# Patient Record
Sex: Female | Born: 1985 | Race: White | Hispanic: No | Marital: Single | Smoking: Current every day smoker
Health system: Southern US, Community
[De-identification: ages and names within clinical notes are randomized; demographics above are authoritative.]

## PROBLEM LIST (undated history)

## (undated) DIAGNOSIS — F1990 Other psychoactive substance use, unspecified, uncomplicated: Secondary | ICD-10-CM

---

## 2018-06-10 NOTE — Congregational Nurse Program (Signed)
Voices no health concerns this visit 

## 2020-11-15 ENCOUNTER — Other Ambulatory Visit: Payer: Self-pay

## 2020-11-15 ENCOUNTER — Inpatient Hospital Stay (HOSPITAL_BASED_OUTPATIENT_CLINIC_OR_DEPARTMENT_OTHER)
Admission: EM | Admit: 2020-11-15 | Discharge: 2020-12-29 | DRG: 853 | Discharge status: Against Medical Advice | Payer: Self-pay | Attending: Internal Medicine | Admitting: Internal Medicine

## 2020-11-15 ENCOUNTER — Emergency Department (HOSPITAL_BASED_OUTPATIENT_CLINIC_OR_DEPARTMENT_OTHER): Payer: Self-pay

## 2020-11-15 ENCOUNTER — Encounter (HOSPITAL_BASED_OUTPATIENT_CLINIC_OR_DEPARTMENT_OTHER): Payer: Self-pay | Admitting: Emergency Medicine

## 2020-11-15 DIAGNOSIS — E875 Hyperkalemia: Secondary | ICD-10-CM | POA: Diagnosis not present

## 2020-11-15 DIAGNOSIS — D696 Thrombocytopenia, unspecified: Secondary | ICD-10-CM | POA: Diagnosis present

## 2020-11-15 DIAGNOSIS — M79672 Pain in left foot: Secondary | ICD-10-CM

## 2020-11-15 DIAGNOSIS — B49 Unspecified mycosis: Secondary | ICD-10-CM

## 2020-11-15 DIAGNOSIS — Z6824 Body mass index (BMI) 24.0-24.9, adult: Secondary | ICD-10-CM

## 2020-11-15 DIAGNOSIS — R0682 Tachypnea, not elsewhere classified: Secondary | ICD-10-CM

## 2020-11-15 DIAGNOSIS — E860 Dehydration: Secondary | ICD-10-CM | POA: Diagnosis present

## 2020-11-15 DIAGNOSIS — Z638 Other specified problems related to primary support group: Secondary | ICD-10-CM

## 2020-11-15 DIAGNOSIS — I129 Hypertensive chronic kidney disease with stage 1 through stage 4 chronic kidney disease, or unspecified chronic kidney disease: Secondary | ICD-10-CM | POA: Diagnosis present

## 2020-11-15 DIAGNOSIS — N170 Acute kidney failure with tubular necrosis: Secondary | ICD-10-CM | POA: Diagnosis present

## 2020-11-15 DIAGNOSIS — L0390 Cellulitis, unspecified: Secondary | ICD-10-CM

## 2020-11-15 DIAGNOSIS — M549 Dorsalgia, unspecified: Secondary | ICD-10-CM | POA: Diagnosis not present

## 2020-11-15 DIAGNOSIS — M00011 Staphylococcal arthritis, right shoulder: Secondary | ICD-10-CM

## 2020-11-15 DIAGNOSIS — N3000 Acute cystitis without hematuria: Secondary | ICD-10-CM

## 2020-11-15 DIAGNOSIS — K5903 Drug induced constipation: Secondary | ICD-10-CM | POA: Diagnosis present

## 2020-11-15 DIAGNOSIS — G43909 Migraine, unspecified, not intractable, without status migrainosus: Secondary | ICD-10-CM | POA: Diagnosis not present

## 2020-11-15 DIAGNOSIS — R7881 Bacteremia: Secondary | ICD-10-CM

## 2020-11-15 DIAGNOSIS — F919 Conduct disorder, unspecified: Secondary | ICD-10-CM | POA: Diagnosis not present

## 2020-11-15 DIAGNOSIS — E876 Hypokalemia: Secondary | ICD-10-CM | POA: Diagnosis present

## 2020-11-15 DIAGNOSIS — D62 Acute posthemorrhagic anemia: Secondary | ICD-10-CM | POA: Diagnosis not present

## 2020-11-15 DIAGNOSIS — B1920 Unspecified viral hepatitis C without hepatic coma: Secondary | ICD-10-CM | POA: Diagnosis present

## 2020-11-15 DIAGNOSIS — F32A Depression, unspecified: Secondary | ICD-10-CM | POA: Diagnosis present

## 2020-11-15 DIAGNOSIS — F17200 Nicotine dependence, unspecified, uncomplicated: Secondary | ICD-10-CM | POA: Diagnosis present

## 2020-11-15 DIAGNOSIS — Z20822 Contact with and (suspected) exposure to covid-19: Secondary | ICD-10-CM | POA: Diagnosis present

## 2020-11-15 DIAGNOSIS — I2690 Septic pulmonary embolism without acute cor pulmonale: Secondary | ICD-10-CM | POA: Diagnosis present

## 2020-11-15 DIAGNOSIS — D509 Iron deficiency anemia, unspecified: Secondary | ICD-10-CM

## 2020-11-15 DIAGNOSIS — M7989 Other specified soft tissue disorders: Secondary | ICD-10-CM | POA: Diagnosis not present

## 2020-11-15 DIAGNOSIS — Z5329 Procedure and treatment not carried out because of patient's decision for other reasons: Secondary | ICD-10-CM | POA: Diagnosis not present

## 2020-11-15 DIAGNOSIS — D6959 Other secondary thrombocytopenia: Secondary | ICD-10-CM | POA: Diagnosis present

## 2020-11-15 DIAGNOSIS — A4102 Sepsis due to Methicillin resistant Staphylococcus aureus: Principal | ICD-10-CM | POA: Diagnosis present

## 2020-11-15 DIAGNOSIS — F1490 Cocaine use, unspecified, uncomplicated: Secondary | ICD-10-CM | POA: Diagnosis present

## 2020-11-15 DIAGNOSIS — R6520 Severe sepsis without septic shock: Secondary | ICD-10-CM | POA: Diagnosis present

## 2020-11-15 DIAGNOSIS — R0600 Dyspnea, unspecified: Secondary | ICD-10-CM

## 2020-11-15 DIAGNOSIS — T40605A Adverse effect of unspecified narcotics, initial encounter: Secondary | ICD-10-CM | POA: Diagnosis present

## 2020-11-15 DIAGNOSIS — E871 Hypo-osmolality and hyponatremia: Secondary | ICD-10-CM | POA: Diagnosis present

## 2020-11-15 DIAGNOSIS — E8770 Fluid overload, unspecified: Secondary | ICD-10-CM | POA: Diagnosis not present

## 2020-11-15 DIAGNOSIS — I76 Septic arterial embolism: Secondary | ICD-10-CM

## 2020-11-15 DIAGNOSIS — N280 Ischemia and infarction of kidney: Secondary | ICD-10-CM | POA: Diagnosis present

## 2020-11-15 DIAGNOSIS — I079 Rheumatic tricuspid valve disease, unspecified: Secondary | ICD-10-CM

## 2020-11-15 DIAGNOSIS — N189 Chronic kidney disease, unspecified: Secondary | ICD-10-CM | POA: Diagnosis present

## 2020-11-15 DIAGNOSIS — B9562 Methicillin resistant Staphylococcus aureus infection as the cause of diseases classified elsewhere: Secondary | ICD-10-CM

## 2020-11-15 DIAGNOSIS — K219 Gastro-esophageal reflux disease without esophagitis: Secondary | ICD-10-CM | POA: Diagnosis present

## 2020-11-15 DIAGNOSIS — J189 Pneumonia, unspecified organism: Secondary | ICD-10-CM

## 2020-11-15 DIAGNOSIS — F1910 Other psychoactive substance abuse, uncomplicated: Secondary | ICD-10-CM | POA: Diagnosis present

## 2020-11-15 DIAGNOSIS — F1290 Cannabis use, unspecified, uncomplicated: Secondary | ICD-10-CM | POA: Diagnosis present

## 2020-11-15 DIAGNOSIS — E46 Unspecified protein-calorie malnutrition: Secondary | ICD-10-CM | POA: Diagnosis present

## 2020-11-15 DIAGNOSIS — A419 Sepsis, unspecified organism: Secondary | ICD-10-CM

## 2020-11-15 DIAGNOSIS — F1123 Opioid dependence with withdrawal: Secondary | ICD-10-CM | POA: Diagnosis present

## 2020-11-15 DIAGNOSIS — R5381 Other malaise: Secondary | ICD-10-CM | POA: Diagnosis present

## 2020-11-15 DIAGNOSIS — R109 Unspecified abdominal pain: Secondary | ICD-10-CM

## 2020-11-15 DIAGNOSIS — J9621 Acute and chronic respiratory failure with hypoxia: Secondary | ICD-10-CM

## 2020-11-15 DIAGNOSIS — E8809 Other disorders of plasma-protein metabolism, not elsewhere classified: Secondary | ICD-10-CM | POA: Diagnosis present

## 2020-11-15 DIAGNOSIS — Z8679 Personal history of other diseases of the circulatory system: Secondary | ICD-10-CM

## 2020-11-15 DIAGNOSIS — F17210 Nicotine dependence, cigarettes, uncomplicated: Secondary | ICD-10-CM | POA: Diagnosis present

## 2020-11-15 DIAGNOSIS — R161 Splenomegaly, not elsewhere classified: Secondary | ICD-10-CM | POA: Diagnosis present

## 2020-11-15 DIAGNOSIS — E43 Unspecified severe protein-calorie malnutrition: Secondary | ICD-10-CM | POA: Diagnosis present

## 2020-11-15 DIAGNOSIS — E440 Moderate protein-calorie malnutrition: Secondary | ICD-10-CM | POA: Insufficient documentation

## 2020-11-15 DIAGNOSIS — F419 Anxiety disorder, unspecified: Secondary | ICD-10-CM | POA: Diagnosis present

## 2020-11-15 DIAGNOSIS — L03116 Cellulitis of left lower limb: Secondary | ICD-10-CM | POA: Diagnosis present

## 2020-11-15 DIAGNOSIS — J188 Other pneumonia, unspecified organism: Secondary | ICD-10-CM | POA: Diagnosis present

## 2020-11-15 DIAGNOSIS — L02413 Cutaneous abscess of right upper limb: Secondary | ICD-10-CM | POA: Diagnosis present

## 2020-11-15 DIAGNOSIS — I330 Acute and subacute infective endocarditis: Secondary | ICD-10-CM | POA: Diagnosis present

## 2020-11-15 DIAGNOSIS — K089 Disorder of teeth and supporting structures, unspecified: Secondary | ICD-10-CM

## 2020-11-15 HISTORY — DX: Other psychoactive substance use, unspecified, uncomplicated: F19.90

## 2020-11-15 LAB — HEPATIC FUNCTION PANEL
ALT: 12 U/L (ref 0–44)
AST: 23 U/L (ref 15–41)
Albumin: 2.1 g/dL — ABNORMAL LOW (ref 3.5–5.0)
Alkaline Phosphatase: 74 U/L (ref 38–126)
Bilirubin, Direct: 0.6 mg/dL — ABNORMAL HIGH (ref 0.0–0.2)
Indirect Bilirubin: 0.3 mg/dL (ref 0.3–0.9)
Total Bilirubin: 0.9 mg/dL (ref 0.3–1.2)
Total Protein: 6.5 g/dL (ref 6.5–8.1)

## 2020-11-15 LAB — URINALYSIS, ROUTINE W REFLEX MICROSCOPIC
Bilirubin Urine: NEGATIVE
Glucose, UA: NEGATIVE mg/dL
Ketones, ur: NEGATIVE mg/dL
Nitrite: POSITIVE — AB
Protein, ur: 30 mg/dL — AB
Specific Gravity, Urine: 1.02 (ref 1.005–1.030)
pH: 6 (ref 5.0–8.0)

## 2020-11-15 LAB — BASIC METABOLIC PANEL
Anion gap: 15 (ref 5–15)
BUN: 40 mg/dL — ABNORMAL HIGH (ref 6–20)
CO2: 19 mmol/L — ABNORMAL LOW (ref 22–32)
Calcium: 7.7 mg/dL — ABNORMAL LOW (ref 8.9–10.3)
Chloride: 90 mmol/L — ABNORMAL LOW (ref 98–111)
Creatinine, Ser: 1.33 mg/dL — ABNORMAL HIGH (ref 0.44–1.00)
GFR, Estimated: 54 mL/min — ABNORMAL LOW (ref >60)
Glucose, Bld: 128 mg/dL — ABNORMAL HIGH (ref 70–99)
Potassium: 2.6 mmol/L — CL (ref 3.5–5.1)
Sodium: 124 mmol/L — ABNORMAL LOW (ref 135–145)

## 2020-11-15 LAB — URINALYSIS, MICROSCOPIC (REFLEX)

## 2020-11-15 LAB — MAGNESIUM: Magnesium: 1.8 mg/dL (ref 1.7–2.4)

## 2020-11-15 LAB — RAPID URINE DRUG SCREEN, HOSP PERFORMED
Amphetamines: NOT DETECTED
Barbiturates: NOT DETECTED
Benzodiazepines: NOT DETECTED
Cocaine: POSITIVE — AB
Opiates: NOT DETECTED
Tetrahydrocannabinol: NOT DETECTED

## 2020-11-15 LAB — CBC
HCT: 36.4 % (ref 36.0–46.0)
Hemoglobin: 13 g/dL (ref 12.0–15.0)
MCH: 27.5 pg (ref 26.0–34.0)
MCHC: 35.7 g/dL (ref 30.0–36.0)
MCV: 77.1 fL — ABNORMAL LOW (ref 80.0–100.0)
Platelets: 51 10*3/uL — ABNORMAL LOW (ref 150–400)
RBC: 4.72 MIL/uL (ref 3.87–5.11)
RDW: 15.3 % (ref 11.5–15.5)
WBC: 15.3 10*3/uL — ABNORMAL HIGH (ref 4.0–10.5)
nRBC: 0 % (ref 0.0–0.2)

## 2020-11-15 LAB — TROPONIN I (HIGH SENSITIVITY)
Troponin I (High Sensitivity): 13 ng/L (ref <18)
Troponin I (High Sensitivity): 12 ng/L (ref <18)

## 2020-11-15 LAB — RESP PANEL BY RT-PCR (FLU A&B, COVID) ARPGX2
Influenza A by PCR: NEGATIVE
Influenza B by PCR: NEGATIVE
SARS Coronavirus 2 by RT PCR: NEGATIVE

## 2020-11-15 LAB — CK: Total CK: 43 U/L (ref 38–234)

## 2020-11-15 LAB — LACTIC ACID, PLASMA
Lactic Acid, Venous: 3.6 mmol/L (ref 0.5–1.9)
Lactic Acid, Venous: 2.7 mmol/L (ref 0.5–1.9)

## 2020-11-15 LAB — LIPASE, BLOOD: Lipase: 20 U/L (ref 11–51)

## 2020-11-15 LAB — CBG MONITORING, ED: Glucose-Capillary: 145 mg/dL — ABNORMAL HIGH (ref 70–99)

## 2020-11-15 LAB — PREGNANCY, URINE: Preg Test, Ur: NEGATIVE

## 2020-11-15 MED ORDER — IOHEXOL 350 MG/ML SOLN
100.0000 mL | Freq: Once | INTRAVENOUS | Status: AC | PRN
  Administered 2020-11-15: 100 mL via INTRAVENOUS

## 2020-11-15 MED ORDER — POTASSIUM CHLORIDE 10 MEQ/100ML IV SOLN
10.0000 meq | INTRAVENOUS | Status: AC
  Administered 2020-11-15 – 2020-11-16 (×4): 10 meq via INTRAVENOUS
  Filled 2020-11-15 (×4): qty 100

## 2020-11-15 MED ORDER — SODIUM CHLORIDE 0.9 % IV SOLN
2.0000 g | Freq: Two times a day (BID) | INTRAVENOUS | Status: DC
  Administered 2020-11-15 – 2020-11-16 (×2): 2 g via INTRAVENOUS
  Filled 2020-11-15 (×2): qty 2

## 2020-11-15 MED ORDER — SODIUM CHLORIDE 0.9 % IV SOLN: Freq: Once | INTRAVENOUS | Status: AC

## 2020-11-15 MED ORDER — SODIUM CHLORIDE 0.9 % IV SOLN
INTRAVENOUS | Status: DC | PRN
  Administered 2020-11-15: 250 mL via INTRAVENOUS

## 2020-11-15 MED ORDER — VANCOMYCIN HCL 1250 MG/250ML IV SOLN
1250.0000 mg | INTRAVENOUS | Status: DC
  Filled 2020-11-15: qty 250

## 2020-11-15 MED ORDER — VANCOMYCIN HCL IN DEXTROSE 1-5 GM/200ML-% IV SOLN
1000.0000 mg | Freq: Once | INTRAVENOUS | Status: AC
  Administered 2020-11-15: 1000 mg via INTRAVENOUS
  Filled 2020-11-15: qty 200

## 2020-11-15 MED ORDER — POTASSIUM CHLORIDE CRYS ER 20 MEQ PO TBCR: 40.0000 meq | EXTENDED_RELEASE_TABLET | Freq: Once | ORAL | Status: DC

## 2020-11-15 MED ORDER — ONDANSETRON HCL 4 MG/2ML IJ SOLN
4.0000 mg | Freq: Once | INTRAMUSCULAR | Status: AC
  Administered 2020-11-15: 4 mg via INTRAVENOUS
  Filled 2020-11-15: qty 2

## 2020-11-15 MED ORDER — SODIUM CHLORIDE 0.9 % IV BOLUS
1000.0000 mL | Freq: Once | INTRAVENOUS | Status: AC
  Administered 2020-11-15: 1000 mL via INTRAVENOUS

## 2020-11-15 MED ORDER — VANCOMYCIN HCL 500 MG IV SOLR
INTRAVENOUS | Status: AC
  Filled 2020-11-15: qty 500

## 2020-11-15 MED ORDER — FENTANYL CITRATE (PF) 100 MCG/2ML IJ SOLN
50.0000 ug | Freq: Once | INTRAMUSCULAR | Status: AC
Start: 2020-11-15 — End: 2020-11-15
  Administered 2020-11-15: 50 ug via INTRAVENOUS
  Filled 2020-11-15: qty 2

## 2020-11-15 MED ORDER — VANCOMYCIN HCL 1500 MG/300ML IV SOLN: 1500.0000 mg | Freq: Once | INTRAVENOUS | Status: DC

## 2020-11-15 MED ORDER — VANCOMYCIN HCL 500 MG/100ML IV SOLN
500.0000 mg | Freq: Once | INTRAVENOUS | Status: AC
  Administered 2020-11-15: 500 mg via INTRAVENOUS
  Filled 2020-11-15: qty 100

## 2020-11-15 NOTE — Progress Notes (Signed)
Pharmacy Antibiotic Note  Rola Lennon is a 35 y.o. female admitted on 11/15/2020 with sepsis.  Pharmacy has been consulted for vancomycin/cefepime dosing.  Presenting with generalized body aches, cough, and nausea. WBC 15.2, LA 3.6, CK total 43, afebrile.   Plan: Vancomycin 1500 mg IV once then 1250 mg IV every 24 hours (est AUC 490, using Scr 1.33) Cefepime 2g IV every 12 hours Monitor renal fx, cx results, clinical pic, vanc levels as indicated   Height: 5\' 7"  (170.2 cm) Weight: 68 kg (150 lb) IBW/kg (Calculated) : 61.6  Temp (24hrs), Avg:98.9 F (37.2 C), Min:98.5 F (36.9 C), Max:99.2 F (37.3 C)  Recent Labs  Lab 11/15/20 1337 11/15/20 1735 11/15/20 1845  WBC 15.3*  --   --   CREATININE  --  1.33*  --   LATICACIDVEN  --   --  3.6*    Estimated Creatinine Clearance: 57.4 mL/min (A) (by C-G formula based on SCr of 1.33 mg/dL (H)).    No Known Allergies   Antimicrobials this admission: Cefepime 2/12 >>  Vancomycin 2/12 >>   Dose adjustments this admission: N/A  Microbiology results: 2/12 BCx: sent 2/12 COVID PCR: neg  Thank you for allowing pharmacy to be a part of this patient's care.  4/12, PharmD, BCCCP Clinical Pharmacist  Phone: [phone] 11/15/2020 7:38 PM  Please check AMION for all [facility] Pharmacy phone numbers After 10:00 PM, call Main Pharmacy [phone]

## 2020-11-15 NOTE — ED Notes (Signed)
Arterial stick done for labs per MD 

## 2020-11-15 NOTE — ED Notes (Signed)
Patient transported to CT 

## 2020-11-15 NOTE — ED Notes (Signed)
Attempted x 2 to left hand for labs, unsuccessful. Per ED MD ok'ed art stick for labs by RT

## 2020-11-15 NOTE — ED Provider Notes (Signed)
[facility] EMERGENCY DEPARTMENT Provider Note   CSN: [id number] Arrival date & time: 11/15/20  1208     History Chief Complaint  Patient presents with  . Generalized Body Aches    Anne Bautista is a 35 y.o. female.  The history is provided by the patient.  Weakness Severity:  Moderate Onset quality:  Gradual Timing:  Constant Progression:  Worsening Chronicity:  New Context: drug use (Hx of heroin use denies any recent use but states cough, left flank pain, SOB and total body weakness and dehydration for the past week.)   Relieved by:  Nothing Worsened by:  Nothing Associated symptoms: anorexia, cough, fever (said she had a 105 fever), lethargy, myalgias and shortness of breath   Associated symptoms: no abdominal pain, no arthralgias, no chest pain, no diarrhea, no dizziness, no dysuria, no seizures and no vomiting        History reviewed. No pertinent past medical history.  There are no problems to display for this patient.   History reviewed. No pertinent surgical history.   OB History   No obstetric history on file.     History reviewed. No pertinent family history.  Social History   Tobacco Use  . Smoking status: Never Smoker  Substance Use Topics  . Alcohol use: Never  . Drug use: Never    Home Medications Prior to Admission medications   Not on File    Allergies    Patient has no known allergies.  Review of Systems   Review of Systems  Constitutional: Positive for chills and fever (said she had a 105 fever).  HENT: Negative for ear pain and sore throat.   Eyes: Negative for pain and visual disturbance.  Respiratory: Positive for cough and shortness of breath.   Cardiovascular: Negative for chest pain and palpitations.  Gastrointestinal: Positive for anorexia. Negative for abdominal pain, diarrhea and vomiting.  Genitourinary: Negative for dysuria and hematuria.  Musculoskeletal: Positive for myalgias. Negative for arthralgias and  back pain.  Skin: Negative for color change and rash.  Neurological: Positive for weakness. Negative for dizziness, seizures and syncope.  All other systems reviewed and are negative.   Physical Exam Updated Vital Signs  ED Triage Vitals  Enc Vitals Group     BP 11/15/20 1227 130/88     Pulse Rate 11/15/20 1227 (!) 138     Resp 11/15/20 1227 (!) 30     Temp 11/15/20 1227 98.5 F (36.9 C)     Temp Source 11/15/20 1227 Oral     SpO2 11/15/20 1217 98 %     Weight 11/15/20 1227 150 lb (68 kg)     Height 11/15/20 1227 5\' 7"  (1.702 m)     Head Circumference --      Peak Flow --      Pain Score 11/15/20 1226 10     Pain Loc --      Pain Edu? --      Excl. in GC? --     Physical Exam Vitals and nursing note reviewed.  Constitutional:      General: She is in acute distress.     Appearance: She is well-developed and well-nourished. She is ill-appearing.  HENT:     Head: Normocephalic and atraumatic.     Mouth/Throat:     Mouth: Mucous membranes are dry.  Eyes:     Extraocular Movements: Extraocular movements intact.     Conjunctiva/sclera: Conjunctivae normal.     Pupils: Pupils are equal, round,  and reactive to light.  Cardiovascular:     Rate and Rhythm: Regular rhythm. Tachycardia present.     Heart sounds: No murmur heard.     Comments: No obvious murmur Pulmonary:     Effort: Pulmonary effort is normal. No respiratory distress.     Breath sounds: Normal breath sounds.  Abdominal:     General: There is no distension.     Palpations: Abdomen is soft.     Tenderness: There is no abdominal tenderness. There is no guarding.  Musculoskeletal:        General: Tenderness present. No edema.     Cervical back: Neck supple.     Comments: Tenderness to the left flank  Skin:    General: Skin is warm and dry.     Capillary Refill: Capillary refill takes less than 2 seconds.     Findings: No rash.  Neurological:     Mental Status: She is alert.     Comments: She appears to  have more generalized weakness and any focal weakness on exam able to move all of her extremities with very poor effort, normal sensation, cranial nerves appear intact, normal ocular ocular movements  Psychiatric:        Mood and Affect: Mood and affect normal.     ED Results / Procedures / Treatments   Labs (all labs ordered are listed, but only abnormal results are displayed) Labs Reviewed  CBC - Abnormal; Notable for the following components:      Result Value   WBC 15.3 (*)    MCV 77.1 (*)    Platelets 51 (*)    All other components within normal limits  URINALYSIS, ROUTINE W REFLEX MICROSCOPIC - Abnormal; Notable for the following components:   APPearance HAZY (*)    Hgb urine dipstick MODERATE (*)    Protein, ur 30 (*)    Nitrite POSITIVE (*)    Leukocytes,Ua TRACE (*)    All other components within normal limits  RAPID URINE DRUG SCREEN, HOSP PERFORMED - Abnormal; Notable for the following components:   Cocaine POSITIVE (*)    All other components within normal limits  BASIC METABOLIC PANEL - Abnormal; Notable for the following components:   Sodium 124 (*)    Potassium 2.6 (*)    Chloride 90 (*)    CO2 19 (*)    Glucose, Bld 128 (*)    BUN 40 (*)    Creatinine, Ser 1.33 (*)    Calcium 7.7 (*)    GFR, Estimated 54 (*)    All other components within normal limits  HEPATIC FUNCTION PANEL - Abnormal; Notable for the following components:   Albumin 2.1 (*)    Bilirubin, Direct 0.6 (*)    All other components within normal limits  LACTIC ACID, PLASMA - Abnormal; Notable for the following components:   Lactic Acid, Venous 3.6 (*)    All other components within normal limits  URINALYSIS, MICROSCOPIC (REFLEX) - Abnormal; Notable for the following components:   Bacteria, UA MANY (*)    All other components within normal limits  CBG MONITORING, ED - Abnormal; Notable for the following components:   Glucose-Capillary 145 (*)    All other components within normal limits   RESP PANEL BY RT-PCR (FLU A&B, COVID) ARPGX2  CULTURE, BLOOD (ROUTINE X 2)  CULTURE, BLOOD (ROUTINE X 2)  PREGNANCY, URINE  CK  LIPASE, BLOOD  MAGNESIUM  LACTIC ACID, PLASMA  TROPONIN I (HIGH SENSITIVITY)  TROPONIN I (HIGH  SENSITIVITY)    EKG EKG Interpretation  Date/Time:  Saturday November 15 2020 12:42:05 EST Ventricular Rate:  137 PR Interval:  118 QRS Duration: 86 QT Interval:  248 QTC Calculation: 374 R Axis:   63 Text Interpretation: Sinus tachycardia Nonspecific ST and T wave abnormality Abnormal ECG Confirmed by Virgina Norfolk [phone]) on 11/15/2020 3:25:24 PM   Radiology CT Head Wo Contrast  Result Date: 11/15/2020 CLINICAL DATA:  Classic migraine headache. EXAM: CT HEAD WITHOUT CONTRAST TECHNIQUE: Contiguous axial images were obtained from the base of the skull through the vertex without intravenous contrast. COMPARISON:  07/08/2015 FINDINGS: Brain: The brain shows a normal appearance without evidence of malformation, atrophy, old or acute small or large vessel infarction, mass lesion, hemorrhage, hydrocephalus or extra-axial collection. Vascular: No hyperdense vessel. No evidence of atherosclerotic calcification. Skull: Normal.  No traumatic finding.  No focal bone lesion. Sinuses/Orbits: Air-fluid level in the right maxillary sinus suggesting rhinosinusitis. Other visualized sinuses are clear. Orbits negative. Other: None significant IMPRESSION: 1. Normal appearance of the brain itself. 2. Air-fluid level in the right maxillary sinus suggesting rhinosinusitis. Electronically Signed   By: Paulina Fusi M.D.   On: 11/15/2020 20:45   CT Angio Chest PE W and/or Wo Contrast  Result Date: 11/15/2020 CLINICAL DATA:  Body aches, weakness, cough for 1 week EXAM: CT ANGIOGRAPHY CHEST WITH CONTRAST TECHNIQUE: Multidetector CT imaging of the chest was performed using the standard protocol during bolus administration of intravenous contrast. Multiplanar CT image reconstructions and  MIPs were obtained to evaluate the vascular anatomy. CONTRAST:  OMNIPAQUE IOHEXOL 350 MG/ML SOLN COMPARISON:  04/16/2016 FINDINGS: Cardiovascular: Satisfactory opacification of the pulmonary arteries to the segmental level. No evidence of pulmonary embolism. Normal heart size. No pericardial effusion. Mediastinum/Nodes: No enlarged mediastinal, hilar, or axillary lymph nodes. Thyroid gland, trachea, and esophagus demonstrate no significant findings. Lungs/Pleura: There are numerous bilateral cavitary nodules throughout the lungs. Interlobular septal thickening. Small left, trace right pleural effusions and associated atelectasis or consolidation. Upper Abdomen: Please see separately reported examination of the abdomen. Musculoskeletal: No chest wall abnormality. No acute or significant osseous findings. Review of the MIP images confirms the above findings. IMPRESSION: 1. Negative examination for pulmonary embolism. 2. There are numerous bilateral cavitary nodules throughout the lungs. These are most consistent with septic emboli; vasculitis and metastatic disease general differential considerations. 3. Small left, trace right pleural effusions and associated atelectasis or consolidation. Electronically Signed   By: Lauralyn Primes M.D.   On: 11/15/2020 20:42   CT ABDOMEN PELVIS W CONTRAST  Result Date: 11/15/2020 CLINICAL DATA:  Abdominal distension, left flank pain EXAM: CT ABDOMEN AND PELVIS WITH CONTRAST TECHNIQUE: Multidetector CT imaging of the abdomen and pelvis was performed using the standard protocol following bolus administration of intravenous contrast. CONTRAST:  OMNIPAQUE IOHEXOL 350 MG/ML SOLN COMPARISON:  03/31/2016 FINDINGS: Lower chest: Please see separately reported examination of the chest. Hepatobiliary: No solid liver abnormality is seen. No gallstones, gallbladder wall thickening, or biliary dilatation. Pancreas: Unremarkable. No pancreatic ductal dilatation or surrounding  inflammatory changes. Spleen: Splenomegaly, maximum coronal span 17.4 cm. Adrenals/Urinary Tract: Adrenal glands are unremarkable. There are multiple subtly hypoenhancing lesions of the renal cortices. No hydronephrosis. Bladder is unremarkable. Stomach/Bowel: Stomach is within normal limits. Appendix appears normal. No evidence of bowel wall thickening, distention, or inflammatory changes. Vascular/Lymphatic: No significant vascular findings are present. No enlarged abdominal or pelvic lymph nodes. Reproductive: No mass or other significant abnormality. Other: No abdominal wall hernia or abnormality. No  abdominopelvic ascites. Musculoskeletal: No acute or significant osseous findings. IMPRESSION: 1. There are multiple subtly hypoenhancing lesions of the renal cortices. Findings are most in keeping with septic emboli. 2. Splenomegaly, maximum coronal span 17.4 cm. Electronically Signed   By: Lauralyn Primes M.D.   On: 11/15/2020 20:45   DG Chest Portable 1 View  Result Date: 11/15/2020 CLINICAL DATA:  Weakness. Generalized body aches and cough for 1 week. Nausea. EXAM: PORTABLE CHEST 1 VIEW COMPARISON:  04/09/2016 FINDINGS: Cardiac silhouette is normal in size. Normal mediastinal and hilar contours. There are focal opacities in both lungs that are similar to the prior chest radiograph. Current opacities are likely due to residual scarring from the previous septic emboli. Areas of acute infection, however, are not excluded. Mild reticular opacity at the left lung base is noted consist subsegmental atelectasis or scarring. There are prominent bronchovascular markings, stable. No evidence of pulmonary edema. No convincing pleural effusion or pneumothorax. Skeletal structures are grossly intact. IMPRESSION: 1. Bilateral lung opacities that likely residual scarring from previous septic emboli as noted on the exams from 04/03/2016. Acute areas infection/inflammation excluded, but felt less likely. No evidence of  pulmonary edema. Electronically Signed   By: Amie Portland M.D.   On: 11/15/2020 15:53    Procedures .Critical Care Performed by: Virgina Norfolk, DO Authorized by: Virgina Norfolk, DO   Critical care provider statement:    Critical care time (minutes):  45   Critical care was necessary to treat or prevent imminent or life-threatening deterioration of the following conditions:  Sepsis   Critical care was time spent personally by me on the following activities:  Blood draw for specimens, development of treatment plan with patient or surrogate, discussions with primary provider, evaluation of patient's response to treatment, examination of patient, obtaining history from patient or surrogate, ordering and performing treatments and interventions, ordering and review of radiographic studies, ordering and review of laboratory studies, pulse oximetry, re-evaluation of patient's condition and review of old charts   I assumed direction of critical care for this patient from another provider in my specialty: no       Medications Ordered in ED Medications  potassium chloride 10 mEq in 100 mL IVPB (10 mEq Intravenous New Bag/Given 11/15/20 2055)  potassium chloride SA (KLOR-CON) CR tablet 40 mEq (0 mEq Oral Hold 11/15/20 2057)  vancomycin (VANCOREADY) IVPB 1250 mg/250 mL (has no administration in time range)  ceFEPIme (MAXIPIME) 2 g in sodium chloride 0.9 % 100 mL IVPB (2 g Intravenous New Bag/Given 11/15/20 2057)  vancomycin (VANCOCIN) IVPB 1000 mg/200 mL premix (has no administration in time range)    And  vancomycin (VANCOREADY) IVPB 500 mg/100 mL (has no administration in time range)  0.9 %  sodium chloride infusion (250 mLs Intravenous New Bag/Given 11/15/20 2051)  sodium chloride 0.9 % bolus 1,000 mL ( Intravenous Stopped 11/15/20 1759)  ondansetron (ZOFRAN) injection 4 mg (4 mg Intravenous Given 11/15/20 1638)  sodium chloride 0.9 % bolus 1,000 mL ( Intravenous Stopped 11/15/20 1948)  fentaNYL  (SUBLIMAZE) injection 50 mcg (50 mcg Intravenous Given 11/15/20 1845)  iohexol (OMNIPAQUE) 350 MG/ML injection 100 mL (100 mLs Intravenous Contrast Given 11/15/20 2023)  0.9 %  sodium chloride infusion ( Intravenous New Bag/Given 11/15/20 2049)    ED Course  I have reviewed the triage vital signs and the nursing notes.  Pertinent labs & imaging results that were available during my care of the patient were reviewed by me and considered in my medical decision  making (see chart for details).    MDM Rules/Calculators/A&P                          Milbert CoulterLaura Villavicencio is a 35 year old female who presents to the ED with body aches.  Patient with low-grade temperature, tachycardia, tachypnea.  History of IV drug use.  Denies any recent use but has told different people different things about IV drug use recently.  Chest x-ray shows old evidence of septic emboli.  Sepsis work-up initiated given generalized body aches, low-grade temperature and tachycardia.  She appears clinically dehydrated on exam.  Not sure if she is having any withdrawal symptoms as well.  Having diffuse pain throughout but having significant left flank pain.  Will pursue septic work-up.  Will start broad-spectrum IV antibiotics.  Will get CT scan of head, chest, abdomen, pelvis to further evaluate for septic emboli.  Patient given IV fluid bolus x2.  Given IV Zofran.  Given IV fentanyl for possible withdrawals.  Patient has white count of 15.  Lactic acid of 3.6.  Urinalysis consistent with infection as well.  Also has hyponatremia at 123.  Mild AKI.  CT scan of the head unremarkable.  CT scan shows changes consistent with possible septic emboli versus vasculitis versus metastatic disease.  Similar findings in the renal cortices findings most keeping with septic emboli as well.  Overall suspect patient at high risk for blood infection.  Broad-spectrum IV antibiotics have been started.  Will admit to medicine for further care.  This chart was  dictated using voice recognition software.  Despite best efforts to proofread,  errors can occur which can change the documentation meaning.    Final Clinical Impression(s) / ED Diagnoses Final diagnoses:  Septic embolism (HCC)  Acute cystitis without hematuria  Sepsis, due to unspecified organism, unspecified whether acute organ dysfunction present [facility](HCC)    Rx / DC Orders ED Discharge Orders    None       Virgina NorfolkCuratolo, Jamilynn Whitacre, DO 11/15/20 2120

## 2020-11-15 NOTE — ED Notes (Signed)
Date and time results received: 11/15/20 1838  Test: Potassium Critical Value: 2.8  Name of Provider Notified: Curatolo Orders Received? Or Actions Taken?: no orders given

## 2020-11-15 NOTE — ED Notes (Signed)
Attempted iv /labs x 3; able to get some blood, but no IV

## 2020-11-15 NOTE — ED Triage Notes (Signed)
Pt arrives via EMS with c/o generalized body aches, cough x 1 week. Pt endorses nausea. Pt endorses HA. HR 138 in triage. Pt reports inability to walk, moved arms.

## 2020-11-16 ENCOUNTER — Inpatient Hospital Stay (HOSPITAL_COMMUNITY): Payer: Self-pay

## 2020-11-16 ENCOUNTER — Encounter (HOSPITAL_COMMUNITY): Payer: Self-pay | Admitting: Internal Medicine

## 2020-11-16 DIAGNOSIS — R6520 Severe sepsis without septic shock: Secondary | ICD-10-CM

## 2020-11-16 DIAGNOSIS — F17200 Nicotine dependence, unspecified, uncomplicated: Secondary | ICD-10-CM

## 2020-11-16 DIAGNOSIS — E46 Unspecified protein-calorie malnutrition: Secondary | ICD-10-CM

## 2020-11-16 DIAGNOSIS — A4102 Sepsis due to Methicillin resistant Staphylococcus aureus: Principal | ICD-10-CM

## 2020-11-16 DIAGNOSIS — K089 Disorder of teeth and supporting structures, unspecified: Secondary | ICD-10-CM

## 2020-11-16 DIAGNOSIS — F1910 Other psychoactive substance abuse, uncomplicated: Secondary | ICD-10-CM | POA: Diagnosis present

## 2020-11-16 DIAGNOSIS — D696 Thrombocytopenia, unspecified: Secondary | ICD-10-CM | POA: Diagnosis present

## 2020-11-16 HISTORY — DX: Disorder of teeth and supporting structures, unspecified: K08.9

## 2020-11-16 HISTORY — DX: Nicotine dependence, unspecified, uncomplicated: F17.200

## 2020-11-16 HISTORY — DX: Unspecified protein-calorie malnutrition: E46

## 2020-11-16 LAB — BLOOD CULTURE ID PANEL (REFLEXED) - BCID2

## 2020-11-16 LAB — BASIC METABOLIC PANEL
Anion gap: 9 (ref 5–15)
BUN: 22 mg/dL — ABNORMAL HIGH (ref 6–20)
CO2: 18 mmol/L — ABNORMAL LOW (ref 22–32)
Calcium: 7.4 mg/dL — ABNORMAL LOW (ref 8.9–10.3)
Chloride: 101 mmol/L (ref 98–111)
Creatinine, Ser: 0.92 mg/dL (ref 0.44–1.00)
GFR, Estimated: >60 mL/min (ref >60)
Glucose, Bld: 131 mg/dL — ABNORMAL HIGH (ref 70–99)
Potassium: 4.2 mmol/L (ref 3.5–5.1)
Sodium: 128 mmol/L — ABNORMAL LOW (ref 135–145)

## 2020-11-16 LAB — COMPREHENSIVE METABOLIC PANEL
ALT: 11 U/L (ref 0–44)
AST: 27 U/L (ref 15–41)
Albumin: 1.7 g/dL — ABNORMAL LOW (ref 3.5–5.0)
Alkaline Phosphatase: 71 U/L (ref 38–126)
Anion gap: 11 (ref 5–15)
BUN: 25 mg/dL — ABNORMAL HIGH (ref 6–20)
CO2: 16 mmol/L — ABNORMAL LOW (ref 22–32)
Calcium: 7 mg/dL — ABNORMAL LOW (ref 8.9–10.3)
Chloride: 103 mmol/L (ref 98–111)
Creatinine, Ser: 0.84 mg/dL (ref 0.44–1.00)
GFR, Estimated: >60 mL/min (ref >60)
Glucose, Bld: 119 mg/dL — ABNORMAL HIGH (ref 70–99)
Potassium: 2.5 mmol/L — CL (ref 3.5–5.1)
Sodium: 130 mmol/L — ABNORMAL LOW (ref 135–145)
Total Bilirubin: 1.7 mg/dL — ABNORMAL HIGH (ref 0.3–1.2)
Total Protein: 5.7 g/dL — ABNORMAL LOW (ref 6.5–8.1)

## 2020-11-16 LAB — LACTIC ACID, PLASMA
Lactic Acid, Venous: 3.2 mmol/L (ref 0.5–1.9)
Lactic Acid, Venous: 3.6 mmol/L (ref 0.5–1.9)
Lactic Acid, Venous: 3.1 mmol/L (ref 0.5–1.9)

## 2020-11-16 LAB — HIV ANTIBODY (ROUTINE TESTING W REFLEX): HIV Screen 4th Generation wRfx: NONREACTIVE

## 2020-11-16 LAB — PROCALCITONIN: Procalcitonin: 1.97 ng/mL

## 2020-11-16 LAB — MAGNESIUM: Magnesium: 1.9 mg/dL (ref 1.7–2.4)

## 2020-11-16 MED ORDER — HYDROXYZINE HCL 25 MG PO TABS
25.0000 mg | ORAL_TABLET | Freq: Four times a day (QID) | ORAL | Status: DC | PRN
  Administered 2020-11-16: 25 mg via ORAL
  Filled 2020-11-16: qty 1

## 2020-11-16 MED ORDER — ADULT MULTIVITAMIN W/MINERALS CH
1.0000 | ORAL_TABLET | Freq: Every day | ORAL | Status: DC
  Administered 2020-11-16 – 2020-11-17 (×2): 1 via ORAL
  Filled 2020-11-16 (×2): qty 1

## 2020-11-16 MED ORDER — LACTATED RINGERS IV BOLUS
1000.0000 mL | Freq: Once | INTRAVENOUS | Status: AC
  Administered 2020-11-16: 1000 mL via INTRAVENOUS

## 2020-11-16 MED ORDER — MORPHINE SULFATE (PF) 4 MG/ML IV SOLN
4.0000 mg | INTRAVENOUS | Status: DC | PRN
  Administered 2020-11-16 (×4): 4 mg via INTRAVENOUS
  Filled 2020-11-16 (×4): qty 1

## 2020-11-16 MED ORDER — NAPROXEN 250 MG PO TABS
500.0000 mg | ORAL_TABLET | Freq: Two times a day (BID) | ORAL | Status: DC | PRN
  Administered 2020-11-17 – 2020-11-18 (×2): 500 mg via ORAL
  Filled 2020-11-16 (×4): qty 2

## 2020-11-16 MED ORDER — ACETAMINOPHEN 325 MG PO TABS
650.0000 mg | ORAL_TABLET | Freq: Four times a day (QID) | ORAL | Status: DC | PRN
  Administered 2020-11-16 – 2020-12-10 (×11): 650 mg via ORAL
  Filled 2020-11-16 (×11): qty 2

## 2020-11-16 MED ORDER — BUPRENORPHINE HCL-NALOXONE HCL 2-0.5 MG SL SUBL
1.0000 | SUBLINGUAL_TABLET | SUBLINGUAL | Status: DC | PRN
  Administered 2020-11-16 – 2020-11-17 (×4): 1 via SUBLINGUAL
  Filled 2020-11-16 (×4): qty 1

## 2020-11-16 MED ORDER — ACETAMINOPHEN 650 MG RE SUPP: 650.0000 mg | Freq: Four times a day (QID) | RECTAL | Status: DC | PRN

## 2020-11-16 MED ORDER — POTASSIUM CHLORIDE CRYS ER 20 MEQ PO TBCR
40.0000 meq | EXTENDED_RELEASE_TABLET | Freq: Once | ORAL | Status: AC
  Administered 2020-11-16: 40 meq via ORAL
  Filled 2020-11-16: qty 2

## 2020-11-16 MED ORDER — CLONIDINE HCL 0.2 MG PO TABS: 0.1000 mg | ORAL_TABLET | Freq: Every day | ORAL | Status: DC

## 2020-11-16 MED ORDER — DICYCLOMINE HCL 20 MG PO TABS
20.0000 mg | ORAL_TABLET | Freq: Four times a day (QID) | ORAL | Status: DC | PRN
  Filled 2020-11-16: qty 1

## 2020-11-16 MED ORDER — FOLIC ACID 1 MG PO TABS
1.0000 mg | ORAL_TABLET | Freq: Every day | ORAL | Status: DC
  Administered 2020-11-16 – 2020-12-29 (×41): 1 mg via ORAL
  Filled 2020-11-16 (×41): qty 1

## 2020-11-16 MED ORDER — SODIUM CHLORIDE 0.9 % IV BOLUS
1000.0000 mL | Freq: Once | INTRAVENOUS | Status: AC
  Administered 2020-11-16: 1000 mL via INTRAVENOUS

## 2020-11-16 MED ORDER — LACTATED RINGERS IV SOLN: INTRAVENOUS | Status: DC

## 2020-11-16 MED ORDER — SODIUM CHLORIDE 0.9% FLUSH
3.0000 mL | Freq: Two times a day (BID) | INTRAVENOUS | Status: DC
  Administered 2020-11-16 – 2020-12-05 (×27): 3 mL via INTRAVENOUS

## 2020-11-16 MED ORDER — LACTATED RINGERS IV BOLUS (SEPSIS)
1000.0000 mL | Freq: Once | INTRAVENOUS | Status: AC
  Administered 2020-11-16: 1000 mL via INTRAVENOUS

## 2020-11-16 MED ORDER — VANCOMYCIN HCL IN DEXTROSE 1-5 GM/200ML-% IV SOLN
1000.0000 mg | Freq: Two times a day (BID) | INTRAVENOUS | Status: DC
  Administered 2020-11-16 – 2020-11-18 (×6): 1000 mg via INTRAVENOUS
  Filled 2020-11-16 (×8): qty 200

## 2020-11-16 MED ORDER — CLONIDINE HCL 0.2 MG PO TABS
0.1000 mg | ORAL_TABLET | ORAL | Status: DC
  Filled 2020-11-16: qty 1

## 2020-11-16 MED ORDER — METRONIDAZOLE IN NACL 5-0.79 MG/ML-% IV SOLN
500.0000 mg | Freq: Once | INTRAVENOUS | Status: AC
  Administered 2020-11-16: 500 mg via INTRAVENOUS
  Filled 2020-11-16: qty 100

## 2020-11-16 MED ORDER — NICOTINE 14 MG/24HR TD PT24: 14.0000 mg | MEDICATED_PATCH | Freq: Every day | TRANSDERMAL | Status: DC | PRN

## 2020-11-16 MED ORDER — ONDANSETRON 4 MG PO TBDP
4.0000 mg | ORAL_TABLET | Freq: Four times a day (QID) | ORAL | Status: DC | PRN
  Filled 2020-11-16: qty 1

## 2020-11-16 MED ORDER — LORAZEPAM 2 MG/ML IJ SOLN
1.0000 mg | Freq: Once | INTRAMUSCULAR | Status: AC
  Administered 2020-11-16: 1 mg via INTRAVENOUS
  Filled 2020-11-16: qty 1

## 2020-11-16 MED ORDER — BUPRENORPHINE HCL-NALOXONE HCL 8-2 MG SL SUBL: 1.0000 | SUBLINGUAL_TABLET | Freq: Two times a day (BID) | SUBLINGUAL | Status: DC

## 2020-11-16 MED ORDER — THIAMINE HCL 100 MG PO TABS
100.0000 mg | ORAL_TABLET | Freq: Every day | ORAL | Status: DC
  Administered 2020-11-16 – 2020-12-29 (×41): 100 mg via ORAL
  Filled 2020-11-16 (×42): qty 1

## 2020-11-16 MED ORDER — LOPERAMIDE HCL 2 MG PO CAPS
2.0000 mg | ORAL_CAPSULE | ORAL | Status: DC | PRN
  Filled 2020-11-16: qty 2

## 2020-11-16 MED ORDER — POTASSIUM CHLORIDE 10 MEQ/100ML IV SOLN
10.0000 meq | INTRAVENOUS | Status: AC
  Administered 2020-11-16 (×6): 10 meq via INTRAVENOUS
  Filled 2020-11-16 (×6): qty 100

## 2020-11-16 MED ORDER — CLONIDINE HCL 0.1 MG PO TABS
0.1000 mg | ORAL_TABLET | Freq: Four times a day (QID) | ORAL | Status: AC
  Administered 2020-11-16 – 2020-11-18 (×8): 0.1 mg via ORAL
  Filled 2020-11-16: qty 1
  Filled 2020-11-16: qty 0.5
  Filled 2020-11-16 (×7): qty 1

## 2020-11-16 MED ORDER — METHOCARBAMOL 500 MG PO TABS
500.0000 mg | ORAL_TABLET | Freq: Three times a day (TID) | ORAL | Status: DC | PRN
  Administered 2020-11-16: 500 mg via ORAL
  Filled 2020-11-16: qty 1

## 2020-11-16 NOTE — ED Notes (Signed)
She leaves our facility via [facility] at this time. I medicate her for pain prior to transfer.

## 2020-11-16 NOTE — ED Notes (Signed)
Blood culture results given to EDP

## 2020-11-16 NOTE — Progress Notes (Signed)
   A/P: 35yo F with fever, cough, malaise, myalgia x 1 week found to be tachycardic, tachypneic, with leukocytosis, elevated LA. Imaging of lungs showing numerous cavitary lesions concerning for septic emboli. Infectious work up showing blood cx + MRSA.  - please admit for management of MRSA bacteremia, rule out endocarditis -recommend to narrow abtx to vancomycin - see recs below - we will see when she gets admitted to [facility]/or [facility]      [place] Antimicrobial Management Team Staphylococcus aureus bacteremia   Staphylococcus aureus bacteremia (SAB) is associated with a high rate of complications and mortality.  Specific aspects of clinical management are critical to optimizing the outcome of patients with SAB.  Therefore, the [facility] Antimicrobial Management Team [facility]) has initiated an intervention aimed at improving the management of SAB at [facility].  To do so, Infectious Diseases physicians are providing an evidence-based consult for the management of all patients with SAB.     Yes No Comments  Perform follow-up blood cultures (even if the patient is afebrile) to ensure clearance of bacteremia []  []  Please repeat blood cx on 2/14  Remove vascular catheter and obtain follow-up blood cultures after the removal of the catheter []  []    Perform echocardiography to evaluate for endocarditis (transthoracic ECHO is 40-50% sensitive, TEE is > 90% sensitive) []  []  Please keep in mind, that neither test can definitively EXCLUDE endocarditis, and that should clinical suspicion remain high for endocarditis the patient should then still be treated with an "endocarditis" duration of therapy = 6 weeks       Ensure source control []  []  Have all abscesses been drained effectively? Have deep seeded infections (septic joints or osteomyelitis) had appropriate surgical debridement?  Investigate for "metastatic" sites of infection [x]  []  Does the patient have ANY symptom or physical exam finding that would  suggest a deeper infection (back or neck pain that may be suggestive of vertebral osteomyelitis or epidural abscess, muscle pain that could be a symptom of pyomyositis)?  Keep in mind that for deep seeded infections MRI imaging with contrast is preferred rather than other often insensitive tests such as plain x-rays, especially early in a patient's presentation.  Change antibiotic therapy to ___vancomycin__ []  []  Beta-lactam antibiotics are preferred for MSSA due to higher cure rates.   If on Vancomycin, goal trough should be 15 - 20 mcg/mL  Estimated duration of IV antibiotic therapy:  @least  4 wk []  []  Consult case management for probably prolonged outpatient IV antibiotic therapy

## 2020-11-16 NOTE — Progress Notes (Signed)
   11/16/20 1754  Assess: MEWS Score  Temp (!) 100.5 F (38.1 C)  BP 133/82  Pulse Rate (!) 133  SpO2 97 %  O2 Device Room Air  Assess: MEWS Score  MEWS Temp 1  MEWS Systolic 0  MEWS Pulse 3  MEWS RR 0  MEWS LOC 0  MEWS Score 4  MEWS Score Color Red  Assess: if the MEWS score is Yellow or Red  Were vital signs taken at a resting state? Yes  Focused Assessment No change from prior assessment  Early Detection of Sepsis Score *See Row Information* High  MEWS guidelines implemented *See Row Information* Yes  Treat  MEWS Interventions Administered scheduled meds/treatments  Pain Scale 0-10  Pain Score 10  Multiple Pain Sites Yes  Take Vital Signs  Increase Vital Sign Frequency  Red: Q 1hr X 4 then Q 4hr X 4, if remains red, continue Q 4hrs  Escalate  MEWS: Escalate Red: discuss with charge nurse/RN and provider, consider discussing with RRT  Notify: Charge Nurse/RN  Name of Charge Nurse/RN Notified Dianna  Date Charge Nurse/RN Notified 11/16/20  Time Charge Nurse/RN Notified 1800  Notify: Provider  Provider Name/Title Jonah Blue  Date Provider Notified 11/16/20  Time Provider Notified 1630  Notification Type Page  Notification Reason Change in status  Response See new orders  Date of Provider Response 11/16/20  Time of Provider Response 1635  Document  Patient Outcome Not stable and remains on department  Progress note created (see row info) Yes   MD aware, continue current treatment and Q1hour vitals

## 2020-11-16 NOTE — Consult Note (Signed)
[facility]  Total days of antibiotics 2         Reason for Consult: MRSA bacteremia   Referring Physician: yates  Active Problems:   Severe sepsis [facility])    HPI: Anne Bautista is a 35 y.o. female an IV drug consumer who was admitted for malaise, fever. Arthralgias and shortness of breath for a week. On admit, she was found to have tachypneic, with leukocytosis, elevated LA. Imaging of lungs showing numerous cavitary lesions concerning for septic emboli. Infectious work up showing blood cx + MRSA. She was initially started on vancomycin and cefepime, now narrowed to vancomycin. Patient feeling poorly. Has hx of endocarditis roughly 4 years ago but only completed 3-4 wks. She denies active injection use but has recent scarring to hands and AC of left arm  Past Medical History:  Diagnosis Date  . IV drug user    Allergies: No Known Allergies  MEDICATIONS: . [START ON 11/17/2020] buprenorphine-naloxone  1 tablet Sublingual BID  . cloNIDine  0.1 mg Oral QID   Followed by  . [START ON 11/18/2020] cloNIDine  0.1 mg Oral BH-qamhs   Followed by  . [START ON 11/21/2020] cloNIDine  0.1 mg Oral QAC breakfast  . folic acid  1 mg Oral Daily  . multivitamin with minerals  1 tablet Oral Daily  . sodium chloride flush  3 mL Intravenous Q12H  . thiamine  100 mg Oral Daily    Social History   Tobacco Use  . Smoking status: Current Every Day Smoker    Packs/day: 1.00    Years: 18.00    Pack years: 18.00    Types: Cigarettes  . Smokeless tobacco: Never Used  . Tobacco comment: last used 2 weeks ago  Substance Use Topics  . Alcohol use: Never  . Drug use: Yes    Types: Cocaine, Marijuana    Comment: daily use; heroin previously and 3 days ago; occasional marijuana    History reviewed. No pertinent family history.  Review of Systems -   Constitutional: positive for fever, chills, diaphoresis, activity change, appetite change, fatigue and unexpected weight  change.  HENT: Negative for congestion, sore throat, rhinorrhea, sneezing, trouble swallowing and sinus pressure.  Eyes: Negative for photophobia and visual disturbance.  Respiratory: Negative for cough, chest tightness, shortness of breath, wheezing and stridor.  Cardiovascular: Negative for chest pain, palpitations and leg swelling.  Gastrointestinal: Negative for nausea, vomiting, abdominal pain, diarrhea, constipation, blood in stool, abdominal distention and anal bleeding.  Genitourinary: Negative for dysuria, hematuria, flank pain and difficulty urinating.  Musculoskeletal: Negative for myalgias, back pain, joint swelling, arthralgias and gait problem.  Skin: Negative for color change, pallor, rash and wound.  Neurological: Negative for dizziness, tremors, weakness and light-headedness.  Hematological: Negative for adenopathy. Does not bruise/bleed easily.  Psychiatric/Behavioral: Negative for behavioral problems, confusion, sleep disturbance, dysphoric mood, decreased concentration and agitation.      OBJECTIVE: Temp:  [98 F (36.7 C)-99.2 F (37.3 C)] 98.8 F (37.1 C) (02/13 1150) Pulse Rate:  [107-140] 132 (02/13 1150) Resp:  [18-49] 18 (02/13 1150) BP: (101-145)/(68-108) 124/69 (02/13 1150) SpO2:  [94 %-100 %] 98 % (02/13 1150) Weight:  [71.5 kg] 71.5 kg (02/13 1147) Physical Exam  Constitutional:  oriented to person, place, and time. appears well-developed and well-nourished. No distress.  HENT: [place]/AT, PERRLA, no scleral icterus Mouth/Throat: Oropharynx is clear and moist. No oropharyngeal exudate.  Cardiovascular: Normal rate, regular rhythm and normal heart sounds. Exam reveals  no gallop and no friction rub.  No murmur heard.  Pulmonary/Chest: Effort normal and breath sounds normal. No respiratory distress.  has no wheezes.  Neck = supple, no nuchal rigidity Abdominal: Soft. Bowel sounds are normal.  exhibits no distension. There is no tenderness.  Lymphadenopathy: no  cervical adenopathy. No axillary adenopathy Neurological: alert and oriented to person, place, and time. Limited by pain to lift arms above head Skin: Scarring to hands,wrist, and AC of left arm Psychiatric: a normal mood and affect.  behavior is normal.    LABS: Results for orders placed or performed during the hospital encounter of 11/15/20 (from the past 48 hour(s))  CBC     Status: Abnormal   Collection Time: 11/15/20  1:37 PM  Result Value Ref Range   WBC 15.3 (H) 4.0 - 10.5 K/uL   RBC 4.72 3.87 - 5.11 MIL/uL   Hemoglobin 13.0 12.0 - 15.0 g/dL   HCT 81.1 91.4 - 78.2 %   MCV 77.1 (L) 80.0 - 100.0 fL   MCH 27.5 26.0 - 34.0 pg   MCHC 35.7 30.0 - 36.0 g/dL   RDW 95.6 21.3 - 08.6 %   Platelets 51 (L) 150 - 400 K/uL    Comment: PLATELET COUNT CONFIRMED BY SMEAR SPECIMEN CHECKED FOR CLOTS Immature Platelet Fraction may be clinically indicated, consider ordering this additional test [id number]    nRBC 0.0 0.0 - 0.2 %    Comment: Performed at [facility], [address] [place], [place] [address]  Troponin I (High Sensitivity)     Status: None   Collection Time: 11/15/20  1:37 PM  Result Value Ref Range   Troponin I (High Sensitivity) 13 <18 ng/L    Comment: (NOTE) Elevated high sensitivity troponin I (hsTnI) values and significant  changes across serial measurements may suggest ACS but many other  chronic and acute conditions are known to elevate hsTnI results.  Refer to the "Links" section for chest pain algorithms and additional  guidance. Performed at [facility], [address] [place], [place] [address]   CBG monitoring, ED     Status: Abnormal   Collection Time: 11/15/20  3:35 PM  Result Value Ref Range   Glucose-Capillary 145 (H) 70 - 99 mg/dL    Comment: Glucose reference range applies only to samples taken after fasting for at least 8 hours.  Troponin I (High Sensitivity)     Status: None   Collection Time: 11/15/20  4:10 PM  Result Value  Ref Range   Troponin I (High Sensitivity) 12 <18 ng/L    Comment: (NOTE) Elevated high sensitivity troponin I (hsTnI) values and significant  changes across serial measurements may suggest ACS but many other  chronic and acute conditions are known to elevate hsTnI results.  Refer to the "Links" section for chest pain algorithms and additional  guidance. Performed at [facility], [address] [place], [place] [address]   Resp Panel by RT-PCR (Flu A&B, Covid) Peripheral     Status: None   Collection Time: 11/15/20  4:10 PM   Specimen: Peripheral; Nasopharyngeal(NP) swabs in vial transport medium  Result Value Ref Range   SARS Coronavirus 2 by RT PCR NEGATIVE NEGATIVE    Comment: (NOTE) SARS-CoV-2 target nucleic acids are NOT DETECTED.  The SARS-CoV-2 RNA is generally detectable in upper respiratory specimens during the acute phase of infection. The lowest concentration of SARS-CoV-2 viral copies this assay can detect is 138 copies/mL.  A negative result does not preclude SARS-Cov-2 infection and should not be used as the sole basis for treatment or other patient management decisions. A negative result may occur with  improper specimen collection/handling, submission of specimen other than nasopharyngeal swab, presence of viral mutation(s) within the areas targeted by this assay, and inadequate number of viral copies(<138 copies/mL). A negative result must be combined with clinical observations, patient history, and epidemiological information. The expected result is Negative.  Fact Sheet for Patients:  BloggerCourse.com  Fact Sheet for Healthcare Providers:  SeriousBroker.it  This test is no t yet approved or cleared by the [place] FDA and  has been authorized for detection and/or diagnosis of SARS-CoV-2 by FDA under an Emergency Use Authorization (EUA). This EUA will remain  in effect (meaning this test can  be used) for the duration of the COVID-19 declaration under Section 564(b)(1) of the Act, 21 U.S.C.section 360bbb-3(b)(1), unless the authorization is terminated  or revoked sooner.       Influenza A by PCR NEGATIVE NEGATIVE   Influenza B by PCR NEGATIVE NEGATIVE    Comment: (NOTE) The Xpert Xpress SARS-CoV-2/FLU/RSV plus assay is intended as an aid in the diagnosis of influenza from Nasopharyngeal swab specimens and should not be used as a sole basis for treatment. Nasal washings and aspirates are unacceptable for Xpert Xpress SARS-CoV-2/FLU/RSV testing.  Fact Sheet for Patients: BloggerCourse.com  Fact Sheet for Healthcare Providers: SeriousBroker.it  This test is not yet approved or cleared by the [place] FDA and has been authorized for detection and/or diagnosis of SARS-CoV-2 by FDA under an Emergency Use Authorization (EUA). This EUA will remain in effect (meaning this test can be used) for the duration of the COVID-19 declaration under Section 564(b)(1) of the Act, 21 U.S.C. section 360bbb-3(b)(1), unless the authorization is terminated or revoked.  Performed at [facility], [address] [place], [place] [address]   Blood culture (routine x 2)     Status: None (Preliminary result)   Collection Time: 11/15/20  4:10 PM   Specimen: BLOOD  Result Value Ref Range   Specimen Description      BLOOD LEFT ANTECUBITAL Performed at [facility], [address] [place], [place] [address]    Special Requests      BOTTLES DRAWN AEROBIC AND ANAEROBIC Blood Culture adequate volume Performed at [facility], [address] [place], [place] [address]    Culture  Setup Time      GRAM POSITIVE COCCI IN BOTH AEROBIC AND ANAEROBIC BOTTLES CRITICAL RESULT CALLED TO, READ BACK BY AND VERIFIED WITH: CINDY REED,RN @0956  11/16/20 EB Performed at [facility], [address],  [place], [place]    Culture GRAM POSITIVE COCCI    Report Status PENDING   Blood Culture ID Panel (Reflexed)     Status: Abnormal   Collection Time: 11/15/20  4:10 PM  Result Value Ref Range   Enterococcus faecalis NOT DETECTED NOT DETECTED   Enterococcus Faecium NOT DETECTED NOT DETECTED   Listeria monocytogenes NOT DETECTED NOT DETECTED   Staphylococcus species DETECTED (A) NOT DETECTED    Comment: CRITICAL RESULT CALLED TO, READ BACK BY AND VERIFIED WITH: CINDY REED,RN @0956  11/16/20 EB    Staphylococcus aureus (BCID) DETECTED (A) NOT DETECTED    Comment: Methicillin (oxacillin)-resistant Staphylococcus aureus (MRSA). MRSA is predictably resistant to beta-lactam antibiotics (except ceftaroline). Preferred therapy is vancomycin unless clinically contraindicated. Patient requires contact precautions if  hospitalized. CRITICAL RESULT CALLED TO, READ BACK BY AND VERIFIED WITH: CINDY REED,RN @0956  11/16/20 EB    Staphylococcus epidermidis NOT DETECTED NOT DETECTED   Staphylococcus lugdunensis NOT DETECTED NOT DETECTED   Streptococcus species NOT DETECTED NOT DETECTED   Streptococcus agalactiae NOT DETECTED NOT DETECTED   Streptococcus pneumoniae NOT DETECTED NOT DETECTED   Streptococcus pyogenes NOT DETECTED NOT DETECTED   A.calcoaceticus-baumannii NOT DETECTED NOT DETECTED   Bacteroides fragilis NOT DETECTED NOT DETECTED   Enterobacterales NOT DETECTED NOT DETECTED   Enterobacter cloacae complex NOT DETECTED NOT DETECTED   Escherichia coli NOT DETECTED NOT DETECTED   Klebsiella aerogenes NOT DETECTED NOT DETECTED   Klebsiella oxytoca NOT DETECTED NOT DETECTED   Klebsiella pneumoniae NOT DETECTED NOT DETECTED   Proteus species NOT DETECTED NOT DETECTED   Salmonella species NOT DETECTED NOT DETECTED   Serratia marcescens NOT DETECTED NOT DETECTED   Haemophilus influenzae NOT DETECTED NOT DETECTED   Neisseria meningitidis NOT DETECTED NOT DETECTED   Pseudomonas aeruginosa NOT  DETECTED NOT DETECTED   Stenotrophomonas maltophilia NOT DETECTED NOT DETECTED   Candida albicans NOT DETECTED NOT DETECTED   Candida auris NOT DETECTED NOT DETECTED   Candida glabrata NOT DETECTED NOT DETECTED   Candida krusei NOT DETECTED NOT DETECTED   Candida parapsilosis NOT DETECTED NOT DETECTED   Candida tropicalis NOT DETECTED NOT DETECTED   Cryptococcus neoformans/gattii NOT DETECTED NOT DETECTED   Meth resistant mecA/C and MREJ DETECTED (A) NOT DETECTED    Comment: CRITICAL RESULT CALLED TO, READ BACK BY AND VERIFIED WITH: CINDY REED,RN @0956  11/16/20 EB Performed at [facility], [address] [place], [address] [place]   Blood culture (routine x 2)     Status: None (Preliminary result)   Collection Time: 11/15/20  4:25 PM   Specimen: BLOOD  Result Value Ref Range   Specimen Description      BLOOD LEFT ANTECUBITAL Performed at [facility], [address] [place], [address] [place]    Special Requests      BOTTLES DRAWN AEROBIC ONLY Blood Culture results may not be optimal due to an inadequate volume of blood received in culture bottles Performed at [facility], [address] [place], [address] [place]    Culture  Setup Time      GRAM POSITIVE COCCI IN CLUSTERS AEROBIC BOTTLE ONLY Performed at [facility], [address], [place], [address] [place]    Culture GRAM POSITIVE COCCI    Report Status PENDING   Basic metabolic panel     Status: Abnormal   Collection Time: 11/15/20  5:35 PM  Result Value Ref Range   Sodium 124 (L) 135 - 145 mmol/L   Potassium 2.6 (LL) 3.5 - 5.1 mmol/L    Comment: CRITICAL RESULT CALLED TO, READ BACK BY AND VERIFIED WITH: REED.C,RN @ 1836 ON 11/15/2020, CABELLERO.P    Chloride 90 (L) 98 - 111 mmol/L   CO2 19 (L) 22 - 32 mmol/L   Glucose, Bld 128 (H) 70 - 99 mg/dL    Comment: Glucose reference range applies only to samples taken after fasting for at least 8 hours.   BUN 40 (H) 6 - 20 mg/dL    Creatinine, Ser 01/13/21 (H) 0.44 - 1.00 mg/dL   Calcium 7.7 (L) 8.9 - 10.3 mg/dL   GFR, Estimated 54 (L) >60 mL/min    Comment: (NOTE) Calculated using the CKD-EPI Creatinine Equation (2021)    Anion gap 15 5 -  15    Comment: Performed at [facility], [address] [place], [place] [address]  CK     Status: None   Collection Time: 11/15/20  5:35 PM  Result Value Ref Range   Total CK 43 38 - 234 U/L    Comment: Performed at [facility], [address] [place], [place] [address]  Hepatic function panel     Status: Abnormal   Collection Time: 11/15/20  5:35 PM  Result Value Ref Range   Total Protein 6.5 6.5 - 8.1 g/dL   Albumin 2.1 (L) 3.5 - 5.0 g/dL   AST 23 15 - 41 U/L   ALT 12 0 - 44 U/L   Alkaline Phosphatase 74 38 - 126 U/L   Total Bilirubin 0.9 0.3 - 1.2 mg/dL   Bilirubin, Direct 0.6 (H) 0.0 - 0.2 mg/dL   Indirect Bilirubin 0.3 0.3 - 0.9 mg/dL    Comment: Performed at [facility], [address] [place], [place] [address]  Lipase, blood     Status: None   Collection Time: 11/15/20  5:35 PM  Result Value Ref Range   Lipase 20 11 - 51 U/L    Comment: Performed at [facility], [address] [place], [place] [address]  Magnesium     Status: None   Collection Time: 11/15/20  5:35 PM  Result Value Ref Range   Magnesium 1.8 1.7 - 2.4 mg/dL    Comment: Performed at [facility], [address] [place], [place] [address]  Lactic acid, plasma     Status: Abnormal   Collection Time: 11/15/20  6:45 PM  Result Value Ref Range   Lactic Acid, Venous 3.6 (HH) 0.5 - 1.9 mmol/L    Comment: CRITICAL RESULT CALLED TO, READ BACK BY AND VERIFIED WITH: MAYNARD.S,RN @ 1922 ON 11/15/2020, CABELLERO.P Performed at [facility], [address] [place], [place] [address]   Pregnancy, urine     Status: None   Collection Time: 11/15/20  7:47 PM  Result Value Ref Range   Preg Test, Ur NEGATIVE NEGATIVE    Comment:         THE SENSITIVITY OF THIS METHODOLOGY IS >20 mIU/mL. Performed at [facility], [address] [place], [place] [address]   Urinalysis, Routine w reflex microscopic Urine, Clean Catch     Status: Abnormal   Collection Time: 11/15/20  7:47 PM  Result Value Ref Range   Color, Urine YELLOW YELLOW   APPearance HAZY (A) CLEAR   Specific Gravity, Urine 1.020 1.005 - 1.030   pH 6.0 5.0 - 8.0   Glucose, UA NEGATIVE NEGATIVE mg/dL   Hgb urine dipstick MODERATE (A) NEGATIVE   Bilirubin Urine NEGATIVE NEGATIVE   Ketones, ur NEGATIVE NEGATIVE mg/dL   Protein, ur 30 (A) NEGATIVE mg/dL   Nitrite POSITIVE (A) NEGATIVE   Leukocytes,Ua TRACE (A) NEGATIVE    Comment: Performed at [facility], [address] [place], [place] [address]  Rapid urine drug screen (hospital performed)     Status: Abnormal   Collection Time: 11/15/20  7:47 PM  Result Value Ref Range   Opiates NONE DETECTED NONE DETECTED   Cocaine POSITIVE (A) NONE DETECTED   Benzodiazepines NONE DETECTED NONE DETECTED   Amphetamines NONE DETECTED NONE DETECTED   Tetrahydrocannabinol NONE DETECTED NONE DETECTED   Barbiturates NONE DETECTED NONE DETECTED  Comment: (NOTE) DRUG SCREEN FOR MEDICAL PURPOSES ONLY.  IF CONFIRMATION IS NEEDED FOR ANY PURPOSE, NOTIFY LAB WITHIN 5 DAYS.  LOWEST DETECTABLE LIMITS FOR URINE DRUG SCREEN Drug Class                     Cutoff (ng/mL) Amphetamine and metabolites    1000 Barbiturate and metabolites    200 Benzodiazepine                 200 Tricyclics and metabolites     300 Opiates and metabolites        300 Cocaine and metabolites        300 THC                            50 Performed at [facility] [place], [address] [place], [place] [address]   Urinalysis, Microscopic (reflex)     Status: Abnormal   Collection Time: 11/15/20  7:47 PM  Result Value Ref Range   RBC / HPF 0-5 0 - 5 RBC/hpf   WBC, UA 6-10 0 - 5 WBC/hpf   Bacteria, UA MANY (A) NONE SEEN    Squamous Epithelial / LPF 0-5 0 - 5   Hyaline Casts, UA PRESENT    Amorphous Crystal PRESENT     Comment: Performed at [facility] [place], [address] [place], [place] [address]  Lactic acid, plasma     Status: Abnormal   Collection Time: 11/15/20  9:35 PM  Result Value Ref Range   Lactic Acid, Venous 2.7 (HH) 0.5 - 1.9 mmol/L    Comment: CRITICAL RESULT CALLED TO, READ BACK BY AND VERIFIED WITH: MAYNARD,C AT 2215 ON [id number] BY CHERESNOWSKY,T Performed at [facility] [place], [address] [place], [place] [address]   Comprehensive metabolic panel     Status: Abnormal   Collection Time: 11/16/20 10:37 AM  Result Value Ref Range   Sodium 130 (L) 135 - 145 mmol/L   Potassium 2.5 (LL) 3.5 - 5.1 mmol/L    Comment: CRITICAL RESULT CALLED TO, READ BACK BY AND VERIFIED WITH: ROUGHGARDEN, C, RN AT 1103 ON [id number] BY BOWLBY, J    Chloride 103 98 - 111 mmol/L   CO2 16 (L) 22 - 32 mmol/L   Glucose, Bld 119 (H) 70 - 99 mg/dL    Comment: Glucose reference range applies only to samples taken after fasting for at least 8 hours.   BUN 25 (H) 6 - 20 mg/dL   Creatinine, Ser 6.570.84 0.44 - 1.00 mg/dL   Calcium 7.0 (L) 8.9 - 10.3 mg/dL   Total Protein 5.7 (L) 6.5 - 8.1 g/dL   Albumin 1.7 (L) 3.5 - 5.0 g/dL   AST 27 15 - 41 U/L   ALT 11 0 - 44 U/L   Alkaline Phosphatase 71 38 - 126 U/L   Total Bilirubin 1.7 (H) 0.3 - 1.2 mg/dL   GFR, Estimated >84>60 >69>60 mL/min    Comment: (NOTE) Calculated using the CKD-EPI Creatinine Equation (2021)    Anion gap 11 5 - 15    Comment: Performed at [facility] [place], [address] [place], [place] [address]  Magnesium     Status: None   Collection Time: 11/16/20 10:37 AM  Result Value Ref Range   Magnesium 1.9 1.7 - 2.4 mg/dL    Comment: Performed at [facility] [place], [address] [place], [place] [address]    MICRO:  IMAGING: CT Head Wo Contrast  Result Date: 11/15/2020 CLINICAL DATA:  Classic migraine headache. EXAM: CT HEAD  WITHOUT CONTRAST TECHNIQUE: Contiguous axial images were obtained from the base of the skull through the vertex without intravenous contrast. COMPARISON:  07/08/2015 FINDINGS: Brain: The brain shows a normal appearance without evidence of malformation, atrophy, old or acute small or large vessel infarction, mass lesion, hemorrhage, hydrocephalus or extra-axial collection. Vascular: No hyperdense vessel. No evidence of atherosclerotic calcification. Skull: Normal.  No traumatic finding.  No focal bone lesion. Sinuses/Orbits: Air-fluid level in the right maxillary sinus suggesting rhinosinusitis. Other visualized sinuses are clear. Orbits negative. Other: None significant IMPRESSION: 1. Normal appearance of the brain itself. 2. Air-fluid level in the right maxillary sinus suggesting rhinosinusitis. Electronically Signed   By: Paulina Fusi M.D.   On: 11/15/2020 20:45   CT Angio Chest PE W and/or Wo Contrast  Result Date: 11/15/2020 CLINICAL DATA:  Body aches, weakness, cough for 1 week EXAM: CT ANGIOGRAPHY CHEST WITH CONTRAST TECHNIQUE: Multidetector CT imaging of the chest was performed using the standard protocol during bolus administration of intravenous contrast. Multiplanar CT image reconstructions and MIPs were obtained to evaluate the vascular anatomy. CONTRAST:  OMNIPAQUE IOHEXOL 350 MG/ML SOLN COMPARISON:  04/16/2016 FINDINGS: Cardiovascular: Satisfactory opacification of the pulmonary arteries to the segmental level. No evidence of pulmonary embolism. Normal heart size. No pericardial effusion. Mediastinum/Nodes: No enlarged mediastinal, hilar, or axillary lymph nodes. Thyroid gland, trachea, and esophagus demonstrate no significant findings. Lungs/Pleura: There are numerous bilateral cavitary nodules throughout the lungs. Interlobular septal thickening. Small left, trace right pleural effusions and associated atelectasis or consolidation. Upper Abdomen: Please see separately reported examination of  the abdomen. Musculoskeletal: No chest wall abnormality. No acute or significant osseous findings. Review of the MIP images confirms the above findings. IMPRESSION: 1. Negative examination for pulmonary embolism. 2. There are numerous bilateral cavitary nodules throughout the lungs. These are most consistent with septic emboli; vasculitis and metastatic disease general differential considerations. 3. Small left, trace right pleural effusions and associated atelectasis or consolidation. Electronically Signed   By: Lauralyn Primes M.D.   On: 11/15/2020 20:42   CT ABDOMEN PELVIS W CONTRAST  Result Date: 11/15/2020 CLINICAL DATA:  Abdominal distension, left flank pain EXAM: CT ABDOMEN AND PELVIS WITH CONTRAST TECHNIQUE: Multidetector CT imaging of the abdomen and pelvis was performed using the standard protocol following bolus administration of intravenous contrast. CONTRAST:  OMNIPAQUE IOHEXOL 350 MG/ML SOLN COMPARISON:  03/31/2016 FINDINGS: Lower chest: Please see separately reported examination of the chest. Hepatobiliary: No solid liver abnormality is seen. No gallstones, gallbladder wall thickening, or biliary dilatation. Pancreas: Unremarkable. No pancreatic ductal dilatation or surrounding inflammatory changes. Spleen: Splenomegaly, maximum coronal span 17.4 cm. Adrenals/Urinary Tract: Adrenal glands are unremarkable. There are multiple subtly hypoenhancing lesions of the renal cortices. No hydronephrosis. Bladder is unremarkable. Stomach/Bowel: Stomach is within normal limits. Appendix appears normal. No evidence of bowel wall thickening, distention, or inflammatory changes. Vascular/Lymphatic: No significant vascular findings are present. No enlarged abdominal or pelvic lymph nodes. Reproductive: No mass or other significant abnormality. Other: No abdominal wall hernia or abnormality. No abdominopelvic ascites. Musculoskeletal: No acute or significant osseous findings. IMPRESSION: 1. There are multiple  subtly hypoenhancing lesions of the renal cortices. Findings are most in keeping with septic emboli. 2. Splenomegaly, maximum coronal span 17.4 cm. Electronically Signed   By: Lauralyn Primes M.D.   On: 11/15/2020 20:45  DG Chest Portable 1 View  Result Date: 11/15/2020 CLINICAL DATA:  Weakness. Generalized body aches and cough for 1 week. Nausea. EXAM: PORTABLE CHEST 1 VIEW COMPARISON:  04/09/2016 FINDINGS: Cardiac silhouette is normal in size. Normal mediastinal and hilar contours. There are focal opacities in both lungs that are similar to the prior chest radiograph. Current opacities are likely due to residual scarring from the previous septic emboli. Areas of acute infection, however, are not excluded. Mild reticular opacity at the left lung base is noted consist subsegmental atelectasis or scarring. There are prominent bronchovascular markings, stable. No evidence of pulmonary edema. No convincing pleural effusion or pneumothorax. Skeletal structures are grossly intact. IMPRESSION: 1. Bilateral lung opacities that likely residual scarring from previous septic emboli as noted on the exams from 04/03/2016. Acute areas infection/inflammation excluded, but felt less likely. No evidence of pulmonary edema. Electronically Signed   By: Amie Portland M.D.   On: 11/15/2020 15:53    Assessment/Plan: 35yo F with MRSA bacteremia, presumed septic pulmonary emboli concerning for right sided endocarditis  Recommend TTE, then TEE Continue on vancomycin  repeat blood cx tomorrow May need imaging of right shoulder once better pain control to see whether any signs of septic arthritis  Iv use = dr Ophelia Charter doing a trial of suboxone  Health maintenance = recommend HIV and HCV ab testing

## 2020-11-16 NOTE — ED Notes (Signed)
I have just given report to [place], RN @ [facility]; and I gave report to Revonda Standard, Charity fundraiser at [facility]. Pt. Is unabl to sign transfer form, however, she does give verbal consent for transfer.

## 2020-11-16 NOTE — H&P (Addendum)
History and Physical    Anne Bautista OHY:[id number] DOB: 1986-07-18 DOA: 11/15/2020  PCP: Patient, No Pcp Per Consultants:  None Patient coming from:  Home - lives with boyfriend, [name]; [place]: Joanna Hews, [phone]   Chief Complaint:  Cough, myalgias  HPI: Anne Bautista is a 35 y.o. female with medical history significant of IVDA presenting with cough and myalgias.  Everything hurts so bad.  She started with a cyst in her armpit that went away and then she had pain in her shoulder and it went all over.  Her R leg and L foot, ribs, stomach all hurting.  +SOB.  This has been going on for about a week and a half.  +fever up to 105.5.  +cough, nonproductive. She is unable to stand or walk currently.  She reports that she was in recovery from IVDA and her last IV drug use had been 2 years until she used about 3 days ago.  She denies use from 2ya until 3 days ago - but then is vague in her answers to further questions and aknowledges a multitude of track marks.    She also says she did not previously use MAT.  However, upon further discussion she asked if those were "those little strips under the tongue" and reports some prior inconsistent use.  She is willing to use suboxone and recognizes the need to quit because she doesn't "want to die and I know I will."    She also acknowledges prior hospitalization - possibly at [facility] - about 4 years ago with ?endocarditis.  She was supposed to be on antibiotics for 6 weeks but she lasted 21-28 days before she left AMA.   ED Course:  [facility] to [facility] transfer, per Dr. Antionette Char:  Accepted from [facility] for sepsis, likely endocarditis. 34 yof with hx of IVDU presenting with 1 wk of aches, malaise, and cough. Afeb with HR 110-140, RR 30s, sat mid-upper 90s on rm air, SBP 100-130. Labs with WBC 15.3, platelets 51, lactate 3.6, Na 124, K 2.6, and SCr 1.33. CTs neg for PE but notable for likely septic emboli involving lungs and kidneys. She is being cultured, fluid  resuscitated, and started on vancomycin and cefepime. COVID negative.    Review of Systems: As per HPI; otherwise review of systems reviewed and negative.   Ambulatory Status:  Ambulates without assistance  COVID Vaccine Status:  None  Past Medical History:  Diagnosis Date  . IV drug user   . Malnutrition (HCC) 11/16/2020  . Poor dentition 11/16/2020  . Tobacco dependence 11/16/2020    History reviewed. No pertinent surgical history.  Social History   Socioeconomic History  . Marital status: Single    Spouse name: Not on file  . Number of children: Not on file  . Years of education: Not on file  . Highest education level: Not on file  Occupational History  . Occupation: unemployed  Tobacco Use  . Smoking status: Current Every Day Smoker    Packs/day: 1.00    Years: 18.00    Pack years: 18.00    Types: Cigarettes  . Smokeless tobacco: Never Used  . Tobacco comment: last used 2 weeks ago  Substance and Sexual Activity  . Alcohol use: Never  . Drug use: Yes    Types: Cocaine, Marijuana    Comment: daily use; heroin previously and 3 days ago; occasional marijuana  . Sexual activity: Not on file  Other Topics Concern  . Not on file  Social History  Narrative  . Not on file   Social Determinants of Health   Financial Resource Strain: Not on file  Food Insecurity: Not on file  Transportation Needs: Not on file  Physical Activity: Not on file  Stress: Not on file  Social Connections: Not on file  Intimate Partner Violence: Not on file    No Known Allergies  History reviewed. No pertinent family history.  Prior to Admission medications   Not on File    Physical Exam: Vitals:   11/16/20 1030 11/16/20 1100 11/16/20 1147 11/16/20 1150  BP: 131/79   124/69  Pulse: (!) 124 (!) 125  (!) 132  Resp: (!) 46 (!) 46  18  Temp:    98.8 F (37.1 C)  TempSrc:    Oral  SpO2: 96% 100%  98%  Weight:   71.5 kg   Height:    (1.702 m)      . General:  Appears  older than stated age, disheveled, anxious . Eyes:   EOMI, normal lids, iris . ENT:  grossly normal hearing, lips & tongue, mildly to moderately dry mm; poor dentition . Neck:  no LAD, masses or thyromegaly . Cardiovascular:  RR with persistent tachycardia to 130s, no m/r/g appreciated but this was hard to discern due to her tachycardia. No LE edema.  Marland Kitchen Respiratory:   Scattered rhonchi.  Increased respiratory effort. . Abdomen:  Soft, mildly diffusely tender, ND . Skin:  no rash or induration seen on limited exam; scattered track marks on B forearms and also L lower leg (bandaid in place there) . Musculoskeletal:  grossly normal tone BUE/BLE, good ROM, no bony abnormality.  RUE with significant pain with movement but axillary region was examined superficially and appears to be normal and without apparent current nidus of infection . Psychiatric:  Anxious/labile mood and affect, speech fluent and appropriate, AOx3, periodically crying out in pain . Neurologic:  CN 2-12 grossly intact, moves all extremities in coordinated fashion    Radiological Exams on Admission: Independently reviewed - see discussion in A/P where applicable  CT Head Wo Contrast  Result Date: 11/15/2020 CLINICAL DATA:  Classic migraine headache. EXAM: CT HEAD WITHOUT CONTRAST TECHNIQUE: Contiguous axial images were obtained from the base of the skull through the vertex without intravenous contrast. COMPARISON:  07/08/2015 FINDINGS: Brain: The brain shows a normal appearance without evidence of malformation, atrophy, old or acute small or large vessel infarction, mass lesion, hemorrhage, hydrocephalus or extra-axial collection. Vascular: No hyperdense vessel. No evidence of atherosclerotic calcification. Skull: Normal.  No traumatic finding.  No focal bone lesion. Sinuses/Orbits: Air-fluid level in the right maxillary sinus suggesting rhinosinusitis. Other visualized sinuses are clear. Orbits negative. Other: None significant  IMPRESSION: 1. Normal appearance of the brain itself. 2. Air-fluid level in the right maxillary sinus suggesting rhinosinusitis. Electronically Signed   By: Paulina Fusi M.D.   On: 11/15/2020 20:45   CT Angio Chest PE W and/or Wo Contrast  Result Date: 11/15/2020 CLINICAL DATA:  Body aches, weakness, cough for 1 week EXAM: CT ANGIOGRAPHY CHEST WITH CONTRAST TECHNIQUE: Multidetector CT imaging of the chest was performed using the standard protocol during bolus administration of intravenous contrast. Multiplanar CT image reconstructions and MIPs were obtained to evaluate the vascular anatomy. CONTRAST:  OMNIPAQUE IOHEXOL 350 MG/ML SOLN COMPARISON:  04/16/2016 FINDINGS: Cardiovascular: Satisfactory opacification of the pulmonary arteries to the segmental level. No evidence of pulmonary embolism. Normal heart size. No pericardial effusion. Mediastinum/Nodes: No enlarged mediastinal, hilar, or axillary  lymph nodes. Thyroid gland, trachea, and esophagus demonstrate no significant findings. Lungs/Pleura: There are numerous bilateral cavitary nodules throughout the lungs. Interlobular septal thickening. Small left, trace right pleural effusions and associated atelectasis or consolidation. Upper Abdomen: Please see separately reported examination of the abdomen. Musculoskeletal: No chest wall abnormality. No acute or significant osseous findings. Review of the MIP images confirms the above findings. IMPRESSION: 1. Negative examination for pulmonary embolism. 2. There are numerous bilateral cavitary nodules throughout the lungs. These are most consistent with septic emboli; vasculitis and metastatic disease general differential considerations. 3. Small left, trace right pleural effusions and associated atelectasis or consolidation. Electronically Signed   By: Lauralyn PrimesAlex  Bibbey M.D.   On: 11/15/2020 20:42   CT ABDOMEN PELVIS W CONTRAST  Result Date: 11/15/2020 CLINICAL DATA:  Abdominal distension, left flank pain  EXAM: CT ABDOMEN AND PELVIS WITH CONTRAST TECHNIQUE: Multidetector CT imaging of the abdomen and pelvis was performed using the standard protocol following bolus administration of intravenous contrast. CONTRAST:  100mL OMNIPAQUE IOHEXOL 350 MG/ML SOLN COMPARISON:  03/31/2016 FINDINGS: Lower chest: Please see separately reported examination of the chest. Hepatobiliary: No solid liver abnormality is seen. No gallstones, gallbladder wall thickening, or biliary dilatation. Pancreas: Unremarkable. No pancreatic ductal dilatation or surrounding inflammatory changes. Spleen: Splenomegaly, maximum coronal span 17.4 cm. Adrenals/Urinary Tract: Adrenal glands are unremarkable. There are multiple subtly hypoenhancing lesions of the renal cortices. No hydronephrosis. Bladder is unremarkable. Stomach/Bowel: Stomach is within normal limits. Appendix appears normal. No evidence of bowel wall thickening, distention, or inflammatory changes. Vascular/Lymphatic: No significant vascular findings are present. No enlarged abdominal or pelvic lymph nodes. Reproductive: No mass or other significant abnormality. Other: No abdominal wall hernia or abnormality. No abdominopelvic ascites. Musculoskeletal: No acute or significant osseous findings. IMPRESSION: 1. There are multiple subtly hypoenhancing lesions of the renal cortices. Findings are most in keeping with septic emboli. 2. Splenomegaly, maximum coronal span 17.4 cm. Electronically Signed   By: Lauralyn PrimesAlex  Bibbey M.D.   On: 11/15/2020 20:45   DG Chest Portable 1 View  Result Date: 11/15/2020 CLINICAL DATA:  Weakness. Generalized body aches and cough for 1 week. Nausea. EXAM: PORTABLE CHEST 1 VIEW COMPARISON:  04/09/2016 FINDINGS: Cardiac silhouette is normal in size. Normal mediastinal and hilar contours. There are focal opacities in both lungs that are similar to the prior chest radiograph. Current opacities are likely due to residual scarring from the previous septic emboli. Areas of  acute infection, however, are not excluded. Mild reticular opacity at the left lung base is noted consist subsegmental atelectasis or scarring. There are prominent bronchovascular markings, stable. No evidence of pulmonary edema. No convincing pleural effusion or pneumothorax. Skeletal structures are grossly intact. IMPRESSION: 1. Bilateral lung opacities that likely residual scarring from previous septic emboli as noted on the exams from 04/03/2016. Acute areas infection/inflammation excluded, but felt less likely. No evidence of pulmonary edema. Electronically Signed   By: Amie Portlandavid  Ormond M.D.   On: 11/15/2020 15:53    EKG: Independently reviewed.  Sinus tachycardia with rate 137; nonspecific ST changes with no evidence of acute ischemia   Labs on Admission: I have personally reviewed the available labs and imaging studies at the time of the admission.  Pertinent labs:   Na++ 130 K+ 2.5 CO2 16 Glucose 119 Calcium 7.0 Albumin 1.7 Bili 1.7 WBC 15.3 Platelets 51 Lactate 3.6, 2.7 HS troponin 13, 12 CK 43 Upreg negative UA: moderate Hgb, trace LE, nitrite +, 30 protein, many bacteria UDS + cocaine Urine culture  pending BCID + for MRSA COVID negaitve   Assessment/Plan Principal Problem:   Severe sepsis due to methicillin resistant Staphylococcus aureus (MRSA) with acute organ dysfunction (HCC) Active Problems:   IV drug abuse (HCC)   Poor dentition   Malnutrition (HCC)   Thrombocytopenia (HCC)   Tobacco dependence   Severe sepsis with MRSA bacteremia, concern for endocarditis -Sepsis indicates life-threatening organ dysfunction with mortality >10%, caused by dysregulation to host response.   -SIRS criteria in this patient includes: Leukocytosis, tachycardia, tachypnea  -Patient has evidence of acute organ failure with elevated lactate >2; platelets <100 that is not easily explained by another condition. -While awaiting blood cultures, this appears to be a preseptic  condition. -Sepsis protocol initiated -Worse outcomes are predicted from sepsis with 2 of the following: RR > 22; AMS , GCS < 13; or SBP <100.  This patient meets 1 of these criteria. -Suspected source is IVDA-associated bacteremia with possible endocarditis. -CT showed probable septic emboli in the lungs and kidneys -Urine culture pending -Blood culture has grown MRSA -Will admit due to: bacteremia; she is likely to have a LLOS (unless she leaves AMA) since she will need prolonged antibiotic therapy -Treat with IV Vanc monotherapy based on BCID -Will add HIV, Hepatitis testing, RPR -Will trend lactate to ensure improvement -Will order procalcitonin level.   -ID consult appreciated. -Echo requested; if negative will need TEE for further evaluation of heart valves. -Prior records from [facility] are not currently available but it may be helpful to obtain those (possibly link chart via CareEverywhere)  IVDA -Patient acknowledges routine cocaine use and intermittent marijuana use but denies chronic use of IV drugs (reported one-time recent use about 3 days ago for pain control) -UDS positive only for cocaine -However, she has visible track marks on at least 3 extremities and this is suspect -Regardless, she reports a desire to quit and recognizes that ongoing use is life-threatening -Will initiate suboxone therapy now; while this may precipitate early withdrawal, hopefully he can reach a level of withdrawal symptom relief that will enable him to remain hospitalized and complete treatment -Will use the Suboxone therapy order set -If patient experiences precipitated withdrawal, will give small, frequent doses of buprenorphine (2 mg q 1-2h with adjunctive Clonidine 0.1 mg q8h, Zofran, and NSAIDs) until the precipitated withdrawal is overcome -If patient requires pain management in addition to home Suboxone therapy, use high-affinity full agonist opioids such as hydromorphone IV - not ordered at this  time -Will monitor on COWS protocol (5-12 mild symptoms; 13-24 moderate symptoms; 25-36 moderately severe symptoms; >36 severe symptoms) -TOC team consult for substance abuse education/support -prn orders from the Clonidine withdrawal order set were also ordered.  Poor dentition -This is also a potential source of infection -She does have multiple cavitary lung lesions which could result from dentition -However, MRSA is a less likely source of dental infections and she has acknowledged IVDA with track marks -Consider outpatient dental referral  Electrolyte abnormalities -K+ is quite low at 2.5; repleting with IV and PO -Calcium also low; will check ionized calcium  Malnutrition -Body mass index is 24.69 kg/m..  -The patient has at least 2 indicators for malnutrition (insufficient energy intake, loss of muscle mass, loss of subcutaneous fat.  -This is likely due to starvation related   -Will obtain a nutrition consult for further recommendations.  Thrombocytopenia -Possibly related to sepsis -Will trend  Tobacco dependence -Encourage cessation.   -This was discussed with the patient and should be reviewed on  an ongoing basis.   -Patch ordered     Note: This patient has been tested and is negative for the novel coronavirus COVID-19. She has NOT been vaccinated against COVID-19.   Level of care: Progressive DVT prophylaxis:  SCDs Code Status:  Full  Family Communication: None present Disposition Plan:  The patient is from: home  Anticipated d/c is to: be determined  Anticipated d/c date will depend on clinical response to treatment, but she is likely to require a prolonged LOS  Patient is currently: acutely ill Consults called: ID; nutrition; TOC team; spiritual care; PT/OT Admission status: Admit - It is my clinical opinion that admission to INPATIENT is reasonable and necessary because of the expectation that this patient will require hospital care that crosses at least 2  midnights to treat this condition based on the medical complexity of the problems presented.  Given the aforementioned information, the predictability of an adverse outcome is felt to be significant.   Jonah Blue MD [facility]   How to contact the [facility] Attending or Consulting provider 7A - 7P or covering provider during after hours 7P -7A, for this patient?  1. Check the care team in [facility] and look for a) attending/consulting TRH provider listed and b) the [facility] team listed 2. Log into [email or web address] and use [place]'s universal password to access. If you do not have the password, please contact the hospital operator. 3. Locate the [facility] provider you are looking for under [facility] and page to a number that you can be directly reached. 4. If you still have difficulty reaching the provider, please page the [facility] (Director on Call) for the Hospitalists listed on amion for assistance.   11/16/2020, 1:31 PM

## 2020-11-16 NOTE — Progress Notes (Signed)
Pharmacy Antibiotic Note  Anne Bautista is a 36 y.o. female admitted on 11/15/2020 with sepsis.  Pharmacy has been consulted for vancomycin/cefepime dosing.  Presenting with generalized body aches, cough, and nausea. WBC 15.2, LA 3.6>2.7, afebrile. BCx growing GPC with BCID showing MRSA - ID following. Scr improved today from 1.33 to 0.84 (CrCl 90 mL/min) - will adjust vancomycin dosing.  Plan: Adjust to vancomycin 1000 mg IV every 12 hours (est AUC 478, using Scr 0.8) Plan to discontinue cefepime given BCx results  Monitor renal fx, cx results, clinical pic, vanc levels as indicated   Height: 5\' 7"  (170.2 cm) Weight: 68 kg (150 lb) IBW/kg (Calculated) : 61.6  Temp (24hrs), Avg:98.5 F (36.9 C), Min:98 F (36.7 C), Max:99.2 F (37.3 C)  Recent Labs  Lab 11/15/20 1337 11/15/20 1735 11/15/20 1845 11/15/20 2135 11/16/20 1037  WBC 15.3*  --   --   --   --   CREATININE  --  1.33*  --   --  0.84  LATICACIDVEN  --   --  3.6* 2.7*  --     Estimated Creatinine Clearance: 90.9 mL/min (by C-G formula based on SCr of 0.84 mg/dL).    No Known Allergies   Antimicrobials this admission: Cefepime 2/12 >>  Vancomycin 2/12 >>   Dose adjustments this admission: 2/13: Scr improved, regimen changed to 1g IV q12 hr (estAUC 478)  Microbiology results: 2/12 BCx (3/3): GPC - BCID MRSA 2/12 COVID PCR: neg  Thank you for allowing pharmacy to be a part of this patient's care.  4/12, PharmD, BCCCP Clinical Pharmacist  Phone: [phone] 11/16/2020 11:09 AM  Please check AMION for all [facility] Pharmacy phone numbers After 10:00 PM, call Main Pharmacy [phone]

## 2020-11-16 NOTE — ED Notes (Signed)
Pt boyfriend called for update. Informed him that pt was still here and being treated.

## 2020-11-16 NOTE — Progress Notes (Signed)
Cross-coverage note:   Patient seen for red MEWS (due to temp 38.8 C, HR 134, and RR 21).   She is admitted with sepsis, suspected endocarditis, found to have MRSA bacteremia.   She is restless, anxious, complaining of pain "everywhere."   Appears restless/anxious, no pallor or diaphoresis. Tachypneic with scattered rhonchi, on rm air. HR ~120 and regular. Abd soft, tender without peritoneal signs.   Plan to continue treating sepsis with IVF and abx while trending lactate. Continue to treat pain and withdrawal.   MEWS now down from 6 to 2 with prn medications.

## 2020-11-16 NOTE — Procedures (Signed)
Attempted echo, but patient is yelling due to pain, heart rate is high, and blood work is being drawn at this time.

## 2020-11-16 NOTE — ED Notes (Signed)
Date and time results received: 11/16/20 1105 (use smartphrase ".now" to insert current time)  Test: potassium Critical Value: 2.5  Name of Provider Notified: Wilson Singer RN Orders Received? Or Actions Taken?: no orders given

## 2020-11-17 ENCOUNTER — Inpatient Hospital Stay (HOSPITAL_COMMUNITY): Payer: Self-pay

## 2020-11-17 DIAGNOSIS — I76 Septic arterial embolism: Secondary | ICD-10-CM

## 2020-11-17 DIAGNOSIS — M7989 Other specified soft tissue disorders: Secondary | ICD-10-CM

## 2020-11-17 DIAGNOSIS — R600 Localized edema: Secondary | ICD-10-CM

## 2020-11-17 DIAGNOSIS — E871 Hypo-osmolality and hyponatremia: Secondary | ICD-10-CM

## 2020-11-17 DIAGNOSIS — R000 Tachycardia, unspecified: Secondary | ICD-10-CM

## 2020-11-17 DIAGNOSIS — B9562 Methicillin resistant Staphylococcus aureus infection as the cause of diseases classified elsewhere: Secondary | ICD-10-CM

## 2020-11-17 DIAGNOSIS — I339 Acute and subacute endocarditis, unspecified: Secondary | ICD-10-CM

## 2020-11-17 DIAGNOSIS — I2690 Septic pulmonary embolism without acute cor pulmonale: Secondary | ICD-10-CM

## 2020-11-17 DIAGNOSIS — M25511 Pain in right shoulder: Secondary | ICD-10-CM

## 2020-11-17 DIAGNOSIS — F1910 Other psychoactive substance abuse, uncomplicated: Secondary | ICD-10-CM

## 2020-11-17 DIAGNOSIS — F1123 Opioid dependence with withdrawal: Secondary | ICD-10-CM

## 2020-11-17 LAB — ECHOCARDIOGRAM COMPLETE
Area-P 1/2: 4.31 cm2
Calc EF: 58.9 %
Height: 67 in
S' Lateral: 3.2 cm
Single Plane A2C EF: 55.2 %
Single Plane A4C EF: 62.8 %
Weight: 2751.34 oz

## 2020-11-17 LAB — MRSA PCR SCREENING: MRSA by PCR: POSITIVE — AB

## 2020-11-17 LAB — CBC
HCT: 27.9 % — ABNORMAL LOW (ref 36.0–46.0)
Hemoglobin: 10.1 g/dL — ABNORMAL LOW (ref 12.0–15.0)
MCH: 28.1 pg (ref 26.0–34.0)
MCHC: 36.2 g/dL — ABNORMAL HIGH (ref 30.0–36.0)
MCV: 77.5 fL — ABNORMAL LOW (ref 80.0–100.0)
Platelets: 40 10*3/uL — ABNORMAL LOW (ref 150–400)
RBC: 3.6 MIL/uL — ABNORMAL LOW (ref 3.87–5.11)
RDW: 15.2 % (ref 11.5–15.5)
WBC: 8.2 10*3/uL (ref 4.0–10.5)
nRBC: 0 % (ref 0.0–0.2)

## 2020-11-17 LAB — BASIC METABOLIC PANEL
Anion gap: 8 (ref 5–15)
BUN: 22 mg/dL — ABNORMAL HIGH (ref 6–20)
CO2: 19 mmol/L — ABNORMAL LOW (ref 22–32)
Calcium: 7.4 mg/dL — ABNORMAL LOW (ref 8.9–10.3)
Chloride: 102 mmol/L (ref 98–111)
Creatinine, Ser: 0.84 mg/dL (ref 0.44–1.00)
GFR, Estimated: >60 mL/min (ref >60)
Glucose, Bld: 104 mg/dL — ABNORMAL HIGH (ref 70–99)
Potassium: 3.9 mmol/L (ref 3.5–5.1)
Sodium: 129 mmol/L — ABNORMAL LOW (ref 135–145)

## 2020-11-17 LAB — HEPATIC FUNCTION PANEL
ALT: 11 U/L (ref 0–44)
AST: 19 U/L (ref 15–41)
Albumin: 1.3 g/dL — ABNORMAL LOW (ref 3.5–5.0)
Alkaline Phosphatase: 73 U/L (ref 38–126)
Bilirubin, Direct: 0.8 mg/dL — ABNORMAL HIGH (ref 0.0–0.2)
Indirect Bilirubin: 0.7 mg/dL (ref 0.3–0.9)
Total Bilirubin: 1.5 mg/dL — ABNORMAL HIGH (ref 0.3–1.2)
Total Protein: 5.5 g/dL — ABNORMAL LOW (ref 6.5–8.1)

## 2020-11-17 LAB — SYNOVIAL CELL COUNT + DIFF, W/ CRYSTALS
Crystals, Fluid: NONE SEEN
Eosinophils-Synovial: 0 % (ref 0–1)
Lymphocytes-Synovial Fld: 1 % (ref 0–20)
Monocyte-Macrophage-Synovial Fluid: 11 % — ABNORMAL LOW (ref 50–90)
Neutrophil, Synovial: 88 % — ABNORMAL HIGH (ref 0–25)
WBC, Synovial: 8250 /mm3 — ABNORMAL HIGH (ref 0–200)

## 2020-11-17 LAB — DIC (DISSEMINATED INTRAVASCULAR COAGULATION)PANEL
D-Dimer, Quant: 4.17 ug/mL-FEU — ABNORMAL HIGH (ref 0.00–0.50)
Fibrinogen: 452 mg/dL (ref 210–475)
INR: 1.4 — ABNORMAL HIGH (ref 0.8–1.2)
Platelets: 53 10*3/uL — ABNORMAL LOW (ref 150–400)
Prothrombin Time: 16.6 seconds — ABNORMAL HIGH (ref 11.4–15.2)
Smear Review: NONE SEEN
aPTT: 31 seconds (ref 24–36)

## 2020-11-17 LAB — PROTIME-INR
INR: 1.4 — ABNORMAL HIGH (ref 0.8–1.2)
Prothrombin Time: 16.7 seconds — ABNORMAL HIGH (ref 11.4–15.2)

## 2020-11-17 LAB — LACTIC ACID, PLASMA
Lactic Acid, Venous: 2.7 mmol/L (ref 0.5–1.9)
Lactic Acid, Venous: 2.1 mmol/L (ref 0.5–1.9)

## 2020-11-17 LAB — RPR: RPR Ser Ql: NONREACTIVE

## 2020-11-17 MED ORDER — LORAZEPAM 2 MG/ML IJ SOLN
0.5000 mg | Freq: Four times a day (QID) | INTRAMUSCULAR | Status: DC | PRN
  Administered 2020-11-17 – 2020-11-18 (×2): 0.5 mg via INTRAVENOUS
  Filled 2020-11-17 (×2): qty 1

## 2020-11-17 MED ORDER — SODIUM CHLORIDE 0.9 % IV SOLN
INTRAVENOUS | Status: DC | PRN
  Administered 2020-11-17: 1000 mL via INTRAVENOUS
  Administered 2020-12-03 – 2020-12-17 (×3): 250 mL via INTRAVENOUS
  Administered 2020-12-20 – 2020-12-21 (×2): 1000 mL via INTRAVENOUS
  Administered 2020-12-23 – 2020-12-26 (×4): 250 mL via INTRAVENOUS

## 2020-11-17 MED ORDER — HYDROMORPHONE HCL 1 MG/ML IJ SOLN
1.0000 mg | Freq: Once | INTRAMUSCULAR | Status: AC
  Administered 2020-11-17: 1 mg via INTRAVENOUS
  Filled 2020-11-17: qty 1

## 2020-11-17 MED ORDER — POLYETHYLENE GLYCOL 3350 17 G PO PACK
17.0000 g | PACK | Freq: Two times a day (BID) | ORAL | Status: DC | PRN
  Filled 2020-11-17: qty 1

## 2020-11-17 MED ORDER — BUPRENORPHINE HCL-NALOXONE HCL 2-0.5 MG SL SUBL: 1.0000 | SUBLINGUAL_TABLET | SUBLINGUAL | Status: DC | PRN

## 2020-11-17 MED ORDER — LACTATED RINGERS IV BOLUS
1000.0000 mL | Freq: Once | INTRAVENOUS | Status: AC
  Administered 2020-11-17: 1000 mL via INTRAVENOUS

## 2020-11-17 MED ORDER — ORAL CARE MOUTH RINSE
15.0000 mL | Freq: Two times a day (BID) | OROMUCOSAL | Status: DC
  Administered 2020-11-17 – 2020-12-26 (×48): 15 mL via OROMUCOSAL

## 2020-11-17 MED ORDER — CHLORHEXIDINE GLUCONATE 0.12 % MT SOLN
15.0000 mL | Freq: Two times a day (BID) | OROMUCOSAL | Status: DC
  Administered 2020-11-17 – 2020-12-27 (×63): 15 mL via OROMUCOSAL
  Filled 2020-11-17 (×74): qty 15

## 2020-11-17 MED ORDER — BUPIVACAINE HCL (PF) 0.5 % IJ SOLN
5.0000 mL | Freq: Once | INTRAMUSCULAR | Status: AC
  Administered 2020-11-17: 5 mL
  Filled 2020-11-17 (×2): qty 10

## 2020-11-17 MED ORDER — METOPROLOL TARTRATE 5 MG/5ML IV SOLN
2.5000 mg | Freq: Four times a day (QID) | INTRAVENOUS | Status: DC | PRN
  Administered 2020-11-20 – 2020-11-26 (×4): 2.5 mg via INTRAVENOUS
  Filled 2020-11-17 (×4): qty 5

## 2020-11-17 MED ORDER — MUPIROCIN 2 % EX OINT
1.0000 "application " | TOPICAL_OINTMENT | Freq: Two times a day (BID) | CUTANEOUS | Status: AC
  Administered 2020-11-17 – 2020-11-22 (×10): 1 via NASAL
  Filled 2020-11-17 (×3): qty 22

## 2020-11-17 MED ORDER — ACETAMINOPHEN 500 MG PO TABS
1000.0000 mg | ORAL_TABLET | Freq: Once | ORAL | Status: AC
  Administered 2020-11-17: 1000 mg via ORAL

## 2020-11-17 MED ORDER — ENSURE ENLIVE PO LIQD
237.0000 mL | Freq: Three times a day (TID) | ORAL | Status: DC
  Administered 2020-11-17 – 2020-12-05 (×30): 237 mL via ORAL

## 2020-11-17 MED ORDER — MAGNESIUM SULFATE 2 GM/50ML IV SOLN
2.0000 g | Freq: Once | INTRAVENOUS | Status: AC
  Administered 2020-11-17: 2 g via INTRAVENOUS
  Filled 2020-11-17: qty 50

## 2020-11-17 MED ORDER — HYDROMORPHONE HCL 1 MG/ML IJ SOLN
1.0000 mg | Freq: Once | INTRAMUSCULAR | Status: AC
  Administered 2020-11-17: 1 mg via INTRAVENOUS

## 2020-11-17 MED ORDER — HYDROMORPHONE HCL 1 MG/ML IJ SOLN
INTRAMUSCULAR | Status: AC
  Filled 2020-11-17: qty 1

## 2020-11-17 MED ORDER — METOPROLOL TARTRATE 5 MG/5ML IV SOLN
INTRAVENOUS | Status: AC
  Administered 2020-11-17: 2.5 mg via INTRAVENOUS
  Filled 2020-11-17: qty 5

## 2020-11-17 MED ORDER — CHLORHEXIDINE GLUCONATE CLOTH 2 % EX PADS
6.0000 | MEDICATED_PAD | Freq: Every day | CUTANEOUS | Status: AC
  Administered 2020-11-19 – 2020-11-22 (×4): 6 via TOPICAL

## 2020-11-17 MED ORDER — METOPROLOL TARTRATE 5 MG/5ML IV SOLN
2.5000 mg | Freq: Once | INTRAVENOUS | Status: AC
  Administered 2020-11-17: 2.5 mg via INTRAVENOUS

## 2020-11-17 MED ORDER — CHLORHEXIDINE GLUCONATE CLOTH 2 % EX PADS
6.0000 | MEDICATED_PAD | Freq: Every day | CUTANEOUS | Status: DC
  Administered 2020-11-17 – 2020-11-18 (×2): 6 via TOPICAL

## 2020-11-17 MED ORDER — HYDROMORPHONE HCL 1 MG/ML IJ SOLN
1.0000 mg | INTRAMUSCULAR | Status: DC | PRN
  Administered 2020-11-18 (×2): 1 mg via INTRAVENOUS
  Filled 2020-11-17 (×2): qty 1

## 2020-11-17 MED ORDER — OXYCODONE HCL 5 MG PO TABS
10.0000 mg | ORAL_TABLET | Freq: Four times a day (QID) | ORAL | Status: DC | PRN
  Administered 2020-11-17 – 2020-11-18 (×3): 10 mg via ORAL
  Filled 2020-11-17 (×3): qty 2

## 2020-11-17 NOTE — Progress Notes (Signed)
Initial Nutrition Assessment  DOCUMENTATION CODES:   Not applicable  INTERVENTION:   -Ensure Enlive po TID, each supplement provides 350 kcal and 20 grams of protein -MVI with minerals daily  NUTRITION DIAGNOSIS:   Increased nutrient needs related to acute illness (MRSA bactermia) as evidenced by estimated needs.  GOAL:   Patient will meet greater than or equal to 90% of their needs  MONITOR:   PO intake,Supplement acceptance,Labs,Weight trends,Skin,I & O's  REASON FOR ASSESSMENT:   Consult Assessment of nutrition requirement/status  ASSESSMENT:   Anne Bautista is a 35 y.o. female with medical history significant of IVDA presenting with cough and myalgias.  Everything hurts so bad.  She started with a cyst in her armpit that went away and then she had pain in her shoulder and it went all over.  Her R leg and L foot, ribs, stomach all hurting.  +SOB.  This has been going on for about a week and a half.  +fever up to 105.5.  +cough, nonproductive. She is unable to stand or walk currently.  Pt admitted with severe sepsis with MRSA bacteremia, concern for endocarditis.   Reviewed I/O's: +834 ml x 24 hours and +3.4 L since admission  UOP: 1.6 L x 24 hours  Pt unavailable at time of visit.   Pt history of IVDU. Per PCCM notes, pt with 2 years sobriety, however, with relapse 3 days PTA. Per ID notes, plan for TEE and MRI of lt ankle for concern of septic arthritis.   No meal completion data available at this time.   No wt hx available to assess acute weight changes at this time.   Ot with increased nutritional needs related to acute illness and would benefit from addition of oral nutrition supplements.   Medications reviewed and include folvite, thiamine, and lactated ringers infusion @ 100 ml/hr.   Labs reviewed.   Diet Order:   Diet Order            Diet regular Room service appropriate? Yes; Fluid consistency: Thin  Diet effective now                 EDUCATION  NEEDS:   No education needs have been identified at this time  Skin:  Skin Assessment: Reviewed RN Assessment  Last BM:  Unknown  Height:   Ht Readings from Last 1 Encounters:  11/16/20 5\' 7"  (1.702 m)    Weight:   Wt Readings from Last 1 Encounters:  11/17/20 77 kg    Ideal Body Weight:  61.4 kg  BMI:  Body mass index is 26.59 kg/m.  Estimated Nutritional Needs:   Kcal:  2100-2300  Protein:  115-130 grams  Fluid:  > 2 L    11/19/20, RD, LDN, CDCES Registered Dietitian II Certified Diabetes Care and Education Specialist Please refer to [facility] for RD and/or RD on-call/weekend/after hours pager

## 2020-11-17 NOTE — Progress Notes (Signed)
PROGRESS NOTE    35  GNF:[id number] DOB: 09/11/1986 DOA: 11/15/2020 PCP: Patient, No Pcp Per   Brief Narrative: 35 year old with past medical history significant for IVDA presents with cough and myalgia, generalized pain.  She started with a cyst in her armpit that went away and then she had pain in her shoulder and it went all over.  She also relates shortness of breath.  She has had symptoms for about a week and a half.  She report fevers at 105, dry cough.  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  She acknowledged a multitude of track marks.  She has a prior history of endocarditis 4 years ago, she was only able to complete 28 days before she left AMA. She presented with sepsis likely from endocarditis, heart rate 110s to 140s, respiration rate 30, systolic blood pressure 100s to 130s, white blood cell 15, sodium 124, potassium 2.6.  CTA negative for PE but notable for likely septic emboli involving lungs and kidneys.   Assessment & Plan:   Principal Problem:   Severe sepsis due to methicillin resistant Staphylococcus aureus (MRSA) with acute organ dysfunction (HCC) Active Problems:   IV drug abuse (HCC)   Poor dentition   Malnutrition (HCC)   Thrombocytopenia (HCC)   Tobacco dependence   1-Severe Sepsis secondary to MRSA bacteremia concern for endocarditis: -Continue with IV vancomycin -Received IV fluids. -Patient developed tachycardia, heart rate more than 140, tachypnea increased work of breathing, confused and agitated at times.  Patient received Tylenol, IV metoprolol, IV bolus.  Stat chest x-ray ordered.  CCM consulted for admission to the ICU, patient is high risk for decompensation. -Blood culture growing 2/12 : Gram positive cocci in clusters.   -Transfer to ICU  2-IVDA: On Sub-boxone and clonidine for withdrawal.   3-Hypokalemia, hypoxia natremia, hypercalcemia -replacing.   -IV fluids.   4-Malnutrition:  -started ensure.   5-Thrombocytopenia: Hold anticoagulation. Likely related to sepsis.   6-Tobacco dependence Nicotine patch.   7-Poor dentition: She  needs outpatient dental referral   Estimated body mass index is 26.93 kg/m as calculated from the following:   Height as of this encounter: 5\' 7"  (1.702 m).   Weight as of this encounter: 78 kg.   DVT prophylaxis: SCD Code Status: Full code Family Communication: no family at bedside.  Disposition Plan:  Status is: Inpatient  Remains inpatient appropriate because:Hemodynamically unstable and Persistent severe electrolyte disturbances   Dispo: The patient is from: Home              Anticipated d/c is to: Home              Anticipated d/c date is: > 3 days              Patient currently is not medically stable to d/c.   Difficult to place patient No        Consultants:   CCM  ID  Procedures:   ECHO  Antimicrobials:    Subjective: Confuse, tachypnea, RR 40. Using accessory muscle to breath.   Objective: Vitals:   11/17/20 0012 11/17/20 0119 11/17/20 0515 11/17/20 0732  BP: (!) 109/54 (!) 107/59  137/72  Pulse: (!) 111 (!) 112  (!) 132  Resp: (!) 23 20  20   Temp: 98.7 F (37.1 C) 98 F (36.7 C)  99.9 F (37.7 C)  TempSrc: Oral Oral  Oral  SpO2: 98% 95%  95%  Weight:   78 kg   Height:        Intake/Output Summary (Last 24 hours) at 11/17/2020 0738 Last data filed at 11/17/2020 0500 Gross per 24 hour  Intake 2433.69 ml  Output 1600 ml  Net 833.69 ml   Filed Weights   11/15/20 1227 11/16/20 1147 11/17/20 0515  Weight: 68 kg 71.5 kg 78 kg    Examination:  General exam: Toxic appearing Respiratory system: BL crackle tachypnea.  Cardiovascular system: S1 & S2 heard, RRR. No JVD, murmurs, rubs, gallops or clicks. No pedal edema. Gastrointestinal system: Abdomen is nondistended, soft and nontender. No organomegaly or masses felt. Normal bowel sounds  heard. Central nervous system: confuse, agitated at times Extremities: Symmetric 5 x 5 power. Skin: No rashes, lesions or ulcers   Data Reviewed: I have personally reviewed following labs and imaging studies  CBC: Recent Labs  Lab 11/15/20 1337 11/17/20 0234  WBC 15.3* 8.2  HGB 13.0 10.1*  HCT 36.4 27.9*  MCV 77.1* 77.5*  PLT 51* 40*   Basic Metabolic Panel: Recent Labs  Lab 11/15/20 1735 11/16/20 1037 11/16/20 1930 11/17/20 0234  NA 124* 130* 128* 129*  K 2.6* 2.5* 4.2 3.9  CL 90* 103 101 102  CO2 19* 16* 18* 19*  GLUCOSE 128* 119* 131* 104*  BUN 40* 25* 22* 22*  CREATININE 1.33* 0.84 0.92 0.84  CALCIUM 7.7* 7.0* 7.4* 7.4*  MG 1.8 1.9  --   --    GFR: Estimated Creatinine Clearance: 100.6 mL/min (by C-G formula based on SCr of 0.84 mg/dL). Liver Function Tests: Recent Labs  Lab 11/15/20 1735 11/16/20 1037  AST 23 27  ALT 12 11  ALKPHOS 74 71  BILITOT 0.9 1.7*  PROT 6.5 5.7*  ALBUMIN 2.1* 1.7*   Recent Labs  Lab 11/15/20 1735  LIPASE 20   No results for input(s): AMMONIA in the last 168 hours. Coagulation Profile: No results for input(s): INR, PROTIME in the last 168 hours. Cardiac Enzymes: Recent Labs  Lab 11/15/20 1735  CKTOTAL 43   BNP (last 3 results) No results for input(s): PROBNP in the last 8760 hours. HbA1C: No results for input(s): HGBA1C in the last 72 hours. CBG: Recent Labs  Lab 11/15/20 1535  GLUCAP 145*   Lipid Profile: No results for input(s): CHOL, HDL, LDLCALC, TRIG, CHOLHDL, LDLDIRECT in the last 72 hours. Thyroid Function Tests: No results for input(s): TSH, T4TOTAL, FREET4, T3FREE, THYROIDAB in the last 72 hours. Anemia Panel: No results for input(s): VITAMINB12, FOLATE, FERRITIN, TIBC, IRON, RETICCTPCT in the last 72 hours. Sepsis Labs: Recent Labs  Lab 11/15/20 2135 11/16/20 1531 11/16/20 1930 11/16/20 2234  PROCALCITON  --   --  1.97  --   LATICACIDVEN 2.7* 3.2* 3.6* 3.1*    Recent Results (from the  past 240 hour(s))  Resp Panel by RT-PCR (Flu A&B, Covid) Peripheral     Status: None   Collection Time: 11/15/20  4:10 PM   Specimen: Peripheral; Nasopharyngeal(NP) swabs in vial transport medium  Result Value Ref Range Status   SARS Coronavirus 2 by RT PCR NEGATIVE NEGATIVE Final    Comment: (NOTE) SARS-CoV-2 target nucleic acids are NOT DETECTED.  The SARS-CoV-2 RNA is generally detectable in upper respiratory specimens during the acute phase of infection. The lowest concentration of SARS-CoV-2 viral copies this assay can detect is 138 copies/mL. A negative result does not preclude SARS-Cov-2 infection and should not be used as the  sole basis for treatment or other patient management decisions. A negative result may occur with  improper specimen collection/handling, submission of specimen other than nasopharyngeal swab, presence of viral mutation(s) within the areas targeted by this assay, and inadequate number of viral copies(<138 copies/mL). A negative result must be combined with clinical observations, patient history, and epidemiological information. The expected result is Negative.  Fact Sheet for Patients:  BloggerCourse.com  Fact Sheet for Healthcare Providers:  SeriousBroker.it  This test is no t yet approved or cleared by the [place] FDA and  has been authorized for detection and/or diagnosis of SARS-CoV-2 by FDA under an Emergency Use Authorization (EUA). This EUA will remain  in effect (meaning this test can be used) for the duration of the COVID-19 declaration under Section 564(b)(1) of the Act, 21 U.S.C.section 360bbb-3(b)(1), unless the authorization is terminated  or revoked sooner.       Influenza A by PCR NEGATIVE NEGATIVE Final   Influenza B by PCR NEGATIVE NEGATIVE Final    Comment: (NOTE) The Xpert Xpress SARS-CoV-2/FLU/RSV plus assay is intended as an aid in the diagnosis of influenza from  Nasopharyngeal swab specimens and should not be used as a sole basis for treatment. Nasal washings and aspirates are unacceptable for Xpert Xpress SARS-CoV-2/FLU/RSV testing.  Fact Sheet for Patients: BloggerCourse.com  Fact Sheet for Healthcare Providers: SeriousBroker.it  This test is not yet approved or cleared by the [place] FDA and has been authorized for detection and/or diagnosis of SARS-CoV-2 by FDA under an Emergency Use Authorization (EUA). This EUA will remain in effect (meaning this test can be used) for the duration of the COVID-19 declaration under Section 564(b)(1) of the Act, 21 U.S.C. section 360bbb-3(b)(1), unless the authorization is terminated or revoked.  Performed at [facility], [address] [place], [place] [address]   Blood culture (routine x 2)     Status: None (Preliminary result)   Collection Time: 11/15/20  4:10 PM   Specimen: BLOOD  Result Value Ref Range Status   Specimen Description   Final    BLOOD LEFT ANTECUBITAL Performed at [facility], [address] [place], [place] [address]    Special Requests   Final    BOTTLES DRAWN AEROBIC AND ANAEROBIC Blood Culture adequate volume Performed at [facility], [address] [place], [place] [address]    Culture  Setup Time   Final    GRAM POSITIVE COCCI IN BOTH AEROBIC AND ANAEROBIC BOTTLES CRITICAL RESULT CALLED TO, READ BACK BY AND VERIFIED WITH: CINDY REED,RN @0956  11/16/20 EB Performed at [facility], [address] [place], [place]    Culture GRAM POSITIVE COCCI  Final   Report Status PENDING  Incomplete  Blood Culture ID Panel (Reflexed)     Status: Abnormal   Collection Time: 11/15/20  4:10 PM  Result Value Ref Range Status   Enterococcus faecalis NOT DETECTED NOT DETECTED Final   Enterococcus Faecium NOT DETECTED NOT DETECTED Final   Listeria monocytogenes NOT DETECTED NOT  DETECTED Final   Staphylococcus species DETECTED (A) NOT DETECTED Final    Comment: CRITICAL RESULT CALLED TO, READ BACK BY AND VERIFIED WITH: CINDY REED,RN @0956  11/16/20 EB    Staphylococcus aureus (BCID) DETECTED (A) NOT DETECTED Final    Comment: Methicillin (oxacillin)-resistant Staphylococcus aureus (MRSA). MRSA is predictably resistant to beta-lactam antibiotics (except ceftaroline). Preferred therapy is vancomycin unless clinically contraindicated. Patient requires contact precautions if  hospitalized. CRITICAL RESULT CALLED TO, READ BACK BY AND VERIFIED WITH: CINDY REED,RN @0956  11/16/20 EB    Staphylococcus epidermidis NOT DETECTED NOT DETECTED Final   Staphylococcus lugdunensis NOT DETECTED NOT DETECTED Final   Streptococcus species NOT DETECTED NOT DETECTED Final   Streptococcus agalactiae NOT DETECTED NOT DETECTED Final   Streptococcus pneumoniae NOT DETECTED NOT DETECTED Final   Streptococcus pyogenes NOT DETECTED NOT DETECTED Final   A.calcoaceticus-baumannii NOT DETECTED NOT DETECTED Final   Bacteroides fragilis NOT DETECTED NOT DETECTED Final   Enterobacterales NOT DETECTED NOT DETECTED Final   Enterobacter cloacae complex NOT DETECTED NOT DETECTED Final   Escherichia coli NOT DETECTED NOT DETECTED Final   Klebsiella aerogenes NOT DETECTED NOT DETECTED Final   Klebsiella oxytoca NOT DETECTED NOT DETECTED Final   Klebsiella pneumoniae NOT DETECTED NOT DETECTED Final   Proteus species NOT DETECTED NOT DETECTED Final   Salmonella species NOT DETECTED NOT DETECTED Final   Serratia marcescens NOT DETECTED NOT DETECTED Final   Haemophilus influenzae NOT DETECTED NOT DETECTED Final   Neisseria meningitidis NOT DETECTED NOT DETECTED Final   Pseudomonas aeruginosa NOT DETECTED NOT DETECTED Final   Stenotrophomonas maltophilia NOT DETECTED NOT DETECTED Final   Candida albicans NOT DETECTED NOT DETECTED Final   Candida auris NOT DETECTED NOT DETECTED Final   Candida glabrata NOT  DETECTED NOT DETECTED Final   Candida krusei NOT DETECTED NOT DETECTED Final   Candida parapsilosis NOT DETECTED NOT DETECTED Final   Candida tropicalis NOT DETECTED NOT DETECTED Final   Cryptococcus neoformans/gattii NOT DETECTED NOT DETECTED Final   Meth resistant mecA/C and MREJ DETECTED (A) NOT DETECTED Final    Comment: CRITICAL RESULT CALLED TO, READ BACK BY AND VERIFIED WITH: CINDY REED,RN @0956  11/16/20 EB Performed at [facility], [address], [place], [address] [place]   Blood culture (routine x 2)     Status: None (Preliminary result)   Collection Time: 11/15/20  4:25 PM   Specimen: BLOOD  Result Value Ref Range Status   Specimen Description   Final    BLOOD LEFT ANTECUBITAL Performed at [facility], [address] [place] [address] [place], [address] [place]    Special Requests   Final    BOTTLES DRAWN AEROBIC ONLY Blood Culture results may not be optimal due to an inadequate volume of blood received in culture bottles Performed at [facility], [address] [place], [address] [place]    Culture  Setup Time   Final    GRAM POSITIVE COCCI IN CLUSTERS AEROBIC BOTTLE ONLY Performed at [facility], [address], [place], [address] [place]    Culture [facility] POSITIVE COCCI  Final   Report Status PENDING  Incomplete         Radiology Studies: CT Head Wo Contrast  Result Date: 11/15/2020 CLINICAL DATA:  Classic migraine headache. EXAM: CT HEAD WITHOUT CONTRAST TECHNIQUE: Contiguous axial images were obtained from the base of the skull through the vertex without intravenous contrast. COMPARISON:  07/08/2015 FINDINGS: Brain: The brain shows a normal appearance without evidence of malformation, atrophy, old or acute small or large vessel infarction, mass lesion, hemorrhage, hydrocephalus or extra-axial collection. Vascular: No hyperdense vessel. No evidence of atherosclerotic calcification. Skull: Normal.  No traumatic finding.  No focal bone lesion.  Sinuses/Orbits: Air-fluid level in the right maxillary sinus suggesting rhinosinusitis. Other visualized sinuses are clear. Orbits negative. Other: None significant IMPRESSION: 1. Normal appearance of the brain itself. 2. Air-fluid level  in the right maxillary sinus suggesting rhinosinusitis. Electronically Signed   By: Paulina Fusi M.D.   On: 11/15/2020 20:45   CT Angio Chest PE W and/or Wo Contrast  Result Date: 11/15/2020 CLINICAL DATA:  Body aches, weakness, cough for 1 week EXAM: CT ANGIOGRAPHY CHEST WITH CONTRAST TECHNIQUE: Multidetector CT imaging of the chest was performed using the standard protocol during bolus administration of intravenous contrast. Multiplanar CT image reconstructions and MIPs were obtained to evaluate the vascular anatomy. CONTRAST:  OMNIPAQUE IOHEXOL 350 MG/ML SOLN COMPARISON:  04/16/2016 FINDINGS: Cardiovascular: Satisfactory opacification of the pulmonary arteries to the segmental level. No evidence of pulmonary embolism. Normal heart size. No pericardial effusion. Mediastinum/Nodes: No enlarged mediastinal, hilar, or axillary lymph nodes. Thyroid gland, trachea, and esophagus demonstrate no significant findings. Lungs/Pleura: There are numerous bilateral cavitary nodules throughout the lungs. Interlobular septal thickening. Small left, trace right pleural effusions and associated atelectasis or consolidation. Upper Abdomen: Please see separately reported examination of the abdomen. Musculoskeletal: No chest wall abnormality. No acute or significant osseous findings. Review of the MIP images confirms the above findings. IMPRESSION: 1. Negative examination for pulmonary embolism. 2. There are numerous bilateral cavitary nodules throughout the lungs. These are most consistent with septic emboli; vasculitis and metastatic disease general differential considerations. 3. Small left, trace right pleural effusions and associated atelectasis or consolidation. Electronically Signed    By: Lauralyn Primes M.D.   On: 11/15/2020 20:42   CT ABDOMEN PELVIS W CONTRAST  Result Date: 11/15/2020 CLINICAL DATA:  Abdominal distension, left flank pain EXAM: CT ABDOMEN AND PELVIS WITH CONTRAST TECHNIQUE: Multidetector CT imaging of the abdomen and pelvis was performed using the standard protocol following bolus administration of intravenous contrast. CONTRAST:  OMNIPAQUE IOHEXOL 350 MG/ML SOLN COMPARISON:  03/31/2016 FINDINGS: Lower chest: Please see separately reported examination of the chest. Hepatobiliary: No solid liver abnormality is seen. No gallstones, gallbladder wall thickening, or biliary dilatation. Pancreas: Unremarkable. No pancreatic ductal dilatation or surrounding inflammatory changes. Spleen: Splenomegaly, maximum coronal span 17.4 cm. Adrenals/Urinary Tract: Adrenal glands are unremarkable. There are multiple subtly hypoenhancing lesions of the renal cortices. No hydronephrosis. Bladder is unremarkable. Stomach/Bowel: Stomach is within normal limits. Appendix appears normal. No evidence of bowel wall thickening, distention, or inflammatory changes. Vascular/Lymphatic: No significant vascular findings are present. No enlarged abdominal or pelvic lymph nodes. Reproductive: No mass or other significant abnormality. Other: No abdominal wall hernia or abnormality. No abdominopelvic ascites. Musculoskeletal: No acute or significant osseous findings. IMPRESSION: 1. There are multiple subtly hypoenhancing lesions of the renal cortices. Findings are most in keeping with septic emboli. 2. Splenomegaly, maximum coronal span 17.4 cm. Electronically Signed   By: Lauralyn Primes M.D.   On: 11/15/2020 20:45   DG Chest Portable 1 View  Result Date: 11/15/2020 CLINICAL DATA:  Weakness. Generalized body aches and cough for 1 week. Nausea. EXAM: PORTABLE CHEST 1 VIEW COMPARISON:  04/09/2016 FINDINGS: Cardiac silhouette is normal in size. Normal mediastinal and hilar contours. There are focal  opacities in both lungs that are similar to the prior chest radiograph. Current opacities are likely due to residual scarring from the previous septic emboli. Areas of acute infection, however, are not excluded. Mild reticular opacity at the left lung base is noted consist subsegmental atelectasis or scarring. There are prominent bronchovascular markings, stable. No evidence of pulmonary edema. No convincing pleural effusion or pneumothorax. Skeletal structures are grossly intact. IMPRESSION: 1. Bilateral lung opacities that likely residual scarring from previous septic emboli as noted  on the exams from 04/03/2016. Acute areas infection/inflammation excluded, but felt less likely. No evidence of pulmonary edema. Electronically Signed   By: Amie Portland M.D.   On: 11/15/2020 15:53        Scheduled Meds: . buprenorphine-naloxone  1 tablet Sublingual BID  . cloNIDine  0.1 mg Oral QID   Followed by  . [START ON 11/18/2020] cloNIDine  0.1 mg Oral BH-qamhs   Followed by  . [START ON 11/21/2020] cloNIDine  0.1 mg Oral QAC breakfast  . folic acid  1 mg Oral Daily  . multivitamin with minerals  1 tablet Oral Daily  . sodium chloride flush  3 mL Intravenous Q12H  . thiamine  100 mg Oral Daily   Continuous Infusions: . lactated ringers 100 mL/hr at 11/17/20 0525  . vancomycin 1,000 mg (11/17/20 0108)     LOS: 1 day    Time spent: 35 minutes.     Alba Cory, MD [facility]   If 7PM-7AM, please contact night-coverage [email or web address]  11/17/2020, 7:38 AM

## 2020-11-17 NOTE — Progress Notes (Signed)
  Echocardiogram 2D Echocardiogram has been performed.  Anne Bautista 11/17/2020, 11:25 AM

## 2020-11-17 NOTE — Progress Notes (Addendum)
I have seen and examined the patient. I have personally reviewed the clinical findings, laboratory findings, microbiological data and imaging studies. The assessment and treatment plan was discussed with the  Advance Practice Provider, Jeanine Luz  I agree with her/his recommendations except following additions/corrections.  MRSA bacteremia in an active IVDU with septic pulmonary emboli/renal emboli and splenomegaly with Rt shoulder pain, back pain and left ankle and foot pain/redness and swelling. On Nasal cannula. ? Concerns for withdrawal. Urine cx growing staph aureus likely hemofiltration from the glomerulus.   Continue vancomycin, pharmacy to dose Repeat 2 sets of blood cultures today Fu TTE results and TEE Will need to get MRI of left ankle or joint aspiration for synovial fluid analysis to r/o septic arthritis. 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 TTE pending and will need TEE. Will require additional imaging of right shoulder, back and left ankle when stable. Blood cultures from 2/12 remain pending. Pain control also remains a challenge at present and likely having withdrawal. Currently having access issues. It would be ideal to avoid any central line placement until cultures are clear unless medically necessary. Continue with vancomycin. Monitor renal function and vancomycin levels per protocol.  Continue pain management and management of withdrawal per primary team.   PLAN:  1. Continue vancomycin. 2. Monitor renal function and vancomycin levels per protocol.  3. Await results of TTE and will likely require TEE.  4. Additional imaging of painful areas when stable.  5. Monitor cultures for clearance of bacteremia.  6. Pain management per primary team.   Principal Problem:   Severe sepsis due to methicillin resistant Staphylococcus aureus (MRSA) with acute organ dysfunction (HCC) Active Problems:   IV drug abuse (HCC)   Poor dentition   Malnutrition (HCC)   Thrombocytopenia (HCC)   Tobacco dependence   . buprenorphine-naloxone  1 tablet Sublingual BID  . cloNIDine  0.1 mg Oral QID   Followed by  . [START ON 11/18/2020] cloNIDine  0.1 mg Oral BH-qamhs   Followed by  . [START ON 11/21/2020] cloNIDine  0.1 mg Oral QAC breakfast  . folic acid  1 mg Oral Daily  . multivitamin with minerals  1 tablet Oral Daily  . sodium chloride flush  3 mL Intravenous Q12H  . thiamine  100 mg Oral Daily    SUBJECTIVE:  Febrile and tachycardic with continued pain in multiple areas including chest, right shoulder, back and left ankle. Wants something to drink.   No Known Allergies   Review of Systems: Review of Systems  Constitutional: Negative for chills, fever and weight loss.  Respiratory: Negative for cough, shortness of breath and wheezing.   Cardiovascular: Positive for chest pain. Negative for leg swelling.  Gastrointestinal: Negative for abdominal pain, constipation, diarrhea, nausea and vomiting.  Musculoskeletal: Positive for back pain and joint pain (Right shoulder and left ankle).  Skin: Negative  for rash.      OBJECTIVE: Vitals:   11/17/20 0946 11/17/20 1000 11/17/20 1001 11/17/20 1016  BP: (!) 111/54 122/67 122/67 120/64  Pulse: (!) 137  (!) 132 (!) 134  Resp:      Temp:      TempSrc:      SpO2: 97%  97% 97%  Weight:      Height:       Body mass index is  26.93 kg/m.  Physical Exam Constitutional:      General: She is not in acute distress.    Appearance: She is well-developed and well-nourished. She is ill-appearing.  Cardiovascular:     Rate and Rhythm: Regular rhythm. Tachycardia present.     Pulses: Intact distal pulses.     Heart sounds: Normal heart sounds.  Pulmonary:     Effort: Pulmonary effort is normal. Tachypnea present.     Breath sounds: Normal breath sounds.  Musculoskeletal:     Comments: Pain in right shoulder, back and left ankle. There is edema, mild redness and tenderness of left ankle. Right shoulder with generalized tenderness and no noted edema or redness. Lower back pain bilateral.   Skin:    General: Skin is warm and dry.  Neurological:     Mental Status: She is alert and oriented to person, place, and time.  Psychiatric:        Mood and Affect: Mood and affect normal.        Behavior: Behavior normal.        Thought Content: Thought content normal.        Judgment: Judgment normal.     Lab Results Lab Results  Component Value Date   WBC 8.2 11/17/2020   HGB 10.1 (L) 11/17/2020   HCT 27.9 (L) 11/17/2020   MCV 77.5 (L) 11/17/2020   PLT 40 (L) 11/17/2020    Lab Results  Component Value Date   CREATININE 0.84 11/17/2020   BUN 22 (H) 11/17/2020   NA 129 (L) 11/17/2020   K 3.9 11/17/2020   CL 102 11/17/2020   CO2 19 (L) 11/17/2020    Lab Results  Component Value Date   ALT 11 11/16/2020   AST 27 11/16/2020   ALKPHOS 71 11/16/2020   BILITOT 1.7 (H) 11/16/2020     Microbiology: Recent Results (from the past 240 hour(s))  Resp Panel by RT-PCR (Flu A&B, Covid) Peripheral     Status: None   Collection Time: 11/15/20  4:10 PM   Specimen: Peripheral; Nasopharyngeal(NP) swabs in vial transport medium  Result Value Ref Range Status   SARS Coronavirus 2 by RT PCR NEGATIVE NEGATIVE Final    Comment: (NOTE) SARS-CoV-2 target nucleic acids are NOT DETECTED.  The SARS-CoV-2 RNA is generally  detectable in upper respiratory specimens during the acute phase of infection. The lowest concentration of SARS-CoV-2 viral copies this assay can detect is 138 copies/mL. A negative result does not preclude SARS-Cov-2 infection and should not be used as the sole basis for treatment or other patient management decisions. A negative result may occur with  improper specimen collection/handling, submission of specimen other than nasopharyngeal swab, presence of viral mutation(s) within the areas targeted by this assay, and inadequate number of viral copies(<138 copies/mL). A negative result must be combined with clinical observations, patient history, and epidemiological information. The expected result is Negative.  Fact Sheet for Patients:  BloggerCourse.com  Fact Sheet for Healthcare Providers:  SeriousBroker.it  This test is no t yet approved or cleared  by the [place] and  has been authorized for detection and/or diagnosis of SARS-CoV-2 by FDA under an Emergency Use Authorization (EUA). This EUA will remain  in effect (meaning this test can be used) for the duration of the COVID-19 declaration under Section 564(b)(1) of the Act, 21 U.S.C.section 360bbb-3(b)(1), unless the authorization is terminated  or revoked sooner.       Influenza A by PCR NEGATIVE NEGATIVE Final   Influenza B by PCR NEGATIVE NEGATIVE Final    Comment: (NOTE) The Xpert Xpress SARS-CoV-2/FLU/RSV plus assay is intended as an aid in the diagnosis of influenza from Nasopharyngeal swab specimens and should not be used as a sole basis for treatment. Nasal washings and aspirates are unacceptable for Xpert Xpress SARS-CoV-2/FLU/RSV testing.  Fact Sheet for Patients: BloggerCourse.com  Fact Sheet for Healthcare Providers: SeriousBroker.it  This test is not yet approved or cleared by the [place] FDA  and has been authorized for detection and/or diagnosis of SARS-CoV-2 by FDA under an Emergency Use Authorization (EUA). This EUA will remain in effect (meaning this test can be used) for the duration of the COVID-19 declaration under Section 564(b)(1) of the Act, 21 U.S.C. section 360bbb-3(b)(1), unless the authorization is terminated or revoked.  Performed at [facility], [address] [place], [place] [address]   Blood culture (routine x 2)     Status: Abnormal (Preliminary result)   Collection Time: 11/15/20  4:10 PM   Specimen: BLOOD  Result Value Ref Range Status   Specimen Description   Final    BLOOD LEFT ANTECUBITAL Performed at [facility], [address] [place], [place] [address]    Special Requests   Final    BOTTLES DRAWN AEROBIC AND ANAEROBIC Blood Culture adequate volume Performed at [facility], [address] [place], [place] [address]    Culture  Setup Time   Final    GRAM POSITIVE COCCI IN BOTH AEROBIC AND ANAEROBIC BOTTLES CRITICAL RESULT CALLED TO, READ BACK BY AND VERIFIED WITH: CINDY REED,RN @0956  11/16/20 EB    Culture (A)  Final    STAPHYLOCOCCUS AUREUS SUSCEPTIBILITIES TO FOLLOW Performed at [facility], [address], [place], [place]    Report Status PENDING  Incomplete  Blood Culture ID Panel (Reflexed)     Status: Abnormal   Collection Time: 11/15/20  4:10 PM  Result Value Ref Range Status   Enterococcus faecalis NOT DETECTED NOT DETECTED Final   Enterococcus Faecium NOT DETECTED NOT DETECTED Final   Listeria monocytogenes NOT DETECTED NOT DETECTED Final   Staphylococcus species DETECTED (A) NOT DETECTED Final    Comment: CRITICAL RESULT CALLED TO, READ BACK BY AND VERIFIED WITH: CINDY REED,RN @0956  11/16/20 EB    Staphylococcus aureus (BCID) DETECTED (A) NOT DETECTED Final    Comment: Methicillin (oxacillin)-resistant Staphylococcus aureus (MRSA). MRSA is predictably resistant to  beta-lactam antibiotics (except ceftaroline). Preferred therapy is vancomycin unless clinically contraindicated. Patient requires contact precautions if  hospitalized. CRITICAL RESULT CALLED TO, READ BACK BY AND VERIFIED WITH: CINDY REED,RN @0956  11/16/20 EB    Staphylococcus epidermidis NOT DETECTED NOT DETECTED Final   Staphylococcus lugdunensis NOT DETECTED NOT DETECTED Final   Streptococcus species NOT DETECTED NOT DETECTED Final   Streptococcus agalactiae NOT DETECTED NOT DETECTED Final   Streptococcus pneumoniae NOT DETECTED NOT DETECTED Final   Streptococcus pyogenes NOT DETECTED NOT DETECTED Final   A.calcoaceticus-baumannii NOT DETECTED NOT DETECTED Final  Bacteroides fragilis NOT DETECTED NOT DETECTED Final   Enterobacterales NOT DETECTED NOT DETECTED Final   Enterobacter cloacae complex NOT DETECTED NOT DETECTED Final   Escherichia coli NOT DETECTED NOT DETECTED Final   Klebsiella aerogenes NOT DETECTED NOT DETECTED Final   Klebsiella oxytoca NOT DETECTED NOT DETECTED Final   Klebsiella pneumoniae NOT DETECTED NOT DETECTED Final   Proteus species NOT DETECTED NOT DETECTED Final   Salmonella species NOT DETECTED NOT DETECTED Final   Serratia marcescens NOT DETECTED NOT DETECTED Final   Haemophilus influenzae NOT DETECTED NOT DETECTED Final   Neisseria meningitidis NOT DETECTED NOT DETECTED Final   Pseudomonas aeruginosa NOT DETECTED NOT DETECTED Final   Stenotrophomonas maltophilia NOT DETECTED NOT DETECTED Final   Candida albicans NOT DETECTED NOT DETECTED Final   Candida auris NOT DETECTED NOT DETECTED Final   Candida glabrata NOT DETECTED NOT DETECTED Final   Candida krusei NOT DETECTED NOT DETECTED Final   Candida parapsilosis NOT DETECTED NOT DETECTED Final   Candida tropicalis NOT DETECTED NOT DETECTED Final   Cryptococcus neoformans/gattii NOT DETECTED NOT DETECTED Final   Meth resistant mecA/C and MREJ DETECTED (A) NOT DETECTED Final    Comment: CRITICAL RESULT  CALLED TO, READ BACK BY AND VERIFIED WITH: CINDY REED,RN @0956  11/16/20 EB Performed at [facility], [address] [place], [place] [address]   Blood culture (routine x 2)     Status: Abnormal (Preliminary result)   Collection Time: 11/15/20  4:25 PM   Specimen: BLOOD  Result Value Ref Range Status   Specimen Description   Final    BLOOD LEFT ANTECUBITAL Performed at [facility], [address] [place], [place] [address]    Special Requests   Final    BOTTLES DRAWN AEROBIC ONLY Blood Culture results may not be optimal due to an inadequate volume of blood received in culture bottles Performed at [facility], [address] [place], [place] [address]    Culture  Setup Time   Final    GRAM POSITIVE COCCI IN CLUSTERS AEROBIC BOTTLE ONLY CRITICAL VALUE NOTED.  VALUE IS CONSISTENT WITH PREVIOUSLY REPORTED AND CALLED VALUE. Performed at [facility], [address] [place], [place] [address]    Culture STAPHYLOCOCCUS AUREUS (A)  Final   Report Status PENDING  Incomplete     Marcos EkeGreg Calone, NP [facility]  11/17/2020  10:18 AM

## 2020-11-17 NOTE — Procedures (Signed)
Procedure: Right shoulder aspiration and injection  Indication: Right shoulder pain  Surgeon: Charma Igo, PA-C  Assist: None  Anesthesia: Topical refrigerant  EBL: None  Complications: None  Findings: After risks/benefits explained patient desires to undergo procedure. Consent obtained. The right shoulder was sterilely prepped and aspirated using anterior approach. 26ml turbid yellow fluid obtained. 79ml 0.5% Marcaine instilled. Pt tolerated the procedure well.    Freeman Caldron, PA-C Orthopedic Surgery [phone]

## 2020-11-17 NOTE — Progress Notes (Signed)
Contacted MD on call, Dr. Antionette Char for MEWS score 6. Temp 38.8 C, HR 134, RR 21. Notified of pain that continues and she rates 10/10 and states that she hurts, "everywhere." She is restless in the bed and moaning out. PRN meds given per order and new orders given. See orders. MD came to bedside to assess.

## 2020-11-17 NOTE — Progress Notes (Signed)
PT Cancellation Note  Patient Details Name: Anne Bautista MRN: [id number] DOB: 24-Jun-1986   Cancelled Treatment:    Reason Eval/Treat Not Completed: Medical issues which prohibited therapy Pt with high RR of 54, uncontrolled pain and SOB with any exertion as well as excrutiating reported pain. Pt is too unstable for PT eval and per RN may be transferred to ICU.  Lillia Pauls, PT, DPT Acute Rehabilitation Services Pager [phone] Office [phone]   Norval Morton 11/17/2020, 10:04 AM

## 2020-11-17 NOTE — Consult Note (Signed)
Reason for Consult:Right shoulder pain Referring Physician: Caprice Red Time called: 1517 Time at bedside: 1534   Anne Bautista is an 35 y.o. female.  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 She c/o right shoulder and left ankle pain and concern was raised for septic joint. Orthopedic surgery was asked for consultation. The patient notes her shoulder has been hurting for 2 weeks, the ankle somewhat less than that. She is alternately moaning/whining and asleep so detailed history is not possible.   Past Medical History:  Diagnosis Date  . IV drug user   . Malnutrition (HCC) 11/16/2020  . Poor dentition 11/16/2020  . Tobacco dependence 11/16/2020    History reviewed. No pertinent surgical history.  History reviewed. No pertinent family history.  Social History:  reports that she has been smoking cigarettes. She has a 18.00 pack-year smoking history. She has never used smokeless tobacco. She reports current drug use. Drugs: Cocaine and Marijuana. She reports that she does not drink alcohol.  Allergies: No Known Allergies  Medications: I have reviewed the patient's current medications.  Results for orders placed or performed during the hospital encounter of 11/15/20 (from the past 48 hour(s))  Troponin I (High Sensitivity)     Status: None   Collection Time: 11/15/20  4:10 PM  Result Value Ref Range   Troponin I (High Sensitivity) 12 <18 ng/L    Comment: (NOTE) Elevated high sensitivity troponin I (hsTnI) values and significant  changes across serial measurements may suggest ACS but many other  chronic and acute conditions are known to elevate hsTnI results.  Refer to the "Links" section for chest pain algorithms and additional  guidance. Performed at [facility], [address] [place], [place] [address]   Resp Panel by RT-PCR (Flu A&B, Covid) Peripheral     Status: None   Collection Time: 11/15/20  4:10 PM   Specimen:  Peripheral; Nasopharyngeal(NP) swabs in vial transport medium  Result Value Ref Range   SARS Coronavirus 2 by RT PCR NEGATIVE NEGATIVE    Comment: (NOTE) SARS-CoV-2 target nucleic acids are NOT DETECTED.  The SARS-CoV-2 RNA is generally detectable in upper respiratory specimens during the acute phase of infection. The lowest concentration of SARS-CoV-2 viral copies this assay can detect is 138 copies/mL. A negative result does not preclude SARS-Cov-2 infection and should not be used as the sole basis for treatment or other patient management decisions. A negative result may occur with  improper specimen collection/handling, submission of specimen other than nasopharyngeal swab, presence of viral mutation(s) within the areas targeted by this assay, and inadequate number of viral copies(<138 copies/mL). A negative result must be combined with clinical observations, patient history, and epidemiological information. The expected result is Negative.  Fact Sheet for Patients:  BloggerCourse.com  Fact Sheet for Healthcare Providers:  SeriousBroker.it  This test is no t yet approved or cleared by the [place] FDA and  has been authorized for detection and/or diagnosis of SARS-CoV-2 by FDA under an Emergency Use Authorization (EUA). This EUA will remain  in effect (meaning this test can be used) for the duration of the COVID-19 declaration under Section 564(b)(1) of the Act, 21 U.S.C.section 360bbb-3(b)(1), unless the authorization is terminated  or revoked sooner.       Influenza A by PCR NEGATIVE NEGATIVE   Influenza B by PCR NEGATIVE NEGATIVE    Comment: (NOTE) The Xpert Xpress SARS-CoV-2/FLU/RSV plus assay is intended as an aid  in the diagnosis of influenza from Nasopharyngeal swab specimens and should not be used as a sole basis for treatment. Nasal washings and aspirates are unacceptable for Xpert Xpress  SARS-CoV-2/FLU/RSV testing.  Fact Sheet for Patients: BloggerCourse.com  Fact Sheet for Healthcare Providers: SeriousBroker.it  This test is not yet approved or cleared by the [place] FDA and has been authorized for detection and/or diagnosis of SARS-CoV-2 by FDA under an Emergency Use Authorization (EUA). This EUA will remain in effect (meaning this test can be used) for the duration of the COVID-19 declaration under Section 564(b)(1) of the Act, 21 U.S.C. section 360bbb-3(b)(1), unless the authorization is terminated or revoked.  Performed at [facility], [address] [place], [place] [address]   Blood culture (routine x 2)     Status: Abnormal (Preliminary result)   Collection Time: 11/15/20  4:10 PM   Specimen: BLOOD  Result Value Ref Range   Specimen Description      BLOOD LEFT ANTECUBITAL Performed at [facility], [address] [place], [place] [address]    Special Requests      BOTTLES DRAWN AEROBIC AND ANAEROBIC Blood Culture adequate volume Performed at [facility], [address] [place], [place] [address]    Culture  Setup Time      GRAM POSITIVE COCCI IN BOTH AEROBIC AND ANAEROBIC BOTTLES CRITICAL RESULT CALLED TO, READ BACK BY AND VERIFIED WITH: CINDY REED,RN  11/16/20 EB    Culture (A)     STAPHYLOCOCCUS AUREUS SUSCEPTIBILITIES TO FOLLOW Performed at [facility], [address] [place], [place] [address]    Report Status PENDING   Blood Culture ID Panel (Reflexed)     Status: Abnormal   Collection Time: 11/15/20  4:10 PM  Result Value Ref Range   Enterococcus faecalis NOT DETECTED NOT DETECTED   Enterococcus Faecium NOT DETECTED NOT DETECTED   Listeria monocytogenes NOT DETECTED NOT DETECTED   Staphylococcus species DETECTED (A) NOT DETECTED    Comment: CRITICAL RESULT CALLED TO, READ BACK BY AND VERIFIED WITH: CINDY REED,RN  11/16/20 EB     Staphylococcus aureus (BCID) DETECTED (A) NOT DETECTED    Comment: Methicillin (oxacillin)-resistant Staphylococcus aureus (MRSA). MRSA is predictably resistant to beta-lactam antibiotics (except ceftaroline). Preferred therapy is vancomycin unless clinically contraindicated. Patient requires contact precautions if  hospitalized. CRITICAL RESULT CALLED TO, READ BACK BY AND VERIFIED WITH: CINDY REED,RN  11/16/20 EB    Staphylococcus epidermidis NOT DETECTED NOT DETECTED   Staphylococcus lugdunensis NOT DETECTED NOT DETECTED   Streptococcus species NOT DETECTED NOT DETECTED   Streptococcus agalactiae NOT DETECTED NOT DETECTED   Streptococcus pneumoniae NOT DETECTED NOT DETECTED   Streptococcus pyogenes NOT DETECTED NOT DETECTED   A.calcoaceticus-baumannii NOT DETECTED NOT DETECTED   Bacteroides fragilis NOT DETECTED NOT DETECTED   Enterobacterales NOT DETECTED NOT DETECTED   Enterobacter cloacae complex NOT DETECTED NOT DETECTED   Escherichia coli NOT DETECTED NOT DETECTED   Klebsiella aerogenes NOT DETECTED NOT DETECTED   Klebsiella oxytoca NOT DETECTED NOT DETECTED   Klebsiella pneumoniae NOT DETECTED NOT DETECTED   Proteus species NOT DETECTED NOT DETECTED   Salmonella species NOT DETECTED NOT DETECTED   Serratia marcescens NOT DETECTED NOT DETECTED   Haemophilus influenzae NOT DETECTED NOT DETECTED   Neisseria meningitidis NOT DETECTED NOT DETECTED   Pseudomonas aeruginosa NOT DETECTED NOT DETECTED   Stenotrophomonas maltophilia NOT DETECTED NOT DETECTED   Candida albicans NOT DETECTED NOT DETECTED  Candida auris NOT DETECTED NOT DETECTED   Candida glabrata NOT DETECTED NOT DETECTED   Candida krusei NOT DETECTED NOT DETECTED   Candida parapsilosis NOT DETECTED NOT DETECTED   Candida tropicalis NOT DETECTED NOT DETECTED   Cryptococcus neoformans/gattii NOT DETECTED NOT DETECTED   Meth resistant mecA/C and MREJ DETECTED (A) NOT DETECTED    Comment: CRITICAL RESULT CALLED  TO, READ BACK BY AND VERIFIED WITH: CINDY REED,RN @0956  11/16/20 EB Performed at [facility], [address], [place], [place]   Blood culture (routine x 2)     Status: Abnormal (Preliminary result)   Collection Time: 11/15/20  4:25 PM   Specimen: BLOOD  Result Value Ref Range   Specimen Description      BLOOD LEFT ANTECUBITAL Performed at [facility], [address] [place], [place]    Special Requests      BOTTLES DRAWN AEROBIC ONLY Blood Culture results may not be optimal due to an inadequate volume of blood received in culture bottles Performed at [facility], [address] [place], [place]    Culture  Setup Time      GRAM POSITIVE COCCI IN CLUSTERS AEROBIC BOTTLE ONLY CRITICAL VALUE NOTED.  VALUE IS CONSISTENT WITH PREVIOUSLY REPORTED AND CALLED VALUE. Performed at [facility], [address], [place], [place]    Culture STAPHYLOCOCCUS AUREUS (A)    Report Status PENDING   Basic metabolic panel     Status: Abnormal   Collection Time: 11/15/20  5:35 PM  Result Value Ref Range   Sodium 124 (L) 135 - 145 mmol/L   Potassium 2.6 (LL) 3.5 - 5.1 mmol/L    Comment: CRITICAL RESULT CALLED TO, READ BACK BY AND VERIFIED WITH: REED.C,RN @ 1836 ON 11/15/2020, CABELLERO.P    Chloride 90 (L) 98 - 111 mmol/L   CO2 19 (L) 22 - 32 mmol/L   Glucose, Bld 128 (H) 70 - 99 mg/dL    Comment: Glucose reference range applies only to samples taken after fasting for at least 8 hours.   BUN 40 (H) 6 - 20 mg/dL   Creatinine, Ser 01/13/2021 (H) 0.44 - 1.00 mg/dL   Calcium 7.7 (L) 8.9 - 10.3 mg/dL   GFR, Estimated 54 (L) >60 mL/min    Comment: (NOTE) Calculated using the CKD-EPI Creatinine Equation (2021)    Anion gap 15 5 - 15    Comment: Performed at [facility], [address] [place], [place]  CK     Status: None   Collection Time: 11/15/20  5:35 PM  Result Value Ref Range   Total CK 43 38 - 234  U/L    Comment: Performed at [facility], [address] [place], [place]  Hepatic function panel     Status: Abnormal   Collection Time: 11/15/20  5:35 PM  Result Value Ref Range   Total Protein 6.5 6.5 - 8.1 g/dL   Albumin 2.1 (L) 3.5 - 5.0 g/dL   AST 23 15 - 41 U/L   ALT 12 0 - 44 U/L   Alkaline Phosphatase 74 38 - 126 U/L   Total Bilirubin 0.9 0.3 - 1.2 mg/dL   Bilirubin, Direct 0.6 (H) 0.0 - 0.2 mg/dL   Indirect Bilirubin 0.3 0.3 - 0.9 mg/dL    Comment: Performed at [facility], [address] [place], [place]  Lipase, blood     Status: None   Collection Time: 11/15/20  5:35 PM  Result Value Ref Range   Lipase 20 11 - 51 U/L    Comment: Performed at [facility], [address] [place], [place] [address]  Magnesium     Status: None   Collection Time: 11/15/20  5:35 PM  Result Value Ref Range   Magnesium 1.8 1.7 - 2.4 mg/dL    Comment: Performed at [facility], [address] [place], [place] [address]  Lactic acid, plasma     Status: Abnormal   Collection Time: 11/15/20  6:45 PM  Result Value Ref Range   Lactic Acid, Venous 3.6 (HH) 0.5 - 1.9 mmol/L    Comment: CRITICAL RESULT CALLED TO, READ BACK BY AND VERIFIED WITH: MAYNARD.S,RN @ 1922 ON 11/15/2020, CABELLERO.P Performed at [facility], [address] [place], [place] [address]   Pregnancy, urine     Status: None   Collection Time: 11/15/20  7:47 PM  Result Value Ref Range   Preg Test, Ur NEGATIVE NEGATIVE    Comment:        THE SENSITIVITY OF THIS METHODOLOGY IS >20 mIU/mL. Performed at [facility], [address] [place], [place] [address]   Urinalysis, Routine w reflex microscopic Urine, Clean Catch     Status: Abnormal   Collection Time: 11/15/20  7:47 PM  Result Value Ref Range   Color, Urine YELLOW YELLOW   APPearance HAZY (A) CLEAR   Specific Gravity, Urine 1.020 1.005 - 1.030   pH 6.0 5.0 - 8.0   Glucose,  UA NEGATIVE NEGATIVE mg/dL   Hgb urine dipstick MODERATE (A) NEGATIVE   Bilirubin Urine NEGATIVE NEGATIVE   Ketones, ur NEGATIVE NEGATIVE mg/dL   Protein, ur 30 (A) NEGATIVE mg/dL   Nitrite POSITIVE (A) NEGATIVE   Leukocytes,Ua TRACE (A) NEGATIVE    Comment: Performed at [facility], [address] [place], [place] [address]  Rapid urine drug screen (hospital performed)     Status: Abnormal   Collection Time: 11/15/20  7:47 PM  Result Value Ref Range   Opiates NONE DETECTED NONE DETECTED   Cocaine POSITIVE (A) NONE DETECTED   Benzodiazepines NONE DETECTED NONE DETECTED   Amphetamines NONE DETECTED NONE DETECTED   Tetrahydrocannabinol NONE DETECTED NONE DETECTED   Barbiturates NONE DETECTED NONE DETECTED    Comment: (NOTE) DRUG SCREEN FOR MEDICAL PURPOSES ONLY.  IF CONFIRMATION IS NEEDED FOR ANY PURPOSE, NOTIFY LAB WITHIN 5 DAYS.  LOWEST DETECTABLE LIMITS FOR URINE DRUG SCREEN Drug Class                     Cutoff (ng/mL) Amphetamine and metabolites    1000 Barbiturate and metabolites    200 Benzodiazepine                 200 Tricyclics and metabolites     300 Opiates and metabolites        300 Cocaine and metabolites        300 THC                            50 Performed at [facility], [address] [place], [place] [address]   Urinalysis, Microscopic (reflex)     Status: Abnormal   Collection Time: 11/15/20  7:47 PM  Result Value  Ref Range   RBC / HPF 0-5 0 - 5 RBC/hpf   WBC, UA 6-10 0 - 5 WBC/hpf   Bacteria, UA MANY (A) NONE SEEN   Squamous Epithelial / LPF 0-5 0 - 5   Hyaline Casts, UA PRESENT    Amorphous Crystal PRESENT     Comment: Performed at [facility], [address] [place], [place] [address]  Urine culture     Status: Abnormal (Preliminary result)   Collection Time: 11/15/20  7:47 PM   Specimen: Urine, Clean Catch  Result Value Ref Range   Specimen Description      URINE, CATHETERIZED Performed at [facility], [address] [place], [place] [address]    Special Requests      Normal Performed at [facility], [address] [place], [place] [address]    Culture (A)     >=100,000 COLONIES/mL STAPHYLOCOCCUS AUREUS SUSCEPTIBILITIES TO FOLLOW Performed at [facility], [address], [place], [place] [address]    Report Status PENDING   Lactic acid, plasma     Status: Abnormal   Collection Time: 11/15/20  9:35 PM  Result Value Ref Range   Lactic Acid, Venous 2.7 (HH) 0.5 - 1.9 mmol/L    Comment: CRITICAL RESULT CALLED TO, READ BACK BY AND VERIFIED WITH: MAYNARD,C AT 2215 ON [id number] BY CHERESNOWSKY,T Performed at [facility], [address] [place], [place] [address]   Culture, blood (single)     Status: None (Preliminary result)   Collection Time: 11/15/20  9:35 PM   Specimen: BLOOD RIGHT HAND  Result Value Ref Range   Specimen Description      BLOOD RIGHT HAND Performed at [facility], [address] [place], [place] [address]    Special Requests      BOTTLES DRAWN AEROBIC AND ANAEROBIC Blood Culture adequate volume Performed at [facility], [address] [place], [place] [address]    Culture      NO GROWTH < 24 HOURS Performed at [facility], [address] [place], [place] [address]    Report Status PENDING   Comprehensive metabolic panel     Status: Abnormal   Collection Time: 11/16/20 10:37 AM  Result Value Ref Range   Sodium 130 (L) 135 - 145 mmol/L   Potassium 2.5 (LL) 3.5 - 5.1 mmol/L    Comment: CRITICAL RESULT CALLED TO, READ BACK BY AND VERIFIED WITH: ROUGHGARDEN, C, RN AT 1103 ON [id number] BY BOWLBY, J    Chloride 103 98 - 111 mmol/L   CO2 16 (L) 22 - 32 mmol/L   Glucose, Bld 119 (H) 70 - 99 mg/dL    Comment: Glucose reference range applies only to samples taken after fasting for at least 8 hours.   BUN 25 (H) 6 - 20 mg/dL   Creatinine, Ser 8.67 0.44 - 1.00 mg/dL   Calcium 7.0 (L) 8.9  - 10.3 mg/dL   Total Protein 5.7 (L) 6.5 - 8.1 g/dL   Albumin 1.7 (L) 3.5 - 5.0 g/dL   AST 27 15 - 41 U/L   ALT 11 0 - 44 U/L   Alkaline Phosphatase 71 38 - 126 U/L   Total Bilirubin 1.7 (H) 0.3 - 1.2 mg/dL   GFR, Estimated >67 >20 mL/min    Comment: (NOTE) Calculated using the CKD-EPI Creatinine Equation (2021)    Anion gap 11 5 -  15    Comment: Performed at [facility], [address] [place], [place] [address]  Magnesium     Status: None   Collection Time: 11/16/20 10:37 AM  Result Value Ref Range   Magnesium 1.9 1.7 - 2.4 mg/dL    Comment: Performed at [facility], [address] [place], [place] [address]  HIV Antibody (routine testing w rflx)     Status: None   Collection Time: 11/16/20  3:31 PM  Result Value Ref Range   HIV Screen 4th Generation wRfx Non Reactive Non Reactive    Comment: Performed at [facility], [address], [place], [place] [address]  Lactic acid, plasma     Status: Abnormal   Collection Time: 11/16/20  3:31 PM  Result Value Ref Range   Lactic Acid, Venous 3.2 (HH) 0.5 - 1.9 mmol/L    Comment: CRITICAL RESULT CALLED TO, READ BACK BY AND VERIFIED WITH: ALLISON VOLA RN.@1620  ON 2.13.22 BY TCALDWELL MT. Performed at [facility], [address] [place], [place] [address]   RPR     Status: None   Collection Time: 11/16/20  3:31 PM  Result Value Ref Range   RPR Ser Ql NON REACTIVE NON REACTIVE    Comment: Performed at [facility], [address] [place], [place] [address]  Basic metabolic panel     Status: Abnormal   Collection Time: 11/16/20  7:30 PM  Result Value Ref Range   Sodium 128 (L) 135 - 145 mmol/L   Potassium 4.2 3.5 - 5.1 mmol/L   Chloride 101 98 - 111 mmol/L   CO2 18 (L) 22 - 32 mmol/L   Glucose, Bld 131 (H) 70 - 99 mg/dL    Comment: Glucose reference range applies only to samples taken after fasting for at least 8 hours.   BUN 22 (H) 6 - 20 mg/dL   Creatinine, Ser 1.30 0.44 - 1.00 mg/dL    Calcium 7.4 (L) 8.9 - 10.3 mg/dL   GFR, Estimated >86 >57 mL/min    Comment: (NOTE) Calculated using the CKD-EPI Creatinine Equation (2021)    Anion gap 9 5 - 15    Comment: Performed at [facility], [address], [place], [place] [address]  Procalcitonin     Status: None   Collection Time: 11/16/20  7:30 PM  Result Value Ref Range   Procalcitonin 1.97 ng/mL    Comment:        Interpretation: PCT > 0.5 ng/mL and <= 2 ng/mL: Systemic infection (sepsis) is possible, but other conditions are known to elevate PCT as well. (NOTE)       Sepsis PCT Algorithm           Lower Respiratory Tract                                      Infection PCT Algorithm    ----------------------------     ----------------------------         PCT < 0.25 ng/mL                PCT < 0.10 ng/mL          Strongly encourage             Strongly discourage   discontinuation of antibiotics    initiation of antibiotics    ----------------------------     -----------------------------  PCT 0.25 - 0.50 ng/mL            PCT 0.10 - 0.25 ng/mL               OR       >80% decrease in PCT            Discourage initiation of                                            antibiotics      Encourage discontinuation           of antibiotics    ----------------------------     -----------------------------         PCT >= 0.50 ng/mL              PCT 0.26 - 0.50 ng/mL                AND       <80% decrease in PCT             Encourage initiation of                                             antibiotics       Encourage continuation           of antibiotics    ----------------------------     -----------------------------        PCT >= 0.50 ng/mL                  PCT > 0.50 ng/mL               AND         increase in PCT                  Strongly encourage                                      initiation of antibiotics    Strongly encourage escalation           of antibiotics                                      -----------------------------                                           PCT <= 0.25 ng/mL                                                 OR                                        > 80% decrease in PCT  Discontinue / Do not initiate                                             antibiotics  Performed at [facility], [address] [place], [place] [address]   Lactic acid, plasma     Status: Abnormal   Collection Time: 11/16/20  7:30 PM  Result Value Ref Range   Lactic Acid, Venous 3.6 (HH) 0.5 - 1.9 mmol/L    Comment: CRITICAL VALUE NOTED.  VALUE IS CONSISTENT WITH PREVIOUSLY REPORTED AND CALLED VALUE. Performed at [facility], [address] [place], [place] [address]   Lactic acid, plasma     Status: Abnormal   Collection Time: 11/16/20 10:34 PM  Result Value Ref Range   Lactic Acid, Venous 3.1 (HH) 0.5 - 1.9 mmol/L    Comment: CRITICAL VALUE NOTED.  VALUE IS CONSISTENT WITH PREVIOUSLY REPORTED AND CALLED VALUE. Performed at [facility], [address], [place], [place] [address]   Basic metabolic panel     Status: Abnormal   Collection Time: 11/17/20  2:34 AM  Result Value Ref Range   Sodium 129 (L) 135 - 145 mmol/L   Potassium 3.9 3.5 - 5.1 mmol/L   Chloride 102 98 - 111 mmol/L   CO2 19 (L) 22 - 32 mmol/L   Glucose, Bld 104 (H) 70 - 99 mg/dL    Comment: Glucose reference range applies only to samples taken after fasting for at least 8 hours.   BUN 22 (H) 6 - 20 mg/dL   Creatinine, Ser 5.78 0.44 - 1.00 mg/dL   Calcium 7.4 (L) 8.9 - 10.3 mg/dL   GFR, Estimated >46 >96 mL/min    Comment: (NOTE) Calculated using the CKD-EPI Creatinine Equation (2021)    Anion gap 8 5 - 15    Comment: Performed at [facility], [address], [place], [place] [address]  CBC     Status: Abnormal   Collection Time: 11/17/20  2:34 AM  Result Value Ref Range   WBC 8.2 4.0 - 10.5 K/uL   RBC 3.60 (L) 3.87 - 5.11 MIL/uL    Hemoglobin 10.1 (L) 12.0 - 15.0 g/dL   HCT 41.3 (L) 24.4 - 01.0 %   MCV 77.5 (L) 80.0 - 100.0 fL   MCH 28.1 26.0 - 34.0 pg   MCHC 36.2 (H) 30.0 - 36.0 g/dL   RDW 27.2 53.6 - 64.4 %   Platelets 40 (L) 150 - 400 K/uL    Comment: REPEATED TO VERIFY PLATELET COUNT CONFIRMED BY SMEAR SPECIMEN CHECKED FOR CLOTS Immature Platelet Fraction may be clinically indicated, consider ordering this additional test [id number]    nRBC 0.0 0.0 - 0.2 %    Comment: Performed at [facility], [address] [place], [place] [address]  Culture, blood (routine x 2)     Status: None (Preliminary result)   Collection Time: 11/17/20  9:28 AM   Specimen: BLOOD RIGHT FOREARM  Result Value Ref Range   Specimen Description BLOOD RIGHT FOREARM    Special Requests      BOTTLES DRAWN AEROBIC AND ANAEROBIC Blood Culture results may not be optimal due to an inadequate volume of blood received in culture bottles   Culture      NO GROWTH < 12 HOURS Performed at [facility], [address] [place], [place] [id number]    Report Status PENDING   Culture, blood (routine x 2)     Status: None (Preliminary result)   Collection Time: 11/17/20  9:39 AM   Specimen: BLOOD RIGHT HAND  Result Value Ref Range   Specimen Description BLOOD RIGHT HAND    Special Requests      BOTTLES DRAWN AEROBIC AND ANAEROBIC Blood Culture results may not be optimal due to an inadequate volume of blood received in culture bottles   Culture      NO GROWTH < 12 HOURS Performed at [facility], [address] [place], [place] [address]    Report Status PENDING   DIC Panel (Not at [facility]) ONCE - STAT     Status: Abnormal   Collection Time: 11/17/20 10:19 AM  Result Value Ref Range   Prothrombin Time 16.6 (H) 11.4 - 15.2 seconds   INR 1.4 (H) 0.8 - 1.2    Comment: (NOTE) INR goal varies based on device and disease states.    aPTT 31 24 - 36 seconds   Fibrinogen 452 210 - 475 mg/dL   D-Dimer, Quant 8.294.17 (H) 0.00 - 0.50  ug/mL-FEU    Comment: (NOTE) At the manufacturer cut-off value of 0.5 g/mL FEU, this assay has a negative predictive value of 95-100%.This assay is intended for use in conjunction with a clinical pretest probability (PTP) assessment model to exclude pulmonary embolism (PE) and deep venous thrombosis (DVT) in outpatients suspected of PE or DVT. Results should be correlated with clinical presentation.    Platelets 53 (L) 150 - 400 K/uL    Comment: REPEATED TO VERIFY PLATELET COUNT CONFIRMED BY SMEAR    Smear Review NO SCHISTOCYTES SEEN     Comment: Performed at [facility], [address] [place], [place] [address]  Lactic acid, plasma     Status: Abnormal   Collection Time: 11/17/20 10:19 AM  Result Value Ref Range   Lactic Acid, Venous 2.7 (HH) 0.5 - 1.9 mmol/L    Comment: CRITICAL VALUE NOTED.  VALUE IS CONSISTENT WITH PREVIOUSLY REPORTED AND CALLED VALUE. Performed at [facility], [address] [place], [place] [address]   Protime-INR     Status: Abnormal   Collection Time: 11/17/20 10:19 AM  Result Value Ref Range   Prothrombin Time 16.7 (H) 11.4 - 15.2 seconds   INR 1.4 (H) 0.8 - 1.2    Comment: (NOTE) INR goal varies based on device and disease states. Performed at [facility], [address] [place], [place] [address]   MRSA PCR Screening     Status: Abnormal   Collection Time: 11/17/20 11:38 AM   Specimen: Nasopharyngeal  Result Value Ref Range   MRSA by PCR POSITIVE (A) NEGATIVE    Comment:        The GeneXpert MRSA Assay (FDA approved for NASAL specimens only), is one component of a comprehensive MRSA colonization surveillance program. It is not intended to diagnose MRSA infection nor to guide or monitor treatment for MRSA infections. RESULT CALLED TO, READ BACK BY AND VERIFIED WITH: R ROBERTS RN 1400 11/17/20 A BROWNING Performed at [facility], [address] [place], [place] [address]   Lactic acid, plasma     Status:  Abnormal   Collection Time: 11/17/20 11:50 AM  Result Value Ref Range   Lactic Acid, Venous 2.1 (HH) 0.5 - 1.9 mmol/L    Comment: CRITICAL VALUE NOTED.  VALUE IS CONSISTENT WITH PREVIOUSLY REPORTED AND CALLED VALUE. Performed at [facility], [address] [place], [place] [address]   Hepatic function panel     Status: Abnormal   Collection Time: 11/17/20 11:50 AM  Result Value Ref Range   Total Protein 5.5 (L) 6.5 - 8.1 g/dL   Albumin 1.3 (L) 3.5 - 5.0 g/dL   AST 19 15 - 41 U/L   ALT 11 0 - 44 U/L   Alkaline Phosphatase 73 38 - 126 U/L   Total Bilirubin 1.5 (H) 0.3 - 1.2 mg/dL   Bilirubin, Direct 0.8 (H) 0.0 - 0.2 mg/dL   Indirect Bilirubin 0.7 0.3 - 0.9 mg/dL    Comment: Performed at [facility], [address] [place], [place] [address]    CT Head Wo Contrast  Result Date: 11/15/2020 CLINICAL DATA:  Classic migraine headache. EXAM: CT HEAD WITHOUT CONTRAST TECHNIQUE: Contiguous axial images were obtained from the base of the skull through the vertex without intravenous contrast. COMPARISON:  07/08/2015 FINDINGS: Brain: The brain shows a normal appearance without evidence of malformation, atrophy, old or acute small or large vessel infarction, mass lesion, hemorrhage, hydrocephalus or extra-axial collection. Vascular: No hyperdense vessel. No evidence of atherosclerotic calcification. Skull: Normal.  No traumatic finding.  No focal bone lesion. Sinuses/Orbits: Air-fluid level in the right maxillary sinus suggesting rhinosinusitis. Other visualized sinuses are clear. Orbits negative. Other: None significant IMPRESSION: 1. Normal appearance of the brain itself. 2. Air-fluid level in the right maxillary sinus suggesting rhinosinusitis. Electronically Signed   By: Paulina Fusi M.D.   On: 11/15/2020 20:45   CT Angio Chest PE W and/or Wo Contrast  Result Date: 11/15/2020 CLINICAL DATA:  Body aches, weakness, cough for 1 week EXAM: CT ANGIOGRAPHY CHEST WITH CONTRAST TECHNIQUE:  Multidetector CT imaging of the chest was performed using the standard protocol during bolus administration of intravenous contrast. Multiplanar CT image reconstructions and MIPs were obtained to evaluate the vascular anatomy. CONTRAST:  OMNIPAQUE IOHEXOL 350 MG/ML SOLN COMPARISON:  04/16/2016 FINDINGS: Cardiovascular: Satisfactory opacification of the pulmonary arteries to the segmental level. No evidence of pulmonary embolism. Normal heart size. No pericardial effusion. Mediastinum/Nodes: No enlarged mediastinal, hilar, or axillary lymph nodes. Thyroid gland, trachea, and esophagus demonstrate no significant findings. Lungs/Pleura: There are numerous bilateral cavitary nodules throughout the lungs. Interlobular septal thickening. Small left, trace right pleural effusions and associated atelectasis or consolidation. Upper Abdomen: Please see separately reported examination of the abdomen. Musculoskeletal: No chest wall abnormality. No acute or significant osseous findings. Review of the MIP images confirms the above findings. IMPRESSION: 1. Negative examination for pulmonary embolism. 2. There are numerous bilateral cavitary nodules throughout the lungs. These are most consistent with septic emboli; vasculitis and metastatic disease general differential considerations. 3. Small left, trace right pleural effusions and associated atelectasis or consolidation. Electronically Signed   By: Lauralyn Primes M.D.   On: 11/15/2020 20:42   CT ABDOMEN PELVIS W CONTRAST  Result Date: 11/15/2020 CLINICAL DATA:  Abdominal distension, left flank pain EXAM: CT ABDOMEN AND PELVIS WITH CONTRAST TECHNIQUE: Multidetector CT imaging of the abdomen and pelvis was performed using the standard protocol following bolus administration of intravenous contrast. CONTRAST:  OMNIPAQUE IOHEXOL 350 MG/ML SOLN COMPARISON:  03/31/2016 FINDINGS: Lower chest: Please see separately reported examination of the chest. Hepatobiliary: No solid  liver abnormality is seen. No gallstones, gallbladder wall thickening, or biliary dilatation. Pancreas: Unremarkable. No pancreatic ductal dilatation or surrounding inflammatory changes. Spleen:  Splenomegaly, maximum coronal span 17.4 cm. Adrenals/Urinary Tract: Adrenal glands are unremarkable. There are multiple subtly hypoenhancing lesions of the renal cortices. No hydronephrosis. Bladder is unremarkable. Stomach/Bowel: Stomach is within normal limits. Appendix appears normal. No evidence of bowel wall thickening, distention, or inflammatory changes. Vascular/Lymphatic: No significant vascular findings are present. No enlarged abdominal or pelvic lymph nodes. Reproductive: No mass or other significant abnormality. Other: No abdominal wall hernia or abnormality. No abdominopelvic ascites. Musculoskeletal: No acute or significant osseous findings. IMPRESSION: 1. There are multiple subtly hypoenhancing lesions of the renal cortices. Findings are most in keeping with septic emboli. 2. Splenomegaly, maximum coronal span 17.4 cm. Electronically Signed   By: Lauralyn Primes M.D.   On: 11/15/2020 20:45   DG CHEST PORT 1 VIEW  Result Date: 11/17/2020 CLINICAL DATA:  Shortness of breath. EXAM: PORTABLE CHEST 1 VIEW COMPARISON:  Chest radiograph and CT chest 11/15/2020. FINDINGS: Trachea is midline. Heart size stable. Nodular peripheral airspace consolidation appears increased in the interval. Probable trace bilateral pleural effusions. IMPRESSION: 1. Worsening peripheral nodular airspace consolidation, suspicious for septic emboli. 2. Trace bilateral pleural effusions. Electronically Signed   By: Leanna Battles M.D.   On: 11/17/2020 09:20   ECHOCARDIOGRAM COMPLETE  Result Date: 11/17/2020    ECHOCARDIOGRAM REPORT   Patient Name:   KAELIE HENIGAN Date of Exam: 11/17/2020 Medical Rec #:  [id number]   Height:       67.0 in Accession #:    [id number]  Weight:       172.0 lb Date of Birth:  01/09/1986    BSA:          1.896 m  Patient Age:    34 years    BP:           120/64 mmHg Patient Gender: F           HR:           132 bpm. Exam Location:  Inpatient Procedure: 2D Echo, Color Doppler and Cardiac Doppler REPORT CONTAINS CRITICAL RESULT Indications:    Endocarditis.; R00.0 Tachycardia  History:        Patient has no prior history of Echocardiogram examinations.                 Signs/Symptoms:Shortness of Breath; Risk Factors:Current Smoker.                 IVDU.  Sonographer:    Sheralyn Boatman RDCS Referring Phys: [id number] JENNIFER YATES  Sonographer Comments: Technically difficult study due to poor echo windows. Patient moaning in pain throughout test IMPRESSIONS  1. Left ventricular ejection fraction, by estimation, is 55 to 60%. The left ventricle has normal function. The left ventricle has no regional wall motion abnormalities. Left ventricular diastolic parameters are consistent with Grade I diastolic dysfunction (impaired relaxation).  2. Right ventricular systolic function is normal. The right ventricular size is normal. There is normal pulmonary artery systolic pressure. The estimated right ventricular systolic pressure is 22.4 mmHg.  3. The mitral valve is normal in structure. No evidence of mitral valve regurgitation. No evidence of mitral stenosis.  4. 2 x 1 cm mobile vegetation attached to the tricuspid valve.  5. The aortic valve is tricuspid. Aortic valve regurgitation is not visualized. No aortic stenosis is present.  6. The inferior vena cava is normal in size with greater than 50% respiratory variability, suggesting right atrial pressure of 3 mmHg. FINDINGS  Left Ventricle: Left ventricular ejection fraction, by estimation, is 55 to  60%. The left ventricle has normal function. The left ventricle has no regional wall motion abnormalities. The left ventricular internal cavity size was normal in size. There is  no left ventricular hypertrophy. Left ventricular diastolic parameters are consistent with Grade I diastolic dysfunction  (impaired relaxation). Right Ventricle: The right ventricular size is normal. No increase in right ventricular wall thickness. Right ventricular systolic function is normal. There is normal pulmonary artery systolic pressure. The tricuspid regurgitant velocity is 2.20 m/s, and  with an assumed right atrial pressure of 3 mmHg, the estimated right ventricular systolic pressure is 22.4 mmHg. Left Atrium: Left atrial size was normal in size. Right Atrium: Right atrial size was normal in size. Pericardium: There is no evidence of pericardial effusion. Mitral Valve: The mitral valve is normal in structure. There is mild calcification of the mitral valve leaflet(s). No evidence of mitral valve regurgitation. No evidence of mitral valve stenosis. Tricuspid Valve: 2 x 1 cm mobile vegetation attached to the tricuspid valve. The tricuspid valve is normal in structure. Tricuspid valve regurgitation is trivial. No evidence of tricuspid stenosis. Aortic Valve: The aortic valve is tricuspid. Aortic valve regurgitation is not visualized. No aortic stenosis is present. Pulmonic Valve: The pulmonic valve was normal in structure. Pulmonic valve regurgitation is not visualized. Aorta: The aortic root is normal in size and structure. Venous: The inferior vena cava is normal in size with greater than 50% respiratory variability, suggesting right atrial pressure of 3 mmHg. IAS/Shunts: No atrial level shunt detected by color flow Doppler.  LEFT VENTRICLE PLAX 2D LVIDd:         4.70 cm     Diastology LVIDs:         3.20 cm     LV e' medial:    10.10 cm/s LV PW:         1.00 cm     LV E/e' medial:  10.6 LV IVS:        1.10 cm     LV e' lateral:   12.70 cm/s LVOT diam:     1.80 cm     LV E/e' lateral: 8.4 LV SV:         60 LV SV Index:   32 LVOT Area:     2.54 cm  LV Volumes (MOD) LV vol d, MOD A2C: 30.6 ml LV vol d, MOD A4C: 63.2 ml LV vol s, MOD A2C: 13.7 ml LV vol s, MOD A4C: 23.5 ml LV SV MOD A2C:     16.9 ml LV SV MOD A4C:     63.2 ml  LV SV MOD BP:      26.3 ml RIGHT VENTRICLE             IVC RV S prime:     15.20 cm/s  IVC diam: 1.70 cm TAPSE (M-mode): 1.8 cm LEFT ATRIUM             Index      RIGHT ATRIUM          Index LA diam:        3.00 cm 1.58 cm/m RA Area:     9.90 cm LA Vol (A2C):   11.5 ml 6.06 ml/m RA Volume:   19.70 ml 10.39 ml/m LA Vol (A4C):   17.1 ml 9.02 ml/m LA Biplane Vol: 15.2 ml 8.01 ml/m  AORTIC VALVE LVOT Vmax:   178.00 cm/s LVOT Vmean:  123.000 cm/s LVOT VTI:    0.236 m  AORTA Ao Root diam:  2.90 cm Ao Asc diam:  2.80 cm MITRAL VALVE                TRICUSPID VALVE MV Area (PHT): 4.31 cm     TR Peak grad:   19.4 mmHg MV Decel Time: 176 msec     TR Vmax:        220.00 cm/s MV E velocity: 107.00 cm/s MV A velocity: 107.00 cm/s  SHUNTS MV E/A ratio:  1.00         Systemic VTI:  0.24 m                             Systemic Diam: 1.80 cm Marca Ancona MD Electronically signed by Marca Ancona MD Signature Date/Time: 11/17/2020/3:36:26 PM    Final    VAS [place] LOWER EXTREMITY VENOUS (DVT)  Result Date: 11/17/2020  Lower Venous DVT Study Indications: Swelling, LT>RT.  Comparison Study: No prior studies. Performing Technologist: Jean Rosenthal RDMS  Examination Guidelines: A complete evaluation includes B-mode imaging, spectral Doppler, color Doppler, and power Doppler as needed of all accessible portions of each vessel. Bilateral testing is considered an integral part of a complete examination. Limited examinations for reoccurring indications may be performed as noted. The reflux portion of the exam is performed with the patient in reverse Trendelenburg.  +---------+---------------+---------+-----------+----------+--------------+ RIGHT    CompressibilityPhasicitySpontaneityPropertiesThrombus Aging +---------+---------------+---------+-----------+----------+--------------+ CFV      Full           Yes      Yes                                 +---------+---------------+---------+-----------+----------+--------------+  SFJ      Full                                                        +---------+---------------+---------+-----------+----------+--------------+ FV Prox  Full                                                        +---------+---------------+---------+-----------+----------+--------------+ FV Mid   Full                                                        +---------+---------------+---------+-----------+----------+--------------+ FV DistalFull                                                        +---------+---------------+---------+-----------+----------+--------------+ PFV      Full                                                        +---------+---------------+---------+-----------+----------+--------------+  POP      Full           Yes      Yes                                 +---------+---------------+---------+-----------+----------+--------------+ PTV      Full                                                        +---------+---------------+---------+-----------+----------+--------------+ PERO     Full                                                        +---------+---------------+---------+-----------+----------+--------------+   +---------+---------------+---------+-----------+----------+--------------+ LEFT     CompressibilityPhasicitySpontaneityPropertiesThrombus Aging +---------+---------------+---------+-----------+----------+--------------+ CFV      Full           Yes      Yes                                 +---------+---------------+---------+-----------+----------+--------------+ SFJ      Full                                                        +---------+---------------+---------+-----------+----------+--------------+ FV Prox  Full                                                        +---------+---------------+---------+-----------+----------+--------------+ FV Mid   Full                                                         +---------+---------------+---------+-----------+----------+--------------+ FV DistalFull                                                        +---------+---------------+---------+-----------+----------+--------------+ PFV      Full                                                        +---------+---------------+---------+-----------+----------+--------------+ POP      Full           Yes      Yes                                 +---------+---------------+---------+-----------+----------+--------------+  PTV      Full                                                        +---------+---------------+---------+-----------+----------+--------------+ PERO     Full                                                        +---------+---------------+---------+-----------+----------+--------------+     Summary: RIGHT: - There is no evidence of deep vein thrombosis in the lower extremity.  - No cystic structure found in the popliteal fossa.  LEFT: - There is no evidence of deep vein thrombosis in the lower extremity.  - No cystic structure found in the popliteal fossa.  *See table(s) above for measurements and observations.    Preliminary     Review of Systems  Constitutional: Positive for fatigue and fever. Negative for chills and diaphoresis.  HENT: Negative for ear discharge, ear pain, hearing loss and tinnitus.   Eyes: Negative for photophobia and pain.  Respiratory: Negative for cough and shortness of breath.   Cardiovascular: Negative for chest pain.  Gastrointestinal: Negative for abdominal pain, nausea and vomiting.  Genitourinary: Negative for dysuria, flank pain, frequency and urgency.  Musculoskeletal: Positive for arthralgias (Right shoulder, left ankle). Negative for back pain, myalgias and neck pain.  Neurological: Negative for dizziness and headaches.  Hematological: Does not bruise/bleed easily.  Psychiatric/Behavioral: The patient is  not nervous/anxious.    Blood pressure (!) 111/57, pulse (!) 132, temperature 99.5 F (37.5 C), temperature source Oral, resp. rate (!) 39, height 5\' 7"  (1.702 m), weight 77 kg, last menstrual period 11/10/2020, SpO2 95 %. Physical Exam Constitutional:      General: She is not in acute distress.    Appearance: She is well-developed and well-nourished. She is not diaphoretic.  HENT:     Head: Normocephalic and atraumatic.  Eyes:     General: No scleral icterus.       Right eye: No discharge.        Left eye: No discharge.     Conjunctiva/sclera: Conjunctivae normal.  Cardiovascular:     Rate and Rhythm: Normal rate and regular rhythm.  Pulmonary:     Effort: Pulmonary effort is normal. No respiratory distress.  Musculoskeletal:     Cervical back: Normal range of motion.     Comments: Right shoulder, elbow, wrist, digits- no skin wounds, severe diffuse TTP, no instability, no blocks to motion except pain, AROM/PROM limited to <5 degrees motion in all planes  Sens  Ax/R/M/U intact  Mot   Ax/ R/ PIN/ M/ AIN/ U grossly intact  Rad 2+  LLE No traumatic wounds or ecchymosis, erythema over dorsal lower leg/ankle  Mild TTP, no significant pain with gentle PROM ankle flex/ext and inv/ev  No knee or obvious ankle effusion  Knee stable to varus/ valgus and anterior/posterior stress  Sens DPN, SPN, TN intact  Motor EHL, ext, flex, evers 5/5  DP 2+, PT 1+, No significant edema  Skin:    General: Skin is warm and dry.  Neurological:     Mental Status: She is alert.  Psychiatric:  Mood and Affect: Mood and affect normal.        Behavior: Behavior normal.     Assessment/Plan: Right shoulder pain -- Will aspirate and send for analysis. Left ankle pain -- Less concerned about septic joint, likely just cellulitis.  Will make NPO while we await GS.    Freeman Caldron, PA-C Orthopedic Surgery [phone] 11/17/2020, 4:00 PM

## 2020-11-17 NOTE — Consult Note (Signed)
NAME:  Amma Crear, MRN:  [id number], DOB:  03/24/1986, LOS: 1 ADMISSION DATE:  11/15/2020, CONSULTATION DATE:  11/17/2020 REFERRING MD:  Dr. Sunnie Nielsen, CHIEF COMPLAINT:  Sepsis    Brief History:  34yo female presented with cough and myalgias, found to be bacteremic with known history of IVDU  History of Present Illness:  David Towson is a 35yo female with PMH significant for IVDU, tobacco abuse, and malnutrition who presented to the ED with complaints of cough and [name].  She reported "everything hurts".  Patient reports the symptoms have been present for approximately 1 week prior to admission.  Also reports fever upwards of 101.5, nonproductive cough and generalized weakness with ambulatory dysfunction.  Patient reports long running history of IV drug abuse with recent relapse 3 days prior to admission.  She does report prior history of endocarditis with hospitalization approximately 4 years ago where she left AGAINST MEDICAL ADVICE.  On arrival patient was seen afebrile, tachycardic, and tachypneic.  Significant lab work included: Sodium 124, potassium 2.6, glucose 128, BUN 40, creatinine 1.33, albumin 2.1 lactic acid 3.6, WBC 15.3, platelets 51.  Toxicology positive for cocaine urinalysis positive for trace leukocytes and nitrates.  Admission chest x-ray significant for bilateral lung opacities likely consistent with residual scarring from pulmonary emboli.  CT abdomen and pelvis consistent with multiple subtle hypoenhancing lesions of the renal cortical consistent with septic emboli and splenomegaly.  CTA chest negative for PE but revealed numerous bilateral cavitary nodules throughout the lungs most consistent with septic emboli.  Patient was initially admitted to hospitalist team for further management and treatment of bacteremia.  However morning of 2/14 patient began developing worsening signs of severe sepsis including fever with T-max of 100.2, tachycardia with a heart rate of 140,  tachypnea with respiratory rates in the upper 40s to 50s.  Blood cultures positive for Staph aureus PCCM was consulted for further management..  Past Medical History:  IV drug abuse Tobacco abuse Malnutrition Endocarditis  Significant Hospital Events:  Admitted 2/13  Consults:  ID  Procedures:  .  Significant Diagnostic Tests:  Chest x-ray 2/12 >s ignificant for bilateral lung opacities likely consistent with residual scarring from pulmonary emboli.    CT abdomen and pelvis 2/12>  consistent with multiple subtle hypoenhancing lesions of the renal cortical consistent with septic emboli and splenomegaly.   CTA chest 2/12 > negative for PE but revealed numerous bilateral cavitary nodules throughout the lungs most consistent with septic emboli  Micro Data:  Covid 2/12 > negative Blood cultures > positive for Staph aureus Urine culture 2/12 >  Antimicrobials:  Cefepime 2/12 > Flagyl 2/13 > Vancomycin 2/12 >  Interim History / Subjective:  Lying in bed with significant complaints of pain all over Requesting something to drink  Objective   Blood pressure 137/67, pulse (!) 141, temperature (!) 101.2 F (38.4 C), temperature source Oral, resp. rate (!) 56, height 5\' 7"  (1.702 m), weight 78 kg, last menstrual period 11/10/2020, SpO2 96 %.        Intake/Output Summary (Last 24 hours) at 11/17/2020 0947 Last data filed at 11/17/2020 0500 Gross per 24 hour  Intake 2433.69 ml  Output 1600 ml  Net 833.69 ml   Filed Weights   11/15/20 1227 11/16/20 1147 11/17/20 0515  Weight: 68 kg 71.5 kg 78 kg    Examination: General: Acute on chronic ill-appearing adult female lying in bed in moderate discomfort HEENT: [place]/AT, MM pink/moist, PERRL,  Neuro: Alert and interactive but unable to answer  several orientation questions, repeat pain and request for something to drink over and over CV: s1s2 regular rate and rhythm, no murmur, rubs, or gallops,  PULM: Bilateral rhonchi, significant  tachypnea, weak ineffective cough, pulmonary splinting GI: soft, bowel sounds active in all 4 quadrants, non-tender, non-distended, Extremities: warm/dry, nonpitting lower extremity edema left greater than right edema  Skin: Multiple areas of redness near the joint areas including right shoulder and left ankle  Resolved Hospital Problem list     Assessment & Plan:  Severe sepsis with MRSA bacteremia in the setting of IV drug abuse -Seen development of fever, persistent tachycardia, tachypnea, leukocytosis, and leukocytosis meeting criteria for severe sepsis -Blood cultures positive for Staph aureus P: Transfer to ICU Supplemental oxygen as needed for sats goal greater than 90 Continue broad-spectrum IV antibiotics ID to follow given MRSA bacteremia IV hydration  MAP goal greater than 65  Trend lactic acid Monitor urine output  IV drug abuse with history of endocarditis -Patient reports 2 years of sobriety but recent relapse 3 days prior to admission -Patient reports history of endocarditis with prolonged hospitalization approximately 4 years ago, she left AGAINST MEDICAL ADVICE Tobacco abuse P Monitor for withdrawal symptoms May be a good candidate for Precedex once transferred to ICU Monitor respiratory drive closely if benzodiazepines utilize, currently significantly tachypneic Will involve transition of care team more stable 2D echocardiogram pending  Concern for underlying septic arthritis -Patient has multiple areas of erythema and warmth near the joints including left ankle and right shoulder P: Ortho eval Treatment for bacteremia as above  Thrombocytopenia -Possibly reactive to bacteremia P: Obtain DIC panel Trend CBC Monitor for signs of bleeding  Hypokalemia P: Trend Bmet Supplement as needed Continuous telemetry  Malnutrition -Body mass index 26.93 P: Consult dietitian May need core track if prolonged oral intake is expected   Best practice  (evaluated daily)  Diet: N.p.o. advance as respiratory status allows Pain/Anxiety/Delirium protocol (if indicated): Consider Precedex drip VAP protocol (if indicated): In place DVT prophylaxis: SCDs pharmacological agents on hold due to thrombocytopenia GI prophylaxis: PPI Glucose control: Monitor Mobility: Bedrest Disposition: ICU  Goals of Care:  Last date of multidisciplinary goals of care discussion: Pending Family and staff present: Pending Summary of discussion: Pending Follow up goals of care discussion due: Pending Code Status: Full  Labs   CBC: Recent Labs  Lab 11/15/20 1337 11/17/20 0234  WBC 15.3* 8.2  HGB 13.0 10.1*  HCT 36.4 27.9*  MCV 77.1* 77.5*  PLT 51* 40*    Basic Metabolic Panel: Recent Labs  Lab 11/15/20 1735 11/16/20 1037 11/16/20 1930 11/17/20 0234  NA 124* 130* 128* 129*  K 2.6* 2.5* 4.2 3.9  CL 90* 103 101 102  CO2 19* 16* 18* 19*  GLUCOSE 128* 119* 131* 104*  BUN 40* 25* 22* 22*  CREATININE 1.33* 0.84 0.92 0.84  CALCIUM 7.7* 7.0* 7.4* 7.4*  MG 1.8 1.9  --   --    GFR: Estimated Creatinine Clearance: 100.6 mL/min (by C-G formula based on SCr of 0.84 mg/dL). Recent Labs  Lab 11/15/20 1337 11/15/20 1845 11/15/20 2135 11/16/20 1531 11/16/20 1930 11/16/20 2234 11/17/20 0234  PROCALCITON  --   --   --   --  1.97  --   --   WBC 15.3*  --   --   --   --   --  8.2  LATICACIDVEN  --    < > 2.7* 3.2* 3.6* 3.1*  --    < > =  values in this interval not displayed.    Liver Function Tests: Recent Labs  Lab 11/15/20 1735 11/16/20 1037  AST 23 27  ALT 12 11  ALKPHOS 74 71  BILITOT 0.9 1.7*  PROT 6.5 5.7*  ALBUMIN 2.1* 1.7*   Recent Labs  Lab 11/15/20 1735  LIPASE 20   No results for input(s): AMMONIA in the last 168 hours.  ABG No results found for: PHART, PCO2ART, PO2ART, HCO3, TCO2, ACIDBASEDEF, O2SAT   Coagulation Profile: No results for input(s): INR, PROTIME in the last 168 hours.  Cardiac Enzymes: Recent Labs   Lab 11/15/20 1735  CKTOTAL 43    HbA1C: No results found for: HGBA1C  CBG: Recent Labs  Lab 11/15/20 1535  GLUCAP 145*    Review of Systems:   Unable to assess given  Past Medical History:  She,  has a past medical history of IV drug user, Malnutrition (HCC) (11/16/2020), Poor dentition (11/16/2020), and Tobacco dependence (11/16/2020).   Surgical History:  History reviewed. No pertinent surgical history.   Social History:   reports that she has been smoking cigarettes. She has a 18.00 pack-year smoking history. She has never used smokeless tobacco. She reports current drug use. Drugs: Cocaine and Marijuana. She reports that she does not drink alcohol.   Family History:  Her family history is not on file.   Allergies No Known Allergies   Home Medications  Prior to Admission medications   Not on File     Critical care time: Acute illness   Performed by: Delfin Gant  Total critical care time: 45 minutes  Critical care time was exclusive of separately billable procedures and treating other patients.  Critical care was necessary to treat or prevent imminent or life-threatening deterioration.  Critical care was time spent personally by me on the following activities: development of treatment plan with patient and/or surrogate as well as nursing, discussions with consultants, evaluation of patient's response to treatment, examination of patient, obtaining history from patient or surrogate, ordering and performing treatments and interventions, ordering and review of laboratory studies, ordering and review of radiographic studies, pulse oximetry and re-evaluation of patient's condition.  Delfin Gant, NP-C [facility] Personal contact information can be found on Amion  If no response please page: Adult pulmonary and critical care medicine pager on Amion unitl 7pm After 7pm please call [phone] 11/17/2020, 10:32 AM

## 2020-11-17 NOTE — Progress Notes (Signed)
OT Cancellation Note  Patient Details Name: Anne Bautista MRN: [id number] DOB: 07/31/1986   Cancelled Treatment:    Reason Eval/Treat Not Completed: Medical issues which prohibited therapy (Pt with high RR of 54, uncontrolled pain and SOB with any exertion as well as excrutiating reported pain. Pt is too unstable for OT eval and per RN may be transferred to ICU. OT to continue to follow as appropriate for OT Eval.)   Flora Lipps, OTR/L Acute Rehabilitation Services Pager: [phone] Office: [phone]   Aarion Metzgar C 11/17/2020, 9:53 AM

## 2020-11-17 NOTE — Progress Notes (Signed)
Rcvd pt from 3E with Anne Coaster RN transporting. Pt's boyfriend, [name], called for update. Pt gave permission to give updates to [name], witnessed by [employer] and Astronomer.

## 2020-11-17 NOTE — Progress Notes (Signed)
Report called to Alycia Rossetti, RN in ICU. Pt having echo completed at this time. Staff to transfer pt to ICU once completed

## 2020-11-17 NOTE — Plan of Care (Addendum)
  Problem: Fluid Volume: Goal: Hemodynamic stability will improve Outcome: Progressing   Problem: Respiratory: Goal: Ability to maintain adequate ventilation will improve Outcome: Progressing   Problem: Health Behavior/Discharge Planning: Goal: Ability to manage health-related needs will improve Outcome: Progressing   Problem: Clinical Measurements: Goal: Diagnostic test results will improve Outcome: Progressing   Problem: Nutrition: Goal: Adequate nutrition will be maintained Outcome: Progressing   Problem: Coping: Goal: Level of anxiety will decrease Outcome: Progressing, pt anxiety tolerable w/ med regimen, but pt presents difficulties in management r/t substance abuse hx and increased tolerance of narcotics. Responds well to therapeutic communication.

## 2020-11-17 NOTE — Progress Notes (Signed)
Contacted MD due to elevated temp, RR 56 times per min. Pt has Red MEWS again. MD in route. Orders to stop IV fluid bolus and maintenace fluids due to overload. Pt very agitated.  COWS score 24. MD will reorder PRN suboxone. Critical care management called to assess pt for transfer. Tylenol given. Metoprolol 2.5mg  x2 given for HR. Mild improvement. Awaiting for CCM assessment for transfer to ICU. Pt stable at this time. Awaiting for pharmacy to verify suboxone in order to give to pt.

## 2020-11-17 NOTE — Progress Notes (Signed)
Lower extremity venous bilateral study completed.  Preliminary results relayed to RN.   See CV Proc for preliminary results report.   Shanekqua Schaper, RDMS  

## 2020-11-17 NOTE — Progress Notes (Signed)
Pt is resting at this time. Suboxone not given at this time.

## 2020-11-18 DIAGNOSIS — N3000 Acute cystitis without hematuria: Secondary | ICD-10-CM

## 2020-11-18 LAB — C-REACTIVE PROTEIN: CRP: 18.6 mg/dL — ABNORMAL HIGH (ref <1.0)

## 2020-11-18 LAB — CULTURE, BLOOD (ROUTINE X 2): Special Requests: ADEQUATE

## 2020-11-18 LAB — BASIC METABOLIC PANEL
Anion gap: 7 (ref 5–15)
BUN: 31 mg/dL — ABNORMAL HIGH (ref 6–20)
CO2: 20 mmol/L — ABNORMAL LOW (ref 22–32)
Calcium: 7.3 mg/dL — ABNORMAL LOW (ref 8.9–10.3)
Chloride: 103 mmol/L (ref 98–111)
Creatinine, Ser: 1.07 mg/dL — ABNORMAL HIGH (ref 0.44–1.00)
GFR, Estimated: >60 mL/min (ref >60)
Glucose, Bld: 99 mg/dL (ref 70–99)
Potassium: 3.6 mmol/L (ref 3.5–5.1)
Sodium: 130 mmol/L — ABNORMAL LOW (ref 135–145)

## 2020-11-18 LAB — URINE CULTURE
Culture: >=100000 — AB
Special Requests: NORMAL

## 2020-11-18 LAB — SEDIMENTATION RATE: Sed Rate: 112 mm/hr — ABNORMAL HIGH (ref 0–22)

## 2020-11-18 MED ORDER — OXYCODONE HCL 5 MG PO TABS
10.0000 mg | ORAL_TABLET | Freq: Four times a day (QID) | ORAL | Status: DC
  Administered 2020-11-18 – 2020-11-21 (×12): 10 mg via ORAL
  Filled 2020-11-18 (×12): qty 2

## 2020-11-18 MED ORDER — POTASSIUM CHLORIDE 10 MEQ/100ML IV SOLN
10.0000 meq | INTRAVENOUS | Status: AC
  Administered 2020-11-18 (×4): 10 meq via INTRAVENOUS
  Filled 2020-11-18 (×4): qty 100

## 2020-11-18 MED ORDER — POTASSIUM CHLORIDE CRYS ER 20 MEQ PO TBCR: 40.0000 meq | EXTENDED_RELEASE_TABLET | Freq: Once | ORAL | Status: DC

## 2020-11-18 MED ORDER — HYDROMORPHONE HCL 1 MG/ML IJ SOLN
1.0000 mg | INTRAMUSCULAR | Status: DC | PRN
  Administered 2020-11-18: 2 mg via INTRAVENOUS
  Administered 2020-11-18: 1 mg via INTRAVENOUS
  Administered 2020-11-19: 2 mg via INTRAVENOUS
  Administered 2020-11-19 (×2): 1 mg via INTRAVENOUS
  Administered 2020-11-19 – 2020-11-20 (×3): 2 mg via INTRAVENOUS
  Filled 2020-11-18: qty 1
  Filled 2020-11-18: qty 2
  Filled 2020-11-18: qty 1
  Filled 2020-11-18 (×2): qty 2
  Filled 2020-11-18: qty 1
  Filled 2020-11-18 (×2): qty 2

## 2020-11-18 MED ORDER — LORAZEPAM 2 MG/ML IJ SOLN
0.5000 mg | Freq: Four times a day (QID) | INTRAMUSCULAR | Status: DC | PRN
  Administered 2020-11-18 – 2020-11-22 (×10): 1 mg via INTRAVENOUS
  Administered 2020-11-23: 0.5 mg via INTRAVENOUS
  Administered 2020-11-25 – 2020-12-07 (×14): 1 mg via INTRAVENOUS
  Filled 2020-11-18 (×27): qty 1

## 2020-11-18 MED ORDER — DOCUSATE SODIUM 100 MG PO CAPS
100.0000 mg | ORAL_CAPSULE | Freq: Two times a day (BID) | ORAL | Status: DC
  Administered 2020-11-18 – 2020-12-28 (×51): 100 mg via ORAL
  Filled 2020-11-18 (×73): qty 1

## 2020-11-18 MED ORDER — POLYETHYLENE GLYCOL 3350 17 G PO PACK
17.0000 g | PACK | Freq: Two times a day (BID) | ORAL | Status: DC
  Administered 2020-11-19 – 2020-12-22 (×31): 17 g via ORAL
  Filled 2020-11-18 (×67): qty 1

## 2020-11-18 NOTE — Evaluation (Signed)
Physical Therapy Evaluation Patient Details Name: Anne Bautista MRN: [id number] DOB: [birth year] Today's Date: 11/18/2020   History of Present Illness  35 yo admitted 2/14 with sepsis due to MRSA bacteremia in setting of IVDU. Rt shoulder pain s/p aspiration 2/14. PMHx: IVDU, malnutrition, poor dentition  Clinical Impression  Pt with flat affect, with fear to mobility due to pain with Dilaudid given prior to session, pt also perseverating on wanting water despite education for current NPO status. Pt with slow processing and initiation but able to transition from bed to chair. Pt with decreased strength, function, transfers and cognition who will benefit from acute therapy to maximize mobility, safety and independence to decrease burden of care. Pt reports she lives with boyfriend but that he works.  93% on RA HR 93-102    Follow Up Recommendations SNF;Supervision for mobility/OOB    Equipment Recommendations  Other (comment) (TBD with progression)    Recommendations for Other Services OT consult     Precautions / Restrictions Precautions Precautions: Fall Precaution Comments: right shoulder, left ankle, back pain      Mobility  Bed Mobility Overal bed mobility: Needs Assistance Bed Mobility: Supine to Sit     Supine to sit: Min assist;HOB elevated     General bed mobility comments: HOB 40 degrees with tactile cues to initiate movement of legs to EOB and increased time with min assist to elevate trunk from surface, min to pivot fully to EOB    Transfers Overall transfer level: Needs assistance   Transfers: Sit to/from Stand;Stand Pivot Transfers Sit to Stand: Min assist Stand pivot transfers: Mod assist       General transfer comment: pt with cues and use of belt and axilla support to pivot toward right with pt only shifting weight on Rt foot in standing due to pain but unable to significantly off weight with UE use. Min to stand from bed and [facility] with pivot  bed>BSC>recliner  Ambulation/Gait             General Gait Details: unable  Stairs            Wheelchair Mobility    Modified Rankin (Stroke Patients Only)       Balance Overall balance assessment: Needs assistance Sitting-balance support: No upper extremity supported;Feet supported Sitting balance-Leahy Scale: Fair Sitting balance - Comments: EOb without UE support with flexed trunk   Standing balance support: Single extremity supported Standing balance-Leahy Scale: Poor Standing balance comment: reliant on trunk and UE support                             Pertinent Vitals/Pain Pain Assessment: 0-10 Pain Score: 7  Pain Location: left ankle and back Pain Descriptors / Indicators: Aching;Guarding;Moaning Pain Intervention(s): Limited activity within patient's tolerance;Monitored during session;Premedicated before session;Repositioned    Home Living Family/patient expects to be discharged to:: Private residence Living Arrangements: Spouse/significant other Available Help at Discharge: Friend(s);Available PRN/intermittently Type of Home: Apartment Home Access: Level entry     Home Layout: One level Home Equipment: None      Prior Function Level of Independence: Independent               Hand Dominance        Extremity/Trunk Assessment   Upper Extremity Assessment Upper Extremity Assessment: RUE deficits/detail;LUE deficits/detail RUE Deficits / Details: pt with very limited movement of bil UE and unable to fully assess due to lack of participation and pain  LUE Deficits / Details: pt with very limited movement of bil UE and unable to fully assess due to lack of participation and pain    Lower Extremity Assessment Lower Extremity Assessment: RLE deficits/detail;LLE deficits/detail RLE Deficits / Details: grossly 2+/5 able to lift leg in SLR and bear weight in standing, ROM appears WFL LLE Deficits / Details: edema and redness of  ankle with no active dorsiflexion due to pain, grossly 2/5 strength    Cervical / Trunk Assessment Cervical / Trunk Assessment: Kyphotic (rounded shoulders, forward head, pain to thoracic palpation)  Communication   Communication: No difficulties  Cognition Arousal/Alertness: Awake/alert Behavior During Therapy: Flat affect Overall Cognitive Status: Impaired/Different from baseline Area of Impairment: Following commands;Safety/judgement                       Following Commands: Follows one step commands inconsistently;Follows one step commands with increased time Safety/Judgement: Decreased awareness of deficits;Decreased awareness of safety     General Comments: pt with slow processing, mumbled speech at times and needs multimodal cues to initiate and perform transfers. Limited by fear of pain and pain      General Comments      Exercises     Assessment/Plan    PT Assessment Patient needs continued PT services  PT Problem List Decreased strength;Decreased mobility;Decreased safety awareness;Decreased range of motion;Decreased coordination;Decreased activity tolerance;Decreased cognition;Decreased balance;Decreased knowledge of use of DME;Pain       PT Treatment Interventions DME instruction;Therapeutic exercise;Gait training;Balance training;Functional mobility training;Cognitive remediation;Therapeutic activities;Patient/family education    PT Goals (Current goals can be found in the Care Plan section)  Acute Rehab PT Goals Patient Stated Goal: return home PT Goal Formulation: With patient Time For Goal Achievement: 12/02/20 Potential to Achieve Goals: Fair    Frequency Min 3X/week   Barriers to discharge Decreased caregiver support      Co-evaluation               AM-PAC PT "6 Clicks" Mobility  Outcome Measure Help needed turning from your back to your side while in a flat bed without using bedrails?: A Little Help needed moving from lying on  your back to sitting on the side of a flat bed without using bedrails?: A Little Help needed moving to and from a bed to a chair (including a wheelchair)?: A Lot Help needed standing up from a chair using your arms (e.g., wheelchair or bedside chair)?: A Little Help needed to walk in hospital room?: Total Help needed climbing 3-5 steps with a railing? : Total 6 Click Score: 13    End of Session Equipment Utilized During Treatment: Gait belt Activity Tolerance: Patient limited by pain Patient left: in chair;with call bell/phone within reach;with chair alarm set Nurse Communication: Mobility status;Precautions PT Visit Diagnosis: Other abnormalities of gait and mobility (R26.89);Difficulty in walking, not elsewhere classified (R26.2);Pain;Muscle weakness (generalized) (M62.81) Pain - Right/Left: Left Pain - part of body: Ankle and joints of foot    Time: 6811-5726 PT Time Calculation (min) (ACUTE ONLY): 35 min   Charges:   PT Evaluation $PT Eval Moderate Complexity: 1 Mod          Wyatt Galvan P, PT Acute Rehabilitation Services Pager: [phone] Office: [phone]   Enedina Finner Chaquita Basques 11/18/2020, 9:24 AM

## 2020-11-18 NOTE — Progress Notes (Addendum)
I have seen and examined the patient. I have personally reviewed the clinical findings, laboratory findings, microbiological data and imaging studies. The assessment and treatment plan was discussed with the  Advance Practice Provider, Jeanine Luz  I agree with her/his recommendations except following additions/corrections.  Noted that patient had h/o endocarditis 4 years ago when she left AMA per charty review.  Fever curve trending down. No leukocytosis. She is off oxygen. She was trying to have a BM this morning.  Exam- RT shoulder is less warm and tender today than yesterday. Still has left ankle pain and back pain. Back pain is more generalized and diffuse rather than specific point  Cr went to 1.07, Platelets down to 53 ( setting of splenomegaly from TV endocarditis and sepsis) 2/14 blood cultures 2/2 sets NG in <12 hrs  Rt shoulder joint aspiration by Ortho which is cloudy with WBC around 8,250 ( N -88). Cultures pending. Gram stain no organisms. No concerns of septic left ankle joint by Ortho and possible left ankle cellulitis TTE with 2 x 1 cm mobile vegetation attached to the tricuspid valve   TEE and CT sx evaluation given the size of TV vegetation for possible need for surgical intervention  Consider CT chest given concerns for septic emboli in chest Xray  Follow up Ortho recs for possible need of wash out of rt shoulder  Follow up repeat blood cultures and synovial fluid cultures  Monitor CBC, BMP ( Cr up to 1.07) and vancomycin trough on IV antibiotics   Odette Fraction, MD [facility]    [facility]  Date of Admission:  11/15/2020     Total days of antibiotics 4         ASSESSMENT:  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 Blood cultures from 2/14 have been without growth in <24  hours. Right shoulder with potential for septic arthritis with aspiration cultures pending although WBC count was only 8K and neutrophils 88%. Will continue current dose of vancomycin indefinitely. Continue to monitor renal function and vancomycin levels. Pain management per primary team.   PLAN:  1. Continue vancomycin.  2. Monitor blood and aspiration cultures.  3. Recommend TEE and CVTS evaluation for possible angiovac.  4. Shoulder interventions per orthopedics as indicated 5. Pain management/substance use per primary team.   Principal Problem:   Severe sepsis due to methicillin resistant Staphylococcus aureus (MRSA) with acute organ dysfunction (HCC) Active Problems:   IV drug abuse (HCC)   Poor dentition   Malnutrition (HCC)   Thrombocytopenia (HCC)   Tobacco dependence   . chlorhexidine  15 mL Mouth Rinse BID  . Chlorhexidine Gluconate Cloth  6 each Topical Daily  . Chlorhexidine Gluconate Cloth  6 each Topical Q0600  . cloNIDine  0.1 mg Oral BH-qamhs   Followed by  . [START ON 11/21/2020] cloNIDine  0.1 mg Oral QAC breakfast  . feeding supplement  237 mL Oral TID BM  . folic acid  1 mg Oral Daily  . mouth rinse  15 mL Mouth Rinse q12n4p  . multivitamin with minerals  1 tablet Oral Daily  . mupirocin ointment  1 application Nasal BID  . sodium chloride flush  3 mL Intravenous Q12H  . thiamine  100 mg Oral Daily    SUBJECTIVE:  Moved to the ICU secondary to tachycardia  and tachypnea. Afebrile for the past 24 hours and downtrending WBC count. Echo with mobile density on tricuspid valve.  No Known Allergies   Review of Systems: Review of Systems  Constitutional: Negative for chills, fever and weight loss.  Respiratory: Negative for cough, shortness of breath and wheezing.   Cardiovascular: Positive for chest pain. Negative for leg swelling.  Gastrointestinal: Negative for abdominal pain, constipation, diarrhea, nausea and vomiting.  Musculoskeletal: Positive for  back pain.       Generalized pain  Skin: Negative for rash.      OBJECTIVE: Vitals:   11/18/20 0731 11/18/20 0800 11/18/20 0914 11/18/20 0919  BP:  (!) 106/55  104/60  Pulse:  96 (!) 102   Resp:  (!) 32    Temp: 98 F (36.7 C)     TempSrc: Axillary     SpO2:  100% 93%   Weight:      Height:       Body mass index is 26.59 kg/m.  Physical Exam Constitutional:      General: She is not in acute distress.    Appearance: She is well-developed and well-nourished.  Cardiovascular:     Rate and Rhythm: Normal rate and regular rhythm.     Pulses: Intact distal pulses.     Heart sounds: Normal heart sounds.  Pulmonary:     Effort: Pulmonary effort is normal.     Breath sounds: Normal breath sounds.  Skin:    General: Skin is warm and dry.  Neurological:     Mental Status: She is alert and oriented to person, place, and time.  Psychiatric:        Mood and Affect: Mood and affect normal.     Lab Results Lab Results  Component Value Date   WBC 8.2 11/17/2020   HGB 10.1 (L) 11/17/2020   HCT 27.9 (L) 11/17/2020   MCV 77.5 (L) 11/17/2020   PLT 53 (L) 11/17/2020    Lab Results  Component Value Date   CREATININE 1.07 (H) 11/18/2020   BUN 31 (H) 11/18/2020   NA 130 (L) 11/18/2020   K 3.6 11/18/2020   CL 103 11/18/2020   CO2 20 (L) 11/18/2020    Lab Results  Component Value Date   ALT 11 11/17/2020   AST 19 11/17/2020   ALKPHOS 73 11/17/2020   BILITOT 1.5 (H) 11/17/2020     Microbiology: Recent Results (from the past 240 hour(s))  Resp Panel by RT-PCR (Flu A&B, Covid) Peripheral     Status: None   Collection Time: 11/15/20  4:10 PM   Specimen: Peripheral; Nasopharyngeal(NP) swabs in vial transport medium  Result Value Ref Range Status   SARS Coronavirus 2 by RT PCR NEGATIVE NEGATIVE Final    Comment: (NOTE) SARS-CoV-2 target nucleic acids are NOT DETECTED.  The SARS-CoV-2 RNA is generally detectable in upper respiratory specimens during the acute phase of  infection. The lowest concentration of SARS-CoV-2 viral copies this assay can detect is 138 copies/mL. A negative result does not preclude SARS-Cov-2 infection and should not be used as the sole basis for treatment or other patient management decisions. A negative result may occur with  improper specimen collection/handling, submission of specimen other than nasopharyngeal swab, presence of viral mutation(s) within the areas targeted by this assay, and inadequate number of viral copies(<138 copies/mL). A negative result must be combined with clinical observations, patient history, and epidemiological information. The expected result is Negative.  Fact Sheet for Patients:  BloggerCourse.com  Fact Sheet  for Healthcare Providers:  SeriousBroker.it  This test is no t yet approved or cleared by the [place] and  has been authorized for detection and/or diagnosis of SARS-CoV-2 by FDA under an Emergency Use Authorization (EUA). This EUA will remain  in effect (meaning this test can be used) for the duration of the COVID-19 declaration under Section 564(b)(1) of the Act, 21 U.S.C.section 360bbb-3(b)(1), unless the authorization is terminated  or revoked sooner.       Influenza A by PCR NEGATIVE NEGATIVE Final   Influenza B by PCR NEGATIVE NEGATIVE Final    Comment: (NOTE) The Xpert Xpress SARS-CoV-2/FLU/RSV plus assay is intended as an aid in the diagnosis of influenza from Nasopharyngeal swab specimens and should not be used as a sole basis for treatment. Nasal washings and aspirates are unacceptable for Xpert Xpress SARS-CoV-2/FLU/RSV testing.  Fact Sheet for Patients: BloggerCourse.com  Fact Sheet for Healthcare Providers: SeriousBroker.it  This test is not yet approved or cleared by the [place] FDA and has been authorized for detection and/or diagnosis of SARS-CoV-2  by FDA under an Emergency Use Authorization (EUA). This EUA will remain in effect (meaning this test can be used) for the duration of the COVID-19 declaration under Section 564(b)(1) of the Act, 21 U.S.C. section 360bbb-3(b)(1), unless the authorization is terminated or revoked.  Performed at [facility], [address] [place], [place] [address]   Blood culture (routine x 2)     Status: Abnormal   Collection Time: 11/15/20  4:10 PM   Specimen: BLOOD  Result Value Ref Range Status   Specimen Description   Final    BLOOD LEFT ANTECUBITAL Performed at [facility], [address] [place], [place] [address]    Special Requests   Final    BOTTLES DRAWN AEROBIC AND ANAEROBIC Blood Culture adequate volume Performed at [facility], [address] [place], [place] [address]    Culture  Setup Time   Final    GRAM POSITIVE COCCI IN BOTH AEROBIC AND ANAEROBIC BOTTLES CRITICAL RESULT CALLED TO, READ BACK BY AND VERIFIED WITH: CINDY REED,RN @0956  11/16/20 EB Performed at [facility], [address], [place], [place]    Culture METHICILLIN RESISTANT STAPHYLOCOCCUS AUREUS (A)  Final   Report Status 11/18/2020 FINAL  Final   Organism ID, Bacteria METHICILLIN RESISTANT STAPHYLOCOCCUS AUREUS  Final      Susceptibility   Methicillin resistant staphylococcus aureus - MIC*    CIPROFLOXACIN <=0.5 SENSITIVE Sensitive     ERYTHROMYCIN >=8 RESISTANT Resistant     GENTAMICIN <=0.5 SENSITIVE Sensitive     OXACILLIN >=4 RESISTANT Resistant     TETRACYCLINE <=1 SENSITIVE Sensitive     VANCOMYCIN 1 SENSITIVE Sensitive     TRIMETH/SULFA <=10 SENSITIVE Sensitive     CLINDAMYCIN <=0.25 SENSITIVE Sensitive     RIFAMPIN <=0.5 SENSITIVE Sensitive     Inducible Clindamycin NEGATIVE Sensitive     * METHICILLIN RESISTANT STAPHYLOCOCCUS AUREUS  Blood Culture ID Panel (Reflexed)     Status: Abnormal   Collection Time: 11/15/20  4:10 PM  Result Value Ref  Range Status   Enterococcus faecalis NOT DETECTED NOT DETECTED Final   Enterococcus Faecium NOT DETECTED NOT DETECTED Final   Listeria monocytogenes NOT DETECTED NOT DETECTED Final   Staphylococcus species DETECTED (A) NOT DETECTED Final    Comment: CRITICAL RESULT CALLED TO, READ BACK BY AND VERIFIED WITH: CINDY REED,RN @0956  11/16/20 EB  Staphylococcus aureus (BCID) DETECTED (A) NOT DETECTED Final    Comment: Methicillin (oxacillin)-resistant Staphylococcus aureus (MRSA). MRSA is predictably resistant to beta-lactam antibiotics (except ceftaroline). Preferred therapy is vancomycin unless clinically contraindicated. Patient requires contact precautions if  hospitalized. CRITICAL RESULT CALLED TO, READ BACK BY AND VERIFIED WITH: CINDY REED,RN  11/16/20 EB    Staphylococcus epidermidis NOT DETECTED NOT DETECTED Final   Staphylococcus lugdunensis NOT DETECTED NOT DETECTED Final   Streptococcus species NOT DETECTED NOT DETECTED Final   Streptococcus agalactiae NOT DETECTED NOT DETECTED Final   Streptococcus pneumoniae NOT DETECTED NOT DETECTED Final   Streptococcus pyogenes NOT DETECTED NOT DETECTED Final   A.calcoaceticus-baumannii NOT DETECTED NOT DETECTED Final   Bacteroides fragilis NOT DETECTED NOT DETECTED Final   Enterobacterales NOT DETECTED NOT DETECTED Final   Enterobacter cloacae complex NOT DETECTED NOT DETECTED Final   Escherichia coli NOT DETECTED NOT DETECTED Final   Klebsiella aerogenes NOT DETECTED NOT DETECTED Final   Klebsiella oxytoca NOT DETECTED NOT DETECTED Final   Klebsiella pneumoniae NOT DETECTED NOT DETECTED Final   Proteus species NOT DETECTED NOT DETECTED Final   Salmonella species NOT DETECTED NOT DETECTED Final   Serratia marcescens NOT DETECTED NOT DETECTED Final   Haemophilus influenzae NOT DETECTED NOT DETECTED Final   Neisseria meningitidis NOT DETECTED NOT DETECTED Final   Pseudomonas aeruginosa NOT DETECTED NOT DETECTED Final   Stenotrophomonas  maltophilia NOT DETECTED NOT DETECTED Final   Candida albicans NOT DETECTED NOT DETECTED Final   Candida auris NOT DETECTED NOT DETECTED Final   Candida glabrata NOT DETECTED NOT DETECTED Final   Candida krusei NOT DETECTED NOT DETECTED Final   Candida parapsilosis NOT DETECTED NOT DETECTED Final   Candida tropicalis NOT DETECTED NOT DETECTED Final   Cryptococcus neoformans/gattii NOT DETECTED NOT DETECTED Final   Meth resistant mecA/C and MREJ DETECTED (A) NOT DETECTED Final    Comment: CRITICAL RESULT CALLED TO, READ BACK BY AND VERIFIED WITH: CINDY REED,RN  11/16/20 EB Performed at [facility], [address], [place], [place] [address]   Blood culture (routine x 2)     Status: Abnormal   Collection Time: 11/15/20  4:25 PM   Specimen: BLOOD  Result Value Ref Range Status   Specimen Description   Final    BLOOD LEFT ANTECUBITAL Performed at [facility], [address] [place], [place] [address]    Special Requests   Final    BOTTLES DRAWN AEROBIC ONLY Blood Culture results may not be optimal due to an inadequate volume of blood received in culture bottles Performed at [facility], [address] [place], [place] [address]    Culture  Setup Time   Final    GRAM POSITIVE COCCI IN CLUSTERS AEROBIC BOTTLE ONLY CRITICAL VALUE NOTED.  VALUE IS CONSISTENT WITH PREVIOUSLY REPORTED AND CALLED VALUE.    Culture (A)  Final    STAPHYLOCOCCUS AUREUS SUSCEPTIBILITIES PERFORMED ON PREVIOUS CULTURE WITHIN THE LAST 5 DAYS. Performed at [facility], [address] [place], [place] [address]    Report Status 11/18/2020 FINAL  Final  Urine culture     Status: Abnormal   Collection Time: 11/15/20  7:47 PM   Specimen: Urine, Catheterized  Result Value Ref Range Status   Specimen Description   Final    URINE, CATHETERIZED Performed at [facility], [address] [place], [place] [address]    Special Requests   Final  Normal Performed at [facility] [place] [facility], [address] [place], [place] [address]    Culture (A)  Final    >=100,000 COLONIES/mL METHICILLIN RESISTANT STAPHYLOCOCCUS AUREUS   Report Status 11/18/2020 FINAL  Final   Organism ID, Bacteria METHICILLIN RESISTANT STAPHYLOCOCCUS AUREUS (A)  Final      Susceptibility   Methicillin resistant staphylococcus aureus - MIC*    CIPROFLOXACIN <=0.5 SENSITIVE Sensitive     GENTAMICIN <=0.5 SENSITIVE Sensitive     NITROFURANTOIN <=16 SENSITIVE Sensitive     OXACILLIN >=4 RESISTANT Resistant     TETRACYCLINE <=1 SENSITIVE Sensitive     VANCOMYCIN <=0.5 SENSITIVE Sensitive     TRIMETH/SULFA <=10 SENSITIVE Sensitive     CLINDAMYCIN <=0.25 SENSITIVE Sensitive     RIFAMPIN <=0.5 SENSITIVE Sensitive     Inducible Clindamycin NEGATIVE Sensitive     * >=100,000 COLONIES/mL METHICILLIN RESISTANT STAPHYLOCOCCUS AUREUS  Culture, blood (single)     Status: None (Preliminary result)   Collection Time: 11/15/20  9:35 PM   Specimen: BLOOD RIGHT HAND  Result Value Ref Range Status   Specimen Description   Final    BLOOD RIGHT HAND Performed at [facility], [address] [place], [place] [address]    Special Requests   Final    BOTTLES DRAWN AEROBIC AND ANAEROBIC Blood Culture adequate volume Performed at [facility], [address] [place], [place] [address]    Culture  Setup Time   Final    GRAM POSITIVE COCCI IN CLUSTERS ANAEROBIC BOTTLE ONLY CRITICAL VALUE NOTED.  VALUE IS CONSISTENT WITH PREVIOUSLY REPORTED AND CALLED VALUE. Performed at [facility], [address] [place], [place] [address]    Culture GRAM POSITIVE COCCI  Final   Report Status PENDING  Incomplete  Culture, blood (routine x 2)     Status: None (Preliminary result)   Collection Time: 11/17/20  9:28 AM   Specimen: BLOOD RIGHT FOREARM  Result Value Ref Range Status   Specimen Description BLOOD RIGHT FOREARM  Final   Special Requests   Final     BOTTLES DRAWN AEROBIC AND ANAEROBIC Blood Culture results may not be optimal due to an inadequate volume of blood received in culture bottles   Culture   Final    NO GROWTH < 12 HOURS Performed at [facility], [address] [place], [place] [address]    Report Status PENDING  Incomplete  Culture, blood (routine x 2)     Status: None (Preliminary result)   Collection Time: 11/17/20  9:39 AM   Specimen: BLOOD RIGHT HAND  Result Value Ref Range Status   Specimen Description BLOOD RIGHT HAND  Final   Special Requests   Final    BOTTLES DRAWN AEROBIC AND ANAEROBIC Blood Culture results may not be optimal due to an inadequate volume of blood received in culture bottles   Culture   Final    NO GROWTH < 12 HOURS Performed at [facility], [address] [place], [place] [address]    Report Status PENDING  Incomplete  MRSA PCR Screening     Status: Abnormal   Collection Time: 11/17/20 11:38 AM   Specimen: Nasopharyngeal  Result Value Ref Range Status   MRSA by PCR POSITIVE (A) NEGATIVE Final    Comment:        The GeneXpert MRSA Assay (FDA approved for NASAL specimens only), is one component of a comprehensive MRSA colonization surveillance  program. It is not intended to diagnose MRSA infection nor to guide or monitor treatment for MRSA infections. RESULT CALLED TO, READ BACK BY AND VERIFIED WITH: R ROBERTS RN 1400 11/17/20 A BROWNING Performed at [facility], [address], [place], [place] [address]   Body fluid culture w Gram Stain     Status: None (Preliminary result)   Collection Time: 11/17/20  4:05 PM   Specimen: Synovium; Body Fluid  Result Value Ref Range Status   Specimen Description SYNOVIAL RIGHT SHOULDER  Final   Special Requests NONE  Final   Gram Stain   Final    ABUNDANT WBC PRESENT, PREDOMINANTLY PMN NO ORGANISMS SEEN Performed at [facility], [address], [place], [place] [address]    Culture PENDING  Incomplete   Report  Status PENDING  Incomplete     Marcos Eke, NP [facility]  11/18/2020  9:55 AM

## 2020-11-18 NOTE — Progress Notes (Addendum)
Pharmacy Electrolyte Replacement  Recent Labs:  Recent Labs    11/16/20 1037 11/16/20 1930 11/18/20 0147  K 2.5*   < > 3.6  MG 1.9  --   --   CREATININE 0.84   < > 1.07*   < > = values in this interval not displayed.    Plan:  -K 3.6, replace with KCl IV  Thank you,  Margarite Gouge, PharmD PGY2 ID Pharmacy Resident Phone between 7 am - 3:30 pm: [phone]  Please check AMION for all [facility] phone numbers After 10:00 PM, call Main Pharmacy [phone]

## 2020-11-18 NOTE — Progress Notes (Signed)
NAME:  Anne Bautista, MRN:  [id number], DOB:  06-11-1986, LOS: 2 ADMISSION DATE:  11/15/2020, CONSULTATION DATE:  02.14.2022 REFERRING MD:  Sunnie Nielsen, CHIEF COMPLAINT:  Sepsis  Brief History:  35yo female presented with cough and myalgias, found to be bacteremic with known history of IVDU  History of Present Illness:  Anne Bautista is a 35yo female with PMH significant for IVDU, tobacco abuse, and malnutrition who presented to the ED with complaints of cough and [name].  She reported "everything hurts".  Patient reports the symptoms have been present for approximately 1 week prior to admission.  Also reports fever upwards of 101.5, nonproductive cough and generalized weakness with ambulatory dysfunction.  Patient reports long running history of IV drug abuse with recent relapse 3 days prior to admission.  She does report prior history of endocarditis with hospitalization approximately 4 years ago where she left AGAINST MEDICAL ADVICE.  On arrival patient was seen afebrile, tachycardic, and tachypneic.  Significant lab work included: Sodium 124, potassium 2.6, glucose 128, BUN 40, creatinine 1.33, albumin 2.1 lactic acid 3.6, WBC 15.3, platelets 51.  Toxicology positive for cocaine urinalysis positive for trace leukocytes and nitrates.  Admission chest x-ray significant for bilateral lung opacities likely consistent with residual scarring from pulmonary emboli.  CT abdomen and pelvis consistent with multiple subtle hypoenhancing lesions of the renal cortical consistent with septic emboli and splenomegaly.  CTA chest negative for PE but revealed numerous bilateral cavitary nodules throughout the lungs most consistent with septic emboli.  Patient was initially admitted to hospitalist team for further management and treatment of bacteremia.  However morning of 2/14 patient began developing worsening signs of severe sepsis including fever with T-max of 100.2, tachycardia with a heart rate of 140, tachypnea  with respiratory rates in the upper 40s to 50s.  Blood cultures positive for Staph aureus. PCCM was consulted for further management and patient transferred to the ICU on 02.14.2022.  Past Medical History:  IV drug abuse Tobacco abuse Malnutrition Endocarditis  Significant Hospital Events:  2/13 Admitted  Consults:  Infectious Disease  Procedures:  2/14 Right shoulder aspiration   Significant Diagnostic Tests:  Covid 2/12 > negative Blood cultures > positive for staph aureus Urine culture 2/12 > positive staph aureus  CT abd/ pelvis 2/12> mult. Hypo-enhancing lesions renal cortices, suspect septic emboli; splenomegaly CT angio chest 2/12> no PE; bilateral, numerous cavitary nodules, likely septic emboli vs vasculitis vs metastatic disease; pleural effusions, trace right, small left CT head w/o contrast 2/12> brain appears normal; right maxillary sinus, possible rhinosinusitis  ECHO 2/14> positive for vegetation on tricuspid valve LE Dopplers 2/14> negative for DVT bilaterally R Shoulder Joint Aspiration>  No organisms seen  Micro Data:  Covid 2/12 > negative Blood cultures > positive for Staph aureus Urine culture 2/12 > positive Staph aureus R Shoulder aspiration> negative   Antimicrobials:  Cefepime 2/12 >2/13 Flagyl 2/13 >2/13 Vancomycin 2/12 >>  Interim History / Subjective:  Patient reports getting some sleep overnight; pain is a little better controlled. Right shoulder, left ankle and back are issues. Receiving opioid pain medication, anxiolytics. No BM recently (this admission) and does not remember last BM.  Objective   Blood pressure (!) 95/54, pulse (!) 132, temperature 98 F (36.7 C), temperature source Axillary, resp. rate (!) 34, height 5\' 7"  (1.702 m), weight 77 kg, last menstrual period 11/10/2020, SpO2 100 %.        Intake/Output Summary (Last 24 hours) at 11/18/2020 0731 Last data filed at 11/18/2020  8546 Gross per 24 hour  Intake 3990.49 ml   Output 1200 ml  Net 2790.49 ml   Filed Weights   11/16/20 1147 11/17/20 0515 11/17/20 1100  Weight: 71.5 kg 78 kg 77 kg    Examination: General: Uncomfortable-appearing young woman in bed in no acute distress HENT: PERRL, poor dentition, Lungs: scattered rhonchi, elevated respiratory rate, cough with secretions Cardiovascular: RRR, no appreciable murmur, periods of tachycardia during exam, otherwise NSR on the monitor; no edema in upper extremities, no edema RLE, but slight edema to LLE (ankle); radial and dorsalis pedal pulses 2+ Abdomen: Soft, non-distended, hypo-active bowel sounds Extremities:adequate ROM, except the left ankle. Warm and dry. Redness and increased warmth to LLE/ ankle when compared to right.  Neuro: Alert and oriented grossly, follows commands, answers appropriately GU: no foley present. Psych: anxiety, pain.  Resolved Hospital Problem list     Assessment & Plan:  Severe epsis with MRSA bacteremia in the setting of IV drug use Endocarditis (hx of endocarditis w/ sub-optimal abx regimen (4 weeks, not 6)) Septic emboli to kidneys and lungs Fevers, intermittent tachycardia and tachypnea, leukocytosis; positive blood cultures for Staph aureus. ECHO positive for vegetation. LE DVT negative bilaterally. -Vancomycin, ID following, narrowed IV abx to culture results -? Fluids given 6.7L up since 2/12 -MAP goal >65, currently at 65 (2/15) -trend lactic acid (2/15 2.1 and decreasing) -monitor I/Os -O2 via [place] to keep sats > 90  IV drug use, tobacco use, cocaine use, marijuana use Currently getting pain medication including opioid analgesics. When we wean, monitor for withdrawal. Patient reports recent two year hx of sobriety, recent relapse. Hospitalized with endocarditis 4 yrs ago, left during treatment AMA. -IV dilaudid, PO Oxy IR, IV ativan PRNs -will need to plan detox with suboxone in the future -opioid detox bowel regimen in place. May need additional  medications to relieve constipation. -nicotine replacement therapy  Acute Thrombocytopenia  Acute Anemia  Possibly due to sepsis. Spleen is enlarged on CT on 2/12. Anemia from poor nutrition vs dilutional. Chest Ct neg for PE. -DIC panel (PT 16.6, INR 1.4, aPTT 31, FIB 452, ADV D-DIMER 4.17, PLT 53, No schistocytosis). -monitor CBC, signs of bleeding  AKI Small bump in BUN/Creat to 31/1.07 on 2/15. BUN trending down, creat rising to admit level (1.33), s/p reduction in boluses of fluid. -trend BUN/ creat -avoid nephrotoxic medications  Hyponatremia/ Hypokalemia Likely secondary to volume changes and poor nutrition -Monitor trends -replete K+ as needed -Telemetry  Concern for septic arthritis Right shoulder and left ankle. R shoulder aspirated, fluid evaluated. Septic joint possible even with WBCs in the 8K range, gram stain abundant WBCs, few neutrophils, no organisms. Possibly inflammatory vs reactive as well. Ortho evaluated, feels LLE is more likely to be cellulitis. -GS to evaluate for washout -IV vancomycin -pain management  Malnutrition Multiple likely etiologies including poor dentition, IVDU Currently NPO for surgical evaluation. -Dietician consult -PO meal supplementation (Ensure)  Best practice (evaluated daily)  Diet: NPO, then supplemented Pain/Anxiety/Delirium protocol (if indicated): Dilaudid, Oxy IR, Ativan VAP protocol (if indicated): NA DVT prophylaxis: Held (low platelets) GI prophylaxis: NA Glucose control: NA Mobility: Bed Disposition:ICU  Note written by [name], MS4 under the guidance of Curley Spice, MD PCCM  Goals of Care:  Last date of multidisciplinary goals of care discussion: Family and staff present:  Summary of discussion:  Follow up goals of care discussion due:  Code Status: FULL  Labs   CBC: Recent Labs  Lab 11/15/20 1337 11/17/20 0234  11/17/20 1019  WBC 15.3* 8.2  --   HGB 13.0 10.1*  --   HCT 36.4 27.9*  --   MCV  77.1* 77.5*  --   PLT 51* 40* 53*    Basic Metabolic Panel: Recent Labs  Lab 11/15/20 1735 11/16/20 1037 11/16/20 1930 11/17/20 0234 11/18/20 0147  NA 124* 130* 128* 129* 130*  K 2.6* 2.5* 4.2 3.9 3.6  CL 90* 103 101 102 103  CO2 19* 16* 18* 19* 20*  GLUCOSE 128* 119* 131* 104* 99  BUN 40* 25* 22* 22* 31*  CREATININE 1.33* 0.84 0.92 0.84 1.07*  CALCIUM 7.7* 7.0* 7.4* 7.4* 7.3*  MG 1.8 1.9  --   --   --    GFR: Estimated Creatinine Clearance: 78.5 mL/min (A) (by C-G formula based on SCr of 1.07 mg/dL (H)). Recent Labs  Lab 11/15/20 1337 11/15/20 1845 11/16/20 1930 11/16/20 2234 11/17/20 0234 11/17/20 1019 11/17/20 1150  PROCALCITON  --   --  1.97  --   --   --   --   WBC 15.3*  --   --   --  8.2  --   --   LATICACIDVEN  --    < > 3.6* 3.1*  --  2.7* 2.1*   < > = values in this interval not displayed.    Liver Function Tests: Recent Labs  Lab 11/15/20 1735 11/16/20 1037 11/17/20 1150  AST 23 27 19   ALT 12 11 11   ALKPHOS 74 71 73  BILITOT 0.9 1.7* 1.5*  PROT 6.5 5.7* 5.5*  ALBUMIN 2.1* 1.7* 1.3*   Recent Labs  Lab 11/15/20 1735  LIPASE 20   No results for input(s): AMMONIA in the last 168 hours.  ABG No results found for: PHART, PCO2ART, PO2ART, HCO3, TCO2, ACIDBASEDEF, O2SAT   Coagulation Profile: Recent Labs  Lab 11/17/20 1019  INR 1.4*  1.4*    Cardiac Enzymes: Recent Labs  Lab 11/15/20 1735  CKTOTAL 43    HbA1C: No results found for: HGBA1C  CBG: Recent Labs  Lab 11/15/20 1535  GLUCAP 145*    Review of Systems:   Negative unless otherwise indicated.  Past Medical History:  She,  has a past medical history of IV drug user, Malnutrition (HCC) (11/16/2020), Poor dentition (11/16/2020), and Tobacco dependence (11/16/2020).   Surgical History:  History reviewed. No pertinent surgical history.   Social History:   reports that she has been smoking cigarettes. She has a 18.00 pack-year smoking history. She has never used smokeless  tobacco. She reports current drug use. Drugs: Cocaine and Marijuana. She reports that she does not drink alcohol.   Family History:  Her family history is not on file.   Allergies No Known Allergies   Home Medications  Prior to Admission medications   Not on File     Critical care time:

## 2020-11-19 DIAGNOSIS — I339 Acute and subacute endocarditis, unspecified: Secondary | ICD-10-CM

## 2020-11-19 DIAGNOSIS — M5489 Other dorsalgia: Secondary | ICD-10-CM

## 2020-11-19 DIAGNOSIS — D696 Thrombocytopenia, unspecified: Secondary | ICD-10-CM

## 2020-11-19 DIAGNOSIS — M25472 Effusion, left ankle: Secondary | ICD-10-CM

## 2020-11-19 DIAGNOSIS — I330 Acute and subacute infective endocarditis: Secondary | ICD-10-CM

## 2020-11-19 DIAGNOSIS — R6520 Severe sepsis without septic shock: Secondary | ICD-10-CM

## 2020-11-19 DIAGNOSIS — A4102 Sepsis due to Methicillin resistant Staphylococcus aureus: Secondary | ICD-10-CM

## 2020-11-19 LAB — GLUCOSE, CAPILLARY
Glucose-Capillary: 66 mg/dL — ABNORMAL LOW (ref 70–99)
Glucose-Capillary: 78 mg/dL (ref 70–99)

## 2020-11-19 LAB — BASIC METABOLIC PANEL
Anion gap: 10 (ref 5–15)
BUN: 60 mg/dL — ABNORMAL HIGH (ref 6–20)
CO2: 17 mmol/L — ABNORMAL LOW (ref 22–32)
Calcium: 7.2 mg/dL — ABNORMAL LOW (ref 8.9–10.3)
Chloride: 103 mmol/L (ref 98–111)
Creatinine, Ser: 1.87 mg/dL — ABNORMAL HIGH (ref 0.44–1.00)
GFR, Estimated: 36 mL/min — ABNORMAL LOW (ref >60)
Glucose, Bld: 73 mg/dL (ref 70–99)
Potassium: 4.1 mmol/L (ref 3.5–5.1)
Sodium: 130 mmol/L — ABNORMAL LOW (ref 135–145)

## 2020-11-19 LAB — VANCOMYCIN, PEAK: Vancomycin Pk: 50 ug/mL — ABNORMAL HIGH (ref 30–40)

## 2020-11-19 MED ORDER — LACTATED RINGERS IV SOLN: INTRAVENOUS | Status: DC

## 2020-11-19 MED ORDER — VANCOMYCIN VARIABLE DOSE PER UNSTABLE RENAL FUNCTION (PHARMACIST DOSING): Status: DC

## 2020-11-19 MED ORDER — QUETIAPINE FUMARATE 25 MG PO TABS
25.0000 mg | ORAL_TABLET | Freq: Two times a day (BID) | ORAL | Status: DC
  Administered 2020-11-19 – 2020-12-29 (×79): 25 mg via ORAL
  Filled 2020-11-19 (×79): qty 1

## 2020-11-19 MED ORDER — DOXYCYCLINE HYCLATE 100 MG PO TABS
100.0000 mg | ORAL_TABLET | Freq: Two times a day (BID) | ORAL | Status: AC
  Administered 2020-11-19 – 2020-11-23 (×10): 100 mg via ORAL
  Filled 2020-11-19 (×11): qty 1

## 2020-11-19 MED ORDER — ADULT MULTIVITAMIN W/MINERALS CH
1.0000 | ORAL_TABLET | Freq: Every day | ORAL | Status: DC
  Administered 2020-11-19 – 2020-12-28 (×36): 1 via ORAL
  Filled 2020-11-19 (×38): qty 1

## 2020-11-19 MED ORDER — BISACODYL 5 MG PO TBEC: 5.0000 mg | DELAYED_RELEASE_TABLET | Freq: Every day | ORAL | Status: DC | PRN

## 2020-11-19 MED ORDER — SODIUM CHLORIDE 0.9 % IV SOLN
10.0000 mg/kg | Freq: Every day | INTRAVENOUS | Status: DC
  Administered 2020-11-19 – 2020-11-23 (×5): 770 mg via INTRAVENOUS
  Filled 2020-11-19 (×6): qty 15.4

## 2020-11-19 MED ORDER — SENNA 8.6 MG PO TABS: 1.0000 | ORAL_TABLET | Freq: Every day | ORAL | Status: DC | PRN

## 2020-11-19 NOTE — Progress Notes (Signed)
[facility] Infectious Diseases Follow Up Note  Patient Identification: Patient Name: Anne Bautista MRN: [id number] Admit Date: 11/15/2020  3:21 PM Age: 35 y.o.Today's Date: 11/19/2020   Reason for Visit: MRSA bacteremia   Principal Problem:   Severe sepsis due to methicillin resistant Staphylococcus aureus (MRSA) with acute organ dysfunction [facility]) Active Problems:   IV drug abuse (HCC)   Poor dentition   Malnutrition (HCC)   Thrombocytopenia (HCC)   Tobacco dependence   Acute cystitis without hematuria  Antibiotics: vancomycin 2/12-                    Cefepime 2/12-2/13  Interval Events: afebrile, Continues to complain of diffuse pain ? 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 Cultures NG  # Back pain - generalized and diffuse, low threshold to image the spine # Left Ankle/Foot swelling  - [place] of Bilateral LE negative for DVT. Low concern for septic joint per Ortho  # Thrombocytopenia - platelet count slowly improving  # Medication monitoring  # Withdrawals   Recommendations  Will switch Vancomycin to Daptomycin given worsening AKI and add doxycyline for 5 days for pulmonary coverage  Fu repeat blood cultures  TEE and CT surgery consult pending Will monitor back pain for now but low threshold to image CPK baseline Monitor CBC, BMP and CPK on IV antibiotics   Rest of the management as per the primary team. Thank you for the consult. Please page with pertinent questions or concerns.  ______________________________________________________________________ Subjective patient seen and examined at the bedside. Sitting up in bed, moaning and complaining of hurting all over the body. Poor historian   Vitals BP 125/63 (BP Location: Left Arm)   Pulse  (!) 108   Temp 98.1 F (36.7 C) (Axillary)   Resp (!) 41   Ht 5\' 7"  (1.702 m)   Wt 77 kg   LMP 11/10/2020   SpO2 97%   BMI 26.59 kg/m     Physical Exam Not in acite distress but moaning  Drowsy Chest - coarse breath sounds CVS- Normal s1s2, systolic murmur Abdomen - soft Extremities - tenderness in the rt shoulder, left foot/ankle and whole back Left foot is swollen  Pertinent Microbiology Results for orders placed or performed during the hospital encounter of 11/15/20  Resp Panel by RT-PCR (Flu A&B, Covid) Peripheral     Status: None   Collection Time: 11/15/20  4:10 PM   Specimen: Peripheral; Nasopharyngeal(NP) swabs in vial transport medium  Result Value Ref Range Status   SARS Coronavirus 2 by RT PCR NEGATIVE NEGATIVE Final    Comment: (NOTE) SARS-CoV-2 target nucleic acids are NOT DETECTED.  The SARS-CoV-2 RNA is generally detectable in upper respiratory specimens during the acute phase of infection. The lowest concentration of SARS-CoV-2 viral copies this assay can detect is 138 copies/mL. A negative result does not preclude SARS-Cov-2 infection and should not be used as the sole basis for treatment or other patient management decisions. A negative result may occur with  improper specimen collection/handling, submission of specimen other than nasopharyngeal swab, presence of viral mutation(s) within the areas targeted by this assay, and inadequate number of viral copies(<138 copies/mL). A negative result must be combined with clinical observations, patient history, and epidemiological information. The expected result is Negative.  Fact Sheet for  Patients:  BloggerCourse.comhttps://www.fda.gov/media/152166/download  Fact Sheet for Healthcare Providers:  SeriousBroker.ithttps://www.fda.gov/media/152162/download  This test is no t yet approved or cleared by the [place] FDA and  has been authorized for detection and/or diagnosis of SARS-CoV-2 by FDA under an Emergency Use Authorization  (EUA). This EUA will remain  in effect (meaning this test can be used) for the duration of the COVID-19 declaration under Section 564(b)(1) of the Act, 21 U.S.C.section 360bbb-3(b)(1), unless the authorization is terminated  or revoked sooner.       Influenza A by PCR NEGATIVE NEGATIVE Final   Influenza B by PCR NEGATIVE NEGATIVE Final    Comment: (NOTE) The Xpert Xpress SARS-CoV-2/FLU/RSV plus assay is intended as an aid in the diagnosis of influenza from Nasopharyngeal swab specimens and should not be used as a sole basis for treatment. Nasal washings and aspirates are unacceptable for Xpert Xpress SARS-CoV-2/FLU/RSV testing.  Fact Sheet for Patients: BloggerCourse.comhttps://www.fda.gov/media/152166/download  Fact Sheet for Healthcare Providers: SeriousBroker.ithttps://www.fda.gov/media/152162/download  This test is not yet approved or cleared by the [place] FDA and has been authorized for detection and/or diagnosis of SARS-CoV-2 by FDA under an Emergency Use Authorization (EUA). This EUA will remain in effect (meaning this test can be used) for the duration of the COVID-19 declaration under Section 564(b)(1) of the Act, 21 U.S.C. section 360bbb-3(b)(1), unless the authorization is terminated or revoked.  Performed at [facility] [place], [address] [place], [place] [address]   Blood culture (routine x 2)     Status: Abnormal   Collection Time: 11/15/20  4:10 PM   Specimen: BLOOD  Result Value Ref Range Status   Specimen Description   Final    BLOOD LEFT ANTECUBITAL Performed at [facility] [place], [address] [place], [place] [address]    Special Requests   Final    BOTTLES DRAWN AEROBIC AND ANAEROBIC Blood Culture adequate volume Performed at [facility] [place], [address] [place], [place] [address]    Culture  Setup Time   Final    GRAM POSITIVE COCCI IN BOTH AEROBIC AND ANAEROBIC BOTTLES CRITICAL RESULT CALLED TO, READ BACK BY AND VERIFIED WITH: CINDY REED,RN  @[phone]  11/16/20 EB Performed at [facility], [address] [place], [place] [address]    Culture METHICILLIN RESISTANT STAPHYLOCOCCUS AUREUS (A)  Final   Report Status 11/18/2020 FINAL  Final   Organism ID, Bacteria METHICILLIN RESISTANT STAPHYLOCOCCUS AUREUS  Final      Susceptibility   Methicillin resistant staphylococcus aureus - MIC*    CIPROFLOXACIN <=0.5 SENSITIVE Sensitive     ERYTHROMYCIN >=8 RESISTANT Resistant     GENTAMICIN <=0.5 SENSITIVE Sensitive     OXACILLIN >=4 RESISTANT Resistant     TETRACYCLINE <=1 SENSITIVE Sensitive     VANCOMYCIN 1 SENSITIVE Sensitive     TRIMETH/SULFA <=10 SENSITIVE Sensitive     CLINDAMYCIN <=0.25 SENSITIVE Sensitive     RIFAMPIN <=0.5 SENSITIVE Sensitive     Inducible Clindamycin NEGATIVE Sensitive     * METHICILLIN RESISTANT STAPHYLOCOCCUS AUREUS  Blood Culture ID Panel (Reflexed)     Status: Abnormal   Collection Time: 11/15/20  4:10 PM  Result Value Ref Range Status   Enterococcus faecalis NOT DETECTED NOT DETECTED Final   Enterococcus Faecium NOT DETECTED NOT DETECTED Final   Listeria monocytogenes NOT DETECTED NOT DETECTED Final   Staphylococcus species DETECTED (A) NOT DETECTED Final    Comment: CRITICAL RESULT CALLED TO, READ BACK BY AND VERIFIED WITH:  CINDY REED,RN  11/16/20 EB    Staphylococcus aureus (BCID) DETECTED (A) NOT DETECTED Final    Comment: Methicillin (oxacillin)-resistant Staphylococcus aureus (MRSA). MRSA is predictably resistant to beta-lactam antibiotics (except ceftaroline). Preferred therapy is vancomycin unless clinically contraindicated. Patient requires contact precautions if  hospitalized. CRITICAL RESULT CALLED TO, READ BACK BY AND VERIFIED WITH: CINDY REED,RN  11/16/20 EB    Staphylococcus epidermidis NOT DETECTED NOT DETECTED Final   Staphylococcus lugdunensis NOT DETECTED NOT DETECTED Final   Streptococcus species NOT DETECTED NOT DETECTED Final   Streptococcus agalactiae NOT DETECTED  NOT DETECTED Final   Streptococcus pneumoniae NOT DETECTED NOT DETECTED Final   Streptococcus pyogenes NOT DETECTED NOT DETECTED Final   A.calcoaceticus-baumannii NOT DETECTED NOT DETECTED Final   Bacteroides fragilis NOT DETECTED NOT DETECTED Final   Enterobacterales NOT DETECTED NOT DETECTED Final   Enterobacter cloacae complex NOT DETECTED NOT DETECTED Final   Escherichia coli NOT DETECTED NOT DETECTED Final   Klebsiella aerogenes NOT DETECTED NOT DETECTED Final   Klebsiella oxytoca NOT DETECTED NOT DETECTED Final   Klebsiella pneumoniae NOT DETECTED NOT DETECTED Final   Proteus species NOT DETECTED NOT DETECTED Final   Salmonella species NOT DETECTED NOT DETECTED Final   Serratia marcescens NOT DETECTED NOT DETECTED Final   Haemophilus influenzae NOT DETECTED NOT DETECTED Final   Neisseria meningitidis NOT DETECTED NOT DETECTED Final   Pseudomonas aeruginosa NOT DETECTED NOT DETECTED Final   Stenotrophomonas maltophilia NOT DETECTED NOT DETECTED Final   Candida albicans NOT DETECTED NOT DETECTED Final   Candida auris NOT DETECTED NOT DETECTED Final   Candida glabrata NOT DETECTED NOT DETECTED Final   Candida krusei NOT DETECTED NOT DETECTED Final   Candida parapsilosis NOT DETECTED NOT DETECTED Final   Candida tropicalis NOT DETECTED NOT DETECTED Final   Cryptococcus neoformans/gattii NOT DETECTED NOT DETECTED Final   Meth resistant mecA/C and MREJ DETECTED (A) NOT DETECTED Final    Comment: CRITICAL RESULT CALLED TO, READ BACK BY AND VERIFIED WITH: CINDY REED,RN  11/16/20 EB Performed at [facility], [address] [place], [place] [address]   Blood culture (routine x 2)     Status: Abnormal   Collection Time: 11/15/20  4:25 PM   Specimen: BLOOD  Result Value Ref Range Status   Specimen Description   Final    BLOOD LEFT ANTECUBITAL Performed at [facility], [address] [place], [place] [address]    Special Requests   Final    BOTTLES DRAWN  AEROBIC ONLY Blood Culture results may not be optimal due to an inadequate volume of blood received in culture bottles Performed at [facility], [address] [place], [place] [address]    Culture  Setup Time   Final    GRAM POSITIVE COCCI IN CLUSTERS AEROBIC BOTTLE ONLY CRITICAL VALUE NOTED.  VALUE IS CONSISTENT WITH PREVIOUSLY REPORTED AND CALLED VALUE.    Culture (A)  Final    STAPHYLOCOCCUS AUREUS SUSCEPTIBILITIES PERFORMED ON PREVIOUS CULTURE WITHIN THE LAST 5 DAYS. Performed at [facility], [address], [place], [place] [address]    Report Status 11/18/2020 FINAL  Final  Urine culture     Status: Abnormal   Collection Time: 11/15/20  7:47 PM   Specimen: Urine, Catheterized  Result Value Ref Range Status   Specimen Description   Final    URINE, CATHETERIZED Performed at [facility], [address] [place], [place] [address]  Special Requests   Final    Normal Performed at [facility], [address] [place], [place] [address]    Culture (A)  Final    >=100,000 COLONIES/mL METHICILLIN RESISTANT STAPHYLOCOCCUS AUREUS   Report Status 11/18/2020 FINAL  Final   Organism ID, Bacteria METHICILLIN RESISTANT STAPHYLOCOCCUS AUREUS (A)  Final      Susceptibility   Methicillin resistant staphylococcus aureus - MIC*    CIPROFLOXACIN <=0.5 SENSITIVE Sensitive     GENTAMICIN <=0.5 SENSITIVE Sensitive     NITROFURANTOIN <=16 SENSITIVE Sensitive     OXACILLIN >=4 RESISTANT Resistant     TETRACYCLINE <=1 SENSITIVE Sensitive     VANCOMYCIN <=0.5 SENSITIVE Sensitive     TRIMETH/SULFA <=10 SENSITIVE Sensitive     CLINDAMYCIN <=0.25 SENSITIVE Sensitive     RIFAMPIN <=0.5 SENSITIVE Sensitive     Inducible Clindamycin NEGATIVE Sensitive     * >=100,000 COLONIES/mL METHICILLIN RESISTANT STAPHYLOCOCCUS AUREUS  Culture, blood (single)     Status: Abnormal (Preliminary result)   Collection Time: 11/15/20  9:35 PM   Specimen: BLOOD RIGHT HAND   Result Value Ref Range Status   Specimen Description   Final    BLOOD RIGHT HAND Performed at [facility], [address] [place], [place] [address]    Special Requests   Final    BOTTLES DRAWN AEROBIC AND ANAEROBIC Blood Culture adequate volume Performed at [facility], [address] [place], [place] [address]    Culture  Setup Time   Final    GRAM POSITIVE COCCI IN CLUSTERS ANAEROBIC BOTTLE ONLY CRITICAL VALUE NOTED.  VALUE IS CONSISTENT WITH PREVIOUSLY REPORTED AND CALLED VALUE.    Culture (A)  Final    STAPHYLOCOCCUS AUREUS SUSCEPTIBILITIES PERFORMED ON PREVIOUS CULTURE WITHIN THE LAST 5 DAYS. Performed at [facility], [address] [place], [place] [address]    Report Status PENDING  Incomplete  Culture, blood (routine x 2)     Status: None (Preliminary result)   Collection Time: 11/17/20  9:28 AM   Specimen: BLOOD RIGHT FOREARM  Result Value Ref Range Status   Specimen Description BLOOD RIGHT FOREARM  Final   Special Requests   Final    BOTTLES DRAWN AEROBIC AND ANAEROBIC Blood Culture results may not be optimal due to an inadequate volume of blood received in culture bottles   Culture   Final    NO GROWTH 2 DAYS Performed at [facility], [address], [place], [place] [address]    Report Status PENDING  Incomplete  Culture, blood (routine x 2)     Status: None (Preliminary result)   Collection Time: 11/17/20  9:39 AM   Specimen: BLOOD RIGHT HAND  Result Value Ref Range Status   Specimen Description BLOOD RIGHT HAND  Final   Special Requests   Final    BOTTLES DRAWN AEROBIC AND ANAEROBIC Blood Culture results may not be optimal due to an inadequate volume of blood received in culture bottles   Culture   Final    NO GROWTH 2 DAYS Performed at [facility], [address], [place], [place] [address]    Report Status PENDING  Incomplete  MRSA PCR Screening     Status: Abnormal   Collection Time: 11/17/20 11:38 AM    Specimen: Nasopharyngeal  Result Value Ref Range Status   MRSA by PCR POSITIVE (A) NEGATIVE Final    Comment:  The GeneXpert MRSA Assay (FDA approved for NASAL specimens only), is one component of a comprehensive MRSA colonization surveillance program. It is not intended to diagnose MRSA infection nor to guide or monitor treatment for MRSA infections. RESULT CALLED TO, READ BACK BY AND VERIFIED WITH: R ROBERTS RN 1400 11/17/20 A BROWNING Performed at [facility], [address] [place], [place] [address]   Body fluid culture w Gram Stain     Status: None (Preliminary result)   Collection Time: 11/17/20  4:05 PM   Specimen: Synovium; Body Fluid  Result Value Ref Range Status   Specimen Description SYNOVIAL RIGHT SHOULDER  Final   Special Requests NONE  Final   Gram Stain   Final    ABUNDANT WBC PRESENT, PREDOMINANTLY PMN NO ORGANISMS SEEN    Culture   Final    CULTURE REINCUBATED FOR BETTER GROWTH Performed at [facility], [address], [place], [place] [address]    Report Status PENDING  Incomplete     Pertinent Lab. CBC Latest Ref Rng & Units 11/17/2020 11/17/2020 11/15/2020  WBC 4.0 - 10.5 K/uL - 8.2 15.3(H)  Hemoglobin 12.0 - 15.0 g/dL - 10.1(L) 13.0  Hematocrit 36.0 - 46.0 % - 27.9(L) 36.4  Platelets 150 - 400 K/uL 53(L) 40(L) 51(L)   CMP Latest Ref Rng & Units 11/19/2020 11/18/2020 11/17/2020  Glucose 70 - 99 mg/dL 73 99 353(I)  BUN 6 - 20 mg/dL 14(E) 31(V) 40(G)  Creatinine 0.44 - 1.00 mg/dL 8.67(Y) 1.95(K) 9.32  Sodium 135 - 145 mmol/L 130(L) 130(L) 129(L)  Potassium 3.5 - 5.1 mmol/L 4.1 3.6 3.9  Chloride 98 - 111 mmol/L 103 103 102  CO2 22 - 32 mmol/L 17(L) 20(L) 19(L)  Calcium 8.9 - 10.3 mg/dL 7.2(L) 7.3(L) 7.4(L)  Total Protein 6.5 - 8.1 g/dL - - 5.5(L)  Total Bilirubin 0.3 - 1.2 mg/dL - - 1.5(H)  Alkaline Phos 38 - 126 U/L - - 73  AST 15 - 41 U/L - - 19  ALT 0 - 44 U/L - - 11     Pertinent Imaging today Plain films and CT images  have been personally visualized and interpreted; radiology reports have been reviewed. Decision making incorporated into the Impression / Recommendations.  I have spent approx 30 minutes for this patient encounter including review of prior medical records with greater than 50% of time being face to face and coordination of their care.  Electronically signed by:   Odette Fraction, MD Infectious Disease Physician [facility] for Infectious Disease Pager: [phone]

## 2020-11-19 NOTE — Progress Notes (Signed)
Received verbal consent from patient to contact friend listed in the chart-- [name]. RN Elita Quick Rivera-Montejano witnessed consent.  Called [name] Dingwall [phone] at [phone]. Left voicemail.  [name], MS4

## 2020-11-19 NOTE — Progress Notes (Signed)
Discussed with CC team with Rt shoulder aspirate on 2/14 growing staph aureus which is concerning for septic arthritis  Would possibly need surgical intervention from source control standpoint and follow up with Ortho.  Following   Odette Fraction, MD Infectious Diseases [facility]

## 2020-11-19 NOTE — Progress Notes (Addendum)
NAME:  Anne Bautista, MRN:  [id number], DOB:  Mar 09, 1986, LOS: 3 ADMISSION DATE:  11/15/2020, CONSULTATION DATE:  02.14.2022 REFERRING MD:  Sunnie Nielsen, CHIEF COMPLAINT:  Sepsis  Brief History:  35yo female presented with cough and myalgias, found to be bacteremic with known history of IVDU  History of Present Illness:  Anne Bautista is a 35yo female with PMH significant for IVDU, tobacco abuse, and malnutrition who presented to the ED with complaints of cough and [name].  She reported "everything hurts".  Patient reports the symptoms have been present for approximately 1 week prior to admission.  Also reports fever upwards of 101.5, nonproductive cough and generalized weakness with ambulatory dysfunction.  Patient reports long running history of IV drug abuse with recent relapse 3 days prior to admission.  She does report prior history of endocarditis with hospitalization approximately 4 years ago where she left AGAINST MEDICAL ADVICE.  On arrival patient was seen afebrile, tachycardic, and tachypneic.  Significant lab work included: Sodium 124, potassium 2.6, glucose 128, BUN 40, creatinine 1.33, albumin 2.1 lactic acid 3.6, WBC 15.3, platelets 51.  Toxicology positive for cocaine urinalysis positive for trace leukocytes and nitrates.  Admission chest x-ray significant for bilateral lung opacities likely consistent with residual scarring from pulmonary emboli.  CT abdomen and pelvis consistent with multiple subtle hypoenhancing lesions of the renal cortical consistent with septic emboli and splenomegaly.  CTA chest negative for PE but revealed numerous bilateral cavitary nodules throughout the lungs most consistent with septic emboli.  Patient was initially admitted to hospitalist team for further management and treatment of bacteremia.  However morning of 2/14 patient began developing worsening signs of severe sepsis including fever with T-max of 100.2, tachycardia with a heart rate of 140, tachypnea  with respiratory rates in the upper 40s to 50s.  Blood cultures positive for Staph aureus. PCCM was consulted for further management and patient transferred to the ICU on 02.14.2022.  Past Medical History:  IV drug abuse Tobacco abuse Malnutrition Endocarditis  Significant Hospital Events:  2/13 Admitted  Consults:  Infectious Disease  Procedures:  2/14 Right shoulder aspiration   Significant Diagnostic Tests:  Covid 2/12 > negative Blood cultures > positive for staph aureus Urine culture 2/12 > positive staph aureus  CT abd/ pelvis 2/12> mult. Hypo-enhancing lesions renal cortices, suspect septic emboli; splenomegaly CT angio chest 2/12> no PE; bilateral, numerous cavitary nodules, likely septic emboli vs vasculitis vs metastatic disease; pleural effusions, trace right, small left CT head w/o contrast 2/12> brain appears normal; right maxillary sinus, possible rhinosinusitis  ECHO 2/14> positive for vegetation on tricuspid valve LE Dopplers 2/14> negative for DVT bilaterally R Shoulder Joint Aspiration>  No organisms seen,> NG <24hrs  Micro Data:  Covid 2/12 > negative Blood cultures > positive for Staph aureus Urine culture 2/12 > positive Staph aureus R Shoulder aspiration> negative   Antimicrobials:  Cefepime 2/12 >2/13 Flagyl 2/13 >2/13 Vancomycin 2/12 >>  Interim History / Subjective:  Patient reports getting some sleep overnight; pain is a little better controlled. Right shoulder, left ankle and back are issues. Receiving opioid pain medication, anxiolytics. No BM recently (this admission) and does not remember last BM.  Objective   Blood pressure 106/62, pulse 100, temperature 98.1 F (36.7 C), temperature source Axillary, resp. rate (!) 38, height 5\' 7"  (1.702 m), weight 77 kg, last menstrual period 11/10/2020, SpO2 97 %.        Intake/Output Summary (Last 24 hours) at 11/19/2020 11/21/2020 Last data filed at 11/19/2020  0700 Gross per 24 hour  Intake 974.37 ml   Output 375 ml  Net 599.37 ml   Filed Weights   11/16/20 1147 11/17/20 0515 11/17/20 1100  Weight: 71.5 kg 78 kg 77 kg    Examination: General: Uncomfortable-appearing young woman in bed with moderate acute anxiety, keening HENT: PERRL, poor dentition, Lungs: scattered rhonchi, elevated respiratory rate, cough with secretions Cardiovascular: RRR, no appreciable murmur, periods of tachycardia during exam, otherwise NSR on the monitor; no edema in upper extremities, no edema RLE, but slight edema to LLE (ankle); radial and dorsalis pedal pulses 2+ Abdomen: Soft, non-distended, hypo-active bowel sounds Extremities:adequate ROM, except the left ankle. Warm and dry. Redness and increased warmth to LLE/ ankle, tracking up the limb, when compared to right.  Neuro: Alert and oriented grossly, follows commands, minimal answers this am GU: purewick in place Psych: anxiety, pain with exam,   Resolved Hospital Problem list     Assessment & Plan:  Severe epsis with MRSA bacteremia in the setting of IV drug use Endocarditis (hx of endocarditis w/ sub-optimal abx regimen (4 weeks, not 6)) Septic emboli to kidneys and lungs Fevers, intermittent tachycardia and tachypnea, leukocytosis; positive blood cultures for Staph aureus. ECHO positive for vegetation. LE DVT negative bilaterally. -Vancomycin, ID following, Vanc trough elevated on 2/16 at 50 -Fluids given; 6.7L up since 2/12, may need more -MAP goal >65, currently at 72 (2/16) -trend lactic acid (2/15 2.1 and decreasing) -monitor I/Os -O2 via [place] to keep sats > 90  IV drug use, tobacco use, cocaine use, marijuana use Currently getting pain medication including opioid analgesics. When we wean, monitor for withdrawal. Patient reports recent two year hx of sobriety, recent relapse. Hospitalized with endocarditis 4 yrs ago, left during treatment AMA. -IV dilaudid, PO Oxy IR, IV ativan PRN -Scheduled PO Oxy IR, ? Scheduled PO ativan -will need to  plan detox with suboxone in the future -Bowel regimen while on opioids (miralax, colace) -nicotine replacement therapy  Acute Thrombocytopenia  Acute Anemia  Possibly due to sepsis. Spleen is enlarged on CT on 2/12. Anemia from poor nutrition vs dilutional. Chest Ct neg for PE. -DIC panel (PT 16.6, INR 1.4, aPTT 31, FIB 452, ADV D-DIMER 4.17, PLT 53, No schistocytosis) unremarkable. -monitor CBC, signs of bleeding  AKI Larger bump in BUN/Creat on 2/16 to 60/1.87 from 31/1.07 on 2/15.Creat rising to admit level (1.33), s/p reduction in boluses of fluid vs vancomycin tx. -renal adjustment to vanc dosing -IV fluids gentle hydration -trend BUN/ creat -avoid nephrotoxic medications  Hyponatremia/ Hypokalemia Likely secondary to volume changes and poor nutrition -Monitor trends -replete K+ as needed -Telemetry  Concern for septic arthritis Right shoulder Concern for cellulitis on Left ankle Right shoulder and left ankle. R shoulder aspirated, fluid evaluated. Septic joint possible even with WBCs in the 8K range, gram stain abundant WBCs, few neutrophils, no organisms. Possibly inflammatory vs reactive as well. Ortho evaluated, feels LLE is more likely to be cellulitis. -GS to evaluate for washout, cultures no growth <24hrs -IV vancomycin -pain management -needs diet while waiting for culture data  Malnutrition Multiple likely etiologies including poor dentition, IVDU Currently NPO w/sips for meds for surgical evaluation. -Dietician consult -PO meal supplementation (Ensure)  Best practice (evaluated daily)  Diet: NPO w/ sips for meds, then supplemented Pain/Anxiety/Delirium protocol (if indicated): Dilaudid, Oxy IR, Ativan VAP protocol (if indicated): NA DVT prophylaxis: Held (low platelets) GI prophylaxis: NA Glucose control: NA Mobility: Bed Disposition:ICU  Note written by [name], MS4  under the guidance of D. McQuaid, MD PCCM  Goals of Care:  Last date of  multidisciplinary goals of care discussion: Family and staff present:  Summary of discussion:  Follow up goals of care discussion due:  Code Status: FULL  Labs   CBC: Recent Labs  Lab 11/15/20 1337 11/17/20 0234 11/17/20 1019  WBC 15.3* 8.2  --   HGB 13.0 10.1*  --   HCT 36.4 27.9*  --   MCV 77.1* 77.5*  --   PLT 51* 40* 53*    Basic Metabolic Panel: Recent Labs  Lab 11/15/20 1735 11/16/20 1037 11/16/20 1930 11/17/20 0234 11/18/20 0147 11/19/20 0241  NA 124* 130* 128* 129* 130* 130*  K 2.6* 2.5* 4.2 3.9 3.6 4.1  CL 90* 103 101 102 103 103  CO2 19* 16* 18* 19* 20* 17*  GLUCOSE 128* 119* 131* 104* 99 73  BUN 40* 25* 22* 22* 31* 60*  CREATININE 1.33* 0.84 0.92 0.84 1.07* 1.87*  CALCIUM 7.7* 7.0* 7.4* 7.4* 7.3* 7.2*  MG 1.8 1.9  --   --   --   --    GFR: Estimated Creatinine Clearance: 44.9 mL/min (A) (by C-G formula based on SCr of 1.87 mg/dL (H)). Recent Labs  Lab 11/15/20 1337 11/15/20 1845 11/16/20 1930 11/16/20 2234 11/17/20 0234 11/17/20 1019 11/17/20 1150  PROCALCITON  --   --  1.97  --   --   --   --   WBC 15.3*  --   --   --  8.2  --   --   LATICACIDVEN  --    < > 3.6* 3.1*  --  2.7* 2.1*   < > = values in this interval not displayed.    Liver Function Tests: Recent Labs  Lab 11/15/20 1735 11/16/20 1037 11/17/20 1150  AST 23 27 19   ALT 12 11 11   ALKPHOS 74 71 73  BILITOT 0.9 1.7* 1.5*  PROT 6.5 5.7* 5.5*  ALBUMIN 2.1* 1.7* 1.3*   Recent Labs  Lab 11/15/20 1735  LIPASE 20   No results for input(s): AMMONIA in the last 168 hours.  ABG No results found for: PHART, PCO2ART, PO2ART, HCO3, TCO2, ACIDBASEDEF, O2SAT   Coagulation Profile: Recent Labs  Lab 11/17/20 1019  INR 1.4*  1.4*    Cardiac Enzymes: Recent Labs  Lab 11/15/20 1735  CKTOTAL 43    HbA1C: No results found for: HGBA1C  CBG: Recent Labs  Lab 11/15/20 1535 11/19/20 0710  GLUCAP 145* 66*    Review of Systems:   Negative unless otherwise  indicated.  Past Medical History:  She,  has a past medical history of IV drug user, Malnutrition (HCC) (11/16/2020), Poor dentition (11/16/2020), and Tobacco dependence (11/16/2020).   Surgical History:  History reviewed. No pertinent surgical history.   Social History:   reports that she has been smoking cigarettes. She has a 18.00 pack-year smoking history. She has never used smokeless tobacco. She reports current drug use. Drugs: Cocaine and Marijuana. She reports that she does not drink alcohol.   Family History:  Her family history is not on file.   Allergies No Known Allergies   Home Medications  Prior to Admission medications   Not on File     Critical care time:

## 2020-11-19 NOTE — Progress Notes (Signed)
Antibiotic Change   Indication: Bacteremia with septic emboli   Antibiotic Change: STOP vancomycin; START daptomycin 10mg /kg daily AND doxycycline 100 mg PO BID   Antibiotic change made due to fluctuations in SCr.   Authorizing provider: Dr. , MD  Karene Fry, PharmD-Candidate 11/20/19 Infectious Diseases Consult Service

## 2020-11-19 NOTE — Consult Note (Addendum)
[address]       [place] [address]             [phone]        [name] [facility] Medical Record #[id number] Date of Birth: 07/15/1986  Referring: Lupita Leash, MD Primary Care: Patient, No Pcp Per Primary Cardiologist:No primary care provider on file.  Chief Complaint:    Chief Complaint  Patient presents with  . Generalized Body Aches    History of Present Illness:     [name] is a 35 year old female with a past history of IV drug and tobacco abuse.  Per chart review, she had an episode of endocarditis about 4 years ago wherein she left the hospital AMA prior to completing antibiotic treatment.  She presented to the emergency room on 11/15/2020 with complaints of shortness of breath, weakness, lethargy, myalgias, and reported fever of 105 F.  In the emergency room, she was found to be tachycardic but was not febrile.  Urine drug screen was positive for cocaine.  She was observed to have several tracts on her hands and antecubital fossa.  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  Urinalysis positive for urinary tract infection.  CT angiogram confirmed multiple cavitary lesions consistent with septic emboli but no evidence of pulmonary embolus.  Blood cultures were obtained as well as urine culture.  She was started on vancomycin and Maxipime empirically.  Blood cultures were positive for methicillin-resistant Staph aureus.   Infectious disease service was consulted.  The antibiotics were narrowed to vancomycin only.  She was admitted to the hospital with diagnosis of sepsis.  After complaining of right shoulder pain and left ankle pain, the orthopedic service was consulted.  The right shoulder was aspirated and cultures of this fluid rare Staph aureus.  The culture has been reintubated for better growth..  An echocardiogram was obtained on 11/17/2020.  This demonstrated normal left ventricular  function, no pericardial effusion.  There was no significant valvular dysfunction but there was a 2 x 1 cm tricuspid valve vegetation that appears to be on the atrial surface of the valve.  There was only trivial tricuspid insufficiency noted. CT surgery has been consulted for recommendations regarding management of this lesion. Since admission, [name] has been managed for acute sepsis with aggressive IV hydration.  Antibiotics have again been adjusted by the ID service.  She is now on Cubicin.  Respiratory status has been tenuous.  She remains tachypneic with respiratory rates in the 40s.  She remains thrombocytopenic with platelet count of 53,000.  DIC panel was unremarkable. At the time of this encounter, [name] was awake and generally cooperative but was moaning with every breath making communication very difficult..  She acknowledged that she was hurting "all over".    Current Activity/ Functional Status:    Zubrod Score: At the time of surgery this patient's most appropriate activity status/level should be described as: []     0    Normal activity, no symptoms []     1    Restricted in physical strenuous activity but ambulatory, able to do out light work []     2    Ambulatory and capable of self care, unable to do work activities, up and about                 more than 50%  Of the time                            [  x]    3    Only limited self care, in bed greater than 50% of waking hours []     4    Completely disabled, no self care, confined to bed or chair []     5    Moribund  Past Medical History:  Diagnosis Date  . IV drug user   . Malnutrition (HCC) 11/16/2020  . Poor dentition 11/16/2020  . Tobacco dependence 11/16/2020    History reviewed. No pertinent surgical history.  Social History   Tobacco Use  Smoking Status Current Every Day Smoker  . Packs/day: 1.00  . Years: 18.00  . Pack years: 18.00  . Types: Cigarettes  Smokeless Tobacco Never Used  Tobacco Comment    last used 2 weeks ago    Social History   Substance and Sexual Activity  Alcohol Use Never     No Known Allergies  Current Facility-Administered Medications  Medication Dose Route Frequency Provider Last Rate Last Admin  . 0.9 %  sodium chloride infusion   Intravenous PRN 11/18/2020, DO   Stopped at 11/18/20 1145  . acetaminophen (TYLENOL) tablet 650 mg  650 mg Oral Q6H PRN Steffanie Dunn, MD   650 mg at 11/16/20 2057   Or  . acetaminophen (TYLENOL) suppository 650 mg  650 mg Rectal Q6H PRN 11/18/20, MD      . bisacodyl (DULCOLAX) EC tablet 5 mg  5 mg Oral Daily PRN 2058 B, MD      . chlorhexidine (PERIDEX) 0.12 % solution 15 mL  15 mL Mouth Rinse BID Jonah Blue P, DO   15 mL at 11/19/20 0923  . Chlorhexidine Gluconate Cloth 2 % PADS 6 each  6 each Topical Q0600 Karie Fetch P, DO      . DAPTOmycin (CUBICIN) 770 mg in sodium chloride 0.9 % IVPB  10 mg/kg Intravenous Q2000 11/21/20, MD 230.8 mL/hr at 11/19/20 1013 770 mg at 11/19/20 1013  . docusate sodium (COLACE) capsule 100 mg  100 mg Oral BID 11/21/20 B, MD   100 mg at 11/19/20 Max Fickle  . doxycycline (VIBRA-TABS) tablet 100 mg  100 mg Oral Q12H 11/21/20, MD   100 mg at 11/19/20 1008  . feeding supplement (ENSURE ENLIVE / ENSURE PLUS) liquid 237 mL  237 mL Oral TID BM Odette Fraction P, DO   237 mL at 11/19/20 1008  . folic acid (FOLVITE) tablet 1 mg  1 mg Oral Daily Karie Fetch, MD   1 mg at 11/19/20 0930  . HYDROmorphone (DILAUDID) injection 1-2 mg  1-2 mg Intravenous Q4H PRN Jonah Blue, MD   1 mg at 11/19/20 0924  . lactated ringers infusion   Intravenous Continuous Lupita Leash, MD 75 mL/hr at 11/19/20 0932 New Bag at 11/19/20 0932  . LORazepam (ATIVAN) injection 0.5-1 mg  0.5-1 mg Intravenous Q6H PRN 11/21/20 B, MD   1 mg at 11/19/20 1121  . MEDLINE mouth rinse  15 mL Mouth Rinse q12n4p Keydi, Giel P, DO   15 mL at 11/19/20 1140  . metoprolol tartrate  (LOPRESSOR) injection 2.5 mg  2.5 mg Intravenous Q6H PRN Regalado, Belkys A, MD   2.5 mg at 11/17/20 0900  . multivitamin with minerals tablet 1 tablet  1 tablet Oral Daily Manandhar, Sabina, MD      . mupirocin ointment (BACTROBAN) 2 % 1 application  1 application Nasal BID Lukisha, Procida, DO   1 application at 11/19/20 0932  .  nicotine (NICODERM CQ - dosed in mg/24 hours) patch 14 mg  14 mg Transdermal Daily PRN Jonah BlueYates, Jennifer, MD      . oxyCODONE (Oxy IR/ROXICODONE) immediate release tablet 10 mg  10 mg Oral Q6H Max FickleMcQuaid, Douglas B, MD   10 mg at 11/19/20 1139  . polyethylene glycol (MIRALAX / GLYCOLAX) packet 17 g  17 g Oral BID Max FickleMcQuaid, Douglas B, MD   17 g at 11/19/20 0923  . QUEtiapine (SEROQUEL) tablet 25 mg  25 mg Oral BID Dellia Cloudoe, Benjamin, MD   25 mg at 11/19/20 1139  . senna (SENOKOT) tablet 8.6 mg  1 tablet Oral Daily PRN Max FickleMcQuaid, Douglas B, MD      . sodium chloride flush (NS) 0.9 % injection 3 mL  3 mL Intravenous Q12H Jonah BlueYates, Jennifer, MD   3 mL at 11/19/20 0933  . thiamine tablet 100 mg  100 mg Oral Daily Jonah BlueYates, Jennifer, MD   100 mg at 11/19/20 [id number]    No medications prior to admission.    History reviewed. No pertinent family history.   Review of Systems:   ROS  Patient moaning continuously, unable to participate with review of systems.  Physical Exam: BP 125/63 (BP Location: Left Arm)   Pulse (!) 108   Temp (!) 97.4 F (36.3 C) (Oral)   Resp (!) 41   Ht 5\' 7"  (1.702 m)   Wt 77 kg   LMP 11/10/2020   SpO2 97%   BMI 26.59 kg/m    General appearance: alert and severe distress Head: Normocephalic, without obvious abnormality, atraumatic Neck: no adenopathy, no carotid bruit, no JVD and supple, symmetrical, trachea midline Lymph nodes: Cervical, supraclavicular, and axillary nodes normal. and No obvious cervical or clavicular adenopathy Resp: Around 40 breaths/min.  She is moaning constantly throughout the exam.  She has coarse rhonchi bilaterally. Cardio: Sinus  tachycardia.  Unable to discern heart sounds due to her constant moaning. Extremities: Warm and well-perfused, no obvious deformities.  The medial dorsal service of the left foot is inflamed and tender.  (This is been evaluated by the orthopedic team)  Diagnostic Studies & Laboratory data:  ECHOCARDIOGRAM REPORT       Patient Name:  Anne CoulterLAURA Stamps Date of Exam: 11/17/2020  Medical Rec #: [id number]  Height:    67.0 in  Accession #:  [id number] Weight:    172.0 lb  Date of Birth: 06/28/1986  BSA:     1.896 m  Patient Age:  34 years  BP:      120/64 mmHg  Patient Gender: F      HR:      132 bpm.  Exam Location: Inpatient   Procedure: 2D Echo, Color Doppler and Cardiac Doppler   REPORT CONTAINS CRITICAL RESULT   Indications:  Endocarditis.; R00.0 Tachycardia    History:    Patient has no prior history of Echocardiogram  examinations.         Signs/Symptoms:Shortness of Breath; Risk Factors:Current  Smoker.         IVDU.    Sonographer:  Sheralyn Boatmanina West RDCS  Referring Phys: [id number] JENNIFER YATES     Sonographer Comments: Technically difficult study due to poor echo  windows. Patient moaning in pain throughout test  IMPRESSIONS    1. Left ventricular ejection fraction, by estimation, is 55 to 60%. The  left ventricle has normal function. The left ventricle has no regional  wall motion abnormalities. Left ventricular diastolic parameters are  consistent with Grade I diastolic  dysfunction (  impaired relaxation).  2. Right ventricular systolic function is normal. The right ventricular  size is normal. There is normal pulmonary artery systolic pressure. The  estimated right ventricular systolic pressure is 22.4 mmHg.  3. The mitral valve is normal in structure. No evidence of mitral valve  regurgitation. No evidence of mitral stenosis.  4. 2 x 1 cm mobile vegetation attached to the tricuspid valve.  5. The aortic  valve is tricuspid. Aortic valve regurgitation is not  visualized. No aortic stenosis is present.  6. The inferior vena cava is normal in size with greater than 50%  respiratory variability, suggesting right atrial pressure of 3 mmHg.   FINDINGS  Left Ventricle: Left ventricular ejection fraction, by estimation, is 55  to 60%. The left ventricle has normal function. The left ventricle has no  regional wall motion abnormalities. The left ventricular internal cavity  size was normal in size. There is  no left ventricular hypertrophy. Left ventricular diastolic parameters  are consistent with Grade I diastolic dysfunction (impaired relaxation).   Right Ventricle: The right ventricular size is normal. No increase in  right ventricular wall thickness. Right ventricular systolic function is  normal. There is normal pulmonary artery systolic pressure. The tricuspid  regurgitant velocity is 2.20 m/s, and  with an assumed right atrial pressure of 3 mmHg, the estimated right  ventricular systolic pressure is 22.4 mmHg.   Left Atrium: Left atrial size was normal in size.   Right Atrium: Right atrial size was normal in size.   Pericardium: There is no evidence of pericardial effusion.   Mitral Valve: The mitral valve is normal in structure. There is mild  calcification of the mitral valve leaflet(s). No evidence of mitral valve  regurgitation. No evidence of mitral valve stenosis.   Tricuspid Valve: 2 x 1 cm mobile vegetation attached to the tricuspid  valve. The tricuspid valve is normal in structure. Tricuspid valve  regurgitation is trivial. No evidence of tricuspid stenosis.   Aortic Valve: The aortic valve is tricuspid. Aortic valve regurgitation is  not visualized. No aortic stenosis is present.   Pulmonic Valve: The pulmonic valve was normal in structure. Pulmonic valve  regurgitation is not visualized.   Aorta: The aortic root is normal in size and structure.   Venous: The  inferior vena cava is normal in size with greater than 50%  respiratory variability, suggesting right atrial pressure of 3 mmHg.   IAS/Shunts: No atrial level shunt detected by color flow Doppler.     LEFT VENTRICLE  PLAX 2D  LVIDd:     4.70 cm   Diastology  LVIDs:     3.20 cm   LV e' medial:  10.10 cm/s  LV PW:     1.00 cm   LV E/e' medial: 10.6  LV IVS:    1.10 cm   LV e' lateral:  12.70 cm/s  LVOT diam:   1.80 cm   LV E/e' lateral: 8.4  LV SV:     60  LV SV Index:  32  LVOT Area:   2.54 cm    LV Volumes (MOD)  LV vol d, MOD A2C: 30.6 ml  LV vol d, MOD A4C: 63.2 ml  LV vol s, MOD A2C: 13.7 ml  LV vol s, MOD A4C: 23.5 ml  LV SV MOD A2C:   16.9 ml  LV SV MOD A4C:   63.2 ml  LV SV MOD BP:   26.3 ml   RIGHT VENTRICLE  IVC  RV S prime:   15.20 cm/s IVC diam: 1.70 cm  TAPSE (M-mode): 1.8 cm   LEFT ATRIUM       Index   RIGHT ATRIUM     Index  LA diam:    3.00 cm 1.58 cm/m RA Area:   9.90 cm  LA Vol (A2C):  11.5 ml 6.06 ml/m RA Volume:  19.70 ml 10.39 ml/m  LA Vol (A4C):  17.1 ml 9.02 ml/m  LA Biplane Vol: 15.2 ml 8.01 ml/m  AORTIC VALVE  LVOT Vmax:  178.00 cm/s  LVOT Vmean: 123.000 cm/s  LVOT VTI:  0.236 m    AORTA  Ao Root diam: 2.90 cm  Ao Asc diam: 2.80 cm   MITRAL VALVE        TRICUSPID VALVE  MV Area (PHT): 4.31 cm   TR Peak grad:  19.4 mmHg  MV Decel Time: 176 msec   TR Vmax:    220.00 cm/s  MV E velocity: 107.00 cm/s  MV A velocity: 107.00 cm/s SHUNTS  MV E/A ratio: 1.00     Systemic VTI: 0.24 m               Systemic Diam: 1.80 cm   Marca Ancona MD  Electronically signed by Marca Ancona MD  Signature Date/Time: 11/17/2020/3:36:26 PM        Recent Radiology Findings:   VAS [place] LOWER EXTREMITY VENOUS (DVT)  Result Date: 11/17/2020  Lower Venous DVT Study Indications: Swelling, LT>RT.  Comparison Study: No prior  studies. Performing Technologist: Jean Rosenthal RDMS  Examination Guidelines: A complete evaluation includes B-mode imaging, spectral Doppler, color Doppler, and power Doppler as needed of all accessible portions of each vessel. Bilateral testing is considered an integral part of a complete examination. Limited examinations for reoccurring indications may be performed as noted. The reflux portion of the exam is performed with the patient in reverse Trendelenburg.  +---------+---------------+---------+-----------+----------+--------------+ RIGHT    CompressibilityPhasicitySpontaneityPropertiesThrombus Aging +---------+---------------+---------+-----------+----------+--------------+ CFV      Full           Yes      Yes                                 +---------+---------------+---------+-----------+----------+--------------+ SFJ      Full                                                        +---------+---------------+---------+-----------+----------+--------------+ FV Prox  Full                                                        +---------+---------------+---------+-----------+----------+--------------+ FV Mid   Full                                                        +---------+---------------+---------+-----------+----------+--------------+ FV DistalFull                                                        +---------+---------------+---------+-----------+----------+--------------+  PFV      Full                                                        +---------+---------------+---------+-----------+----------+--------------+ POP      Full           Yes      Yes                                 +---------+---------------+---------+-----------+----------+--------------+ PTV      Full                                                        +---------+---------------+---------+-----------+----------+--------------+ PERO     Full                                                         +---------+---------------+---------+-----------+----------+--------------+   +---------+---------------+---------+-----------+----------+--------------+ LEFT     CompressibilityPhasicitySpontaneityPropertiesThrombus Aging +---------+---------------+---------+-----------+----------+--------------+ CFV      Full           Yes      Yes                                 +---------+---------------+---------+-----------+----------+--------------+ SFJ      Full                                                        +---------+---------------+---------+-----------+----------+--------------+ FV Prox  Full                                                        +---------+---------------+---------+-----------+----------+--------------+ FV Mid   Full                                                        +---------+---------------+---------+-----------+----------+--------------+ FV DistalFull                                                        +---------+---------------+---------+-----------+----------+--------------+ PFV      Full                                                        +---------+---------------+---------+-----------+----------+--------------+  POP      Full           Yes      Yes                                 +---------+---------------+---------+-----------+----------+--------------+ PTV      Full                                                        +---------+---------------+---------+-----------+----------+--------------+ PERO     Full                                                        +---------+---------------+---------+-----------+----------+--------------+     Summary: RIGHT: - There is no evidence of deep vein thrombosis in the lower extremity.  - No cystic structure found in the popliteal fossa.  LEFT: - There is no evidence of deep vein thrombosis in the lower extremity.  - No cystic  structure found in the popliteal fossa.  *See table(s) above for measurements and observations. Electronically signed by Fabienne Bruns MD on 11/17/2020 at 4:21:49 PM.    Final      I have independently reviewed the above radiologic studies and discussed with the patient   Recent Lab Findings: Lab Results  Component Value Date   WBC 8.2 11/17/2020   HGB 10.1 (L) 11/17/2020   HCT 27.9 (L) 11/17/2020   PLT 53 (L) 11/17/2020   GLUCOSE 73 11/19/2020   ALT 11 11/17/2020   AST 19 11/17/2020   NA 130 (L) 11/19/2020   K 4.1 11/19/2020   CL 103 11/19/2020   CREATININE 1.87 (H) 11/19/2020   BUN 60 (H) 11/19/2020   CO2 17 (L) 11/19/2020   INR 1.4 (H) 11/17/2020   INR 1.4 (H) 11/17/2020      Assessment / Plan:    35 year old female with history of IV drug abuse admitted on 11/15/2020 with MRSA sepsis as well as UTI.  Transthoracic echo obtained on 11/17/2020 demonstrates a 2 x 1 cm vegetation that appears to be on the atrial surface of the tricuspid valve.  She does not have significant tricuspid valve insufficiency.  The remainder of her echocardiographic findings were unremarkable.  Her CT angiogram demonstrates multiple cavitary lesions in both lungs consistent with septic emboli.  She is currently on IV daptomycin and oral doxycycline per ID recommendations.  CT surgery has been consulted regarding possible angio Vac retrieval of the tricuspid valve lesion.  Optimally, we would prefer to have transesophageal echo prior to recommending AngioVAC to accurately define the position of the vegetation.   The critical care service is quite justifiably concerned that she would not tolerate TEE without being intubated given her current tenuous respiratory status.  She is currently on 2Lnc O2 and oxygenating well but remains tachypneic and quite likely could decompensate easily. Dr. Cliffton Asters will review and discuss further work-up and possible AngioVac retrieval of the tricuspid valve vegetation with the  critical care team.  I  spent 25 minutes counseling the patient face to face.   Leary Roca, PA-C  11/19/2020 12:10 PM  Agree with above This is a 35yo female admitted with MRSA bacteremia, and a 2cm tricuspid valve vegetation in the setting of IV drug abuse.  She is currently in the ICU, critically ill with sepsis, and an AKI.  On review of her imaging, the vegetation is broad based, and on the atrial side.  She does not have much tricuspid valve regurgitation.  Given her clinical picture, Angiovac debridement would present a higher risk of prolonged mechanical ventillation, and potentially worsening AKI.  Additionally, her most recent cultures have been negative. Once she recovers from this septic bout, we can consider debridement.  Will follow peripherally.  Iris Tatsch Keane Scrape

## 2020-11-19 NOTE — Evaluation (Signed)
Occupational Therapy Evaluation Patient Details Name: Anne Bautista MRN: [id number] DOB: [birth year] Today's Date: 11/19/2020    History of Present Illness 35 yo admitted 2/14 with sepsis due to MRSA bacteremia in setting of IVDU. Rt shoulder pain s/p aspiration 2/14. PMHx: IVDU, malnutrition, poor dentition   Clinical Impression   Pt PTA: pt living with significant other and independent. Pt with h/o IVD abuse. Pt currently, limited by swelling in BUEs, pain, decreased ability to care for self, decreased cognition and decreased acitivty tolerance. Pt with constant moaning sounds. MinA for supine to sitting EOB. MaxA for returning to supine and scooting to [facility]. Pt difficult to understand due to muffled speech. RR increased to 50s, but decreased to 30s with rest breaks. O2 >90% on [place] and HR <120 BPM with exertion. Pt would benefit from continued OT skilled services. OT following acutely.    Follow Up Recommendations  SNF;Supervision/Assistance - 24 hour    Equipment Recommendations  None recommended by OT    Recommendations for Other Services       Precautions / Restrictions Precautions Precautions: Fall Precaution Comments: right shoulder, left ankle, back pain Restrictions Weight Bearing Restrictions: No      Mobility Bed Mobility Overal bed mobility: Needs Assistance Bed Mobility: Supine to Sit     Supine to sit: Min assist;HOB elevated     General bed mobility comments: HOB 30* for cues to initiate hand placement    Transfers                 General transfer comment: not ready to assess    Balance Overall balance assessment: Needs assistance Sitting-balance support: No upper extremity supported;Feet supported Sitting balance-Leahy Scale: Fair     Standing balance support: Single extremity supported Standing balance-Leahy Scale: Poor Standing balance comment: reliant on trunk and UE support                           ADL either performed or  assessed with clinical judgement   ADL Overall ADL's : Needs assistance/impaired Eating/Feeding: Moderate assistance;With adaptive utensils;Cueing for safety;Bed level Eating/Feeding Details (indicate cue type and reason): bed level self feeding with built up washcloth for holding utensils (pt in contact room); modA for LUE assisting; RUE in too severe of pain due to swelling and R shoulder pain Grooming: Maximal assistance;Bed level   Upper Body Bathing: Moderate assistance;Sitting;Bed level   Lower Body Bathing: Total assistance;Bed level   Upper Body Dressing : Moderate assistance;Sitting;Bed level   Lower Body Dressing: Maximal assistance;Sitting/lateral leans;Bed level   Toilet Transfer: +2 for physical assistance;+2 for safety/equipment;Maximal assistance Toilet Transfer Details (indicate cue type and reason): did not attempt Toileting- Clothing Manipulation and Hygiene: Total assistance;Bed level Toileting - Clothing Manipulation Details (indicate cue type and reason): purewick in     Functional mobility during ADLs: Minimal assistance (for bed mobility to EOB) General ADL Comments: Pt limited by swelling in BUEs, pain, decreased ability to care for self, decreased cognition and decreased acitivty tolerance. Pt with constant moaning sounds. RR increased to 50s, but decreased to 30s with rest breaks.     Vision Baseline Vision/History: No visual deficits Patient Visual Report: No change from baseline Vision Assessment?: Vision impaired- to be further tested in functional context Additional Comments: continue to assess     Perception     Praxis      Pertinent Vitals/Pain Pain Assessment: Faces Faces Pain Scale: Hurts even more Pain Location: R shoulder,  L ankle, back Pain Descriptors / Indicators: Aching;Guarding;Moaning Pain Intervention(s): Monitored during session;Premedicated before session;Repositioned     Hand Dominance Left   Extremity/Trunk Assessment Upper  Extremity Assessment Upper Extremity Assessment: Generalized weakness;RUE deficits/detail;LUE deficits/detail RUE Deficits / Details: pt with very limited movement of bil UE and unable to fully assess due to lack of participation and pain; swelling RUE Coordination: decreased gross motor;decreased fine motor LUE Deficits / Details: pt with very limited movement of bil UE and unable to fully assess due to lack of participation and pain; swelling LUE Coordination: decreased fine motor;decreased gross motor   Lower Extremity Assessment Lower Extremity Assessment: Generalized weakness LLE Deficits / Details: edema and redness of ankle with no active dorsiflexion due to pain   Cervical / Trunk Assessment Cervical / Trunk Assessment: Kyphotic (rounded head, forward shoulders)   Communication Communication Communication: Expressive difficulties (moaning and barely able to pronunciate speech)   Cognition Arousal/Alertness: Awake/alert;Lethargic Behavior During Therapy: Flat affect Overall Cognitive Status: Impaired/Different from baseline Area of Impairment: Following commands;Safety/judgement;Orientation;Problem solving                 Orientation Level: Disoriented to;Situation;Place     Following Commands: Follows one step commands inconsistently;Follows one step commands with increased time Safety/Judgement: Decreased awareness of deficits;Decreased awareness of safety   Problem Solving: Slow processing;Decreased initiation;Requires verbal cues;Requires tactile cues General Comments: Pt mumbling throughout session requring cues for sequencing and increased time. pt unable to problem solve while holding utensil to bring to mouth for self feeding, but able to hold built up icee and flexing neck to icee to eat it without assist in L hand. Pt stating "juice, I want juice."   General Comments  RR increased to 50s, but decreased to 30s with rest breaks. O2 >90% on [place] and HR <120 BPM with  exertion    Exercises     Shoulder Instructions      Home Living Family/patient expects to be discharged to:: Private residence Living Arrangements: Spouse/significant other Available Help at Discharge: Friend(s);Available PRN/intermittently Type of Home: Apartment Home Access: Level entry     Home Layout: One level     Bathroom Shower/Tub: Producer, television/film/video: Standard Bathroom Accessibility: Yes   Home Equipment: None   Additional Comments: taken from PT eval, pt barely making audible/intelligible speech      Prior Functioning/Environment Level of Independence: Independent                 OT Problem List: Decreased strength;Decreased activity tolerance;Impaired balance (sitting and/or standing);Decreased safety awareness;Cardiopulmonary status limiting activity;Pain;Increased edema;Decreased coordination;Decreased range of motion;Impaired UE functional use      OT Treatment/Interventions: Self-care/ADL training;Therapeutic exercise;Energy conservation;Therapeutic activities;Patient/family education;Balance training;Cognitive remediation/compensation    OT Goals(Current goals can be found in the care plan section) Acute Rehab OT Goals Patient Stated Goal: return home OT Goal Formulation: Patient unable to participate in goal setting Time For Goal Achievement: 12/03/20 Potential to Achieve Goals: Good ADL Goals Pt Will Perform Eating: with set-up;with adaptive utensils;sitting Pt Will Perform Grooming: with supervision;sitting Pt Will Perform Upper Body Dressing: with supervision;sitting Pt Will Perform Lower Body Dressing: sit to/from stand;with min guard assist Pt Will Transfer to Toilet: with min guard assist;ambulating Pt/caregiver will Perform Home Exercise Program: Increased strength;Both right and left upper extremity Additional ADL Goal #1: Pt following 1-2 step commands with minimal multimodal cues.  OT Frequency: Min 2X/week   Barriers  to D/C: Inaccessible home environment;Decreased caregiver support  Co-evaluation              AM-PAC OT "6 Clicks" Daily Activity     Outcome Measure Help from another person eating meals?: A Lot Help from another person taking care of personal grooming?: A Lot Help from another person toileting, which includes using toliet, bedpan, or urinal?: Total Help from another person bathing (including washing, rinsing, drying)?: Total Help from another person to put on and taking off regular upper body clothing?: Total Help from another person to put on and taking off regular lower body clothing?: Total 6 Click Score: 8   End of Session Equipment Utilized During Treatment: Oxygen Nurse Communication: Mobility status  Activity Tolerance: Patient limited by fatigue;Patient limited by lethargy Patient left: in bed;with call bell/phone within reach;with bed alarm set  OT Visit Diagnosis: Unsteadiness on feet (R26.81);Muscle weakness (generalized) (M62.81);Other symptoms and signs involving cognitive function;Pain                Time: 1229-1305 OT Time Calculation (min): 36 min Charges:  OT General Charges $OT Visit: 1 Visit OT Evaluation $OT Eval Moderate Complexity: 1 Mod OT Treatments $Self Care/Home Management : 8-22 mins  Flora Lipps, OTR/L Acute Rehabilitation Services Pager: [phone] Office: [phone]   Calie Buttrey C 11/19/2020, 5:40 PM

## 2020-11-19 NOTE — Progress Notes (Signed)
[facility]  R joint aspirate positive for MRSA Discussed with orthopedics They have seen and examined her today and she has normal range of motion  On my exam today she did not have redness, warmth, tenderness of the shoulder Will continue to monitor, ortho still following, for now holding off on surgical intervention  Heber Benson, MD [place] [facility] Pager: [phone] Cell: [phone] If no response, call [phone]

## 2020-11-20 ENCOUNTER — Inpatient Hospital Stay (HOSPITAL_COMMUNITY): Payer: Self-pay

## 2020-11-20 LAB — BLOOD CULTURE ID PANEL (REFLEXED) - BCID2

## 2020-11-20 LAB — CULTURE, BLOOD (SINGLE): Special Requests: ADEQUATE

## 2020-11-20 LAB — BASIC METABOLIC PANEL
Anion gap: 12 (ref 5–15)
BUN: 77 mg/dL — ABNORMAL HIGH (ref 6–20)
CO2: 18 mmol/L — ABNORMAL LOW (ref 22–32)
Calcium: 6.7 mg/dL — ABNORMAL LOW (ref 8.9–10.3)
Chloride: 101 mmol/L (ref 98–111)
Creatinine, Ser: 2.4 mg/dL — ABNORMAL HIGH (ref 0.44–1.00)
GFR, Estimated: 26 mL/min — ABNORMAL LOW (ref >60)
Glucose, Bld: 93 mg/dL (ref 70–99)
Potassium: 4.7 mmol/L (ref 3.5–5.1)
Sodium: 131 mmol/L — ABNORMAL LOW (ref 135–145)

## 2020-11-20 LAB — CBC
HCT: 23 % — ABNORMAL LOW (ref 36.0–46.0)
Hemoglobin: 8.2 g/dL — ABNORMAL LOW (ref 12.0–15.0)
MCH: 27.7 pg (ref 26.0–34.0)
MCHC: 35.7 g/dL (ref 30.0–36.0)
MCV: 77.7 fL — ABNORMAL LOW (ref 80.0–100.0)
Platelets: 67 10*3/uL — ABNORMAL LOW (ref 150–400)
RBC: 2.96 MIL/uL — ABNORMAL LOW (ref 3.87–5.11)
RDW: 16.2 % — ABNORMAL HIGH (ref 11.5–15.5)
WBC: 10 10*3/uL (ref 4.0–10.5)
nRBC: 0 % (ref 0.0–0.2)

## 2020-11-20 LAB — CK: Total CK: 280 U/L — ABNORMAL HIGH (ref 38–234)

## 2020-11-20 MED ORDER — BISACODYL 5 MG PO TBEC
5.0000 mg | DELAYED_RELEASE_TABLET | Freq: Every day | ORAL | Status: DC
  Administered 2020-11-20: 5 mg via ORAL
  Filled 2020-11-20: qty 1

## 2020-11-20 MED ORDER — SENNA 8.6 MG PO TABS
1.0000 | ORAL_TABLET | Freq: Every day | ORAL | Status: DC
  Administered 2020-11-20: 8.6 mg via ORAL
  Filled 2020-11-20: qty 1

## 2020-11-20 MED ORDER — LACTATED RINGERS IV SOLN: INTRAVENOUS | Status: DC

## 2020-11-20 MED ORDER — HYDROMORPHONE HCL 2 MG PO TABS: 1.0000 mg | ORAL_TABLET | Freq: Four times a day (QID) | ORAL | Status: DC | PRN

## 2020-11-20 MED ORDER — HYDROMORPHONE HCL 1 MG/ML IJ SOLN
1.0000 mg | INTRAMUSCULAR | Status: DC | PRN
  Administered 2020-11-20 – 2020-12-09 (×28): 1 mg via INTRAVENOUS
  Filled 2020-11-20 (×29): qty 1

## 2020-11-20 MED ORDER — HEPARIN SODIUM (PORCINE) 5000 UNIT/ML IJ SOLN
5000.0000 [IU] | Freq: Three times a day (TID) | INTRAMUSCULAR | Status: DC
  Administered 2020-11-20 – 2020-12-11 (×60): 5000 [IU] via SUBCUTANEOUS
  Filled 2020-11-20 (×65): qty 1

## 2020-11-20 NOTE — Progress Notes (Signed)
Physical Therapy Treatment Patient Details Name: Anne Bautista MRN: [id number] DOB: Jul 08, 1986 Today's Date: 11/20/2020    History of Present Illness 35 yo admitted 2/14 with sepsis due to MRSA bacteremia in setting of IVDU. Rt shoulder pain s/p aspiration 2/14. PMHx: IVDU, malnutrition, poor dentition    PT Comments    Pt continues moaning with limited eye opening throughout session. Pt able to transition to EOB and to chair with RR 30 and SpO2 94% on [place] with return to 2L end of session at 96%. Pt with continued pain in right shoulder, left ankle and back limiting mobility along with edema of bil UE and LLE. Pt with limited ability to maintain grip and control of items with bil UE pt dropping cup and needing assist to brush teeth. Will continue to follow to progress function and RN aware of mobility/activity performed.  BP 135/63 HR 120    Follow Up Recommendations  SNF;Supervision for mobility/OOB     Equipment Recommendations  Wheelchair (measurements PT);3in1 (PT)    Recommendations for Other Services       Precautions / Restrictions Precautions Precautions: Fall Precaution Comments: right shoulder, left ankle, back pain Restrictions Weight Bearing Restrictions: No    Mobility  Bed Mobility Overal bed mobility: Needs Assistance Bed Mobility: Supine to Sit     Supine to sit: Min assist;HOB elevated     General bed mobility comments: HOB 40 degrees with min assist to shift legs to EOB and cues to elevate trunk, min assist to fully scoot pelvis to EOB with cues for posture as pt with flexed trunk and tendency for left lean    Transfers Overall transfer level: Needs assistance   Transfers: Sit to/from Stand;Stand Pivot Transfers Sit to Stand: Min assist Stand pivot transfers: Mod assist       General transfer comment: cues for sequence with counting to get pt to initiate anterior shift and rise from EOB with pivot toward right bed to chair. Mod assist to shift and  scoot pelvis in chair with increased time. PT unable to achieve full standing with assist due to left ankle pain  Ambulation/Gait             General Gait Details: unable   Stairs             Wheelchair Mobility    Modified Rankin (Stroke Patients Only)       Balance Overall balance assessment: Needs assistance Sitting-balance support: No upper extremity supported;Feet supported Sitting balance-Leahy Scale: Poor Sitting balance - Comments: EOB with UE support, guarding for safety with flexed trunk   Standing balance support: Single extremity supported Standing balance-Leahy Scale: Poor Standing balance comment: reliant on trunk and UE support                            Cognition Arousal/Alertness: Awake/alert;Lethargic Behavior During Therapy: Flat affect;Anxious Overall Cognitive Status: Impaired/Different from baseline Area of Impairment: Following commands;Safety/judgement;Orientation;Problem solving;Attention;Memory                 Orientation Level: Disoriented to;Situation Current Attention Level: Sustained Memory: Decreased short-term memory Following Commands: Follows one step commands inconsistently;Follows one step commands with increased time Safety/Judgement: Decreased awareness of deficits;Decreased awareness of safety   Problem Solving: Slow processing;Decreased initiation;Requires verbal cues;Requires tactile cues General Comments: pt with mumbling and moaning throughout session with decreased ability to understand her at times. Pt with decreased awareness of function but also self limiting due to  pain      Exercises General Exercises - Lower Extremity Long Arc Quad: AAROM;Both;Seated;10 reps Hip Flexion/Marching: AAROM;Both;Seated;10 reps    General Comments        Pertinent Vitals/Pain Pain Score: 6  Pain Location: R shoulder, L ankle, back Pain Descriptors / Indicators: Aching;Guarding;Moaning Pain Intervention(s):  Limited activity within patient's tolerance;Monitored during session;Repositioned    Home Living                      Prior Function            PT Goals (current goals can now be found in the care plan section) Progress towards PT goals: Progressing toward goals    Frequency    Min 3X/week      PT Plan Current plan remains appropriate    Co-evaluation              AM-PAC PT "6 Clicks" Mobility   Outcome Measure  Help needed turning from your back to your side while in a flat bed without using bedrails?: A Little Help needed moving from lying on your back to sitting on the side of a flat bed without using bedrails?: A Little Help needed moving to and from a bed to a chair (including a wheelchair)?: A Lot Help needed standing up from a chair using your arms (e.g., wheelchair or bedside chair)?: A Lot Help needed to walk in hospital room?: Total Help needed climbing 3-5 steps with a railing? : Total 6 Click Score: 12    End of Session Equipment Utilized During Treatment: Gait belt Activity Tolerance: Patient limited by pain Patient left: in chair;with call bell/phone within reach;with chair alarm set Nurse Communication: Mobility status;Precautions PT Visit Diagnosis: Other abnormalities of gait and mobility (R26.89);Difficulty in walking, not elsewhere classified (R26.2);Pain;Muscle weakness (generalized) (M62.81) Pain - Right/Left: Left Pain - part of body: Ankle and joints of foot     Time: 9163-8466 PT Time Calculation (min) (ACUTE ONLY): 35 min  Charges:  $Therapeutic Exercise: 8-22 mins $Therapeutic Activity: 8-22 mins                     Eimy Plaza P, PT Acute Rehabilitation Services Pager: [phone] Office: [phone]    Kathya Wilz B Pablo Stauffer 11/20/2020, 11:23 AM

## 2020-11-20 NOTE — Progress Notes (Signed)
[facility] Infectious Diseases Follow Up Note  Patient Identification: Patient Name: Anne Bautista MRN: [id number] Admit Date: 11/15/2020  3:21 PM Age: 35 y.o.Today's Date: 11/20/2020   Reason for Visit: MRSA bacteremia  Principal Problem:   Severe sepsis due to methicillin resistant Staphylococcus aureus (MRSA) with acute organ dysfunction [facility]) Active Problems:   IV drug abuse (HCC)   Poor dentition   Malnutrition (HCC)   Thrombocytopenia (HCC)   Tobacco dependence   Acute cystitis without hematuria   Antibiotics: Cefepime 2/12-2/13                    Vancomycin 2/12- 2/15                    Daptomycin 2/16-                    Doxycyline 2/16-  Lines/Tubes: PIVs, urinary catheter   Interval Events: afebrle, no leukocytosis, off oxygen, TEE pending, no plans for intervention by CT Sx noted. Rt shoulder aspirate cx growing staph aureus - no plans for intervention by Ortho noted. 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 Xray with cellulitis  IVDU Withdrawals  Thrombocytoenia -  Improving, in the setting of sepsis and endocarditis AKI    Recommendations Continue Daptomycin Continue Doxycline for 5 days for pulmonary coverage Would recommend to get sputum cultures  Monitor CBC, BMP and CPK Defer need of TEE to CTSx  Management of withdrawals per CCM  Rest of the management as per the primary team. Thank you for the consult. Please page with pertinent questions or concerns.  ______________________________________________________________________ Subjective patient seen and examined at the bedside. Still moaning. Complains of diffuse pain.   Vitals BP (!) 140/92 (BP Location: Left Arm)   Pulse (!) 119   Temp 98.4 F (36.9 C) (Oral)   Resp (!) 32   Ht 5\' 7"   (1.702 m)   Wt 77 kg   LMP 11/10/2020   SpO2 94%   BMI 26.59 kg/m      drowsy, withdrawing and moaning  Chest - coarse breath sounds CVS- Normal s1s2, RRR and systolic murmur+ Abdomen - soft Skin - no obvious rashes Extremities - oedematous,  left foot/ankle swelling and tenderness but less erythematous Rt shoulder less erythematous/tender and warm Back - no point tenderness  Pertinent Microbiology Results for orders placed or performed during the hospital encounter of 11/15/20  Resp Panel by RT-PCR (Flu A&B, Covid) Peripheral     Status: None   Collection Time: 11/15/20  4:10 PM   Specimen: Peripheral; Nasopharyngeal(NP) swabs in vial transport medium  Result Value Ref Range Status   SARS Coronavirus 2 by RT PCR NEGATIVE NEGATIVE Final    Comment: (NOTE) SARS-CoV-2 target nucleic acids are NOT DETECTED.  The SARS-CoV-2 RNA is generally detectable in upper respiratory specimens during the acute phase of infection. The lowest concentration of SARS-CoV-2 viral copies this assay can detect is 138 copies/mL. A negative result does not preclude SARS-Cov-2 infection and should not be used as the sole basis for treatment or other patient management decisions. A negative result may occur with  improper specimen collection/handling, submission of specimen other than nasopharyngeal swab, presence of viral mutation(s) within the areas targeted by this assay, and inadequate number of viral copies(<138 copies/mL). A negative result must be combined with  clinical observations, patient history, and epidemiological information. The expected result is Negative.  Fact Sheet for Patients:  BloggerCourse.comhttps://www.fda.gov/media/152166/download  Fact Sheet for Healthcare Providers:  SeriousBroker.ithttps://www.fda.gov/media/152162/download  This test is no t yet approved or cleared by the [place] FDA and  has been authorized for detection and/or diagnosis of SARS-CoV-2 by FDA under an Emergency Use  Authorization (EUA). This EUA will remain  in effect (meaning this test can be used) for the duration of the COVID-19 declaration under Section 564(b)(1) of the Act, 21 U.S.C.section 360bbb-3(b)(1), unless the authorization is terminated  or revoked sooner.       Influenza A by PCR NEGATIVE NEGATIVE Final   Influenza B by PCR NEGATIVE NEGATIVE Final    Comment: (NOTE) The Xpert Xpress SARS-CoV-2/FLU/RSV plus assay is intended as an aid in the diagnosis of influenza from Nasopharyngeal swab specimens and should not be used as a sole basis for treatment. Nasal washings and aspirates are unacceptable for Xpert Xpress SARS-CoV-2/FLU/RSV testing.  Fact Sheet for Patients: BloggerCourse.comhttps://www.fda.gov/media/152166/download  Fact Sheet for Healthcare Providers: SeriousBroker.ithttps://www.fda.gov/media/152162/download  This test is not yet approved or cleared by the [place] FDA and has been authorized for detection and/or diagnosis of SARS-CoV-2 by FDA under an Emergency Use Authorization (EUA). This EUA will remain in effect (meaning this test can be used) for the duration of the COVID-19 declaration under Section 564(b)(1) of the Act, 21 U.S.C. section 360bbb-3(b)(1), unless the authorization is terminated or revoked.  Performed at [facility] [place], [address] [place], [place] [address]   Blood culture (routine x 2)     Status: Abnormal   Collection Time: 11/15/20  4:10 PM   Specimen: BLOOD  Result Value Ref Range Status   Specimen Description   Final    BLOOD LEFT ANTECUBITAL Performed at [facility] [place], [address] [place], [place] [address]    Special Requests   Final    BOTTLES DRAWN AEROBIC AND ANAEROBIC Blood Culture adequate volume Performed at [facility] [place], [address] [place], [place] [address]    Culture  Setup Time   Final    GRAM POSITIVE COCCI IN BOTH AEROBIC AND ANAEROBIC BOTTLES CRITICAL RESULT CALLED TO, READ BACK BY AND VERIFIED  WITH: CINDY REED,RN @0956  11/16/20 EB Performed at [facility], [address] [place], [place] [address]    Culture METHICILLIN RESISTANT STAPHYLOCOCCUS AUREUS (A)  Final   Report Status 11/18/2020 FINAL  Final   Organism ID, Bacteria METHICILLIN RESISTANT STAPHYLOCOCCUS AUREUS  Final      Susceptibility   Methicillin resistant staphylococcus aureus - MIC*    CIPROFLOXACIN <=0.5 SENSITIVE Sensitive     ERYTHROMYCIN >=8 RESISTANT Resistant     GENTAMICIN <=0.5 SENSITIVE Sensitive     OXACILLIN >=4 RESISTANT Resistant     TETRACYCLINE <=1 SENSITIVE Sensitive     VANCOMYCIN 1 SENSITIVE Sensitive     TRIMETH/SULFA <=10 SENSITIVE Sensitive     CLINDAMYCIN <=0.25 SENSITIVE Sensitive     RIFAMPIN <=0.5 SENSITIVE Sensitive     Inducible Clindamycin NEGATIVE Sensitive     * METHICILLIN RESISTANT STAPHYLOCOCCUS AUREUS  Blood Culture ID Panel (Reflexed)     Status: Abnormal   Collection Time: 11/15/20  4:10 PM  Result Value Ref Range Status   Enterococcus faecalis NOT DETECTED NOT DETECTED Final   Enterococcus Faecium NOT DETECTED NOT DETECTED Final   Listeria monocytogenes NOT DETECTED NOT DETECTED Final   Staphylococcus species DETECTED (A) NOT  DETECTED Final    Comment: CRITICAL RESULT CALLED TO, READ BACK BY AND VERIFIED WITH: CINDY REED,RN @0956  11/16/20 EB    Staphylococcus aureus (BCID) DETECTED (A) NOT DETECTED Final    Comment: Methicillin (oxacillin)-resistant Staphylococcus aureus (MRSA). MRSA is predictably resistant to beta-lactam antibiotics (except ceftaroline). Preferred therapy is vancomycin unless clinically contraindicated. Patient requires contact precautions if  hospitalized. CRITICAL RESULT CALLED TO, READ BACK BY AND VERIFIED WITH: CINDY REED,RN @0956  11/16/20 EB    Staphylococcus epidermidis NOT DETECTED NOT DETECTED Final   Staphylococcus lugdunensis NOT DETECTED NOT DETECTED Final   Streptococcus species NOT DETECTED NOT DETECTED Final   Streptococcus  agalactiae NOT DETECTED NOT DETECTED Final   Streptococcus pneumoniae NOT DETECTED NOT DETECTED Final   Streptococcus pyogenes NOT DETECTED NOT DETECTED Final   A.calcoaceticus-baumannii NOT DETECTED NOT DETECTED Final   Bacteroides fragilis NOT DETECTED NOT DETECTED Final   Enterobacterales NOT DETECTED NOT DETECTED Final   Enterobacter cloacae complex NOT DETECTED NOT DETECTED Final   Escherichia coli NOT DETECTED NOT DETECTED Final   Klebsiella aerogenes NOT DETECTED NOT DETECTED Final   Klebsiella oxytoca NOT DETECTED NOT DETECTED Final   Klebsiella pneumoniae NOT DETECTED NOT DETECTED Final   Proteus species NOT DETECTED NOT DETECTED Final   Salmonella species NOT DETECTED NOT DETECTED Final   Serratia marcescens NOT DETECTED NOT DETECTED Final   Haemophilus influenzae NOT DETECTED NOT DETECTED Final   Neisseria meningitidis NOT DETECTED NOT DETECTED Final   Pseudomonas aeruginosa NOT DETECTED NOT DETECTED Final   Stenotrophomonas maltophilia NOT DETECTED NOT DETECTED Final   Candida albicans NOT DETECTED NOT DETECTED Final   Candida auris NOT DETECTED NOT DETECTED Final   Candida glabrata NOT DETECTED NOT DETECTED Final   Candida krusei NOT DETECTED NOT DETECTED Final   Candida parapsilosis NOT DETECTED NOT DETECTED Final   Candida tropicalis NOT DETECTED NOT DETECTED Final   Cryptococcus neoformans/gattii NOT DETECTED NOT DETECTED Final   Meth resistant mecA/C and MREJ DETECTED (A) NOT DETECTED Final    Comment: CRITICAL RESULT CALLED TO, READ BACK BY AND VERIFIED WITH: CINDY REED,RN @0956  11/16/20 EB Performed at [facility], [address] [place], [facility] [address]   Blood culture (routine x 2)     Status: Abnormal   Collection Time: 11/15/20  4:25 PM   Specimen: BLOOD  Result Value Ref Range Status   Specimen Description   Final    BLOOD LEFT ANTECUBITAL Performed at [facility], [address] [place], [facility] [address]    Special Requests   Final     BOTTLES DRAWN AEROBIC ONLY Blood Culture results may not be optimal due to an inadequate volume of blood received in culture bottles Performed at [facility], [address] [place], [facility] [address]    Culture  Setup Time   Final    GRAM POSITIVE COCCI IN CLUSTERS AEROBIC BOTTLE ONLY CRITICAL VALUE NOTED.  VALUE IS CONSISTENT WITH PREVIOUSLY REPORTED AND CALLED VALUE.    Culture (A)  Final    STAPHYLOCOCCUS AUREUS SUSCEPTIBILITIES PERFORMED ON PREVIOUS CULTURE WITHIN THE LAST 5 DAYS. Performed at [facility], [address] [place], [facility] [address]    Report Status 11/18/2020 FINAL  Final  Urine culture     Status: Abnormal   Collection Time: 11/15/20  7:47 PM   Specimen: Urine, Catheterized  Result Value Ref Range Status   Specimen Description   Final    URINE, CATHETERIZED  Performed at [facility], [address] [place], [place] [address]    Special Requests   Final    Normal Performed at [facility], [address] [place], [place] [address]    Culture (A)  Final    >=100,000 COLONIES/mL METHICILLIN RESISTANT STAPHYLOCOCCUS AUREUS   Report Status 11/18/2020 FINAL  Final   Organism ID, Bacteria METHICILLIN RESISTANT STAPHYLOCOCCUS AUREUS (A)  Final      Susceptibility   Methicillin resistant staphylococcus aureus - MIC*    CIPROFLOXACIN <=0.5 SENSITIVE Sensitive     GENTAMICIN <=0.5 SENSITIVE Sensitive     NITROFURANTOIN <=16 SENSITIVE Sensitive     OXACILLIN >=4 RESISTANT Resistant     TETRACYCLINE <=1 SENSITIVE Sensitive     VANCOMYCIN <=0.5 SENSITIVE Sensitive     TRIMETH/SULFA <=10 SENSITIVE Sensitive     CLINDAMYCIN <=0.25 SENSITIVE Sensitive     RIFAMPIN <=0.5 SENSITIVE Sensitive     Inducible Clindamycin NEGATIVE Sensitive     * >=100,000 COLONIES/mL METHICILLIN RESISTANT STAPHYLOCOCCUS AUREUS  Culture, blood (single)     Status: Abnormal   Collection Time: 11/15/20  9:35 PM   Specimen: BLOOD RIGHT HAND   Result Value Ref Range Status   Specimen Description   Final    BLOOD RIGHT HAND Performed at [facility], [address] [address] [facility] [address] [facility] [address] [place], [place] [address]    Special Requests   Final    BOTTLES DRAWN AEROBIC AND ANAEROBIC Blood Culture adequate volume Performed at [facility], [address] [place], [place] [address]    Culture  Setup Time   Final    GRAM POSITIVE COCCI IN CLUSTERS IN BOTH AEROBIC AND ANAEROBIC BOTTLES CRITICAL VALUE NOTED.  VALUE IS CONSISTENT WITH PREVIOUSLY REPORTED AND CALLED VALUE.    Culture (A)  Final    STAPHYLOCOCCUS AUREUS SUSCEPTIBILITIES PERFORMED ON PREVIOUS CULTURE WITHIN THE LAST 5 DAYS. Performed at [facility], [address] [place], [place] [address]    Report Status 11/20/2020 FINAL  Final  Culture, blood (routine x 2)     Status: None (Preliminary result)   Collection Time: 11/17/20  9:28 AM   Specimen: BLOOD RIGHT FOREARM  Result Value Ref Range Status   Specimen Description BLOOD RIGHT FOREARM  Final   Special Requests   Final    BOTTLES DRAWN AEROBIC AND ANAEROBIC Blood Culture results may not be optimal due to an inadequate volume of blood received in culture bottles   Culture   Final    NO GROWTH 3 DAYS Performed at [facility], [address], [place], [place] [address]    Report Status PENDING  Incomplete  Culture, blood (routine x 2)     Status: None (Preliminary result)   Collection Time: 11/17/20  9:39 AM   Specimen: BLOOD RIGHT HAND  Result Value Ref Range Status   Specimen Description BLOOD RIGHT HAND  Final   Special Requests   Final    BOTTLES DRAWN AEROBIC AND ANAEROBIC Blood Culture results may not be optimal due to an inadequate volume of blood received in culture bottles   Culture   Final    NO GROWTH 3 DAYS Performed at [facility], [address], [place], [place] [address]    Report Status PENDING  Incomplete  MRSA PCR Screening     Status: Abnormal   Collection Time:  11/17/20 11:38 AM   Specimen: Nasopharyngeal  Result Value Ref Range  Status   MRSA by PCR POSITIVE (A) NEGATIVE Final    Comment:        The GeneXpert MRSA Assay (FDA approved for NASAL specimens only), is one component of a comprehensive MRSA colonization surveillance program. It is not intended to diagnose MRSA infection nor to guide or monitor treatment for MRSA infections. RESULT CALLED TO, READ BACK BY AND VERIFIED WITH: R ROBERTS RN 1400 11/17/20 A BROWNING Performed at [facility], [address] [place], [place] [address]   Body fluid culture w Gram Stain     Status: None (Preliminary result)   Collection Time: 11/17/20  4:05 PM   Specimen: Synovium; Body Fluid  Result Value Ref Range Status   Specimen Description SYNOVIAL RIGHT SHOULDER  Final   Special Requests NONE  Final   Gram Stain   Final    ABUNDANT WBC PRESENT, PREDOMINANTLY PMN NO ORGANISMS SEEN    Culture   Final    RARE STAPHYLOCOCCUS AUREUS SUSCEPTIBILITIES TO FOLLOW CRITICAL RESULT CALLED TO, READ BACK BY AND VERIFIED WITH: RN T.SHELTON AT 1211 ON 11/19/2020 BY T.SAAD Performed at [facility], [address], [place], [place] [address]    Report Status PENDING  Incomplete    Pertinent Lab. CBC Latest Ref Rng & Units 11/20/2020 11/17/2020 11/17/2020  WBC 4.0 - 10.5 K/uL 10.0 - 8.2  Hemoglobin 12.0 - 15.0 g/dL 8.2(L) - 10.1(L)  Hematocrit 36.0 - 46.0 % 23.0(L) - 27.9(L)  Platelets 150 - 400 K/uL 67(L) 53(L) 40(L)   CMP Latest Ref Rng & Units 11/20/2020 11/19/2020 11/18/2020  Glucose 70 - 99 mg/dL 93 73 99  BUN 6 - 20 mg/dL 46(T) 03(T) 46(F)  Creatinine 0.44 - 1.00 mg/dL 6.81(E) 7.51(Z) 0.01(V)  Sodium 135 - 145 mmol/L 131(L) 130(L) 130(L)  Potassium 3.5 - 5.1 mmol/L 4.7 4.1 3.6  Chloride 98 - 111 mmol/L 101 103 103  CO2 22 - 32 mmol/L 18(L) 17(L) 20(L)  Calcium 8.9 - 10.3 mg/dL 6.7(L) 7.2(L) 7.3(L)  Total Protein 6.5 - 8.1 g/dL - - -  Total Bilirubin 0.3 - 1.2 mg/dL - - -  Alkaline  Phos 38 - 126 U/L - - -  AST 15 - 41 U/L - - -  ALT 0 - 44 U/L - - -     Pertinent Imaging today Plain films and CT images have been personally visualized and interpreted; radiology reports have been reviewed. Decision making incorporated into the Impression / Recommendations.  I have spent approx 30 minutes for this patient encounter including review of prior medical records with greater than 50% of time being face to face and coordination of their care.  Electronically signed by:   Odette Fraction, MD Infectious Disease Physician [facility] Pager: [phone]

## 2020-11-20 NOTE — Progress Notes (Addendum)
NAME:  Anne Bautista, MRN:  [id number], DOB:  October 18, 1985, LOS: 4 ADMISSION DATE:  11/15/2020, CONSULTATION DATE:  02.14.2022 REFERRING MD:  Sunnie Nielsen, CHIEF COMPLAINT:  Sepsis  Brief History:  35yo female presented with cough and myalgias, found to be bacteremic with known history of IVDU  History of Present Illness:  Anne Bautista is a 35yo female with PMH significant for IVDU, tobacco abuse, and malnutrition who presented to the ED with complaints of cough and [name].  She reported "everything hurts".  Patient reports the symptoms have been present for approximately 1 week prior to admission.  Also reports fever upwards of 101.5, nonproductive cough and generalized weakness with ambulatory dysfunction.  Patient reports long running history of IV drug abuse with recent relapse 3 days prior to admission.  She does report prior history of endocarditis with hospitalization approximately 4 years ago where she left AGAINST MEDICAL ADVICE.  On arrival patient was seen afebrile, tachycardic, and tachypneic.  Significant lab work included: Sodium 124, potassium 2.6, glucose 128, BUN 40, creatinine 1.33, albumin 2.1 lactic acid 3.6, WBC 15.3, platelets 51.  Toxicology positive for cocaine urinalysis positive for trace leukocytes and nitrates.  Admission chest x-ray significant for bilateral lung opacities likely consistent with residual scarring from pulmonary emboli.  CT abdomen and pelvis consistent with multiple subtle hypoenhancing lesions of the renal cortical consistent with septic emboli and splenomegaly.  CTA chest negative for PE but revealed numerous bilateral cavitary nodules throughout the lungs most consistent with septic emboli.  Patient was initially admitted to hospitalist team for further management and treatment of bacteremia.  However morning of 2/14 patient began developing worsening signs of severe sepsis including fever with T-max of 100.2, tachycardia with a heart rate of 140, tachypnea  with respiratory rates in the upper 40s to 50s.  Blood cultures positive for Staph aureus. PCCM was consulted for further management and patient transferred to the ICU on 02.14.2022.  Past Medical History:  IV drug abuse Tobacco abuse Malnutrition Endocarditis  Significant Hospital Events:  2/13 Admitted  Consults:  Infectious Disease Cardiothoracic Surgery  Procedures:  2/14 Right shoulder aspiration   Significant Diagnostic Tests:  Covid 2/12 > negative Blood cultures > positive for staph aureus Urine culture 2/12 > positive staph aureus  CT abd/ pelvis 2/12> mult. Hypo-enhancing lesions renal cortices, suspect septic emboli; splenomegaly CT angio chest 2/12> no PE; bilateral, numerous cavitary nodules, likely septic emboli vs vasculitis vs metastatic disease; pleural effusions, trace right, small left CT head w/o contrast 2/12> brain appears normal; right maxillary sinus, possible rhinosinusitis  ECHO 2/14> positive for vegetation on tricuspid valve LE Dopplers 2/14> negative for DVT bilaterally R Shoulder Joint Aspiration>  Rare Staph aureus, replating  Micro Data:  Covid 2/12 > negative Blood cultures > positive for Staph aureus Urine culture 2/12 > positive Staph aureus R Shoulder aspiration> positive Staph aureus   Antimicrobials:  Cefepime 2/12 >2/13 Flagyl 2/13 >2/13 Vancomycin 2/12 >>2/16 Daptomycin 2/16>> Doxycycline 2/16>>   Interim History / Subjective:  Patient reports getting some sleep overnight; pain is generalized, although she indicates her stomach hurts when pressed. No BM since prior to admission. Right shoulder, left ankle and back are issues. Receiving opioid pain medication, anxiolytics.   Objective   Blood pressure 137/62, pulse (!) 117, temperature 98.3 F (36.8 C), temperature source Oral, resp. rate (!) 50, height 5\' 7"  (1.702 m), weight 77 kg, last menstrual period 11/10/2020, SpO2 93 %.        Intake/Output Summary (Last  24 hours)  at 11/20/2020 4098 Last data filed at 11/20/2020 0600 Gross per 24 hour  Intake 1766.55 ml  Output --  Net 1766.55 ml   Filed Weights   11/16/20 1147 11/17/20 0515 11/17/20 1100  Weight: 71.5 kg 78 kg 77 kg    Examination: General: Uncomfortable-appearing young woman in bed with moderate acute anxiety, keening; HENT: PERRL, poor dentition, dry lips Lungs: scattered rhonchi, more pronounced on the left than the right, elevated respiratory rate, cough with secretions Cardiovascular: Regular rhythm but tachcardic, no appreciable murmur, otherwise NSR on the monitor; trace edema in upper extremities, trace edema RLE, but edema to LLE (ankle); radial and dorsalis pedal pulses 2+/3+. Cap refill <3 seconds Abdomen: Soft, tender, slightly distended, hypo-active bowel sounds Extremities:adequate ROM, except the left ankle. Warm and dry. Redness and increased warmth to LLE/ ankle, tracking up the limb, when compared to right. Marked this AM.  Neuro: Alert and oriented grossly to person, place, and situation, follows commands, better answers this am GU: purewick in place, incontinent around it Psych: anxiety, pain with exam,   Resolved Hospital Problem list     Assessment & Plan:  Severe epsis with MRSA bacteremia in the setting of IV drug use Endocarditis (hx of endocarditis w/ sub-optimal abx regimen (4 weeks, not 6)) Septic emboli to kidneys and lungs Fevers, intermittent tachycardia and tachypnea, leukocytosis; positive blood cultures for Staph aureus. ECHO positive for vegetation. LE DVT negative bilaterally. CT evaluated for possible angiovac; prefer to wait until infection is under control given tenuous nature of patient. -Vancomycin stopped on 2/16; Vanc trough elevated on 2/16 at 50 -Daptomycin/ Doxycycline initated on 2/16 -Fluids given; 8.4L up since 2/12  -Chest X-ray 2/17, concern for volume overload -MAP goal >65, currently at 113 (2/17) -trended lactic acid (2/15 2.1 and  decreasing, Lactic acid level on 2/18) -monitor I/Os -O2 via [place] to keep sats > 90  IV drug use, tobacco use, cocaine use, marijuana use Currently getting pain medication including opioid analgesics. When we wean, monitor for withdrawal. Patient reports recent two year hx of sobriety, recent relapse. Hospitalized with endocarditis 4 yrs ago, left during treatment AMA. -IV dilaudid, IV ativan PRN -Scheduled PO Oxy IR, Scheduled PO Dilaudid -Seroquel 25mg  BID, could increase by 50mg  if needed -EKG 2/18 to monitor QTc given new atypical antipsychotic use -will need to plan detox with suboxone in the future -Bowel regimen (consitpation-see below) -nicotine replacement therapy  Acute Thrombocytopenia  Acute Anemia  Possibly due to sepsis. Spleen is enlarged on CT on 2/12. Anemia from poor nutrition vs dilutional. Chest Ct neg for PE. No current signs/ symptoms of bleeding. Hgb and Hct decreased from admit on 2/12 (13.0/36.4) to (8.2/23.0) on 2/17. Questionable dilutional vs sequelae of the sepsis. -DIC panel (PT 16.6, INR 1.4, aPTT 31, FIB 452, ADV D-DIMER 4.17, PLT 53, No schistocytosis) unremarkable. -CBC 2/17-PLTs of 67; hold on starting SubQ heparin for DVT prophylaxis -monitor CBC, signs of bleeding  AKI Larger bump in BUN/Creat on on 2/17 of 77/ 2.40 from a bun/creat on 2/16 of 60/1.87.Creat rising beyond admit level (1.33). Vancomycin dosing vs. Septic emboli in the renal cortices. -Vanc stopped 2/16 -IV fluids gentle hydration; chest xray as above -trend BUN/ creat -avoid nephrotoxic medications  Hyponatremia/ Hypokalemia Likely secondary to volume changes and poor nutrition -Monitor trends -replete K+ as needed -Telemetry  Concern for septic arthritis Right shoulder R shoulder aspirated, fluid evaluated. Septic joint possible even with WBCs in the 8K range, gram stain  abundant WBCs, few neutrophils, grew staph. Possibly inflammatory vs reactive as well.  -GS to evaluate for  washout, cultures positive for staph, ortho will follow but not planning on a clean out at this time -IV daptomycin, PO doxycycline -pain management  Concern for cellulitis on Left ankle Ortho evaluated, feels LLE is more likely to be cellulitis. Marked this AM to further evaluate. Concern for ongoing, increasing redness, warm, and pain. -LLE X-ray -Contact ortho for additional recommendations -pain management  Acute on chronic constipation Very limited movement, chronic opioid use and current opioid use. Poor PO intake. No BM this admit and patient unable to recall last BM. -Senna, Miramlax, colace, dulcolax. -Up to chair/ BSC  Malnutrition Multiple likely etiologies including poor dentition, IVDU Currently Clear Liquid Diet, plan to advance to mechanical soft when patient is eating more -Dietician consult -PO meal supplementation (Ensure)  Best practice (evaluated daily)  Diet: Clears, then supplemented and advanced Pain/Anxiety/Delirium protocol (if indicated): Dilaudid, Oxy IR, Ativan VAP protocol (if indicated): NA DVT prophylaxis: Held (low platelets) GI prophylaxis: NA Glucose control: NA Mobility: Bed Disposition:ICU  Note written by Rosine DoorAngus Makalya Nave, MS4 under the guidance of R. Byrum, MD PCCM  Goals of Care:  Last date of multidisciplinary goals of care discussion: Family and staff present:  Summary of discussion:  Follow up goals of care discussion due:  Code Status: FULL  Labs   CBC: Recent Labs  Lab 11/15/20 1337 11/17/20 0234 11/17/20 1019  WBC 15.3* 8.2  --   HGB 13.0 10.1*  --   HCT 36.4 27.9*  --   MCV 77.1* 77.5*  --   PLT 51* 40* 53*    Basic Metabolic Panel: Recent Labs  Lab 11/15/20 1735 11/16/20 1037 11/16/20 1930 11/17/20 0234 11/18/20 0147 11/19/20 0241 11/20/20 0253  NA 124* 130* 128* 129* 130* 130* 131*  K 2.6* 2.5* 4.2 3.9 3.6 4.1 4.7  CL 90* 103 101 102 103 103 101  CO2 19* 16* 18* 19* 20* 17* 18*  GLUCOSE 128* 119* 131*  104* 99 73 93  BUN 40* 25* 22* 22* 31* 60* 77*  CREATININE 1.33* 0.84 0.92 0.84 1.07* 1.87* 2.40*  CALCIUM 7.7* 7.0* 7.4* 7.4* 7.3* 7.2* 6.7*  MG 1.8 1.9  --   --   --   --   --    GFR: Estimated Creatinine Clearance: 35 mL/min (A) (by C-G formula based on SCr of 2.4 mg/dL (H)). Recent Labs  Lab 11/15/20 1337 11/15/20 1845 11/16/20 1930 11/16/20 2234 11/17/20 0234 11/17/20 1019 11/17/20 1150  PROCALCITON  --   --  1.97  --   --   --   --   WBC 15.3*  --   --   --  8.2  --   --   LATICACIDVEN  --    < > 3.6* 3.1*  --  2.7* 2.1*   < > = values in this interval not displayed.    Liver Function Tests: Recent Labs  Lab 11/15/20 1735 11/16/20 1037 11/17/20 1150  AST 23 27 19   ALT 12 11 11   ALKPHOS 74 71 73  BILITOT 0.9 1.7* 1.5*  PROT 6.5 5.7* 5.5*  ALBUMIN 2.1* 1.7* 1.3*   Recent Labs  Lab 11/15/20 1735  LIPASE 20   No results for input(s): AMMONIA in the last 168 hours.  ABG No results found for: PHART, PCO2ART, PO2ART, HCO3, TCO2, ACIDBASEDEF, O2SAT   Coagulation Profile: Recent Labs  Lab 11/17/20 1019  INR 1.4*  1.4*    Cardiac Enzymes: Recent Labs  Lab 11/15/20 1735 11/20/20 0253  CKTOTAL 43 280*    HbA1C: No results found for: HGBA1C  CBG: Recent Labs  Lab 11/15/20 1535 11/19/20 0710 11/19/20 0835  GLUCAP 145* 66* 78    Review of Systems:   Negative unless otherwise indicated.  Past Medical History:  She,  has a past medical history of IV drug user, Malnutrition (HCC) (11/16/2020), Poor dentition (11/16/2020), and Tobacco dependence (11/16/2020).   Surgical History:  History reviewed. No pertinent surgical history.   Social History:   reports that she has been smoking cigarettes. She has a 18.00 pack-year smoking history. She has never used smokeless tobacco. She reports current drug use. Drugs: Cocaine and Marijuana. She reports that she does not drink alcohol.   Family History:  Her family history is not on file.   Allergies No  Known Allergies   Home Medications  Prior to Admission medications   Not on File     Critical care time:

## 2020-11-20 NOTE — Progress Notes (Signed)
CCM Progress Note:  [name], MS4, spoke with the [name] friend [name]) after receiving verbal consent from [name].  Per the conversation, Anne Bautista is estranged from her family, but has given [name] permission to contact her father and her aunt regarding this hospitalization "if it is bad." We talked about her general medical condition, the fact that she is in the ICU, and that she has a sizeable journey going forward towards being healthy and back to herself again. I encouraged [name] to talk to [name] soon and determine if she wants her family to know how things are going here in the hospital.   In addition, we reviewed the events that led up to [name]'s hospitalization, including use of medications like ibuprofen and trazodone (from a prior diagnosis of depression?) to manage her cough, her intense body pain, and her temps prior to coming to the hospital. We discussed her drug use, which includes about $60 dollars a day's worth of heroin. She also uses crack cocaine, and, on occasion, methamphetamine. [name], with whom she lives, reports daily use of heroin recently, including in the two to three days before her admission. He intimated (but did not disclose actual details) that the patient has a history of trauma. Her behavior as of late has been erratic, and she has been out of work for more than a year.   Her immobility, as referenced in the brief history, is recent (3 to 4 days prior to admission). Her constipation, particularly while using opioids, is pronounced--sometimes going a week between bowel movements. [name] was unsure about her ankle (whether there was an inciting trauma) but it and the shoulder pain are recent to this illness.  There may be a history of depression and anxiety, but he was unable to remember the other medication she had for these conditions--it sounds, though, like she had not been taking them for awhile.  Long term, [name] will need treatment for   Her drug addiction, along with any other co-morbid conditions like depression and anxiety. Short term, she needs adequate pain control and symptom management as she receives treatment for her ongoing infection.  [name], MS4.

## 2020-11-20 NOTE — Progress Notes (Signed)
PHARMACY - PHYSICIAN COMMUNICATION CRITICAL VALUE ALERT - BLOOD CULTURE IDENTIFICATION (BCID)  Anne Bautista is an 35 y.o. female who presented to [facility] on 11/15/2020  Assessment:  73 yof with MRSA bacteremia/TV endocarditis with septic pulmonary emboli/renal emboli, R shoulder septic arthritis. Repeat BCx have 1 of 4 bottles resulted as MRSA.  Name of physician (or Provider) Contacted: Byrum, R  Current antibiotics: Daptomycin 10mg /kg IV q24h + doxy x 5 days for pulmonary coverage per ID  Changes to prescribed antibiotics recommended:  Patient is on recommended antibiotics - No changes needed  Results for orders placed or performed during the hospital encounter of 11/15/20  Blood Culture ID Panel (Reflexed) (Collected: 11/15/2020  4:10 PM)  Result Value Ref Range   Enterococcus faecalis NOT DETECTED NOT DETECTED   Enterococcus Faecium NOT DETECTED NOT DETECTED   Listeria monocytogenes NOT DETECTED NOT DETECTED   Staphylococcus species DETECTED (A) NOT DETECTED   Staphylococcus aureus (BCID) DETECTED (A) NOT DETECTED   Staphylococcus epidermidis NOT DETECTED NOT DETECTED   Staphylococcus lugdunensis NOT DETECTED NOT DETECTED   Streptococcus species NOT DETECTED NOT DETECTED   Streptococcus agalactiae NOT DETECTED NOT DETECTED   Streptococcus pneumoniae NOT DETECTED NOT DETECTED   Streptococcus pyogenes NOT DETECTED NOT DETECTED   A.calcoaceticus-baumannii NOT DETECTED NOT DETECTED   Bacteroides fragilis NOT DETECTED NOT DETECTED   Enterobacterales NOT DETECTED NOT DETECTED   Enterobacter cloacae complex NOT DETECTED NOT DETECTED   Escherichia coli NOT DETECTED NOT DETECTED   Klebsiella aerogenes NOT DETECTED NOT DETECTED   Klebsiella oxytoca NOT DETECTED NOT DETECTED   Klebsiella pneumoniae NOT DETECTED NOT DETECTED   Proteus species NOT DETECTED NOT DETECTED   Salmonella species NOT DETECTED NOT DETECTED   Serratia marcescens NOT DETECTED NOT DETECTED   Haemophilus  influenzae NOT DETECTED NOT DETECTED   Neisseria meningitidis NOT DETECTED NOT DETECTED   Pseudomonas aeruginosa NOT DETECTED NOT DETECTED   Stenotrophomonas maltophilia NOT DETECTED NOT DETECTED   Candida albicans NOT DETECTED NOT DETECTED   Candida auris NOT DETECTED NOT DETECTED   Candida glabrata NOT DETECTED NOT DETECTED   Candida krusei NOT DETECTED NOT DETECTED   Candida parapsilosis NOT DETECTED NOT DETECTED   Candida tropicalis NOT DETECTED NOT DETECTED   Cryptococcus neoformans/gattii NOT DETECTED NOT DETECTED   Meth resistant mecA/C and MREJ DETECTED (A) NOT DETECTED    01/13/2021, PharmD, BCPS Please check AMION for all [facility] contact numbers Clinical Pharmacist 11/20/2020 6:42 PM

## 2020-11-20 NOTE — Progress Notes (Addendum)
Nutrition Follow-up  DOCUMENTATION CODES:   Non-severe (moderate) malnutrition in context of social or environmental circumstances  INTERVENTION:    Diet advancement per MD as alertness improves  Continue Ensure Enlive po TID, each supplement provides 350 kcal and 20 grams of protein  Continue MVI with minerals daily  NUTRITION DIAGNOSIS:   Moderate Malnutrition related to social / environmental circumstances (IVDU) as evidenced by mild fat depletion,moderate fat depletion,mild muscle depletion,moderate muscle depletion.  Ongoing   GOAL:   Patient will meet greater than or equal to 90% of their needs   Progressing  MONITOR:   PO intake,Supplement acceptance,Labs,Weight trends,Skin,I & O's  REASON FOR ASSESSMENT:   Consult Assessment of nutrition requirement/status  ASSESSMENT:   Anne Bautista is a 35 y.o. female with medical history significant of IVDA presenting with cough and myalgias.  Everything hurts so bad.  She started with a cyst in her armpit that went away and then she had pain in her shoulder and it went all over.  Her R leg and L foot, ribs, stomach all hurting.  +SOB.  This has been going on for about a week and a half.  +fever up to 105.5.  +cough, nonproductive. She is unable to stand or walk currently.  ECHO on 2/14 positive for vegetation on tricuspid valve. Holding off on surgical intervention at present. S/P R shoulder aspiration and injection 2/14.  She was made NPO on 2/14 and has been NPO or on clear liquids since then. Intake is poor due to pain and decreased alertness r/t pain medications.  Remains on clear liquids with plans to advance diet when patient is more alert and eating more. RN gave an Ensure supplement this morning and patient has drank ~ half of it. She is eating the jello from her lunch tray currently. Will continue Ensure supplements even though patient is on clear liquids since she has no GI reasons to be on clear liquids.   Bowel  regimen added this morning.   Labs reviewed. Na 131 CBG: 66-78  Medications reviewed and include Colace, folic acid, MVI with minerals, Miralax, Senokot, thiamine. IVF: LR at 75 ml/h  Diet Order:   Diet Order            Diet clear liquid Room service appropriate? Yes; Fluid consistency: Thin  Diet effective now                 EDUCATION NEEDS:   No education needs have been identified at this time  Skin:  Skin Assessment: Reviewed RN Assessment  Last BM:  no BM documented  Height:   Ht Readings from Last 1 Encounters:  11/16/20 5\' 7"  (1.702 m)    Weight:   Wt Readings from Last 1 Encounters:  11/17/20 77 kg    Ideal Body Weight:  61.4 kg  BMI:  Body mass index is 26.59 kg/m.  Estimated Nutritional Needs:   Kcal:  2100-2300  Protein:  115-130 grams  Fluid:  > 2 L    11/19/20, RD, LDN, CNSC Please refer to Amion for contact information.

## 2020-11-21 LAB — BASIC METABOLIC PANEL
Anion gap: 10 (ref 5–15)
BUN: 89 mg/dL — ABNORMAL HIGH (ref 6–20)
CO2: 18 mmol/L — ABNORMAL LOW (ref 22–32)
Calcium: 6.8 mg/dL — ABNORMAL LOW (ref 8.9–10.3)
Chloride: 103 mmol/L (ref 98–111)
Creatinine, Ser: 2.63 mg/dL — ABNORMAL HIGH (ref 0.44–1.00)
GFR, Estimated: 24 mL/min — ABNORMAL LOW (ref >60)
Glucose, Bld: 100 mg/dL — ABNORMAL HIGH (ref 70–99)
Potassium: 5.7 mmol/L — ABNORMAL HIGH (ref 3.5–5.1)
Sodium: 131 mmol/L — ABNORMAL LOW (ref 135–145)

## 2020-11-21 LAB — COMPREHENSIVE METABOLIC PANEL
ALT: 11 U/L (ref 0–44)
AST: 18 U/L (ref 15–41)
Albumin: 1.1 g/dL — ABNORMAL LOW (ref 3.5–5.0)
Alkaline Phosphatase: 89 U/L (ref 38–126)
Anion gap: 12 (ref 5–15)
BUN: 90 mg/dL — ABNORMAL HIGH (ref 6–20)
CO2: 17 mmol/L — ABNORMAL LOW (ref 22–32)
Calcium: 6.7 mg/dL — ABNORMAL LOW (ref 8.9–10.3)
Chloride: 102 mmol/L (ref 98–111)
Creatinine, Ser: 2.69 mg/dL — ABNORMAL HIGH (ref 0.44–1.00)
GFR, Estimated: 23 mL/min — ABNORMAL LOW (ref >60)
Glucose, Bld: 103 mg/dL — ABNORMAL HIGH (ref 70–99)
Potassium: 5.2 mmol/L — ABNORMAL HIGH (ref 3.5–5.1)
Sodium: 131 mmol/L — ABNORMAL LOW (ref 135–145)
Total Bilirubin: 1.2 mg/dL (ref 0.3–1.2)
Total Protein: 6 g/dL — ABNORMAL LOW (ref 6.5–8.1)

## 2020-11-21 LAB — BODY FLUID CULTURE W GRAM STAIN

## 2020-11-21 LAB — HCV RT-PCR, QUANT (NON-GRAPH)

## 2020-11-21 LAB — CBC
HCT: 23.3 % — ABNORMAL LOW (ref 36.0–46.0)
Hemoglobin: 7.8 g/dL — ABNORMAL LOW (ref 12.0–15.0)
MCH: 26.5 pg (ref 26.0–34.0)
MCHC: 33.5 g/dL (ref 30.0–36.0)
MCV: 79.3 fL — ABNORMAL LOW (ref 80.0–100.0)
Platelets: 92 10*3/uL — ABNORMAL LOW (ref 150–400)
RBC: 2.94 MIL/uL — ABNORMAL LOW (ref 3.87–5.11)
RDW: 16.1 % — ABNORMAL HIGH (ref 11.5–15.5)
WBC: 9.6 10*3/uL (ref 4.0–10.5)
nRBC: 0 % (ref 0.0–0.2)

## 2020-11-21 LAB — LACTIC ACID, PLASMA: Lactic Acid, Venous: 0.8 mmol/L (ref 0.5–1.9)

## 2020-11-21 LAB — HCV AB W REFLEX TO QUANT PCR: HCV Ab: >11 s/co ratio — ABNORMAL HIGH (ref 0.0–0.9)

## 2020-11-21 LAB — CREATININE, URINE, RANDOM: Creatinine, Urine: 73.46 mg/dL

## 2020-11-21 LAB — SODIUM, URINE, RANDOM: Sodium, Ur: 26 mmol/L

## 2020-11-21 LAB — CALCIUM, IONIZED: Calcium, Ionized, Serum: 4.7 mg/dL (ref 4.5–5.6)

## 2020-11-21 MED ORDER — SENNA 8.6 MG PO TABS
2.0000 | ORAL_TABLET | Freq: Every day | ORAL | Status: DC
  Administered 2020-11-21 – 2020-12-29 (×22): 17.2 mg via ORAL
  Filled 2020-11-21 (×35): qty 2

## 2020-11-21 MED ORDER — SODIUM ZIRCONIUM CYCLOSILICATE 5 G PO PACK
15.0000 g | PACK | Freq: Once | ORAL | Status: AC
  Administered 2020-11-21: 15 g via ORAL
  Filled 2020-11-21: qty 3

## 2020-11-21 MED ORDER — BISACODYL 10 MG RE SUPP: 10.0000 mg | Freq: Every day | RECTAL | Status: DC | PRN

## 2020-11-21 MED ORDER — ALBUMIN HUMAN 25 % IV SOLN
25.0000 g | Freq: Two times a day (BID) | INTRAVENOUS | Status: DC
  Administered 2020-11-21 (×2): 25 g via INTRAVENOUS
  Filled 2020-11-21 (×2): qty 100

## 2020-11-21 MED ORDER — HYDROMORPHONE HCL 2 MG PO TABS
1.0000 mg | ORAL_TABLET | Freq: Four times a day (QID) | ORAL | Status: DC
  Administered 2020-11-21 (×2): 1 mg via ORAL
  Filled 2020-11-21 (×2): qty 1

## 2020-11-21 MED ORDER — OXYCODONE HCL 5 MG PO TABS
10.0000 mg | ORAL_TABLET | ORAL | Status: DC
  Administered 2020-11-21 – 2020-11-22 (×5): 10 mg via ORAL
  Filled 2020-11-21 (×5): qty 2

## 2020-11-21 MED ORDER — SODIUM ZIRCONIUM CYCLOSILICATE 5 G PO PACK
10.0000 g | PACK | Freq: Once | ORAL | Status: AC
  Administered 2020-11-21: 10 g via ORAL
  Filled 2020-11-21: qty 2

## 2020-11-21 MED ORDER — ALBUMIN HUMAN 5 % IV SOLN
INTRAVENOUS | Status: AC
  Filled 2020-11-21: qty 500

## 2020-11-21 MED ORDER — METHADONE HCL 5 MG PO TABS
15.0000 mg | ORAL_TABLET | Freq: Three times a day (TID) | ORAL | Status: DC
Start: 2020-11-21 — End: 2020-11-22
  Administered 2020-11-21 – 2020-11-22 (×3): 15 mg via ORAL
  Filled 2020-11-21 (×3): qty 3

## 2020-11-21 MED ORDER — SODIUM CHLORIDE 0.9% FLUSH: 10.0000 mL | INTRAVENOUS | Status: DC | PRN

## 2020-11-21 MED ORDER — SODIUM CHLORIDE 0.9% FLUSH
10.0000 mL | Freq: Two times a day (BID) | INTRAVENOUS | Status: DC
  Administered 2020-11-21 – 2020-12-05 (×18): 10 mL

## 2020-11-21 NOTE — Consult Note (Signed)
Nephrology Consult   Requesting provider: [facility] requesting consult: CCM Reason for consult: AKI   Assessment/Recommendations: Anne Bautista is a/an 35 y.o. female with a past medical history IVDU, tobacco use disorder, malnutrition who present w/ likely endocarditis with sepsis  Non-Oliguric AKI: Creatinine on arrival 1.3 improved to 0.8.  Creatinine rising since 2/15.  2.7 today.  Urinalysis with minimal proteinuria and no significant hematuria making infection associated GN less likely.  More likely a mix of ATN from sepsis and septic embolization as well as contrast associated injury. -Care at this time is supportive -Continue to monitor daily Cr, Dose meds for GFR -Monitor Daily I/Os, Daily weight  -Maintain MAP>65 for optimal renal perfusion.  -Avoid nephrotoxic medications including NSAIDs and Vanc/Zosyn combo -Currently no indication for HD -Administering albumin today with goal albumin of 2.5  Sepsis/MRSA bacteremia/endocarditis: Antibiotics and further management per primary team and infectious disease.  Blood pressure acceptable at this time  Anasarca: Likely related to hypoalbuminemia which is secondary to acute on chronic infection causing negative acute phase reactant.  Malnutrition also contributing.  Albumin administration as above would not use IV diuretics at this time  Hyperkalemia: Mild related to AKI.  Lokelma today.  Continue to monitor.  Anemia: Multifactorial with kidney disease contributing minimally.  No indication for ESA at this time.  Continue to monitor  Hypoxic respiratory failure: Minimal oxygen requirement at this time.  Continue to monitor per primary team  Severe malnutrition: Dietary involved.  Hyponatremia: Sodium 131.  Likely related to volume excess.  Continue to monitor  Recommendations conveyed to primary service.    [name] [facility] 11/21/2020 11:59  AM   _____________________________________________________________________________________ CC: AKI  History of Present Illness: Anne Bautista is a/an 35 y.o. female with a past medical history of IVDU, tobacco use disorder, malnutrition who presents with cough and malaise.  The patient was unable to provide history only intermittently groaning to questions.  Earlier making Noises per staff.  Therefore history was obtained per chart review.  Patient initially presented for cough and fatigue.  She also had diffuse pain that was unable to be localized.  Symptoms have been persistent for about 1 week.  Fevers also present with nonproductive cough and difficulties ambulating.  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  On arrival to the hospital the patient was tachycardic and tachypneic.  Creatinine on arrival was 1.3 and albumin was 2.1.  Leukocytosis and thrombocytopenia were also present.  Toxicology was positive for cocaine.  Urinalysis positive for minimal protein and no significant hematuria.  Chest x-ray demonstrated bilateral lung opacities.  CT abdomen pelvis concerning for multiple hypoenhancing lesions within the kidneys concerning for septic emboli.  CTA of the chest negative for PE.  Patient had worsening signs of sepsis on 2/14 and was transferred to the ICU.  Blood cultures have been positive for staph aureus.  She is undergoing treatment with antibiotics at this time.  Urine output has been slightly less and creatinine is risen since admission.   Medications:  Current Facility-Administered Medications  Medication Dose Route Frequency Provider Last Rate Last Admin  . 0.9 %  sodium chloride infusion   Intravenous PRN Steffanie Dunn, DO   Stopped at 11/18/20 1145  . acetaminophen (TYLENOL) tablet 650 mg  650 mg Oral Q6H PRN Jonah Blue, MD   650 mg at 11/20/20 2224   Or  . acetaminophen (TYLENOL) suppository 650 mg  650 mg Rectal Q6H PRN  Jonah Blue, MD      . albumin human 25 % solution 25 g  25 g Intravenous Q12H [name], MD      . bisacodyl (DULCOLAX) suppository 10 mg  10 mg Rectal Daily PRN Dellia Cloud, MD      . chlorhexidine (PERIDEX) 0.12 % solution 15 mL  15 mL Mouth Rinse BID Karie Fetch P, DO   15 mL at 11/21/20 0946  . Chlorhexidine Gluconate Cloth 2 % PADS 6 each  6 each Topical Q0600 Denasia, Venn, DO   6 each at 11/21/20 1033  . DAPTOmycin (CUBICIN) 770 mg in sodium chloride 0.9 % IVPB  10 mg/kg Intravenous Q2000 Odette Fraction, MD   Stopped at 11/20/20 2005  . docusate sodium (COLACE) capsule 100 mg  100 mg Oral BID Max Fickle B, MD   100 mg at 11/21/20 0945  . doxycycline (VIBRA-TABS) tablet 100 mg  100 mg Oral Q12H Odette Fraction, MD   100 mg at 11/21/20 1001  . feeding supplement (ENSURE ENLIVE / ENSURE PLUS) liquid 237 mL  237 mL Oral TID BM Karie Fetch P, DO   237 mL at 11/21/20 0946  . folic acid (FOLVITE) tablet 1 mg  1 mg Oral Daily Jonah Blue, MD   1 mg at 11/21/20 0945  . heparin injection 5,000 Units  5,000 Units Subcutaneous Q8H Leslye Peer, MD   5,000 Units at 11/21/20 0550  . HYDROmorphone (DILAUDID) injection 1 mg  1 mg Intravenous Q4H PRN Leslye Peer, MD   1 mg at 11/21/20 0551  . HYDROmorphone (DILAUDID) tablet 1 mg  1 mg Oral Q6H Dellia Cloud, MD   1 mg at 11/21/20 0947  . LORazepam (ATIVAN) injection 0.5-1 mg  0.5-1 mg Intravenous Q6H PRN Lupita Leash, MD   1 mg at 11/21/20 0039  . MEDLINE mouth rinse  15 mL Mouth Rinse q12n4p Vinnie, Bobst P, DO   15 mL at 11/21/20 1153  . methadone (DOLOPHINE) tablet 15 mg  15 mg Oral Q8H Byrum, Les Pou, MD      . metoprolol tartrate (LOPRESSOR) injection 2.5 mg  2.5 mg Intravenous Q6H PRN Regalado, Belkys A, MD   2.5 mg at 11/20/20 1934  . multivitamin with minerals tablet 1 tablet  1 tablet Oral Daily Odette Fraction, MD   1 tablet at 11/19/20 1718  . mupirocin ointment (BACTROBAN) 2 % 1 application  1 application  Nasal BID [place], [place], DO   1 application at 11/21/20 0946  . nicotine (NICODERM CQ - dosed in mg/24 hours) patch 14 mg  14 mg Transdermal Daily PRN Jonah Blue, MD      . oxyCODONE (Oxy IR/ROXICODONE) immediate release tablet 10 mg  10 mg Oral Q6H Max Fickle B, MD   10 mg at 11/21/20 1154  . polyethylene glycol (MIRALAX / GLYCOLAX) packet 17 g  17 g Oral BID Max Fickle B, MD   17 g at 11/21/20 0945  . QUEtiapine (SEROQUEL) tablet 25 mg  25 mg Oral BID Dellia Cloud, MD   25 mg at 11/21/20 0945  . senna (SENOKOT) tablet 17.2 mg  2 tablet Oral Daily Dellia Cloud, MD   17.2 mg at 11/21/20 0945  . sodium chloride flush (NS) 0.9 % injection 10-40 mL  10-40 mL Intracatheter Q12H Leslye Peer, MD   10 mL at 11/21/20 0947  . sodium chloride flush (NS) 0.9 % injection 10-40 mL  10-40 mL Intracatheter PRN Byrum,  Les Pou, MD      . sodium chloride flush (NS) 0.9 % injection 3 mL  3 mL Intravenous Q12H Jonah Blue, MD   3 mL at 11/21/20 0947  . thiamine tablet 100 mg  100 mg Oral Daily Jonah Blue, MD   100 mg at 11/21/20 0945     ALLERGIES Patient has no known allergies.  MEDICAL HISTORY Past Medical History:  Diagnosis Date  . IV drug user   . Malnutrition (HCC) 11/16/2020  . Poor dentition 11/16/2020  . Tobacco dependence 11/16/2020     SOCIAL HISTORY Social History   Socioeconomic History  . Marital status: Single    Spouse name: Not on file  . Number of children: Not on file  . Years of education: Not on file  . Highest education level: Not on file  Occupational History  . Occupation: unemployed  Tobacco Use  . Smoking status: Current Every Day Smoker    Packs/day: 1.00    Years: 18.00    Pack years: 18.00    Types: Cigarettes  . Smokeless tobacco: Never Used  . Tobacco comment: last used 2 weeks ago  Substance and Sexual Activity  . Alcohol use: Never  . Drug use: Yes    Types: Cocaine, Marijuana    Comment: daily use; heroin previously and 3  days ago; occasional marijuana  . Sexual activity: Not on file  Other Topics Concern  . Not on file  Social History Narrative  . Not on file   Social Determinants of Health   Financial Resource Strain: Not on file  Food Insecurity: Not on file  Transportation Needs: Not on file  Physical Activity: Not on file  Stress: Not on file  Social Connections: Not on file  Intimate Partner Violence: Not on file     FAMILY HISTORY History reviewed. No pertinent family history.  Patient unable to provide history due to altered mental status   Review of Systems: Patient unable to answer questions for ROS due to altered mental status  Physical Exam: Vitals:   11/21/20 0800 11/21/20 1107  BP: 132/71   Pulse: (!) 103   Resp: (!) 38   Temp:  97.8 F (36.6 C)  SpO2: 95%    No intake/output data recorded.  Intake/Output Summary (Last 24 hours) at 11/21/2020 1159 Last data filed at 11/21/2020 0600 Gross per 24 hour  Intake 2154.1 ml  Output --  Net 2154.1 ml   General: Ill-appearing, lying in bed, mild distress HEENT: anicteric sclera, oropharynx clear without lesions CV: Tachycardia, regular rhythm, anasarca with lower extremity pitting edema 1+ Lungs: Coarse bilateral breath sounds, tachypneic, mild increased work of breathing Abd: soft, non-tender, non-distended Skin: Erythema of left shin, otherwise no visible lesions or rashes Psych: Awake, alert, unable to answer questions Musculoskeletal: no obvious deformities Neuro: Moves all extremities spontaneously, unable to answer questions, global dysfunction without obvious focal deficit  Test Results Reviewed Lab Results  Component Value Date   NA 131 (L) 11/21/2020   K 5.2 (H) 11/21/2020   CL 102 11/21/2020   CO2 17 (L) 11/21/2020   BUN 90 (H) 11/21/2020   CREATININE 2.69 (H) 11/21/2020   CALCIUM 6.7 (L) 11/21/2020   ALBUMIN 1.1 (L) 11/21/2020     I have reviewed all relevant outside healthcare records related to the  patient's current hospitalization

## 2020-11-21 NOTE — Progress Notes (Signed)
Occupational Therapy Treatment Note  Pt initially not wanting to participate with therapy. +2 Mod A to stand  - improved upright posture but unable to maintain more than 10 seconds; overall max to total A with ADL tasks. Attempted to engage pt in functional tasks. Coughing with liquids - recommend ST screen pt to assure safe swallowing. Moaning and mumbling throughout session - difficult to understand at times. Encourage nsg to keep BUE elevated on pillows to reduce dependent edema.  Encourage chair position with nsg over the weekend. Will continue to follow acutely.  Desat to 88 on RA with activity; RR in 40s throughoutmost of session. This is increased O2 demands from previous PT session. - Nsg made aware    11/21/20 1600  OT Visit Information  Last OT Received On 11/21/20  Assistance Needed +2  PT/OT/SLP Co-Evaluation/Treatment Yes  Reason for Co-Treatment Complexity of the patient's impairments (multi-system involvement);Necessary to address cognition/behavior during functional activity;For patient/therapist safety;To address functional/ADL transfers  OT goals addressed during session ADL's and self-care;Strengthening/ROM  History of Present Illness 35 yo admitted 2/14 with sepsis due to MRSA bacteremia in setting of IVDU. Rt shoulder pain s/p aspiration 2/14. PMHx: IVDU, malnutrition, poor dentition  Precautions  Precautions Fall  Precaution Comments right shoulder, left ankle, back pain, watch sats/RR, bil UE edema  Pain Assessment  Pain Assessment Faces  Faces Pain Scale 8  Pain Location R shoulder, L ankle, back  Pain Descriptors / Indicators Aching;Guarding;Moaning  Pain Intervention(s) Limited activity within patient's tolerance;Relaxation  Cognition  Arousal/Alertness Lethargic  Behavior During Therapy Impulsive;Flat affect;Anxious;Restless  Overall Cognitive Status Impaired/Different from baseline  Orientation Level Disoriented to;Situation  Current Attention Level  Sustained  Memory Decreased short-term memory  Following Commands Follows one step commands with increased time  Safety/Judgement Decreased awareness of safety;Decreased awareness of deficits  Problem Solving Slow processing;Decreased initiation;Difficulty sequencing;Requires verbal cues;Requires tactile cues  General Comments pt with mumbling and moaning throughout session with decreased ability to understand her at times. Pt with decreased awareness of function but also self limiting due to pain  ADL  Overall ADL's  Needs assistance/impaired  Eating/Feeding Maximal assistance  Eating/Feeding Details (indicate cue type and reason) coughing with liquids - nsg made aware  Grooming Maximal assistance;Bed level  Grooming Details (indicate cue type and reason) able to hold cloth to wipe her mouth and eyes  Upper Body Bathing Maximal assistance;Bed level  Lower Body Bathing Total assistance;Bed level  Toileting- Clothing Manipulation and Hygiene Total assistance  Functional mobility during ADLs Moderate assistance;+2 for physical assistance  General ADL Comments may benefit form built up tubing once pt more participatory  Bed Mobility  Overal bed mobility Needs Assistance  Bed Mobility Supine to Sit;Sit to Supine;Rolling  Supine to sit Mod assist  Sit to supine Mod assist  Balance  Overall balance assessment Needs assistance  Sitting-balance support No upper extremity supported;Feet supported  Sitting balance-Leahy Scale Poor  Sitting balance - Comments EOB with UE support, guarding for safety with flexed trunk  Standing balance support Bilateral upper extremity supported  Standing balance-Leahy Scale Poor  Standing balance comment reliant on trunk and UE support  Vision- Assessment  Additional Comments VC to open eyes; poor visual attention  Transfers  Overall transfer level Needs assistance  Equipment used 2 person hand held assist  Transfers Sit to/from Stand  Sit to Stand Mod  assist;+2 safety/equipment  General transfer comment mod +2 with use of belt and pad to fully stand with max cues for trunk  and hip extension as pt maintaining flexion x2 trials. Pt side stepped toward left to HOB x 2 with max assist to move LLE  General Comments  General comments (skin integrity, edema, etc.) moaning and mumbling throughout; educated nsg on need to keep BUE elevated to reduce dependent edema; encouraeg chair position at bed level; pt does better with exercises when she has to count; does best with expectations are understood  Exercises  Exercises General Upper Extremity  General Exercises - Upper Extremity  Shoulder ABduction PROM;AROM;AAROM;Strengthening;Both;10 reps;Seated  Elbow Extension PROM;AAROM;AROM;Strengthening;Both;10 reps;Seated  Shoulder Flexion Both;AROM;PROM;AAROM;10 reps;Seated  Elbow Flexion PROM;AROM;AAROM;Both;10 reps;Seated  Digit Composite Flexion PROM;AAROM;AROM;Strengthening;Both;10 reps;Seated  Composite Extension PROM;AROM;AAROM;Strengthening;Both;10 reps;Seated  OT - End of Session  Equipment Utilized During Treatment Oxygen (2L)  Activity Tolerance Patient limited by fatigue;Patient limited by lethargy;Patient limited by pain  Patient left in bed;with call bell/phone within reach;with bed alarm set (chair position)  Nurse Communication Mobility status;Other (comment) (coughing with liquids)  OT Assessment/Plan  OT Plan Discharge plan remains appropriate  OT Visit Diagnosis Unsteadiness on feet (R26.81);Muscle weakness (generalized) (M62.81);Other symptoms and signs involving cognitive function;Pain  OT Frequency (ACUTE ONLY) Min 2X/week  Follow Up Recommendations SNF;Supervision/Assistance - 24 hour  OT Equipment None recommended by OT  AM-PAC OT "6 Clicks" Daily Activity Outcome Measure (Version 2)  Help from another person eating meals? 2  Help from another person taking care of personal grooming? 2  Help from another person toileting,  which includes using toliet, bedpan, or urinal? 1  Help from another person bathing (including washing, rinsing, drying)? 2  Help from another person to put on and taking off regular upper body clothing? 1  Help from another person to put on and taking off regular lower body clothing? 1  6 Click Score 9  OT Goal Progression  Progress towards OT goals Progressing toward goals  Acute Rehab OT Goals  Patient Stated Goal to not be in pain  OT Goal Formulation Patient unable to participate in goal setting  Time For Goal Achievement 12/03/20  Potential to Achieve Goals Good  ADL Goals  Pt Will Perform Eating with set-up;with adaptive utensils;sitting  Pt Will Perform Grooming with supervision;sitting  Pt Will Perform Upper Body Dressing with supervision;sitting  Pt Will Perform Lower Body Dressing sit to/from stand;with min guard assist  Pt Will Transfer to Toilet with min guard assist;ambulating  Pt/caregiver will Perform Home Exercise Program Increased strength;Both right and left upper extremity  Additional ADL Goal #1 Pt following 1-2 step commands with minimal multimodal cues.  OT Time Calculation  OT Start Time (ACUTE ONLY) 1159  OT Stop Time (ACUTE ONLY) 1244  OT Time Calculation (min) 45 min  OT General Charges  $OT Visit 1 Visit  OT Treatments  $Self Care/Home Management  8-22 mins  $Therapeutic Activity 8-22 mins  Luisa Dago, OT/L   Acute OT Clinical Specialist Acute Rehabilitation Services Pager [phone] Office [phone]

## 2020-11-21 NOTE — Progress Notes (Signed)
Physical Therapy Treatment Patient Details Name: Anne Bautista MRN: [id number] DOB: 1986/03/18 Today's Date: 11/21/2020    History of Present Illness 35 yo admitted 2/14 with sepsis due to MRSA bacteremia in setting of IVDU. Rt shoulder pain s/p aspiration 2/14. PMHx: IVDU, malnutrition, poor dentition    PT Comments    Pt moaning throughout with continued mumbled speech and limited eye opening. Pt with RR 30-40 throughout with sats 93-96% on 2L and desaturation to 88% on RA with brief attempt today. Pt continues to require max cues and encouragement to participate and mobilize with pt stating she did not want to get to chair today. Pt able to stand x 2 trials with 2 person assist and step toward HOB. Pt educated for bil LE HEP in full chair position with pt limited by fatigue and pain. Pt also noted to be coughing when attempting to drink and RN made aware for potential SLP eval.   Follow Up Recommendations  SNF;Supervision for mobility/OOB     Equipment Recommendations  Wheelchair (measurements PT);3in1 (PT)    Recommendations for Other Services       Precautions / Restrictions Precautions Precautions: Fall Precaution Comments: right shoulder, left ankle, back pain, watch sats/RR, bil UE edema    Mobility  Bed Mobility Overal bed mobility: Needs Assistance Bed Mobility: Supine to Sit;Sit to Supine;Rolling Rolling: Min guard   Supine to sit: Min assist;HOB elevated Sit to supine: Min assist   General bed mobility comments: HOB 30 degrees with assist to shift legs off bed and cues to elevate trunk. Pt able to roll with cues for initiation bil for pericare. Return to supine cues to control descent of trunk with Assist to lift RLE and pt able to lift LLE on her own    Transfers Overall transfer level: Needs assistance   Transfers: Sit to/from Stand Sit to Stand: Mod assist;+2 safety/equipment         General transfer comment: mod +2 with use of belt and pad to fully stand  with max cues for trunk and hip extension as pt maintaining flexion x2 trials. Pt side stepped toward left to HOB x 2 with max assist to move LLE  Ambulation/Gait                 Stairs             Wheelchair Mobility    Modified Rankin (Stroke Patients Only)       Balance Overall balance assessment: Needs assistance Sitting-balance support: No upper extremity supported;Feet supported Sitting balance-Leahy Scale: Poor Sitting balance - Comments: EOB with UE support, guarding for safety with flexed trunk   Standing balance support: Bilateral upper extremity supported Standing balance-Leahy Scale: Poor Standing balance comment: reliant on trunk and UE support                            Cognition Arousal/Alertness: Awake/alert;Lethargic Behavior During Therapy: Restless;Flat affect;Anxious Overall Cognitive Status: Impaired/Different from baseline Area of Impairment: Following commands;Safety/judgement;Orientation;Problem solving;Attention;Memory                   Current Attention Level: Sustained Memory: Decreased recall of precautions;Decreased short-term memory Following Commands: Follows one step commands with increased time Safety/Judgement: Decreased awareness of safety;Decreased awareness of deficits   Problem Solving: Slow processing;Decreased initiation General Comments: pt with mumbling and moaning throughout session with decreased ability to understand her at times. Pt with decreased awareness of function but also  self limiting due to pain      Exercises General Exercises - Lower Extremity Long Arc Quad: AAROM;Both;Seated;10 reps Hip Flexion/Marching: AAROM;Both;Seated;10 reps    General Comments        Pertinent Vitals/Pain Pain Assessment: Faces Faces Pain Scale: Hurts even more Pain Location: R shoulder, L ankle, back Pain Descriptors / Indicators: Aching;Guarding;Moaning Pain Intervention(s): Limited activity within  patient's tolerance;Repositioned    Home Living                      Prior Function            PT Goals (current goals can now be found in the care plan section) Progress towards PT goals: Progressing toward goals    Frequency    Min 3X/week      PT Plan Current plan remains appropriate    Co-evaluation PT/OT/SLP Co-Evaluation/Treatment: Yes Reason for Co-Treatment: Complexity of the patient's impairments (multi-system involvement);For patient/therapist safety PT goals addressed during session: Mobility/safety with mobility;Strengthening/ROM        AM-PAC PT "6 Clicks" Mobility   Outcome Measure  Help needed turning from your back to your side while in a flat bed without using bedrails?: A Little Help needed moving from lying on your back to sitting on the side of a flat bed without using bedrails?: A Little Help needed moving to and from a bed to a chair (including a wheelchair)?: A Lot Help needed standing up from a chair using your arms (e.g., wheelchair or bedside chair)?: A Lot Help needed to walk in hospital room?: Total Help needed climbing 3-5 steps with a railing? : Total 6 Click Score: 12    End of Session Equipment Utilized During Treatment: Gait belt Activity Tolerance: Patient limited by pain Patient left: in bed;with call bell/phone within reach;with bed alarm set Nurse Communication: Mobility status;Precautions PT Visit Diagnosis: Other abnormalities of gait and mobility (R26.89);Difficulty in walking, not elsewhere classified (R26.2);Pain;Muscle weakness (generalized) (M62.81)     Time: 4315-4008 PT Time Calculation (min) (ACUTE ONLY): 36 min  Charges:  $Therapeutic Activity: 8-22 mins                     Miryam Mcelhinney P, PT Acute Rehabilitation Services Pager: [phone] Office: [phone]    Mikell Kazlauskas B Aiden Rao 11/21/2020, 1:10 PM

## 2020-11-21 NOTE — Progress Notes (Signed)
[facility] Infectious Diseases Follow Up Note  Patient Identification: Patient Name: Anne Bautista MRN: [id number] Admit Date: 11/15/2020  3:21 PM Age: 35 y.o.Today's Date: 11/21/2020   Reason for Visit: bacteremia   Principal Problem:   Severe sepsis due to methicillin resistant Staphylococcus aureus (MRSA) with acute organ dysfunction (HCC) Active Problems:   IV drug abuse (HCC)   Poor dentition   Malnutrition (HCC)   Thrombocytopenia (HCC)   Tobacco dependence   Acute cystitis without hematuria  Antibiotics: Cefepime 2/12-2/13                    Vancomycin 2/12- 2/15                    Daptomycin 2/16-                    Doxycyline 2/16-  Lines/Tubes: PIVs, urinary catheter   Interval Events: Repeat blood cx 2/14 1/2 sets Staph aureus, afebrile, no leukocytosis , ON Nasal cannula   Assessment # 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 Xray with cellulitis  # IVDU # Withdrawals  T# hrombocytoenia -  Improving, in the setting of sepsis and endocarditis # AKI  on CKD - Nephro has been consulted  # Medication monitoring   Recommendations Continue daptomycin. CPK 280  Doxycyline for 5  days for pulmonary coverage No plans for surgical intervention by CT sx. Will defer need for TEE to CT Sx given no immediate plans for surgical intervention Repeat blood cx 2/18 Fu respiratory cultures ( needs to be collected ) Monitor CBC, BMP and CPK Withdrawal management per CCM   Will follow peripherally over the weekend.   Rest of the management as per the primary team. Thank you for the consult. Please page with pertinent questions or  concerns.  ______________________________________________________________________ Subjective patient seen and examined at the bedside. Moaning, drowsy   Vitals BP 132/71 (BP Location: Right Arm)   Pulse (!) 103   Temp 97.8 F (36.6 C) (Oral)   Resp (!) 38   Ht  (1.702 m)   Wt 77 kg   LMP 11/10/2020   SpO2 95%   BMI 26.59 kg/m   Moaning and drowsym on Nasal cannula HEENT - oral mucosa very dry Chest - coarse breath sounds CVS-Normal s1s2, RRR systolic murmur  Abdomen - soft, NT Extremities - left ankle and foot swelling/redness  Pertinent Microbiology Results for orders placed or performed during the hospital encounter of 11/15/20  Resp Panel by RT-PCR (Flu A&B, Covid) Peripheral     Status: None   Collection Time: 11/15/20  4:10 PM   Specimen: Peripheral; Nasopharyngeal(NP) swabs in vial transport medium  Result Value Ref Range Status   SARS Coronavirus 2 by RT PCR NEGATIVE NEGATIVE Final    Comment: (NOTE) SARS-CoV-2 target nucleic acids are NOT DETECTED.  The SARS-CoV-2 RNA is generally detectable in upper respiratory specimens during the acute phase of infection. The lowest concentration of SARS-CoV-2 viral copies this assay can detect is 138 copies/mL. A negative result does not preclude SARS-Cov-2 infection and should not be used as the sole basis for treatment or other patient management decisions. A negative result may occur with  improper specimen collection/handling, submission of specimen  other than nasopharyngeal swab, presence of viral mutation(s) within the areas targeted by this assay, and inadequate number of viral copies(<138 copies/mL). A negative result must be combined with clinical observations, patient history, and epidemiological information. The expected result is Negative.  Fact Sheet for Patients:  BloggerCourse.com  Fact Sheet for Healthcare Providers:  SeriousBroker.it  This test  is no t yet approved or cleared by the [place] FDA and  has been authorized for detection and/or diagnosis of SARS-CoV-2 by FDA under an Emergency Use Authorization (EUA). This EUA will remain  in effect (meaning this test can be used) for the duration of the COVID-19 declaration under Section 564(b)(1) of the Act, 21 U.S.C.section 360bbb-3(b)(1), unless the authorization is terminated  or revoked sooner.       Influenza A by PCR NEGATIVE NEGATIVE Final   Influenza B by PCR NEGATIVE NEGATIVE Final    Comment: (NOTE) The Xpert Xpress SARS-CoV-2/FLU/RSV plus assay is intended as an aid in the diagnosis of influenza from Nasopharyngeal swab specimens and should not be used as a sole basis for treatment. Nasal washings and aspirates are unacceptable for Xpert Xpress SARS-CoV-2/FLU/RSV testing.  Fact Sheet for Patients: BloggerCourse.com  Fact Sheet for Healthcare Providers: SeriousBroker.it  This test is not yet approved or cleared by the [place] FDA and has been authorized for detection and/or diagnosis of SARS-CoV-2 by FDA under an Emergency Use Authorization (EUA). This EUA will remain in effect (meaning this test can be used) for the duration of the COVID-19 declaration under Section 564(b)(1) of the Act, 21 U.S.C. section 360bbb-3(b)(1), unless the authorization is terminated or revoked.  Performed at [facility], [address] [place], [place] [address]   Blood culture (routine x 2)     Status: Abnormal   Collection Time: 11/15/20  4:10 PM   Specimen: BLOOD  Result Value Ref Range Status   Specimen Description   Final    BLOOD LEFT ANTECUBITAL Performed at [facility], [address] [place], [place] [address]    Special Requests   Final    BOTTLES DRAWN AEROBIC AND ANAEROBIC Blood Culture adequate volume Performed at [facility], [address] [place], [place]  [address]    Culture  Setup Time   Final    GRAM POSITIVE COCCI IN BOTH AEROBIC AND ANAEROBIC BOTTLES CRITICAL RESULT CALLED TO, READ BACK BY AND VERIFIED WITH: CINDY REED,RN  11/16/20 EB Performed at [facility], [address] [place], [place] [address]    Culture METHICILLIN RESISTANT STAPHYLOCOCCUS AUREUS (A)  Final   Report Status 11/18/2020 FINAL  Final   Organism ID, Bacteria METHICILLIN RESISTANT STAPHYLOCOCCUS AUREUS  Final      Susceptibility   Methicillin resistant staphylococcus aureus - MIC*    CIPROFLOXACIN <=0.5 SENSITIVE Sensitive     ERYTHROMYCIN >=8 RESISTANT Resistant     GENTAMICIN <=0.5 SENSITIVE Sensitive     OXACILLIN >=4 RESISTANT Resistant     TETRACYCLINE <=1 SENSITIVE Sensitive     VANCOMYCIN 1 SENSITIVE Sensitive     TRIMETH/SULFA <=10 SENSITIVE Sensitive     CLINDAMYCIN <=0.25 SENSITIVE Sensitive     RIFAMPIN <=0.5 SENSITIVE Sensitive     Inducible Clindamycin NEGATIVE Sensitive     * METHICILLIN RESISTANT STAPHYLOCOCCUS AUREUS  Blood Culture ID Panel (Reflexed)     Status: Abnormal   Collection Time: 11/15/20  4:10 PM  Result Value Ref Range Status   Enterococcus faecalis NOT  DETECTED NOT DETECTED Final   Enterococcus Faecium NOT DETECTED NOT DETECTED Final   Listeria monocytogenes NOT DETECTED NOT DETECTED Final   Staphylococcus species DETECTED (A) NOT DETECTED Final    Comment: CRITICAL RESULT CALLED TO, READ BACK BY AND VERIFIED WITH: CINDY REED,RN @0956  11/16/20 EB    Staphylococcus aureus (BCID) DETECTED (A) NOT DETECTED Final    Comment: Methicillin (oxacillin)-resistant Staphylococcus aureus (MRSA). MRSA is predictably resistant to beta-lactam antibiotics (except ceftaroline). Preferred therapy is vancomycin unless clinically contraindicated. Patient requires contact precautions if  hospitalized. CRITICAL RESULT CALLED TO, READ BACK BY AND VERIFIED WITH: CINDY REED,RN @0956  11/16/20 EB    Staphylococcus epidermidis NOT DETECTED NOT  DETECTED Final   Staphylococcus lugdunensis NOT DETECTED NOT DETECTED Final   Streptococcus species NOT DETECTED NOT DETECTED Final   Streptococcus agalactiae NOT DETECTED NOT DETECTED Final   Streptococcus pneumoniae NOT DETECTED NOT DETECTED Final   Streptococcus pyogenes NOT DETECTED NOT DETECTED Final   A.calcoaceticus-baumannii NOT DETECTED NOT DETECTED Final   Bacteroides fragilis NOT DETECTED NOT DETECTED Final   Enterobacterales NOT DETECTED NOT DETECTED Final   Enterobacter cloacae complex NOT DETECTED NOT DETECTED Final   Escherichia coli NOT DETECTED NOT DETECTED Final   Klebsiella aerogenes NOT DETECTED NOT DETECTED Final   Klebsiella oxytoca NOT DETECTED NOT DETECTED Final   Klebsiella pneumoniae NOT DETECTED NOT DETECTED Final   Proteus species NOT DETECTED NOT DETECTED Final   Salmonella species NOT DETECTED NOT DETECTED Final   Serratia marcescens NOT DETECTED NOT DETECTED Final   Haemophilus influenzae NOT DETECTED NOT DETECTED Final   Neisseria meningitidis NOT DETECTED NOT DETECTED Final   Pseudomonas aeruginosa NOT DETECTED NOT DETECTED Final   Stenotrophomonas maltophilia NOT DETECTED NOT DETECTED Final   Candida albicans NOT DETECTED NOT DETECTED Final   Candida auris NOT DETECTED NOT DETECTED Final   Candida glabrata NOT DETECTED NOT DETECTED Final   Candida krusei NOT DETECTED NOT DETECTED Final   Candida parapsilosis NOT DETECTED NOT DETECTED Final   Candida tropicalis NOT DETECTED NOT DETECTED Final   Cryptococcus neoformans/gattii NOT DETECTED NOT DETECTED Final   Meth resistant mecA/C and MREJ DETECTED (A) NOT DETECTED Final    Comment: CRITICAL RESULT CALLED TO, READ BACK BY AND VERIFIED WITH: CINDY REED,RN @0956  11/16/20 EB Performed at [facility], [address], [place], [facility] [address]   Blood culture (routine x 2)     Status: Abnormal   Collection Time: 11/15/20  4:25 PM   Specimen: BLOOD  Result Value Ref Range Status   Specimen  Description   Final    BLOOD LEFT ANTECUBITAL Performed at [facility], [address] [place], [facility] [address]    Special Requests   Final    BOTTLES DRAWN AEROBIC ONLY Blood Culture results may not be optimal due to an inadequate volume of blood received in culture bottles Performed at [facility], [address] [place], [facility] [address]    Culture  Setup Time   Final    GRAM POSITIVE COCCI IN CLUSTERS AEROBIC BOTTLE ONLY CRITICAL VALUE NOTED.  VALUE IS CONSISTENT WITH PREVIOUSLY REPORTED AND CALLED VALUE.    Culture (A)  Final    STAPHYLOCOCCUS AUREUS SUSCEPTIBILITIES PERFORMED ON PREVIOUS CULTURE WITHIN THE LAST 5 DAYS. Performed at [facility], [address], [place], [facility] [address]    Report Status 11/18/2020 FINAL  Final  Urine culture     Status: Abnormal  Collection Time: 11/15/20  7:47 PM   Specimen: Urine, Catheterized  Result Value Ref Range Status   Specimen Description   Final    URINE, CATHETERIZED Performed at [facility], [address] [place], [place] [address]    Special Requests   Final    Normal Performed at [facility], [address] [place], [place] [address]    Culture (A)  Final    >=100,000 COLONIES/mL METHICILLIN RESISTANT STAPHYLOCOCCUS AUREUS   Report Status 11/18/2020 FINAL  Final   Organism ID, Bacteria METHICILLIN RESISTANT STAPHYLOCOCCUS AUREUS (A)  Final      Susceptibility   Methicillin resistant staphylococcus aureus - MIC*    CIPROFLOXACIN <=0.5 SENSITIVE Sensitive     GENTAMICIN <=0.5 SENSITIVE Sensitive     NITROFURANTOIN <=16 SENSITIVE Sensitive     OXACILLIN >=4 RESISTANT Resistant     TETRACYCLINE <=1 SENSITIVE Sensitive     VANCOMYCIN <=0.5 SENSITIVE Sensitive     TRIMETH/SULFA <=10 SENSITIVE Sensitive     CLINDAMYCIN <=0.25 SENSITIVE Sensitive     RIFAMPIN <=0.5 SENSITIVE Sensitive     Inducible Clindamycin NEGATIVE Sensitive     * >=100,000 COLONIES/mL  METHICILLIN RESISTANT STAPHYLOCOCCUS AUREUS  Culture, blood (single)     Status: Abnormal   Collection Time: 11/15/20  9:35 PM   Specimen: BLOOD RIGHT HAND  Result Value Ref Range Status   Specimen Description   Final    BLOOD RIGHT HAND Performed at [facility], [address] [place], [place] [address]    Special Requests   Final    BOTTLES DRAWN AEROBIC AND ANAEROBIC Blood Culture adequate volume Performed at [facility], [address] [place], [place] [address]    Culture  Setup Time   Final    GRAM POSITIVE COCCI IN CLUSTERS IN BOTH AEROBIC AND ANAEROBIC BOTTLES CRITICAL VALUE NOTED.  VALUE IS CONSISTENT WITH PREVIOUSLY REPORTED AND CALLED VALUE.    Culture (A)  Final    STAPHYLOCOCCUS AUREUS SUSCEPTIBILITIES PERFORMED ON PREVIOUS CULTURE WITHIN THE LAST 5 DAYS. Performed at [facility], [address], [place], [place] [address]    Report Status 11/20/2020 FINAL  Final  Culture, blood (routine x 2)     Status: None (Preliminary result)   Collection Time: 11/17/20  9:28 AM   Specimen: BLOOD RIGHT FOREARM  Result Value Ref Range Status   Specimen Description BLOOD RIGHT FOREARM  Final   Special Requests   Final    BOTTLES DRAWN AEROBIC AND ANAEROBIC Blood Culture results may not be optimal due to an inadequate volume of blood received in culture bottles   Culture   Final    NO GROWTH 3 DAYS Performed at [facility], [address] [place], [place] [address]    Report Status PENDING  Incomplete  Culture, blood (routine x 2)     Status: Abnormal (Preliminary result)   Collection Time: 11/17/20  9:39 AM   Specimen: BLOOD RIGHT HAND  Result Value Ref Range Status   Specimen Description BLOOD RIGHT HAND  Final   Special Requests   Final    BOTTLES DRAWN AEROBIC AND ANAEROBIC Blood Culture results may not be optimal due to an inadequate volume of blood received in culture bottles   Culture  Setup Time   Final    GRAM POSITIVE COCCI  IN CLUSTERS ANAEROBIC BOTTLE ONLY Organism ID to follow CRITICAL RESULT CALLED TO, READ BACK BY  AND VERIFIED WITHThresa Ross PHARMD, AT 4010 11/20/20 D. Leighton Roach    Culture (A)  Final    STAPHYLOCOCCUS AUREUS SUSCEPTIBILITIES TO FOLLOW Performed at [facility], [address], [place], [place] [address]    Report Status PENDING  Incomplete  Blood Culture ID Panel (Reflexed)     Status: Abnormal   Collection Time: 11/17/20  9:39 AM  Result Value Ref Range Status   Enterococcus faecalis NOT DETECTED NOT DETECTED Final   Enterococcus Faecium NOT DETECTED NOT DETECTED Final   Listeria monocytogenes NOT DETECTED NOT DETECTED Final   Staphylococcus species DETECTED (A) NOT DETECTED Final    Comment: CRITICAL RESULT CALLED TO, READ BACK BY AND VERIFIED WITHThresa Ross PHARMD, AT 6644 11/20/20 D. VANHOOK    Staphylococcus aureus (BCID) DETECTED (A) NOT DETECTED Final    Comment: Methicillin (oxacillin)-resistant Staphylococcus aureus (MRSA). MRSA is predictably resistant to beta-lactam antibiotics (except ceftaroline). Preferred therapy is vancomycin unless clinically contraindicated. Patient requires contact precautions if  hospitalized. CRITICAL RESULT CALLED TO, READ BACK BY AND VERIFIED WITHThresa Ross PHARMD, AT 0347 11/20/20 D. VANHOOK    Staphylococcus epidermidis NOT DETECTED NOT DETECTED Final   Staphylococcus lugdunensis NOT DETECTED NOT DETECTED Final   Streptococcus species NOT DETECTED NOT DETECTED Final   Streptococcus agalactiae NOT DETECTED NOT DETECTED Final   Streptococcus pneumoniae NOT DETECTED NOT DETECTED Final   Streptococcus pyogenes NOT DETECTED NOT DETECTED Final   A.calcoaceticus-baumannii NOT DETECTED NOT DETECTED Final   Bacteroides fragilis NOT DETECTED NOT DETECTED Final   Enterobacterales NOT DETECTED NOT DETECTED Final   Enterobacter cloacae complex NOT DETECTED NOT DETECTED Final   Escherichia coli NOT DETECTED NOT DETECTED Final    Klebsiella aerogenes NOT DETECTED NOT DETECTED Final   Klebsiella oxytoca NOT DETECTED NOT DETECTED Final   Klebsiella pneumoniae NOT DETECTED NOT DETECTED Final   Proteus species NOT DETECTED NOT DETECTED Final   Salmonella species NOT DETECTED NOT DETECTED Final   Serratia marcescens NOT DETECTED NOT DETECTED Final   Haemophilus influenzae NOT DETECTED NOT DETECTED Final   Neisseria meningitidis NOT DETECTED NOT DETECTED Final   Pseudomonas aeruginosa NOT DETECTED NOT DETECTED Final   Stenotrophomonas maltophilia NOT DETECTED NOT DETECTED Final   Candida albicans NOT DETECTED NOT DETECTED Final   Candida auris NOT DETECTED NOT DETECTED Final   Candida glabrata NOT DETECTED NOT DETECTED Final   Candida krusei NOT DETECTED NOT DETECTED Final   Candida parapsilosis NOT DETECTED NOT DETECTED Final   Candida tropicalis NOT DETECTED NOT DETECTED Final   Cryptococcus neoformans/gattii NOT DETECTED NOT DETECTED Final   Meth resistant mecA/C and MREJ DETECTED (A) NOT DETECTED Final    Comment: CRITICAL RESULT CALLED TO, READ BACK BY AND VERIFIED WITHThresa Ross PHARMD, AT 4259 11/20/20 Renato Shin Performed at [facility], [address] [place], [place] [address]   MRSA PCR Screening     Status: Abnormal   Collection Time: 11/17/20 11:38 AM   Specimen: Nasopharyngeal  Result Value Ref Range Status   MRSA by PCR POSITIVE (A) NEGATIVE Final    Comment:        The GeneXpert MRSA Assay (FDA approved for NASAL specimens only), is one component of a comprehensive MRSA colonization surveillance program. It is not intended to diagnose MRSA infection nor to guide or monitor treatment for MRSA infections. RESULT CALLED TO, READ BACK BY AND VERIFIED WITH: Madelin Rear RN 1400 11/17/20 A BROWNING Performed at [facility], [address] [place], [place] [address]   Body fluid culture w Gram Stain     Status: None   Collection Time: 11/17/20  4:05 PM   Specimen: Synovium; Body  Fluid  Result Value Ref Range Status   Specimen Description SYNOVIAL RIGHT SHOULDER  Final   Special Requests NONE  Final   Gram Stain   Final    ABUNDANT WBC PRESENT, PREDOMINANTLY PMN NO ORGANISMS SEEN    Culture   Final    RARE METHICILLIN RESISTANT STAPHYLOCOCCUS AUREUS CRITICAL RESULT CALLED TO, READ BACK BY AND VERIFIED WITH: RN T.SHELTON AT 1211 ON 11/19/2020 BY T.SAAD Performed at [facility], [address] [place], [place] [address]    Report Status 11/21/2020 FINAL  Final   Organism ID, Bacteria METHICILLIN RESISTANT STAPHYLOCOCCUS AUREUS  Final      Susceptibility   Methicillin resistant staphylococcus aureus - MIC*    CIPROFLOXACIN <=0.5 SENSITIVE Sensitive     ERYTHROMYCIN >=8 RESISTANT Resistant     GENTAMICIN <=0.5 SENSITIVE Sensitive     OXACILLIN >=4 RESISTANT Resistant     TETRACYCLINE <=1 SENSITIVE Sensitive     VANCOMYCIN <=0.5 SENSITIVE Sensitive     TRIMETH/SULFA <=10 SENSITIVE Sensitive     CLINDAMYCIN <=0.25 SENSITIVE Sensitive     RIFAMPIN <=0.5 SENSITIVE Sensitive     Inducible Clindamycin NEGATIVE Sensitive     * RARE METHICILLIN RESISTANT STAPHYLOCOCCUS AUREUS  Culture, blood (Routine X 2) w Reflex to ID Panel     Status: None (Preliminary result)   Collection Time: 11/21/20  5:26 AM   Specimen: BLOOD RIGHT HAND  Result Value Ref Range Status   Specimen Description BLOOD RIGHT HAND  Final   Special Requests   Final    BOTTLES DRAWN AEROBIC ONLY Blood Culture adequate volume Performed at [facility], [address] [place], [place] [address]    Culture PENDING  Incomplete   Report Status PENDING  Incomplete  Culture, blood (Routine X 2) w Reflex to ID Panel     Status: None (Preliminary result)   Collection Time: 11/21/20  5:26 AM   Specimen: BLOOD LEFT HAND  Result Value Ref Range Status   Specimen Description BLOOD LEFT HAND  Final   Special Requests   Final    BOTTLES DRAWN AEROBIC ONLY Blood Culture adequate  volume Performed at [facility], [address] [place], [place] [address]    Culture PENDING  Incomplete   Report Status PENDING  Incomplete    Pertinent Lab. CBC Latest Ref Rng & Units 11/21/2020 11/20/2020 11/17/2020  WBC 4.0 - 10.5 K/uL 9.6 10.0 -  Hemoglobin 12.0 - 15.0 g/dL 7.8(L) 8.2(L) -  Hematocrit 36.0 - 46.0 % 23.3(L) 23.0(L) -  Platelets 150 - 400 K/uL 92(L) 67(L) 53(L)   CMP Latest Ref Rng & Units 11/21/2020 11/20/2020 11/19/2020  Glucose 70 - 99 mg/dL 578(I103(H) 93 73  BUN 6 - 20 mg/dL 69(G90(H) 29(B77(H) 28(U60(H)  Creatinine 0.44 - 1.00 mg/dL 1.32(G2.69(H) 4.01(U2.40(H) 2.72(Z1.87(H)  Sodium 135 - 145 mmol/L 131(L) 131(L) 130(L)  Potassium 3.5 - 5.1 mmol/L 5.2(H) 4.7 4.1  Chloride 98 - 111 mmol/L 102 101 103  CO2 22 - 32 mmol/L 17(L) 18(L) 17(L)  Calcium 8.9 - 10.3 mg/dL 6.7(L) 6.7(L) 7.2(L)  Total Protein 6.5 - 8.1 g/dL 6.0(L) - -  Total Bilirubin 0.3 - 1.2 mg/dL 1.2 - -  Alkaline Phos 38 - 126 U/L 89 - -  AST  15 - 41 U/L 18 - -  ALT 0 - 44 U/L 11 - -    Pertinent Imaging today Plain films and CT images have been personally visualized and interpreted; radiology reports have been reviewed. Decision making incorporated into the Impression / Recommendations.  I have spent approx 30 minutes for this patient encounter including review of prior medical records with greater than 50% of time being face to face and coordination of their care.  Electronically signed by:   Odette Fraction, MD Infectious Disease Physician [facility] Pager: [phone]

## 2020-11-21 NOTE — Progress Notes (Signed)
NAME:  Anne Bautista, MRN:  [id number], DOB:  03/11/1986, LOS: 5 ADMISSION DATE:  11/15/2020, CONSULTATION DATE:  02.14.2022 REFERRING MD:  Sunnie Nielsenegalado, CHIEF COMPLAINT:  Sepsis  Brief History:  35yo female presented with cough and myalgias, found to be bacteremic with known history of IVDU  History of Present Illness:  Anne Bautista is a 35yo female with PMH significant for IVDU, tobacco abuse, and malnutrition who presented to the ED with complaints of cough and [place].  She reported "everything hurts".  Patient reports the symptoms have been present for approximately 1 week prior to admission.  Also reports fever upwards of 101.5, nonproductive cough and generalized weakness with ambulatory dysfunction.  Patient reports long running history of IV drug abuse with recent relapse 3 days prior to admission.  She does report prior history of endocarditis with hospitalization approximately 4 years ago where she left AGAINST MEDICAL ADVICE.  On arrival patient was seen afebrile, tachycardic, and tachypneic.  Significant lab work included: Sodium 124, potassium 2.6, glucose 128, BUN 40, creatinine 1.33, albumin 2.1 lactic acid 3.6, WBC 15.3, platelets 51.  Toxicology positive for cocaine urinalysis positive for trace leukocytes and nitrates.  Admission chest x-ray significant for bilateral lung opacities likely consistent with residual scarring from pulmonary emboli.  CT abdomen and pelvis consistent with multiple subtle hypoenhancing lesions of the renal cortical consistent with septic emboli and splenomegaly.  CTA chest negative for PE but revealed numerous bilateral cavitary nodules throughout the lungs most consistent with septic emboli.  Patient was initially admitted to hospitalist team for further management and treatment of bacteremia.  However morning of 2/14 patient began developing worsening signs of severe sepsis including fever with T-max of 100.2, tachycardia with a heart rate of 140, tachypnea  with respiratory rates in the upper 40s to 50s.  Blood cultures positive for Staph aureus. PCCM was consulted for further management and patient transferred to the ICU on 02.14.2022.  Past Medical History:  IV drug abuse Tobacco abuse Malnutrition Endocarditis  Significant Hospital Events:  2/13 Admitted  Consults:  Infectious Disease Cardiothoracic Surgery  Procedures:  2/14 Right shoulder aspiration   Significant Diagnostic Tests:  CT abd/ pelvis 2/12> mult. Hypo-enhancing lesions renal cortices, suspect septic emboli; splenomegaly CT angio chest 2/12> no PE; bilateral, numerous cavitary nodules, likely septic emboli vs vasculitis vs metastatic disease; pleural effusions, trace right, small left CT head w/o contrast 2/12> brain appears normal; right maxillary sinus, possible rhinosinusitis  ECHO 2/14> positive for vegetation on tricuspid valve LE Dopplers 2/14> negative for DVT bilaterally R Shoulder Joint Aspiration>  Rare Staph aureus, replating  Micro Data:  Covid 2/12 > negative Blood cultures > positive for Staph aureus Urine culture 2/12 > positive Staph aureus R Shoulder aspiration> positive Staph aureus   Antimicrobials:  Cefepime 2/12 >2/13 Flagyl 2/13 >2/13 Vancomycin 2/12 >>2/16 Daptomycin 2/16>> Doxycycline 2/16>>   Interim History / Subjective:   No events overnight.  Patient laying in bed and ill appearing. She is tachypnic and diaphoretic with diffuse pain and apparently anxious. No documented BM overnight.    Objective   Blood pressure (!) 122/93, pulse (!) 105, temperature 98.3 F (36.8 C), temperature source Oral, resp. rate (!) 36, height 5\' 7"  (1.702 m), weight 77 kg, last menstrual period 11/10/2020, SpO2 96 %.        Intake/Output Summary (Last 24 hours) at 11/21/2020 0603 Last data filed at 11/21/2020 0400 Gross per 24 hour  Intake 2500.59 ml  Output 600 ml  Net 1900.59  ml   Filed Weights   11/16/20 1147 11/17/20 0515 11/17/20 1100   Weight: 71.5 kg 78 kg 77 kg    Examination: General: Ill-appearing young woman in bed with moderate acute anxiety. Diaphoresis  HENT: PERRL, poor dentition, dry lips with dried dark secretions on lips Lungs: scattered rales, more pronounced on the left than the right, elevated respiratory rate, cough with secretions Cardiovascular: Regular rhythm but tachcardic, no appreciable murmur, otherwise NSR on the monitor; Increased edema in upper and lower extremities, dorsalis pedal pulses 2+/3+. Cap refill <3 seconds Abdomen: Soft, tender, slightly distended, hypo-active bowel sounds Extremities:adequate ROM, except the left ankle and right shoulder, but difficult to access ROM. Skin: She has increased erythema and edema around her left lower foot without crepitus.   Neuro: Alert and oriented grossly to person, place, and situation, follows commands, better answers this am GU: purewick in place, incontinent around it Psych: anxiety, pain with exam,   Resolved Hospital Problem list     Assessment & Plan:  Severe Sepsis with MRSA bacteremia in the setting of IV drug use Endocarditis (hx of endocarditis w/ sub-optimal abx regimen (4 weeks, not 6)) Septic emboli to kidneys and lungs Patient is afebrile overnight, but remains tachycardic and tachypnic with diaphoresis. Repeat BCx growing GPC in 2/4 bottles. - Continue Daptomycin/ Doxycycline initated on 2/16 - Stop fluids due to hypervolemia today. Check renal function this AM. -MAP goal >65, currently at 113 (2/17) -trended lactic acid of 2.1 today. -monitor I/Os -O2 via [place] to keep sats > 90  Tachypnea: Respiraotry Distress: Patient has course breath sounds and bilateral rales with significantly elevated respiratory rates. CXR shows diffuse airspace opacities (worse on the left with stable nodular densities consistent with septic emboli. Sputum culture pending. Will continue to follow clinically and broaden antibiotics as needed.   IV drug  use, tobacco use, cocaine use, marijuana use Anxiety: Currently receiving dilaudid 1 mg po q 6 hr, oxy 10 mg po q 6 hrs, and dilaudid 1 mg po q 4 hrs. She is also receiving Seroquel 25 mg BID and Lorazepam 0.5-1 mg q 6 hours. She remains anxious with generalized pain. She does have significant nidus for pain at this time.  -IV dilaudid, IV ativan PRN -Scheduled PO Oxy IR, Scheduled PO Dilaudid -Seroquel 25mg  BID, consider increasing today. - Repeat EKG today to monitor QTc -will need to plan detox with suboxone in the future -Bowel regimen (consitpation-see below) -nicotine replacement therapy  Acute Thrombocytopenia  Acute Anemia  Possibly due to sepsis vs drug use. Spleen is enlarged on CT on 2/12. Hgb dropped from 10.1 to 8.2 in the last 4 days without overt signs of bleeding, likely dilutional component. Plts improving with sepsis treatment. She was started on DVT ppx with heparin yesterday - Will continue to follow closely.  - Transfusion goal Hgb <7, Plts < 10 or < 50 if bleeding.   AKI, Severe Sepsis and Nephrotoxic Medications: Hyperkalemia: Oliguria: We will continue to monitor closely. Has been receiving fluids. This was stopped this morning due to increased hypervolemia on exam, likely extravascular edema. She continues to have worsening kidney function with evidence of hyperkalemia and volume overload. Urine output questionable. - Will consider consulting nephrology today for possible RRT in the near future. - Failed fluid resuscitation - Will likely need foley to closely monitor urine output. - Consider lokelma for hyperkalemia, but she is constipated and has not had a BM since admission. - Cont to trend BUN/ creat  -  Avoid nephrotoxic medications  Right shoulder effusion, septic arthritis vs reactive arthritis  R shoulder aspirated, fluid evaluated. Septic joint possible even with WBCs in the 8K range, gram stain abundant WBCs, few neutrophils, grew staph. Possibly  inflammatory vs reactive as well. Culture grew MRSA. Ortho is following for possible washout once patient improves clinically.   -GS to evaluate for washout, cultures positive for staph, Ortho may washout joint once clinically improved. -IV daptomycin, PO doxycycline -pain management  Cellulitis on Left ankle Cellulitis is appears to be progressing since starting AB. Likely secondary to toxin release since starting AB. Will continue to follow. XR shows no signs of soft tissue gas. Physical exam negative for crepitus  - Continue dapto and doxy -pain management  Acute on chronic constipation: Very limited movement, chronic opioid use and current opioid use. Poor PO intake. No BM this admit and patient unable to recall last BM. - Will increase Senokot today to 2 pills daily and titrate up to 2 pils BID, Will switch to Dulcolax suppository, continue colace and miralax.  -Up to chair/ [place] [place]  Malnutrition Multiple likely etiologies including poor dentition, IVDU Currently Clear Liquid Diet, plan to advance to mechanical soft when patient is eating more -Dietician consult -PO meal supplementation (Ensure)  Best practice (evaluated daily)  Diet: Clears, then supplemented and advanced Pain/Anxiety/Delirium protocol (if indicated): Dilaudid, Oxy IR, Ativan VAP protocol (if indicated): NA DVT prophylaxis: Held (low platelets) GI prophylaxis: NA Glucose control: NA Mobility: Bed Disposition:ICU  Goals of Care:  Last date of multidisciplinary goals of care discussion: n/a Family and staff present: none Summary of discussion: none Follow up goals of care discussion due: none Code Status: FULL  Labs   CBC: Recent Labs  Lab 11/15/20 1337 11/17/20 0234 11/17/20 1019 11/20/20 0918  WBC 15.3* 8.2  --  10.0  HGB 13.0 10.1*  --  8.2*  HCT 36.4 27.9*  --  23.0*  MCV 77.1* 77.5*  --  77.7*  PLT 51* 40* 53* 67*    Basic Metabolic Panel: Recent Labs  Lab 11/15/20 1735 11/16/20 1037  11/16/20 1930 11/17/20 0234 11/18/20 0147 11/19/20 0241 11/20/20 0253  NA 124* 130* 128* 129* 130* 130* 131*  K 2.6* 2.5* 4.2 3.9 3.6 4.1 4.7  CL 90* 103 101 102 103 103 101  CO2 19* 16* 18* 19* 20* 17* 18*  GLUCOSE 128* 119* 131* 104* 99 73 93  BUN 40* 25* 22* 22* 31* 60* 77*  CREATININE 1.33* 0.84 0.92 0.84 1.07* 1.87* 2.40*  CALCIUM 7.7* 7.0* 7.4* 7.4* 7.3* 7.2* 6.7*  MG 1.8 1.9  --   --   --   --   --    GFR: Estimated Creatinine Clearance: 35 mL/min (A) (by C-G formula based on SCr of 2.4 mg/dL (H)). Recent Labs  Lab 11/15/20 1337 11/15/20 1845 11/16/20 1930 11/16/20 2234 11/17/20 0234 11/17/20 1019 11/17/20 1150 11/20/20 0918  PROCALCITON  --   --  1.97  --   --   --   --   --   WBC 15.3*  --   --   --  8.2  --   --  10.0  LATICACIDVEN  --    < > 3.6* 3.1*  --  2.7* 2.1*  --    < > = values in this interval not displayed.    Liver Function Tests: Recent Labs  Lab 11/15/20 1735 11/16/20 1037 11/17/20 1150  AST 23 27 19   ALT 12 11 11  ALKPHOS 74 71 73  BILITOT 0.9 1.7* 1.5*  PROT 6.5 5.7* 5.5*  ALBUMIN 2.1* 1.7* 1.3*   Recent Labs  Lab 11/15/20 1735  LIPASE 20   No results for input(s): AMMONIA in the last 168 hours.  ABG No results found for: PHART, PCO2ART, PO2ART, HCO3, TCO2, ACIDBASEDEF, O2SAT   Coagulation Profile: Recent Labs  Lab 11/17/20 1019  INR 1.4*  1.4*    Cardiac Enzymes: Recent Labs  Lab 11/15/20 1735 11/20/20 0253  CKTOTAL 43 280*    HbA1C: No results found for: HGBA1C  CBG: Recent Labs  Lab 11/15/20 1535 11/19/20 0710 11/19/20 0835  GLUCAP 145* 66* 78     Critical care time:       Chari Manning, D.O.  Internal Medicine Resident, PGY-2 [facility]  Pager: [phone] 7:30 AM, 11/21/2020

## 2020-11-21 NOTE — Plan of Care (Signed)
  Problem: Fluid Volume: Goal: Hemodynamic stability will improve Outcome: Progressing   Problem: Clinical Measurements: Goal: Diagnostic test results will improve Outcome: Progressing   Problem: Clinical Measurements: Goal: Cardiovascular complication will be avoided Outcome: Progressing   Problem: Coping: Goal: Level of anxiety will decrease Outcome: Progressing   Problem: Skin Integrity: Goal: Risk for impaired skin integrity will decrease Outcome: Progressing

## 2020-11-21 NOTE — Progress Notes (Signed)
     [address]       [place] [address]             [phone]       Patient remains critically ill. Creatinine continues to rise. Most recent blood cultures continue to grow MRSA.  Awaiting for patient to declare herself clinically before proceeding with angio VAC. We will continue to follow peripherally.  Kaiven Vester Keane Scrape

## 2020-11-22 LAB — POCT I-STAT 7, (LYTES, BLD GAS, ICA,H+H)
Acid-base deficit: 8 mmol/L — ABNORMAL HIGH (ref 0.0–2.0)
Bicarbonate: 17.9 mmol/L — ABNORMAL LOW (ref 20.0–28.0)
Calcium, Ion: 1.02 mmol/L — ABNORMAL LOW (ref 1.15–1.40)
HCT: 24 % — ABNORMAL LOW (ref 36.0–46.0)
Hemoglobin: 8.2 g/dL — ABNORMAL LOW (ref 12.0–15.0)
O2 Saturation: 95 %
Potassium: 5.5 mmol/L — ABNORMAL HIGH (ref 3.5–5.1)
Sodium: 135 mmol/L (ref 135–145)
TCO2: 19 mmol/L — ABNORMAL LOW (ref 22–32)
pCO2 arterial: 35.7 mmHg (ref 32.0–48.0)
pH, Arterial: 7.309 — ABNORMAL LOW (ref 7.350–7.450)
pO2, Arterial: 80 mmHg — ABNORMAL LOW (ref 83.0–108.0)

## 2020-11-22 LAB — ALBUMIN: Albumin: 1.5 g/dL — ABNORMAL LOW (ref 3.5–5.0)

## 2020-11-22 LAB — CULTURE, BLOOD (ROUTINE X 2): Culture: NO GROWTH

## 2020-11-22 LAB — BASIC METABOLIC PANEL
Anion gap: 10 (ref 5–15)
BUN: 93 mg/dL — ABNORMAL HIGH (ref 6–20)
CO2: 19 mmol/L — ABNORMAL LOW (ref 22–32)
Calcium: 6.8 mg/dL — ABNORMAL LOW (ref 8.9–10.3)
Chloride: 106 mmol/L (ref 98–111)
Creatinine, Ser: 2.76 mg/dL — ABNORMAL HIGH (ref 0.44–1.00)
GFR, Estimated: 22 mL/min — ABNORMAL LOW (ref >60)
Glucose, Bld: 80 mg/dL (ref 70–99)
Potassium: 5.6 mmol/L — ABNORMAL HIGH (ref 3.5–5.1)
Sodium: 135 mmol/L (ref 135–145)

## 2020-11-22 MED ORDER — OXYCODONE HCL 5 MG PO TABS
5.0000 mg | ORAL_TABLET | Freq: Four times a day (QID) | ORAL | Status: DC | PRN
  Administered 2020-11-23 – 2020-12-28 (×43): 5 mg via ORAL
  Filled 2020-11-22 (×43): qty 1

## 2020-11-22 MED ORDER — METHADONE HCL 10 MG PO TABS
10.0000 mg | ORAL_TABLET | Freq: Three times a day (TID) | ORAL | Status: DC
  Administered 2020-11-22 – 2020-12-23 (×92): 10 mg via ORAL
  Filled 2020-11-22 (×24): qty 1
  Filled 2020-11-22 (×2): qty 2
  Filled 2020-11-22 (×2): qty 1
  Filled 2020-11-22: qty 2
  Filled 2020-11-22 (×45): qty 1
  Filled 2020-11-22: qty 2
  Filled 2020-11-22 (×6): qty 1
  Filled 2020-11-22: qty 2
  Filled 2020-11-22 (×2): qty 1
  Filled 2020-11-22: qty 2
  Filled 2020-11-22 (×7): qty 1

## 2020-11-22 MED ORDER — SODIUM ZIRCONIUM CYCLOSILICATE 5 G PO PACK
20.0000 g | PACK | Freq: Two times a day (BID) | ORAL | Status: AC
  Administered 2020-11-22 (×2): 20 g via ORAL
  Filled 2020-11-22 (×2): qty 4

## 2020-11-22 MED ORDER — METHADONE HCL 5 MG PO TABS: 10.0000 mg | ORAL_TABLET | Freq: Three times a day (TID) | ORAL | Status: DC

## 2020-11-22 MED ORDER — OXYCODONE HCL 5 MG PO TABS: 5.0000 mg | ORAL_TABLET | Freq: Four times a day (QID) | ORAL | Status: DC

## 2020-11-22 MED ORDER — ALBUMIN HUMAN 25 % IV SOLN: 25.0000 g | Freq: Four times a day (QID) | INTRAVENOUS | Status: DC

## 2020-11-22 NOTE — Progress Notes (Signed)
ABG drawn and results shown to Dr. Delton Coombes. No new orders received at this time. RT will continue to monitor and be available as needed.

## 2020-11-22 NOTE — Progress Notes (Signed)
NAME:  Anne Bautista, MRN:  [id number], DOB:  07/31/1986, LOS: 6 ADMISSION DATE:  11/15/2020, CONSULTATION DATE:  02.14.2022 REFERRING MD:  Sunnie Nielsenegalado, CHIEF COMPLAINT:  Sepsis  Brief History:  35yo female presented with cough and myalgias, found to be bacteremic with known history of IVDU  History of Present Illness:  Anne Bautista is a 35yo female with PMH significant for IVDU, tobacco abuse, and malnutrition who presented to the ED with complaints of cough and [place].  She reported "everything hurts".  Patient reports the symptoms have been present for approximately 1 week prior to admission.  Also reports fever upwards of 101.5, nonproductive cough and generalized weakness with ambulatory dysfunction.  Patient reports long running history of IV drug abuse with recent relapse 3 days prior to admission.  She does report prior history of endocarditis with hospitalization approximately 4 years ago where she left AGAINST MEDICAL ADVICE.  On arrival patient was seen afebrile, tachycardic, and tachypneic.  Significant lab work included: Sodium 124, potassium 2.6, glucose 128, BUN 40, creatinine 1.33, albumin 2.1 lactic acid 3.6, WBC 15.3, platelets 51.  Toxicology positive for cocaine urinalysis positive for trace leukocytes and nitrates.  Admission chest x-ray significant for bilateral lung opacities likely consistent with residual scarring from pulmonary emboli.  CT abdomen and pelvis consistent with multiple subtle hypoenhancing lesions of the renal cortical consistent with septic emboli and splenomegaly.  CTA chest negative for PE but revealed numerous bilateral cavitary nodules throughout the lungs most consistent with septic emboli.  Patient was initially admitted to hospitalist team for further management and treatment of bacteremia.  However morning of 2/14 patient began developing worsening signs of severe sepsis including fever with T-max of 100.2, tachycardia with a heart rate of 140, tachypnea  with respiratory rates in the upper 40s to 50s.  Blood cultures positive for Staph aureus. PCCM was consulted for further management and patient transferred to the ICU on 02.14.2022.  Past Medical History:  IV drug abuse Tobacco abuse Malnutrition Endocarditis  Significant Hospital Events:  2/13 Admitted  Consults:  Infectious Disease Cardiothoracic Surgery  Procedures:  2/14 Right shoulder aspiration   Significant Diagnostic Tests:  CT abd/ pelvis 2/12> mult. Hypo-enhancing lesions renal cortices, suspect septic emboli; splenomegaly CT angio chest 2/12> no PE; bilateral, numerous cavitary nodules, likely septic emboli vs vasculitis vs metastatic disease; pleural effusions, trace right, small left CT head w/o contrast 2/12> brain appears normal; right maxillary sinus, possible rhinosinusitis  ECHO 2/14> positive for vegetation on tricuspid valve LE Dopplers 2/14> negative for DVT bilaterally R Shoulder Joint Aspiration>  Rare Staph aureus, replating  Micro Data:  Covid 2/12 > negative Blood cultures > positive for Staph aureus Urine culture 2/12 > positive Staph aureus R Shoulder aspiration> positive Staph aureus   Antimicrobials:  Cefepime 2/12 >2/13 Flagyl 2/13 >2/13 Vancomycin 2/12 >>2/16 Daptomycin 2/16>> Doxycycline 2/16>>   Interim History / Subjective:   Received albumin x1 overnight Labs this morning pending   Objective   Blood pressure (!) 138/95, pulse (!) 110, temperature 98.4 F (36.9 C), temperature source Axillary, resp. rate (!) 30, height 5\' 7"  (1.702 m), weight 77 kg, last menstrual period 11/10/2020, SpO2 96 %.        Intake/Output Summary (Last 24 hours) at 11/22/2020 0737 Last data filed at 11/22/2020 0600 Gross per 24 hour  Intake 1009.36 ml  Output 750 ml  Net 259.36 ml   Filed Weights   11/16/20 1147 11/17/20 0515 11/17/20 1100  Weight: 71.5 kg 78 kg  77 kg    Examination: General: Acute and chronically ill-appearing woman,  tachypneic, occasionally sleeping, occasionally moaning HENT: Oropharynx dry, poor dentition, pupils are equal Lungs: Scattered rhonchi and rales Cardiovascular: Regular, no murmur Abdomen: Mildly distended, tympanic, positive bowel sounds Extremities: 1+ bilateral pretibial edema Skin: Erythema and edema on the anterior distal left lower extremity, ankle, unchanged Neuro: Alert and oriented grossly to person, place, and situation, follows commands, better answers this am  Resolved Hospital Problem list     Assessment & Plan:  Severe Sepsis with MRSA bacteremia in the setting of IV drug use Endocarditis (hx of endocarditis w/ sub-optimal abx regimen (4 weeks, not 6)) Septic emboli to kidneys and lungs Patient is afebrile overnight, but remains tachycardic and tachypnic with diaphoresis. Repeat BCx growing GPC in 2/4 bottles. -Daptomycin, doxycycline.  Doxycycline will end on 2/20 -Appreciate TCTS evaluation -Follow respiratory status, chest x-ray  Acute respiratory failure, tachypnea.  In part due to narcotics withdrawal superimposed on bilateral embolic pneumonias -Supportive care -Antibiotics as above -Follow chest x-ray -Support her through her narcotics withdrawal -at risk intubation and MV  IV drug use, tobacco use, cocaine use, marijuana use Anxiety: Currently receiving dilaudid 1 mg po q 6 hr, oxy 10 mg po q 6 hrs, and dilaudid 1 mg po q 4 hrs. She is also receiving Seroquel 25 mg BID and Lorazepam 0.5-1 mg q 6 hours.  More somnolent on 2/19 -Scheduled oxycodone, plan change to 5 mg every 6 hours prn -Methadone started 2/18 -Seroquel 25 mg twice daily, consider increase -Bowel regimen -Nicotine replacement therapy  Acute Thrombocytopenia  Acute Anemia  Possibly due to sepsis vs drug use. Spleen is enlarged on CT on 2/12. Hgb dropped from 10.1 to 8.2 in the last 4 days without overt signs of bleeding, likely dilutional component. Plts improving with sepsis treatment. She  was started on DVT ppx with heparin yesterday -Continue to follow CBC -Hemoglobin goal 7.0, platelet goal 10K or 50 K if any evidence of blood loss  AKI, Severe Sepsis and Nephrotoxic Medications: Hyperkalemia: Hyponatremia Oliguria: We will continue to monitor closely. Has been receiving fluids. This was stopped this morning due to increased hypervolemia on exam, likely extravascular edema. She continues to have worsening kidney function with evidence of hyperkalemia and volume overload. Urine output questionable. -Appreciate nephrology assistance -Follow urine output, BMP -Redose Lokelma if hyperkalemia persists -Renal dose medications -Perfusion pressures are adequate, DC albumin  Right shoulder effusion, septic arthritis vs reactive arthritis  R shoulder aspirated, fluid evaluated. Septic joint possible even with WBCs in the 8K range, gram stain abundant WBCs, few neutrophils, grew staph. Possibly inflammatory vs reactive as well. Culture grew MRSA. Ortho is following for possible washout once patient improves clinically.   -Appreciate Ortho assistance.  No surgical intervention at this time -Antibiotics as above Pain control  Cellulitis on Left ankle Cellulitis is appears to be progressing since starting AB. Likely secondary to toxin release since starting AB. Will continue to follow. XR shows no signs of soft tissue gas. Physical exam negative for crepitus  -Follow area of erythema on current antibiotic regimen  Acute on chronic constipation: Very limited movement, chronic opioid use and current opioid use. Poor PO intake. No BM this admit and patient unable to recall last BM. -Senna, Dulcolax suppository, MiraLAX, Colace -Mobilize as able  Malnutrition Multiple likely etiologies including poor dentition, IVDU Currently Clear Liquid Diet, plan to advance to mechanical soft when patient is eating more -Clear diet with supplementation  Best  practice (evaluated daily)  Diet:  Clears, then supplemented and advanced Pain/Anxiety/Delirium protocol (if indicated): Dilaudid, Oxy IR, Ativan VAP protocol (if indicated): NA DVT prophylaxis: Held (low platelets) GI prophylaxis: NA Glucose control: NA Mobility: Bed Disposition:ICU  Goals of Care:  Last date of multidisciplinary goals of care discussion: n/a Family and staff present: none Summary of discussion: none Follow up goals of care discussion due: none Code Status: FULL Family: have updated fiance, no immediate family currently available.   Labs   CBC: Recent Labs  Lab 11/15/20 1337 11/17/20 0234 11/17/20 1019 11/20/20 0918 11/21/20 0607  WBC 15.3* 8.2  --  10.0 9.6  HGB 13.0 10.1*  --  8.2* 7.8*  HCT 36.4 27.9*  --  23.0* 23.3*  MCV 77.1* 77.5*  --  77.7* 79.3*  PLT 51* 40* 53* 67* 92*    Basic Metabolic Panel: Recent Labs  Lab 11/15/20 1735 11/16/20 1037 11/16/20 1930 11/18/20 0147 11/19/20 0241 11/20/20 0253 11/21/20 0526 11/21/20 1500  NA 124* 130*   < > 130* 130* 131* 131* 131*  K 2.6* 2.5*   < > 3.6 4.1 4.7 5.2* 5.7*  CL 90* 103   < > 103 103 101 102 103  CO2 19* 16*   < > 20* 17* 18* 17* 18*  GLUCOSE 128* 119*   < > 99 73 93 103* 100*  BUN 40* 25*   < > 31* 60* 77* 90* 89*  CREATININE 1.33* 0.84   < > 1.07* 1.87* 2.40* 2.69* 2.63*  CALCIUM 7.7* 7.0*   < > 7.3* 7.2* 6.7* 6.7* 6.8*  MG 1.8 1.9  --   --   --   --   --   --    < > = values in this interval not displayed.   GFR: Estimated Creatinine Clearance: 32 mL/min (A) (by C-G formula based on SCr of 2.63 mg/dL (H)). Recent Labs  Lab 11/15/20 1337 11/15/20 1845 11/16/20 1930 11/16/20 2234 11/17/20 0234 11/17/20 1019 11/17/20 1150 11/20/20 0918 11/21/20 0526 11/21/20 0607  PROCALCITON  --   --  1.97  --   --   --   --   --   --   --   WBC 15.3*  --   --   --  8.2  --   --  10.0  --  9.6  LATICACIDVEN  --    < > 3.6* 3.1*  --  2.7* 2.1*  --  0.8  --    < > = values in this interval not displayed.    Liver  Function Tests: Recent Labs  Lab 11/15/20 1735 11/16/20 1037 11/17/20 1150 11/21/20 0526  AST 23 27 19 18   ALT 12 11 11 11   ALKPHOS 74 71 73 89  BILITOT 0.9 1.7* 1.5* 1.2  PROT 6.5 5.7* 5.5* 6.0*  ALBUMIN 2.1* 1.7* 1.3* 1.1*   Recent Labs  Lab 11/15/20 1735  LIPASE 20   No results for input(s): AMMONIA in the last 168 hours.  ABG No results found for: PHART, PCO2ART, PO2ART, HCO3, TCO2, ACIDBASEDEF, O2SAT   Coagulation Profile: Recent Labs  Lab 11/17/20 1019  INR 1.4*  1.4*    Cardiac Enzymes: Recent Labs  Lab 11/15/20 1735 11/20/20 0253  CKTOTAL 43 280*    HbA1C: No results found for: HGBA1C  CBG: Recent Labs  Lab 11/15/20 1535 11/19/20 0710 11/19/20 0835  GLUCAP 145* 66* 78     Critical care time: 35 min    11/21/20  Delton Coombes, MD, PhD 11/22/2020, 10:10 AM  Pulmonary and Critical Care [phone] or if no answer before 7:00PM call [phone] For any issues after 7:00PM please call eLink [phone]

## 2020-11-22 NOTE — Evaluation (Signed)
Clinical/Bedside Swallow Evaluation Patient Details  Name: Anne Bautista MRN: [id number] Date of Birth: [birth year]  Today's Date: 11/22/2020 Time: SLP Start Time (ACUTE ONLY): 0950 SLP Stop Time (ACUTE ONLY): 1015 SLP Time Calculation (min) (ACUTE ONLY): 25 min  Past Medical History:  Past Medical History:  Diagnosis Date  . IV drug user   . Malnutrition (HCC) 11/16/2020  . Poor dentition 11/16/2020  . Tobacco dependence 11/16/2020   Past Surgical History: History reviewed. No pertinent surgical history. HPI:  35 yo admitted 2/14 with sepsis due to MRSA bacteremia in setting of IVDU. Rt shoulder pain s/p aspiration 2/14. PMHx: IVDU, malnutrition, poor dentition.  Most recent CXR showed :  Stable bilateral rounded nodular densities are noted throughout both  lungs concerning for septic emboli or possibly metastatic disease.  Increased left basilar opacity is noted concerning for worsening  pneumonia with possible associated pleural effusion.  CT head Air-fluid level in the right maxillary sinus suggesting  rhinosinusitis.   Assessment / Plan / Recommendation Clinical Impression  Pt currently sleepy but did awaken adequately to accept single ice chips.  She reports pain in back and throat and pharynx is obviously erythemic.  Pt provided with oral care via toothbrush and oral suctioning.  Congested breathing at baseline which pt is unable to clear with cued and reflexive cough/hock.  She was provided with single ice chips after oral care - pt masticates while making child like mastication noises.  She has tachypnea which did not decrease during session- up to 32 with shallow respirations.  When she awakens marginally, she is severely dysarthric and does not consistently follow directions.  Recommend NPO x single small ice chips when fully alert.  Advised CCM that pt was reporting phayrngeal pain and was erythemic. SLP Visit Diagnosis: Dysphagia, oropharyngeal phase (R13.12)    Aspiration Risk  Severe  aspiration risk    Diet Recommendation Other (Comment);Ice chips PRN after oral care   Liquid Administration via: Spoon Medication Administration: Via alternative means Compensations: Slow rate;Small sips/bites Postural Changes: Seated upright at 90 degrees    Other  Recommendations Oral Care Recommendations: Oral care QID   Follow up Recommendations  (TBD)      Frequency and Duration min 2x/week  2 weeks       Prognosis Prognosis for Safe Diet Advancement: Fair      Swallow Study   General Date of Onset: 11/22/20 HPI: 35 yo admitted 2/14 with sepsis due to MRSA bacteremia in setting of IVDU. Rt shoulder pain s/p aspiration 2/14. PMHx: IVDU, malnutrition, poor dentition.  Most recent CXR showed :  Stable bilateral rounded nodular densities are noted throughout both  lungs concerning for septic emboli or possibly metastatic disease.  Increased left basilar opacity is noted concerning for worsening  pneumonia with possible associated pleural effusion.  CT head Air-fluid level in the right maxillary sinus suggesting  rhinosinusitis. Diet Prior to this Study: Thin liquids (clears) Temperature Spikes Noted: No Respiratory Status: Nasal cannula History of Recent Intubation: No Behavior/Cognition: Requires cueing;Doesn't follow directions;Lethargic/Drowsy Oral Cavity Assessment: Other (comment) (posterior pharynx erythemic) Oral Care Completed by SLP: Yes Oral Cavity - Dentition: Adequate natural dentition Vision: Functional for self-feeding Self-Feeding Abilities: Able to feed self Patient Positioning: Upright in bed Baseline Vocal Quality: Low vocal intensity;Other (comment) (wet-congested - unable to clear) Volitional Swallow: Unable to elicit    Oral/Motor/Sensory Function Overall Oral Motor/Sensory Function: Generalized oral weakness   Ice Chips Ice chips: Impaired Presentation: Spoon Pharyngeal Phase Impairments: Suspected delayed Swallow;Cough -  Immediate;Cough - Delayed    Thin Liquid Thin Liquid: Not tested    Nectar Thick Nectar Thick Liquid: Not tested   Honey Thick Honey Thick Liquid: Not tested   Puree Puree: Not tested   Solid     Solid: Not tested      Chales Abrahams 11/22/2020,10:30 AM  Rolena Infante, MS [facility] SLP Acute Rehab Services Office [phone] Pager [phone]

## 2020-11-22 NOTE — Progress Notes (Addendum)
Nephrology Follow-Up Consult note   Assessment/Recommendations: Anne Bautista is a/an 35 y.o. female with a past medical history significant for IVDU, tobacco use disorder, malnutrition who present w/ likely endocarditis with sepsis  Non-Oliguric AKI: Creatinine on arrival 1.3 improved to 0.8.  Creatinine rising since 2/15.    Urinalysis with minimal proteinuria and no significant hematuria making infection associated GN less likely.  More likely a mix of ATN from sepsis and septic embolization as well as contrast associated injury. Slight improvement to 2.6 today.  -Care at this time is supportive -Continue to monitor daily Cr, Dose meds for GFR -Monitor Daily I/Os, Daily weight  -Maintain MAP>65 for optimal renal perfusion.  -Avoid nephrotoxic medications including NSAIDs and Vanc/Zosyn combo -Currently no indication for HD  Sepsis/MRSA bacteremia/endocarditis: Antibiotics and further management per primary team and infectious disease.  Blood pressure acceptable at this time  Anasarca: Likely related to hypoalbuminemia which is secondary to acute on chronic infection causing negative acute phase reactant.  Malnutrition also contributing.  Would recommend to continue to administer albumin with goal of at least serum albumin of 2 but primary team wishes to hold today. Will CTM  Hyperkalemia: Persistent elevation despite Lokelma yesterday.  Redose Lokelma today.  Continue to monitor  Anemia: Multifactorial with kidney disease contributing minimally.  No indication for ESA at this time.  Continue to monitor  Hypoxic respiratory failure: Minimal oxygen requirement at this time.  Continue to monitor per primary team  Severe malnutrition: Dietary involved.  Hyponatremia: Sodium 131.  Likely related to volume excess w/ AKI.  Continue to monitor   Recommendations conveyed to primary service.    [name] [facility] 11/22/2020 10:32  AM  ___________________________________________________________  CC: AKI  Interval History/Subjective: Fairly good urine output over the past 24 hours with greater than 750 cc.  Creatinine slightly improved to 2.6.  Patient slightly more awake today able to tell me where she is.  Hepatitis C positive.   Medications:  Current Facility-Administered Medications  Medication Dose Route Frequency Provider Last Rate Last Admin  . 0.9 %  sodium chloride infusion   Intravenous PRN Steffanie Dunn, DO   Stopped at 11/18/20 1145  . acetaminophen (TYLENOL) tablet 650 mg  650 mg Oral Q6H PRN Jonah Blue, MD   650 mg at 11/20/20 2224   Or  . acetaminophen (TYLENOL) suppository 650 mg  650 mg Rectal Q6H PRN Jonah Blue, MD      . bisacodyl (DULCOLAX) suppository 10 mg  10 mg Rectal Daily PRN Dellia Cloud, MD      . chlorhexidine (PERIDEX) 0.12 % solution 15 mL  15 mL Mouth Rinse BID Karie Fetch P, DO   15 mL at 11/22/20 0900  . Chlorhexidine Gluconate Cloth 2 % PADS 6 each  6 each Topical Q0600 Erisa, Mehlman, DO   6 each at 11/21/20 1033  . DAPTOmycin (CUBICIN) 770 mg in sodium chloride 0.9 % IVPB  10 mg/kg Intravenous Q2000 Odette Fraction, MD   Stopped at 11/21/20 1950  . docusate sodium (COLACE) capsule 100 mg  100 mg Oral BID Max Fickle B, MD   100 mg at 11/22/20 0900  . doxycycline (VIBRA-TABS) tablet 100 mg  100 mg Oral Q12H Odette Fraction, MD   100 mg at 11/22/20 0900  . feeding supplement (ENSURE ENLIVE / ENSURE PLUS) liquid 237 mL  237 mL Oral TID BM Karie Fetch P, DO   237 mL at 11/22/20 0900  . folic acid (FOLVITE)  tablet 1 mg  1 mg Oral Daily Jonah Blue, MD   1 mg at 11/22/20 0900  . heparin injection 5,000 Units  5,000 Units Subcutaneous Q8H Leslye Peer, MD   5,000 Units at 11/22/20 [phone]  . HYDROmorphone (DILAUDID) injection 1 mg  1 mg Intravenous Q4H PRN Leslye Peer, MD   1 mg at 11/21/20 1916  . LORazepam (ATIVAN) injection 0.5-1 mg  0.5-1 mg Intravenous  Q6H PRN Lupita Leash, MD   1 mg at 11/22/20 0045  . MEDLINE mouth rinse  15 mL Mouth Rinse q12n4p Jessenia, Filippone P, DO   15 mL at 11/21/20 1516  . methadone (DOLOPHINE) tablet 10 mg  10 mg Oral Q8H Byrum, Les Pou, MD      . metoprolol tartrate (LOPRESSOR) injection 2.5 mg  2.5 mg Intravenous Q6H PRN Regalado, Belkys A, MD   2.5 mg at 11/20/20 1934  . multivitamin with minerals tablet 1 tablet  1 tablet Oral Daily Odette Fraction, MD   1 tablet at 11/21/20 1712  . nicotine (NICODERM CQ - dosed in mg/24 hours) patch 14 mg  14 mg Transdermal Daily PRN Jonah Blue, MD      . oxyCODONE (Oxy IR/ROXICODONE) immediate release tablet 5 mg  5 mg Oral Q6H PRN Leslye Peer, MD      . polyethylene glycol (MIRALAX / GLYCOLAX) packet 17 g  17 g Oral BID Max Fickle B, MD   17 g at 11/22/20 0900  . QUEtiapine (SEROQUEL) tablet 25 mg  25 mg Oral BID Dellia Cloud, MD   25 mg at 11/22/20 0900  . senna (SENOKOT) tablet 17.2 mg  2 tablet Oral Daily Dellia Cloud, MD   17.2 mg at 11/22/20 0900  . sodium chloride flush (NS) 0.9 % injection 10-40 mL  10-40 mL Intracatheter Q12H Leslye Peer, MD   10 mL at 11/21/20 2138  . sodium chloride flush (NS) 0.9 % injection 10-40 mL  10-40 mL Intracatheter PRN Leslye Peer, MD      . sodium chloride flush (NS) 0.9 % injection 3 mL  3 mL Intravenous Q12H Jonah Blue, MD   3 mL at 11/21/20 0947  . sodium zirconium cyclosilicate (LOKELMA) packet 20 g  20 g Oral BID [name], MD   20 g at 11/22/20 0900  . thiamine tablet 100 mg  100 mg Oral Daily Jonah Blue, MD   100 mg at 11/22/20 0900      Review of Systems: 10 systems reviewed and negative except per interval history/subjective  Physical Exam: Vitals:   11/22/20 0800 11/22/20 0900  BP: 132/78 132/87  Pulse: (!) 108 (!) 107  Resp: (!) 34 (!) 36  Temp:    SpO2: 97% 97%   No intake/output data recorded.  Intake/Output Summary (Last 24 hours) at 11/22/2020 1032 Last data filed at  11/22/2020 0800 Gross per 24 hour  Intake 759.36 ml  Output 750 ml  Net 9.36 ml   General: Ill-appearing, lying in bed, mild distress HEENT: anicteric sclera, oropharynx clear without lesions, dry mucus membranes CV: Tachycardia, regular rhythm, anasarca with lower extremity pitting edema 1+ Lungs: Coarse bilateral breath sounds, tachypneic, mild increased work of breathing Abd: soft, non-tender, non-distended Skin: Erythema of left shin, otherwise no visible lesions or rashes Psych: Awake, alert, minimally conversant Musculoskeletal: no obvious deformities   Test Results I personally reviewed new and old clinical labs and radiology tests Lab Results  Component Value Date   NA  131 (L) 11/21/2020   K 5.7 (H) 11/21/2020   CL 103 11/21/2020   CO2 18 (L) 11/21/2020   BUN 89 (H) 11/21/2020   CREATININE 2.63 (H) 11/21/2020   CALCIUM 6.8 (L) 11/21/2020   ALBUMIN 1.1 (L) 11/21/2020

## 2020-11-23 ENCOUNTER — Inpatient Hospital Stay (HOSPITAL_COMMUNITY): Payer: Self-pay

## 2020-11-23 DIAGNOSIS — N189 Chronic kidney disease, unspecified: Secondary | ICD-10-CM

## 2020-11-23 DIAGNOSIS — E440 Moderate protein-calorie malnutrition: Secondary | ICD-10-CM | POA: Insufficient documentation

## 2020-11-23 DIAGNOSIS — D72829 Elevated white blood cell count, unspecified: Secondary | ICD-10-CM

## 2020-11-23 DIAGNOSIS — N179 Acute kidney failure, unspecified: Secondary | ICD-10-CM

## 2020-11-23 DIAGNOSIS — R161 Splenomegaly, not elsewhere classified: Secondary | ICD-10-CM

## 2020-11-23 DIAGNOSIS — M009 Pyogenic arthritis, unspecified: Secondary | ICD-10-CM

## 2020-11-23 DIAGNOSIS — M25572 Pain in left ankle and joints of left foot: Secondary | ICD-10-CM

## 2020-11-23 LAB — RENAL FUNCTION PANEL
Albumin: 1.4 g/dL — ABNORMAL LOW (ref 3.5–5.0)
Anion gap: 11 (ref 5–15)
BUN: 90 mg/dL — ABNORMAL HIGH (ref 6–20)
CO2: 17 mmol/L — ABNORMAL LOW (ref 22–32)
Calcium: 6.9 mg/dL — ABNORMAL LOW (ref 8.9–10.3)
Chloride: 105 mmol/L (ref 98–111)
Creatinine, Ser: 2.73 mg/dL — ABNORMAL HIGH (ref 0.44–1.00)
GFR, Estimated: 23 mL/min — ABNORMAL LOW (ref >60)
Glucose, Bld: 95 mg/dL (ref 70–99)
Phosphorus: 8.7 mg/dL — ABNORMAL HIGH (ref 2.5–4.6)
Potassium: 5 mmol/L (ref 3.5–5.1)
Sodium: 133 mmol/L — ABNORMAL LOW (ref 135–145)

## 2020-11-23 LAB — CBC
HCT: 24.9 % — ABNORMAL LOW (ref 36.0–46.0)
Hemoglobin: 8.2 g/dL — ABNORMAL LOW (ref 12.0–15.0)
MCH: 26.9 pg (ref 26.0–34.0)
MCHC: 32.9 g/dL (ref 30.0–36.0)
MCV: 81.6 fL (ref 80.0–100.0)
Platelets: 175 10*3/uL (ref 150–400)
RBC: 3.05 MIL/uL — ABNORMAL LOW (ref 3.87–5.11)
RDW: 16.5 % — ABNORMAL HIGH (ref 11.5–15.5)
WBC: 11.8 10*3/uL — ABNORMAL HIGH (ref 4.0–10.5)
nRBC: 0 % (ref 0.0–0.2)

## 2020-11-23 LAB — GLUCOSE, CAPILLARY: Glucose-Capillary: 113 mg/dL — ABNORMAL HIGH (ref 70–99)

## 2020-11-23 LAB — MAGNESIUM: Magnesium: 2.7 mg/dL — ABNORMAL HIGH (ref 1.7–2.4)

## 2020-11-23 LAB — CK: Total CK: 45 U/L (ref 38–234)

## 2020-11-23 MED ORDER — CHLORHEXIDINE GLUCONATE CLOTH 2 % EX PADS
6.0000 | MEDICATED_PAD | Freq: Every day | CUTANEOUS | Status: DC
  Administered 2020-11-23 – 2020-12-29 (×31): 6 via TOPICAL

## 2020-11-23 NOTE — Progress Notes (Addendum)
[facility] Infectious Diseases Follow Up Note  Patient Identification: Patient Name: Anne CoulterLaura Clegg MRN: [id number] Admit Date: 11/15/2020  3:21 PM Age: 35 y.o.Today's Date: 11/23/2020   Reason for Visit: MRSA bacteremia   Principal Problem:   Severe sepsis due to methicillin resistant Staphylococcus aureus (MRSA) with acute organ dysfunction [facility](HCC) Active Problems:   IV drug abuse (HCC)   Poor dentition   Malnutrition (HCC)   Thrombocytopenia (HCC)   Tobacco dependence   Acute cystitis without hematuria   Malnutrition of moderate degree  Antibiotics:Cefepime 2/12-2/13 Vancomycin 2/12- 2/15 Daptomycin 2/16- Doxycyline 2/16-  Lines/Tubes:PIVs, urinary catheter  Interval Events: continues to remain afebrile,  mild leukocytosis, stays on Farley, mental status with some improvement   Assessment # 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 No concerns of septic joint per Ortho  # IVDU # Withdrawals  # Thrombocytoenia - Improving, in the setting of sepsis and endocarditis # AKIon CKD # Medication monitoring   Recommendations Continue Daptomycin, CPK 45 Finish 5 day course of doxycycline today  Fu repeat blood cx from 2/18 for clearance Noted plan for debridement from CT sx once she recovers from sepsis. Surgical intervention for Rt shoulder for possible wash out per Ortho Defer need of TEE to CT sx  Monitor CBC, BMP and CPK  Rest of the management as per the primary team. Thank you for the consult. Please page with pertinent questions or  concerns.  ______________________________________________________________________ Subjective patient seen and examined at the bedside. She seems to be less moaning today. Still on nasal cannula. Says pain in the left ankle is same  Vitals BP 129/75   Pulse (!) 123   Temp 98.2 F (36.8 C) (Oral)   Resp (!) 33   Ht 5\' 7"  (1.702 m)   Wt 77 kg   LMP 11/10/2020   SpO2 96%   BMI 26.59 kg/m     Physical Exam  drowsy on Nasal cannula HEENT - oral mucosa very dry Chest - coarse breath sounds CVS-Normal s1s2, RRR systolic murmur  Abdomen - soft, NT Extremities - left ankle and foot swelling/redness  Pertinent Microbiology Results for orders placed or performed during the hospital encounter of 11/15/20  Resp Panel by RT-PCR (Flu A&B, Covid) Peripheral     Status: None   Collection Time: 11/15/20  4:10 PM   Specimen: Peripheral; Nasopharyngeal(NP) swabs in vial transport medium  Result Value Ref Range Status   SARS Coronavirus 2 by RT PCR NEGATIVE NEGATIVE Final    Comment: (NOTE) SARS-CoV-2 target nucleic acids are NOT DETECTED.  The SARS-CoV-2 RNA is generally detectable in upper respiratory specimens during the acute phase of infection. The lowest concentration of SARS-CoV-2 viral copies this assay can detect is 138 copies/mL. A negative result does not preclude SARS-Cov-2 infection and should not be used as the sole basis for treatment or other patient management decisions. A negative result may occur with  improper specimen collection/handling, submission of specimen other than nasopharyngeal swab, presence of viral mutation(s) within the areas targeted by this assay, and inadequate number of viral copies(<138 copies/mL). A negative result must be combined with clinical observations, patient history, and epidemiological information. The expected result is Negative.  Fact Sheet for Patients:  BloggerCourse.comhttps://www.fda.gov/media/152166/download  Fact Sheet for Healthcare Providers:  SeriousBroker.it  This test is no t yet approved or cleared by the [place] and  has been authorized for detection and/or diagnosis of SARS-CoV-2 by FDA under an Emergency Use Authorization (EUA). This EUA will remain  in effect (meaning this test can be used) for the duration of the COVID-19 declaration under Section 564(b)(1) of the Act, 21 U.S.C.section 360bbb-3(b)(1), unless the authorization is terminated  or revoked sooner.       Influenza A by PCR NEGATIVE NEGATIVE Final   Influenza B by PCR NEGATIVE NEGATIVE Final    Comment: (NOTE) The Xpert Xpress SARS-CoV-2/FLU/RSV plus assay is intended as an aid in the diagnosis of influenza from Nasopharyngeal swab specimens and should not be used as a sole basis for treatment. Nasal washings and aspirates are unacceptable for Xpert Xpress SARS-CoV-2/FLU/RSV testing.  Fact Sheet for Patients: BloggerCourse.com  Fact Sheet for Healthcare Providers: SeriousBroker.it  This test is not yet approved or cleared by the [place] FDA and has been authorized for detection and/or diagnosis of SARS-CoV-2 by FDA under an Emergency Use Authorization (EUA). This EUA will remain in effect (meaning this test can be used) for the duration of the COVID-19 declaration under Section 564(b)(1) of the Act, 21 U.S.C. section 360bbb-3(b)(1), unless the authorization is terminated or revoked.  Performed at [facility], [address] [place], [place] [address]   Blood culture (routine x 2)     Status: Abnormal   Collection Time: 11/15/20  4:10 PM   Specimen: BLOOD  Result Value Ref Range Status   Specimen Description   Final    BLOOD LEFT ANTECUBITAL Performed at [facility], [address] [place], [place] [address]    Special Requests   Final    BOTTLES DRAWN AEROBIC AND ANAEROBIC Blood Culture adequate volume Performed at [facility], [address] [place], [place] [address]    Culture  Setup Time   Final    GRAM POSITIVE COCCI IN BOTH AEROBIC AND ANAEROBIC BOTTLES CRITICAL RESULT CALLED TO, READ BACK BY AND VERIFIED WITH: CINDY REED,RN  11/16/20 EB Performed at [facility], [address] [place], [place] [address]    Culture METHICILLIN RESISTANT STAPHYLOCOCCUS AUREUS (A)  Final   Report Status 11/18/2020 FINAL  Final   Organism ID, Bacteria METHICILLIN RESISTANT STAPHYLOCOCCUS AUREUS  Final      Susceptibility   Methicillin resistant staphylococcus aureus - MIC*    CIPROFLOXACIN <=0.5 SENSITIVE Sensitive     ERYTHROMYCIN >=8 RESISTANT Resistant     GENTAMICIN <=0.5 SENSITIVE Sensitive     OXACILLIN >=4 RESISTANT Resistant     TETRACYCLINE <=1 SENSITIVE Sensitive     VANCOMYCIN 1 SENSITIVE Sensitive     TRIMETH/SULFA <=10 SENSITIVE Sensitive     CLINDAMYCIN <=0.25 SENSITIVE Sensitive     RIFAMPIN <=0.5 SENSITIVE Sensitive     Inducible Clindamycin NEGATIVE Sensitive     * METHICILLIN RESISTANT STAPHYLOCOCCUS AUREUS  Blood Culture ID Panel (Reflexed)     Status: Abnormal   Collection Time: 11/15/20  4:10 PM  Result Value Ref Range Status   Enterococcus faecalis NOT DETECTED NOT DETECTED Final   Enterococcus Faecium NOT DETECTED NOT DETECTED Final   Listeria monocytogenes NOT DETECTED NOT DETECTED Final   Staphylococcus species DETECTED (A) NOT DETECTED Final    Comment: CRITICAL RESULT CALLED TO, READ BACK BY AND VERIFIED WITH: CINDY REED,RN  11/16/20 EB    Staphylococcus aureus (BCID)  DETECTED (A) NOT DETECTED Final    Comment: Methicillin (oxacillin)-resistant Staphylococcus aureus (MRSA). MRSA is predictably resistant to beta-lactam antibiotics (except ceftaroline). Preferred therapy is vancomycin unless clinically contraindicated. Patient requires contact precautions if  hospitalized. CRITICAL RESULT CALLED TO, READ BACK BY AND VERIFIED WITH: CINDY REED,RN   11/16/20 EB    Staphylococcus epidermidis NOT DETECTED NOT DETECTED Final   Staphylococcus lugdunensis NOT DETECTED NOT DETECTED Final   Streptococcus species NOT DETECTED NOT DETECTED Final   Streptococcus agalactiae NOT DETECTED NOT DETECTED Final   Streptococcus pneumoniae NOT DETECTED NOT DETECTED Final   Streptococcus pyogenes NOT DETECTED NOT DETECTED Final   A.calcoaceticus-baumannii NOT DETECTED NOT DETECTED Final   Bacteroides fragilis NOT DETECTED NOT DETECTED Final   Enterobacterales NOT DETECTED NOT DETECTED Final   Enterobacter cloacae complex NOT DETECTED NOT DETECTED Final   Escherichia coli NOT DETECTED NOT DETECTED Final   Klebsiella aerogenes NOT DETECTED NOT DETECTED Final   Klebsiella oxytoca NOT DETECTED NOT DETECTED Final   Klebsiella pneumoniae NOT DETECTED NOT DETECTED Final   Proteus species NOT DETECTED NOT DETECTED Final   Salmonella species NOT DETECTED NOT DETECTED Final   Serratia marcescens NOT DETECTED NOT DETECTED Final   Haemophilus influenzae NOT DETECTED NOT DETECTED Final   Neisseria meningitidis NOT DETECTED NOT DETECTED Final   Pseudomonas aeruginosa NOT DETECTED NOT DETECTED Final   Stenotrophomonas maltophilia NOT DETECTED NOT DETECTED Final   Candida albicans NOT DETECTED NOT DETECTED Final   Candida auris NOT DETECTED NOT DETECTED Final   Candida glabrata NOT DETECTED NOT DETECTED Final   Candida krusei NOT DETECTED NOT DETECTED Final   Candida parapsilosis NOT DETECTED NOT DETECTED Final   Candida tropicalis NOT DETECTED NOT DETECTED Final   Cryptococcus neoformans/gattii NOT DETECTED NOT DETECTED Final   Meth resistant mecA/C and MREJ DETECTED (A) NOT DETECTED Final    Comment: CRITICAL RESULT CALLED TO, READ BACK BY AND VERIFIED WITH: CINDY REED,RN  11/16/20 EB Performed at [facility], [address], [place], [place] [address]   Blood culture (routine x 2)     Status: Abnormal   Collection Time: 11/15/20  4:25 PM    Specimen: BLOOD  Result Value Ref Range Status   Specimen Description   Final    BLOOD LEFT ANTECUBITAL Performed at [facility], [address] [place], [place] [address]    Special Requests   Final    BOTTLES DRAWN AEROBIC ONLY Blood Culture results may not be optimal due to an inadequate volume of blood received in culture bottles Performed at [facility], [address] [place], [place] [address]    Culture  Setup Time   Final    GRAM POSITIVE COCCI IN CLUSTERS AEROBIC BOTTLE ONLY CRITICAL VALUE NOTED.  VALUE IS CONSISTENT WITH PREVIOUSLY REPORTED AND CALLED VALUE.    Culture (A)  Final    STAPHYLOCOCCUS AUREUS SUSCEPTIBILITIES PERFORMED ON PREVIOUS CULTURE WITHIN THE LAST 5 DAYS. Performed at [facility], [address], [place], [place] [address]    Report Status 11/18/2020 FINAL  Final  Urine culture     Status: Abnormal   Collection Time: 11/15/20  7:47 PM   Specimen: Urine, Catheterized  Result Value Ref Range Status   Specimen Description   Final    URINE, CATHETERIZED Performed at [facility], [address] [place], [place] [address]    Special Requests   Final    Normal Performed  at [facility], [address] [place], [place] [address]    Culture (A)  Final    >=100,000 COLONIES/mL METHICILLIN RESISTANT STAPHYLOCOCCUS AUREUS   Report Status 11/18/2020 FINAL  Final   Organism ID, Bacteria METHICILLIN RESISTANT STAPHYLOCOCCUS AUREUS (A)  Final      Susceptibility   Methicillin resistant staphylococcus aureus - MIC*    CIPROFLOXACIN <=0.5 SENSITIVE Sensitive     GENTAMICIN <=0.5 SENSITIVE Sensitive     NITROFURANTOIN <=16 SENSITIVE Sensitive     OXACILLIN >=4 RESISTANT Resistant     TETRACYCLINE <=1 SENSITIVE Sensitive     VANCOMYCIN <=0.5 SENSITIVE Sensitive     TRIMETH/SULFA <=10 SENSITIVE Sensitive     CLINDAMYCIN <=0.25 SENSITIVE Sensitive     RIFAMPIN <=0.5 SENSITIVE Sensitive     Inducible  Clindamycin NEGATIVE Sensitive     * >=100,000 COLONIES/mL METHICILLIN RESISTANT STAPHYLOCOCCUS AUREUS  Culture, blood (single)     Status: Abnormal   Collection Time: 11/15/20  9:35 PM   Specimen: BLOOD RIGHT HAND  Result Value Ref Range Status   Specimen Description   Final    BLOOD RIGHT HAND Performed at [facility], [address] [place], [place] [address]    Special Requests   Final    BOTTLES DRAWN AEROBIC AND ANAEROBIC Blood Culture adequate volume Performed at [facility], [address] [place], [place] [address]    Culture  Setup Time   Final    GRAM POSITIVE COCCI IN CLUSTERS IN BOTH AEROBIC AND ANAEROBIC BOTTLES CRITICAL VALUE NOTED.  VALUE IS CONSISTENT WITH PREVIOUSLY REPORTED AND CALLED VALUE.    Culture (A)  Final    STAPHYLOCOCCUS AUREUS SUSCEPTIBILITIES PERFORMED ON PREVIOUS CULTURE WITHIN THE LAST 5 DAYS. Performed at [facility], [address], [place], [place] [address]    Report Status 11/20/2020 FINAL  Final  Culture, blood (routine x 2)     Status: None   Collection Time: 11/17/20  9:28 AM   Specimen: BLOOD RIGHT FOREARM  Result Value Ref Range Status   Specimen Description BLOOD RIGHT FOREARM  Final   Special Requests   Final    BOTTLES DRAWN AEROBIC AND ANAEROBIC Blood Culture results may not be optimal due to an inadequate volume of blood received in culture bottles   Culture   Final    NO GROWTH 5 DAYS Performed at [facility], [address], [place], [place] [address]    Report Status 11/22/2020 FINAL  Final  Culture, blood (routine x 2)     Status: Abnormal   Collection Time: 11/17/20  9:39 AM   Specimen: BLOOD RIGHT HAND  Result Value Ref Range Status   Specimen Description BLOOD RIGHT HAND  Final   Special Requests   Final    BOTTLES DRAWN AEROBIC AND ANAEROBIC Blood Culture results may not be optimal due to an inadequate volume of blood received in culture bottles   Culture  Setup Time   Final     GRAM POSITIVE COCCI IN CLUSTERS ANAEROBIC BOTTLE ONLY Organism ID to follow CRITICAL RESULT CALLED TO, READ BACK BY AND VERIFIED WITHThresa Ross PHARMD, AT 5284 11/20/20 Renato Shin Performed at [facility], [address], [place], [place] [address]    Culture METHICILLIN RESISTANT STAPHYLOCOCCUS AUREUS (A)  Final   Report Status 11/22/2020 FINAL  Final   Organism ID, Bacteria METHICILLIN RESISTANT STAPHYLOCOCCUS AUREUS  Final  Susceptibility   Methicillin resistant staphylococcus aureus - MIC*    CIPROFLOXACIN <=0.5 SENSITIVE Sensitive     ERYTHROMYCIN >=8 RESISTANT Resistant     GENTAMICIN <=0.5 SENSITIVE Sensitive     OXACILLIN >=4 RESISTANT Resistant     TETRACYCLINE <=1 SENSITIVE Sensitive     VANCOMYCIN 1 SENSITIVE Sensitive     TRIMETH/SULFA <=10 SENSITIVE Sensitive     CLINDAMYCIN <=0.25 SENSITIVE Sensitive     RIFAMPIN <=0.5 SENSITIVE Sensitive     Inducible Clindamycin NEGATIVE Sensitive     * METHICILLIN RESISTANT STAPHYLOCOCCUS AUREUS  Blood Culture ID Panel (Reflexed)     Status: Abnormal   Collection Time: 11/17/20  9:39 AM  Result Value Ref Range Status   Enterococcus faecalis NOT DETECTED NOT DETECTED Final   Enterococcus Faecium NOT DETECTED NOT DETECTED Final   Listeria monocytogenes NOT DETECTED NOT DETECTED Final   Staphylococcus species DETECTED (A) NOT DETECTED Final    Comment: CRITICAL RESULT CALLED TO, READ BACK BY AND VERIFIED WITHThresa Ross PHARMD, AT 1308 11/20/20 D. VANHOOK    Staphylococcus aureus (BCID) DETECTED (A) NOT DETECTED Final    Comment: Methicillin (oxacillin)-resistant Staphylococcus aureus (MRSA). MRSA is predictably resistant to beta-lactam antibiotics (except ceftaroline). Preferred therapy is vancomycin unless clinically contraindicated. Patient requires contact precautions if  hospitalized. CRITICAL RESULT CALLED TO, READ BACK BY AND VERIFIED WITHThresa Ross PHARMD, AT 6578 11/20/20 D. VANHOOK    Staphylococcus  epidermidis NOT DETECTED NOT DETECTED Final   Staphylococcus lugdunensis NOT DETECTED NOT DETECTED Final   Streptococcus species NOT DETECTED NOT DETECTED Final   Streptococcus agalactiae NOT DETECTED NOT DETECTED Final   Streptococcus pneumoniae NOT DETECTED NOT DETECTED Final   Streptococcus pyogenes NOT DETECTED NOT DETECTED Final   A.calcoaceticus-baumannii NOT DETECTED NOT DETECTED Final   Bacteroides fragilis NOT DETECTED NOT DETECTED Final   Enterobacterales NOT DETECTED NOT DETECTED Final   Enterobacter cloacae complex NOT DETECTED NOT DETECTED Final   Escherichia coli NOT DETECTED NOT DETECTED Final   Klebsiella aerogenes NOT DETECTED NOT DETECTED Final   Klebsiella oxytoca NOT DETECTED NOT DETECTED Final   Klebsiella pneumoniae NOT DETECTED NOT DETECTED Final   Proteus species NOT DETECTED NOT DETECTED Final   Salmonella species NOT DETECTED NOT DETECTED Final   Serratia marcescens NOT DETECTED NOT DETECTED Final   Haemophilus influenzae NOT DETECTED NOT DETECTED Final   Neisseria meningitidis NOT DETECTED NOT DETECTED Final   Pseudomonas aeruginosa NOT DETECTED NOT DETECTED Final   Stenotrophomonas maltophilia NOT DETECTED NOT DETECTED Final   Candida albicans NOT DETECTED NOT DETECTED Final   Candida auris NOT DETECTED NOT DETECTED Final   Candida glabrata NOT DETECTED NOT DETECTED Final   Candida krusei NOT DETECTED NOT DETECTED Final   Candida parapsilosis NOT DETECTED NOT DETECTED Final   Candida tropicalis NOT DETECTED NOT DETECTED Final   Cryptococcus neoformans/gattii NOT DETECTED NOT DETECTED Final   Meth resistant mecA/C and MREJ DETECTED (A) NOT DETECTED Final    Comment: CRITICAL RESULT CALLED TO, READ BACK BY AND VERIFIED WITHThresa Ross PHARMD, AT 4696 11/20/20 Renato Shin Performed at [facility], [address] [place], [place] [address]   MRSA PCR Screening     Status: Abnormal   Collection Time: 11/17/20 11:38 AM   Specimen: Nasopharyngeal   Result Value Ref Range Status   MRSA by PCR POSITIVE (A) NEGATIVE Final    Comment:        The GeneXpert MRSA Assay (FDA approved  for NASAL specimens only), is one component of a comprehensive MRSA colonization surveillance program. It is not intended to diagnose MRSA infection nor to guide or monitor treatment for MRSA infections. RESULT CALLED TO, READ BACK BY AND VERIFIED WITH: R ROBERTS RN 1400 11/17/20 A BROWNING Performed at [facility], [address] [place], [place] [address]   Body fluid culture w Gram Stain     Status: None   Collection Time: 11/17/20  4:05 PM   Specimen: Synovium; Body Fluid  Result Value Ref Range Status   Specimen Description SYNOVIAL RIGHT SHOULDER  Final   Special Requests NONE  Final   Gram Stain   Final    ABUNDANT WBC PRESENT, PREDOMINANTLY PMN NO ORGANISMS SEEN    Culture   Final    RARE METHICILLIN RESISTANT STAPHYLOCOCCUS AUREUS CRITICAL RESULT CALLED TO, READ BACK BY AND VERIFIED WITH: RN T.SHELTON AT 1211 ON 11/19/2020 BY T.SAAD Performed at [facility], [address], [place], [place] [address]    Report Status 11/21/2020 FINAL  Final   Organism ID, Bacteria METHICILLIN RESISTANT STAPHYLOCOCCUS AUREUS  Final      Susceptibility   Methicillin resistant staphylococcus aureus - MIC*    CIPROFLOXACIN <=0.5 SENSITIVE Sensitive     ERYTHROMYCIN >=8 RESISTANT Resistant     GENTAMICIN <=0.5 SENSITIVE Sensitive     OXACILLIN >=4 RESISTANT Resistant     TETRACYCLINE <=1 SENSITIVE Sensitive     VANCOMYCIN <=0.5 SENSITIVE Sensitive     TRIMETH/SULFA <=10 SENSITIVE Sensitive     CLINDAMYCIN <=0.25 SENSITIVE Sensitive     RIFAMPIN <=0.5 SENSITIVE Sensitive     Inducible Clindamycin NEGATIVE Sensitive     * RARE METHICILLIN RESISTANT STAPHYLOCOCCUS AUREUS  Culture, blood (Routine X 2) w Reflex to ID Panel     Status: None (Preliminary result)   Collection Time: 11/21/20  5:26 AM   Specimen: BLOOD RIGHT HAND  Result Value Ref  Range Status   Specimen Description BLOOD RIGHT HAND  Final   Special Requests   Final    BOTTLES DRAWN AEROBIC ONLY Blood Culture adequate volume   Culture   Final    NO GROWTH 2 DAYS Performed at [facility], [address], [place], [place] [address]    Report Status PENDING  Incomplete  Culture, blood (Routine X 2) w Reflex to ID Panel     Status: None (Preliminary result)   Collection Time: 11/21/20  5:26 AM   Specimen: BLOOD LEFT HAND  Result Value Ref Range Status   Specimen Description BLOOD LEFT HAND  Final   Special Requests   Final    BOTTLES DRAWN AEROBIC ONLY Blood Culture adequate volume   Culture   Final    NO GROWTH 2 DAYS Performed at [facility], [address], [place], [place] [address]    Report Status PENDING  Incomplete   Pertinent Lab. CBC Latest Ref Rng & Units 11/23/2020 11/22/2020 11/21/2020  WBC 4.0 - 10.5 K/uL 11.8(H) - 9.6  Hemoglobin 12.0 - 15.0 g/dL 8.2(L) 8.2(L) 7.8(L)  Hematocrit 36.0 - 46.0 % 24.9(L) 24.0(L) 23.3(L)  Platelets 150 - 400 K/uL 175 - 92(L)   CMP Latest Ref Rng & Units 11/23/2020 11/22/2020 11/22/2020  Glucose 70 - 99 mg/dL 95 80 -  BUN 6 - 20 mg/dL 03(K) 74(Q) -  Creatinine 0.44 - 1.00 mg/dL 5.95(G) 3.87(F) -  Sodium 135 - 145 mmol/L 133(L) 135 135  Potassium 3.5 - 5.1 mmol/L  5.0 5.6(H) 5.5(H)  Chloride 98 - 111 mmol/L 105 106 -  CO2 22 - 32 mmol/L 17(L) 19(L) -  Calcium 8.9 - 10.3 mg/dL 6.9(L) 6.8(L) -  Total Protein 6.5 - 8.1 g/dL - - -  Total Bilirubin 0.3 - 1.2 mg/dL - - -  Alkaline Phos 38 - 126 U/L - - -  AST 15 - 41 U/L - - -  ALT 0 - 44 U/L - - -     Pertinent Imaging today Plain films and CT images have been personally visualized and interpreted; radiology reports have been reviewed. Decision making incorporated into the Impression / Recommendations.  I have spent approx 30 minutes for this patient encounter including review of prior medical records with greater than 50% of time being face to face and  coordination of their care.  Electronically signed by:   Odette Fraction, MD Infectious Disease Physician [facility] Pager: [phone]

## 2020-11-23 NOTE — Plan of Care (Signed)
  Problem: Fluid Volume: Goal: Hemodynamic stability will improve Outcome: Progressing   Problem: Clinical Measurements: Goal: Diagnostic test results will improve Outcome: Progressing Goal: Signs and symptoms of infection will decrease Outcome: Progressing   Problem: Respiratory: Goal: Ability to maintain adequate ventilation will improve Outcome: Progressing   Problem: Clinical Measurements: Goal: Ability to maintain clinical measurements within normal limits will improve Outcome: Progressing Goal: Will remain free from infection Outcome: Progressing Goal: Diagnostic test results will improve Outcome: Progressing Goal: Respiratory complications will improve Outcome: Progressing Goal: Cardiovascular complication will be avoided Outcome: Progressing   Problem: Coping: Goal: Level of anxiety will decrease Outcome: Progressing   Problem: Elimination: Goal: Will not experience complications related to bowel motility Outcome: Progressing Goal: Will not experience complications related to urinary retention Outcome: Progressing   Problem: Pain Managment: Goal: General experience of comfort will improve Outcome: Progressing   Problem: Safety: Goal: Ability to remain free from injury will improve Outcome: Progressing   Problem: Skin Integrity: Goal: Risk for impaired skin integrity will decrease Outcome: Progressing

## 2020-11-23 NOTE — Progress Notes (Signed)
Nephrology Follow-Up Consult note   Assessment/Recommendations: Anne Bautista is a/an 35 y.o. female with a past medical history significant for IVDU, tobacco use disorder, malnutrition who present w/ likely endocarditis with sepsis  Non-Oliguric AKI: Creatinine on arrival 1.3 improved to 0.8.  Creatinine rising since 2/15.    Urinalysis with minimal proteinuria and no significant hematuria making infection associated GN less likely.  More likely a mix of ATN from sepsis and septic embolization as well as contrast associated injury. Creatinine relatively stable at this point -Care at this time is supportive -Continue to monitor daily Cr, Dose meds for GFR -Monitor Daily I/Os, Daily weight  -Maintain MAP>65 for optimal renal perfusion.  -Avoid nephrotoxic medications including NSAIDs and Vanc/Zosyn combo -Currently no indication for HD  Sepsis/MRSA bacteremia/endocarditis: Antibiotics and further management per primary team and infectious disease.  Blood pressure acceptable at this time  Anasarca: Related to volume overload, acute illness, hypoalbuminemia. Continue to monitor. Hopefully will resolve with the patient improves  Hyperkalemia: Improved with Lokelma. Continue to monitor  Anemia: Multifactorial with kidney disease contributing minimally.  No indication for ESA at this time.  Continue to monitor  Hypoxic respiratory failure: Minimal oxygen requirement at this time.  Continue to monitor per primary team  Severe malnutrition: Dietary involved.  Hyponatremia: Sodium improving as AKI resolved. Continue to monitor   Recommendations conveyed to primary service.    [name] [facility] 11/23/2020 9:18 AM  ___________________________________________________________  CC: AKI  Interval History/Subjective: Needs to have fairly good urine output over the past 24 hours. Creatinine steady at 2.7. Patient resting without complaints  today.   Medications:  Current Facility-Administered Medications  Medication Dose Route Frequency Provider Last Rate Last Admin  . 0.9 %  sodium chloride infusion   Intravenous PRN Steffanie Dunn, DO   Stopped at 11/18/20 1145  . acetaminophen (TYLENOL) tablet 650 mg  650 mg Oral Q6H PRN Jonah Blue, MD   650 mg at 11/20/20 2224   Or  . acetaminophen (TYLENOL) suppository 650 mg  650 mg Rectal Q6H PRN Jonah Blue, MD      . bisacodyl (DULCOLAX) suppository 10 mg  10 mg Rectal Daily PRN Dellia Cloud, MD      . chlorhexidine (PERIDEX) 0.12 % solution 15 mL  15 mL Mouth Rinse BID Karie Fetch P, DO   15 mL at 11/22/20 2123  . Chlorhexidine Gluconate Cloth 2 % PADS 6 each  6 each Topical Daily Kirtland Bouchard, MD      . DAPTOmycin (CUBICIN) 770 mg in sodium chloride 0.9 % IVPB  10 mg/kg Intravenous Q2000 Odette Fraction, MD   Stopped at 11/22/20 2018  . docusate sodium (COLACE) capsule 100 mg  100 mg Oral BID Max Fickle B, MD   100 mg at 11/22/20 2336  . doxycycline (VIBRA-TABS) tablet 100 mg  100 mg Oral Q12H Odette Fraction, MD   100 mg at 11/22/20 2335  . feeding supplement (ENSURE ENLIVE / ENSURE PLUS) liquid 237 mL  237 mL Oral TID BM Karie Fetch P, DO   237 mL at 11/22/20 0900  . folic acid (FOLVITE) tablet 1 mg  1 mg Oral Daily Jonah Blue, MD   1 mg at 11/22/20 0900  . heparin injection 5,000 Units  5,000 Units Subcutaneous Q8H Leslye Peer, MD   5,000 Units at 11/23/20 0506  . HYDROmorphone (DILAUDID) injection 1 mg  1 mg Intravenous Q4H PRN Leslye Peer, MD   1 mg at  11/21/20 1916  . LORazepam (ATIVAN) injection 0.5-1 mg  0.5-1 mg Intravenous Q6H PRN Lupita Leash, MD   1 mg at 11/22/20 1725  . MEDLINE mouth rinse  15 mL Mouth Rinse q12n4p Karielle, Davidow P, DO   15 mL at 11/22/20 1657  . methadone (DOLOPHINE) tablet 10 mg  10 mg Oral Q8H Leslye Peer, MD   10 mg at 11/23/20 0506  . metoprolol tartrate (LOPRESSOR) injection 2.5 mg  2.5 mg  Intravenous Q6H PRN Regalado, Belkys A, MD   2.5 mg at 11/20/20 1934  . multivitamin with minerals tablet 1 tablet  1 tablet Oral Daily Odette Fraction, MD   1 tablet at 11/22/20 1700  . nicotine (NICODERM CQ - dosed in mg/24 hours) patch 14 mg  14 mg Transdermal Daily PRN Jonah Blue, MD      . oxyCODONE (Oxy IR/ROXICODONE) immediate release tablet 5 mg  5 mg Oral Q6H PRN Leslye Peer, MD      . polyethylene glycol (MIRALAX / GLYCOLAX) packet 17 g  17 g Oral BID Lupita Leash, MD   17 g at 11/22/20 2335  . QUEtiapine (SEROQUEL) tablet 25 mg  25 mg Oral BID Dellia Cloud, MD   25 mg at 11/22/20 2335  . senna (SENOKOT) tablet 17.2 mg  2 tablet Oral Daily Dellia Cloud, MD   17.2 mg at 11/22/20 0900  . sodium chloride flush (NS) 0.9 % injection 10-40 mL  10-40 mL Intracatheter Q12H Leslye Peer, MD   10 mL at 11/21/20 2138  . sodium chloride flush (NS) 0.9 % injection 10-40 mL  10-40 mL Intracatheter PRN Leslye Peer, MD      . sodium chloride flush (NS) 0.9 % injection 3 mL  3 mL Intravenous Q12H Jonah Blue, MD   3 mL at 11/21/20 0947  . thiamine tablet 100 mg  100 mg Oral Daily Jonah Blue, MD   100 mg at 11/22/20 0900      Review of Systems: 10 systems reviewed and negative except per interval history/subjective  Physical Exam: Vitals:   11/23/20 0700 11/23/20 0720  BP: 129/75   Pulse: (!) 123   Resp: (!) 33   Temp:  98.2 F (36.8 C)  SpO2: 96%    No intake/output data recorded.  Intake/Output Summary (Last 24 hours) at 11/23/2020 4403 Last data filed at 11/23/2020 0454 Gross per 24 hour  Intake 115 ml  Output 1351 ml  Net -1236 ml   General: Ill-appearing, lying in bed, no distress CV: Tachycardia, regular rhythm, anasarca with lower extremity pitting edema 1+ Lungs: Coarse bilateral breath sounds, tachypneic, mild increased work of breathing Abd: soft, non-tender, non-distended Skin: Erythema of left shin, otherwise no visible lesions or  rashes Musculoskeletal: no obvious deformities   Test Results I personally reviewed new and old clinical labs and radiology tests Lab Results  Component Value Date   NA 133 (L) 11/23/2020   K 5.0 11/23/2020   CL 105 11/23/2020   CO2 17 (L) 11/23/2020   BUN 90 (H) 11/23/2020   CREATININE 2.73 (H) 11/23/2020   CALCIUM 6.9 (L) 11/23/2020   ALBUMIN 1.4 (L) 11/23/2020   PHOS 8.7 (H) 11/23/2020

## 2020-11-23 NOTE — Progress Notes (Signed)
  Speech Language Pathology Treatment: Dysphagia  Patient Details Name: Anne Bautista MRN: [id number] DOB: 06-22-1986 Today's Date: 11/23/2020 Time: 9629-5284 SLP Time Calculation (min) (ACUTE ONLY): 15 min  Assessment / Plan / Recommendation Clinical Impression  Pt was seen for dysphagia treatment. An NPO status was recommended by SLP on 2/19. Pt's RN, Suzette Battiest, reported that the pt has been consuming a clear liquid diet and has been coughing inconsistently. Pt's RR was intermittently elevated to 30s and up to 40, but was below 30 during p.o. trials. Pt exhibited coughing with thin liquids and regular textures solids. Puree was tolerated without overt s/sx of aspiration. Pt's oral phase appeared [facility] for limited trials. A modified barium swallow study is recommended to further assess physiology and is scheduled for today at 1130.    HPI HPI: Pt is a 35 y.o. admitted 2/14 with sepsis due to MRSA bacteremia in setting of IVDU. Rt shoulder pain s/p aspiration 2/14. PMHx: IVDU, malnutrition, poor dentition. CXR 2/17: Stable bilateral rounded nodular densities are noted throughout both  lungs concerning for septic emboli or possibly metastatic disease.  Increased left basilar opacity is noted concerning for worsening  pneumonia with possible associated pleural effusion.  CT head Air-fluid level in the right maxillary sinus suggesting  rhinosinusitis. CXR 2/10: Slightly increased bilateral LOWER lung consolidation/atelectasis  and trace bilateral pleural effusions.      SLP Plan  MBS       Recommendations  Diet recommendations: NPO Medication Administration: Crushed with puree Supervision: Staff to assist with self feeding                Oral Care Recommendations: Oral care QID Follow up Recommendations:  (TBD) SLP Visit Diagnosis: Dysphagia, unspecified (R13.10) Plan: MBS       Peityn Payton I. Vear Clock, MS, CCC-SLP Acute Rehabilitation Services Office number [phone] Pager  [phone]                Scheryl Marten 11/23/2020, 10:23 AM

## 2020-11-23 NOTE — Progress Notes (Signed)
Pharmacy Antibiotic Note  Anne Bautista is a 35 y.o. 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  Pharmacy has been consulted for Daptomycin dosing.  Also on Doxycycline. SCr stable at 2.73. WBC slightly up 11.8. Platelets improving. Afebrile. CK today 45.   Plan: Continue Daptomycin 770 mg IV every 24 hours.  Continue monitoring CK - may consider weekly monitoring  Height: 5\' 7"  (170.2 cm) Weight: 77 kg (169 lb 12.1 oz) IBW/kg (Calculated) : 61.6  Temp (24hrs), Avg:98.2 F (36.8 C), Min:97.7 F (36.5 C), Max:99.1 F (37.3 C)  Recent Labs  Lab 11/16/20 1930 11/16/20 2234 11/17/20 0234 11/17/20 1019 11/17/20 1150 11/18/20 0147 11/19/20 0241 11/20/20 0253 11/20/20 0918 11/21/20 0526 11/21/20 0607 11/21/20 1500 11/22/20 1400 11/23/20 0217  WBC  --   --  8.2  --   --   --   --   --  10.0  --  9.6  --   --  11.8*  CREATININE 0.92  --  0.84  --   --    < > 1.87* 2.40*  --  2.69*  --  2.63* 2.76* 2.73*  LATICACIDVEN 3.6* 3.1*  --  2.7* 2.1*  --   --   --   --  0.8  --   --   --   --   VANCOPEAK  --   --   --   --   --   --  50*  --   --   --   --   --   --   --    < > = values in this interval not displayed.    Estimated Creatinine Clearance: 30.8 mL/min (A) (by C-G formula based on SCr of 2.73 mg/dL (H)).    No Known Allergies  Antimicrobials this admission: Cefepime 2/12>>2/13 Vancomycin 2/12>>2/16 Dapto 2/16>> Doxy 2/16>>  Microbiology results: 2/12Bcx: 3/3 MRSA  2/12 COVIDPCR:neg 2/12 UCx: > 100K MRSA 2/14 Bcx: 1/4 MRSA 2/14 R shoulder fluid: MRSA 2/18 Bcx sent Thank you for allowing pharmacy to be a part of this patient's care.  3/18, PharmD, BCPS, BCCCP Clinical Pharmacist Please refer to [facility] for [facility] Pharmacy numbers 11/23/2020 10:29 AM

## 2020-11-23 NOTE — Progress Notes (Signed)
Modified Barium Swallow Progress Note  Patient Details  Name: Anne Bautista MRN: [id number] Date of Birth: [birth year]  Today's Date: 11/23/2020  Modified Barium Swallow completed.  Full report located under Chart Review in the Imaging Section.  Brief recommendations include the following:  Clinical Impression  Pt presents with oral phase dysphagia which is likely at least partially related to her mentation. She required cueing to open her eyes during the study and participate fully. She exhibited moaning, but verbal output was limited. She demonstrated moderate to significantly prolonged bolus manipulation and mastication of regular textures, and impaired posterior bolus propulsion. Pt inconsistently used a posterior head tilt to assist with A-P transport of solids, and mild lingual residue was noted. Coughing was intermittently observed throughout the study as was demonstrated during the treatment session earlier. However, no instances of penetration or aspiration were noted during the study even when she was challenged with intake of large boluses and with use of consecutive swallows. Pt's diet may be progressed to full liquids with potential for further advancement as her mentation improves. SLP will continue to follow pt.   Swallow Evaluation Recommendations       SLP Diet Recommendations: Thin liquid (full liquids)   Liquid Administration via: Cup;Straw   Medication Administration: Whole meds with puree   Supervision: Staff to assist with self feeding   Compensations: Slow rate;Small sips/bites   Postural Changes: Seated upright at 90 degrees   Oral Care Recommendations: Oral care BID      Shanika I. Vear Clock, MS, CCC-SLP Acute Rehabilitation Services Office number [phone] Pager [phone]   Scheryl Marten 11/23/2020,1:29 PM

## 2020-11-23 NOTE — Progress Notes (Signed)
NAME:  Anne Bautista, MRN:  [id number], DOB:  Nov 19, 1985, LOS: 7 ADMISSION DATE:  11/15/2020, CONSULTATION DATE:  02.14.2022 REFERRING MD:  Sunnie Nielsen, CHIEF COMPLAINT:  Sepsis  Brief History:  35yo female presented with cough and myalgias, found to be bacteremic with known history of IVDU  History of Present Illness:  Anne Bautista is a 35yo female with PMH significant for IVDU, tobacco abuse, and malnutrition who presented to the ED with complaints of cough and [name].  She reported "everything hurts".  Patient reports the symptoms have been present for approximately 1 week prior to admission.  Also reports fever upwards of 101.5, nonproductive cough and generalized weakness with ambulatory dysfunction.  Patient reports long running history of IV drug abuse with recent relapse 3 days prior to admission.  She does report prior history of endocarditis with hospitalization approximately 4 years ago where she left AGAINST MEDICAL ADVICE.  On arrival patient was seen afebrile, tachycardic, and tachypneic.  Significant lab work included: Sodium 124, potassium 2.6, glucose 128, BUN 40, creatinine 1.33, albumin 2.1 lactic acid 3.6, WBC 15.3, platelets 51.  Toxicology positive for cocaine urinalysis positive for trace leukocytes and nitrates.  Admission chest x-ray significant for bilateral lung opacities likely consistent with residual scarring from pulmonary emboli.  CT abdomen and pelvis consistent with multiple subtle hypoenhancing lesions of the renal cortical consistent with septic emboli and splenomegaly.  CTA chest negative for PE but revealed numerous bilateral cavitary nodules throughout the lungs most consistent with septic emboli.  Patient was initially admitted to hospitalist team for further management and treatment of bacteremia.  However morning of 2/14 patient began developing worsening signs of severe sepsis including fever with T-max of 100.2, tachycardia with a heart rate of 140, tachypnea  with respiratory rates in the upper 40s to 50s.  Blood cultures positive for Staph aureus. PCCM was consulted for further management and patient transferred to the ICU on 02.14.2022.  Past Medical History:  IV drug abuse Tobacco abuse Malnutrition Endocarditis  Significant Hospital Events:  2/13 Admitted  Consults:  Infectious Disease Cardiothoracic Surgery  Procedures:  2/14 Right shoulder aspiration   Significant Diagnostic Tests:  CT abd/ pelvis 2/12> mult. Hypo-enhancing lesions renal cortices, suspect septic emboli; splenomegaly CT angio chest 2/12> no PE; bilateral, numerous cavitary nodules, likely septic emboli vs vasculitis vs metastatic disease; pleural effusions, trace right, small left CT head w/o contrast 2/12> brain appears normal; right maxillary sinus, possible rhinosinusitis  ECHO 2/14> positive for vegetation on tricuspid valve LE Dopplers 2/14> negative for DVT bilaterally R Shoulder Joint Aspiration>  Rare Staph aureus, replating  Micro Data:  Covid 2/12 > negative Blood cultures > positive for Staph aureus Urine culture 2/12 > positive Staph aureus R Shoulder aspiration> positive Staph aureus   Antimicrobials:  Cefepime 2/12 >2/13 Flagyl 2/13 >2/13 Vancomycin 2/12 >>2/16 Daptomycin 2/16>> Doxycycline 2/16>> 2/20   Interim History / Subjective:   Methadone decreased on 2/19 given increased somnolence I/O+ 9.6 L total, urine output 1350 cc last 24 hours  Objective   Blood pressure (!) 146/83, pulse (!) 117, temperature 98.2 F (36.8 C), temperature source Oral, resp. rate (!) 25, height 5\' 7"  (1.702 m), weight 77 kg, last menstrual period 11/10/2020, SpO2 91 %.        Intake/Output Summary (Last 24 hours) at 11/23/2020 0723 Last data filed at 11/23/2020 0454 Gross per 24 hour  Intake 115 ml  Output 1351 ml  Net -1236 ml   Filed Weights   11/16/20 1147  11/17/20 0515 11/17/20 1100  Weight: 71.5 kg 78 kg 77 kg    Examination: General:  Chronically ill-appearing woman, more comfortable, less tachypneic, less moaning HENT: Oropharynx more clear, poor dentition, moist, pupils equal Lungs: Better air movement, scattered rales Cardiovascular: Regular, no murmur Abdomen: Mildly distended, tympanic, positive bowel sounds Extremities: 1+ bilateral pretibial edema Skin: Left ankle, lower extremity erythema improved Neuro: Awake, alert, interacting appropriately, answers questions.  Improved  Resolved Hospital Problem list     Assessment & Plan:  Severe Sepsis with MRSA bacteremia in the setting of IV drug use Endocarditis (hx of endocarditis w/ sub-optimal abx regimen (4 weeks, not 6)) Septic emboli to kidneys and lungs -Continue daptomycin.  Doxycycline will end today 2/20 (5 days) -Appreciate ID and thoracic surgery evaluations -Follow respiratory status, chest x-ray -Attempted balance narcotics, sedating medication as below to avoid respiratory suppression  Acute respiratory failure, tachypnea.  Improving In part due to narcotics withdrawal superimposed on bilateral embolic pneumonias -Continue supportive care -Balancing methadone, short acting narcotics.  Respiratory pattern improved 2/20 -Follow intermittent chest x-ray, at risk cavitation, at risk pleural effusion -Wean FiO2 as able.  Appears to have avoided intubation/mechanical ventilation for now.  IV drug use, tobacco use, cocaine use, marijuana use Anxiety: More somnolent on 2/19, improved 2/20 -Methadone decreased on 2/19, continue same dosing -Continue scheduled oxycodone -Dilaudid available as needed -Seroquel twice daily, consider increase going forward -Bowel regimen -Nicotine replacement  Acute Thrombocytopenia  Acute Anemia  Possibly due to sepsis vs drug use. Spleen is enlarged on CT on 2/12.  -Continue to follow CBC -Hemoglobin goal 7.0, platelet goal 10K or 50 K if any evidence of blood loss  AKI, Severe Sepsis and Nephrotoxic  Medications: Hyperkalemia: Hyponatremia Oliguria: We will continue to monitor closely. Has been receiving fluids. This was stopped this morning due to increased hypervolemia on exam, likely extravascular edema. She continues to have worsening kidney function with evidence of hyperkalemia and volume overload. Urine output questionable. -Appreciate nephrology assistance.  Serum creatinine stabilizing 2/20 with continued urine output -Follow urine output, BMP -Dose Lokelma daily depending on hyperkalemia -Renal dose medications -Maintain adequate perfusion pressure  Right shoulder effusion, septic arthritis vs reactive arthritis  R shoulder aspirated, fluid evaluated. Septic joint possible even with WBCs in the 8K range, gram stain abundant WBCs, few neutrophils, grew staph. Possibly inflammatory vs reactive as well. Culture grew MRSA. Ortho is following for possible washout once patient improves clinically.   -Appreciate Ortho assistance.  No surgical intervention at this time, following -Antibiotics as above -Pain control  Cellulitis on Left ankle Cellulitis is appears to be progressing since starting AB. Likely secondary to toxin release since starting AB. Will continue to follow. XR shows no signs of soft tissue gas. Physical exam negative for crepitus  -Erythema improving, continue to follow  Acute on chronic constipation: Very limited movement, chronic opioid use and current opioid use. Poor PO intake. No BM this admit and patient unable to recall last BM. -Aggressive bowel regimen -Mobilize as able  Malnutrition Multiple likely etiologies including poor dentition, IVDU Currently Clear Liquid Diet, plan to advance to mechanical soft when patient is eating more -Advance diet as she can tolerate  Best practice (evaluated daily)  Diet: Clears, then supplemented and advanced Pain/Anxiety/Delirium protocol (if indicated): Dilaudid, Oxy IR, Ativan VAP protocol (if indicated): NA DVT  prophylaxis: Held (low platelets) GI prophylaxis: NA Glucose control: NA Mobility: Bed Disposition:ICU  Goals of Care:  Last date of multidisciplinary goals of care  discussion: n/a Family and staff present: none Summary of discussion: none Follow up goals of care discussion due: none Code Status: FULL Family: have updated fiance, no immediate family currently available.   Labs   CBC: Recent Labs  Lab 11/17/20 0234 11/17/20 1019 11/20/20 0918 11/21/20 0607 11/22/20 1045 11/23/20 0217  WBC 8.2  --  10.0 9.6  --  11.8*  HGB 10.1*  --  8.2* 7.8* 8.2* 8.2*  HCT 27.9*  --  23.0* 23.3* 24.0* 24.9*  MCV 77.5*  --  77.7* 79.3*  --  81.6  PLT 40* 53* 67* 92*  --  175    Basic Metabolic Panel: Recent Labs  Lab 11/16/20 1037 11/16/20 1930 11/20/20 0253 11/21/20 0526 11/21/20 1500 11/22/20 1045 11/22/20 1400 11/23/20 0217  NA 130*   < > 131* 131* 131* 135 135 133*  K 2.5*   < > 4.7 5.2* 5.7* 5.5* 5.6* 5.0  CL 103   < > 101 102 103  --  106 105  CO2 16*   < > 18* 17* 18*  --  19* 17*  GLUCOSE 119*   < > 93 103* 100*  --  80 95  BUN 25*   < > 77* 90* 89*  --  93* 90*  CREATININE 0.84   < > 2.40* 2.69* 2.63*  --  2.76* 2.73*  CALCIUM 7.0*   < > 6.7* 6.7* 6.8*  --  6.8* 6.9*  MG 1.9  --   --   --   --   --   --  2.7*  PHOS  --   --   --   --   --   --   --  8.7*   < > = values in this interval not displayed.   GFR: Estimated Creatinine Clearance: 30.8 mL/min (A) (by C-G formula based on SCr of 2.73 mg/dL (H)). Recent Labs  Lab 11/16/20 1930 11/16/20 2234 11/17/20 0234 11/17/20 1019 11/17/20 1150 11/20/20 0918 11/21/20 0526 11/21/20 0607 11/23/20 0217  PROCALCITON 1.97  --   --   --   --   --   --   --   --   WBC  --   --  8.2  --   --  10.0  --  9.6 11.8*  LATICACIDVEN 3.6* 3.1*  --  2.7* 2.1*  --  0.8  --   --     Liver Function Tests: Recent Labs  Lab 11/16/20 1037 11/17/20 1150 11/21/20 0526 11/22/20 1400 11/23/20 0217  AST 27 19 18   --   --   ALT 11  11 11   --   --   ALKPHOS 71 73 89  --   --   BILITOT 1.7* 1.5* 1.2  --   --   PROT 5.7* 5.5* 6.0*  --   --   ALBUMIN 1.7* 1.3* 1.1* 1.5* 1.4*   No results for input(s): LIPASE, AMYLASE in the last 168 hours. No results for input(s): AMMONIA in the last 168 hours.  ABG    Component Value Date/Time   PHART 7.309 (L) 11/22/2020 1045   PCO2ART 35.7 11/22/2020 1045   PO2ART 80 (L) 11/22/2020 1045   HCO3 17.9 (L) 11/22/2020 1045   TCO2 19 (L) 11/22/2020 1045   ACIDBASEDEF 8.0 (H) 11/22/2020 1045   O2SAT 95.0 11/22/2020 1045     Coagulation Profile: Recent Labs  Lab 11/17/20 1019  INR 1.4*  1.4*    Cardiac Enzymes: Recent Labs  Lab 11/20/20 0253 11/23/20 0217  CKTOTAL 280* 45    HbA1C: No results found for: HGBA1C  CBG: Recent Labs  Lab 11/19/20 0710 11/19/20 0835  GLUCAP 66* 78     Critical care time: 31 min    Levy Pupa, MD, PhD 11/23/2020, 7:23 AM [facility] [phone] or if no answer before 7:00PM call [phone] For any issues after 7:00PM please call eLink [phone]

## 2020-11-24 ENCOUNTER — Inpatient Hospital Stay (HOSPITAL_COMMUNITY): Payer: Self-pay

## 2020-11-24 LAB — CBC
HCT: 22.9 % — ABNORMAL LOW (ref 36.0–46.0)
Hemoglobin: 7.4 g/dL — ABNORMAL LOW (ref 12.0–15.0)
MCH: 26.6 pg (ref 26.0–34.0)
MCHC: 32.3 g/dL (ref 30.0–36.0)
MCV: 82.4 fL (ref 80.0–100.0)
Platelets: 205 10*3/uL (ref 150–400)
RBC: 2.78 MIL/uL — ABNORMAL LOW (ref 3.87–5.11)
RDW: 16.4 % — ABNORMAL HIGH (ref 11.5–15.5)
WBC: 10.4 10*3/uL (ref 4.0–10.5)
nRBC: 0 % (ref 0.0–0.2)

## 2020-11-24 LAB — BASIC METABOLIC PANEL
Anion gap: 9 (ref 5–15)
BUN: 82 mg/dL — ABNORMAL HIGH (ref 6–20)
CO2: 19 mmol/L — ABNORMAL LOW (ref 22–32)
Calcium: 7.3 mg/dL — ABNORMAL LOW (ref 8.9–10.3)
Chloride: 106 mmol/L (ref 98–111)
Creatinine, Ser: 2.75 mg/dL — ABNORMAL HIGH (ref 0.44–1.00)
GFR, Estimated: 22 mL/min — ABNORMAL LOW (ref >60)
Glucose, Bld: 99 mg/dL (ref 70–99)
Potassium: 5.1 mmol/L (ref 3.5–5.1)
Sodium: 134 mmol/L — ABNORMAL LOW (ref 135–145)

## 2020-11-24 LAB — MAGNESIUM: Magnesium: 2.6 mg/dL — ABNORMAL HIGH (ref 1.7–2.4)

## 2020-11-24 LAB — PHOSPHORUS: Phosphorus: 7.4 mg/dL — ABNORMAL HIGH (ref 2.5–4.6)

## 2020-11-24 MED ORDER — ACETYLCYSTEINE 20 % IN SOLN
4.0000 mL | Freq: Four times a day (QID) | RESPIRATORY_TRACT | Status: DC
  Administered 2020-11-24 – 2020-11-25 (×4): 4 mL via RESPIRATORY_TRACT
  Filled 2020-11-24 (×5): qty 4

## 2020-11-24 MED ORDER — ALBUTEROL SULFATE (2.5 MG/3ML) 0.083% IN NEBU: 2.5000 mg | INHALATION_SOLUTION | Freq: Four times a day (QID) | RESPIRATORY_TRACT | Status: DC | PRN

## 2020-11-24 MED ORDER — SODIUM CHLORIDE 0.9 % IV SOLN
10.0000 mg/kg | Freq: Every day | INTRAVENOUS | Status: DC
  Administered 2020-11-24 – 2020-11-27 (×4): 827 mg via INTRAVENOUS
  Filled 2020-11-24 (×5): qty 16.54

## 2020-11-24 MED ORDER — SODIUM ZIRCONIUM CYCLOSILICATE 10 G PO PACK
10.0000 g | PACK | Freq: Once | ORAL | Status: AC
  Administered 2020-11-24: 10 g via ORAL
  Filled 2020-11-24: qty 1

## 2020-11-24 MED ORDER — ALBUMIN HUMAN 25 % IV SOLN
25.0000 g | Freq: Once | INTRAVENOUS | Status: AC
  Administered 2020-11-24: 12.5 g via INTRAVENOUS
  Filled 2020-11-24: qty 100

## 2020-11-24 MED ORDER — LEVALBUTEROL HCL 0.63 MG/3ML IN NEBU
INHALATION_SOLUTION | RESPIRATORY_TRACT | Status: AC
  Filled 2020-11-24: qty 3

## 2020-11-24 MED ORDER — LEVALBUTEROL HCL 0.63 MG/3ML IN NEBU
0.6300 mg | INHALATION_SOLUTION | Freq: Four times a day (QID) | RESPIRATORY_TRACT | Status: DC | PRN
  Administered 2020-11-24 – 2020-11-26 (×7): 0.63 mg via RESPIRATORY_TRACT
  Filled 2020-11-24 (×7): qty 3

## 2020-11-24 MED ORDER — FUROSEMIDE 10 MG/ML IJ SOLN
60.0000 mg | Freq: Once | INTRAMUSCULAR | Status: AC
  Administered 2020-11-24: 60 mg via INTRAVENOUS
  Filled 2020-11-24: qty 6

## 2020-11-24 NOTE — Progress Notes (Signed)
eLink Physician-Brief Progress Note Patient Name: Anne Bautista DOB: 02/24/1986 MRN: [id number]   Date of Service  11/24/2020  HPI/Events of Note  Request to transfer patient to Progressive Care bed to create an ICU bed for another more critically ill patient.. Patient on 3 L/min [place] O2 with sat = 94%. Patient awake, alert and able to follow commands. In ICU to adjust pain medications while watching respiratory status.   eICU Interventions  Transfer to Progressive Care bed.      Intervention Category Major Interventions: Other:  Anne Bautista [name] 11/24/2020, 5:27 AM

## 2020-11-24 NOTE — Progress Notes (Signed)
   11/24/20 0644  Assess: MEWS Score  Temp 98.9 F (37.2 C)  BP (!) 148/106  Pulse Rate (!) 115  ECG Heart Rate (!) 115  Resp (!) 21  Level of Consciousness Alert  SpO2 94 %  O2 Device Nasal Cannula  Patient Activity (if Appropriate) In bed  O2 Flow Rate (L/min) 3 L/min  Assess: MEWS Score  MEWS Temp 0  MEWS Systolic 0  MEWS Pulse 2  MEWS RR 1  MEWS LOC 0  MEWS Score 3  MEWS Score Color Yellow  Assess: if the MEWS score is Yellow or Red  Were vital signs taken at a resting state? Yes  Focused Assessment No change from prior assessment  Early Detection of Sepsis Score *See Row Information* Low  MEWS guidelines implemented *See Row Information* Yes  Treat  MEWS Interventions Escalated (See documentation below)  Pain Scale 0-10  Pain Score 2  Faces Pain Scale 2  Pain Type Acute pain  Pain Location Generalized  Pain Orientation Right;Other (Comment) (shoulder)  Pain Descriptors / Indicators Aching;Discomfort  Pain Frequency Constant  Pain Onset On-going  Patients Stated Pain Goal 0  Pain Intervention(s) Repositioned  Neuro symptoms relieved by Rest;Music  Take Vital Signs  Increase Vital Sign Frequency  Yellow: Q 2hr X 2 then Q 4hr X 2, if remains yellow, continue Q 4hrs  Escalate  MEWS: Escalate Yellow: discuss with charge nurse/RN and consider discussing with provider and RRT  Notify: Charge Nurse/RN  Name of Charge Nurse/RN Notified Tanya, RN  Date Charge Nurse/RN Notified 11/24/20  Time Charge Nurse/RN Notified [phone]  Document  Patient Outcome Other (Comment) (remains on dept)  Progress note created (see row info) Yes

## 2020-11-24 NOTE — Progress Notes (Signed)
Albumin 25 gm given tolerated well.

## 2020-11-24 NOTE — Progress Notes (Addendum)
I have seen and examined the patient. I have personally reviewed the clinical findings, laboratory findings, microbiological data and imaging studies. The assessment and treatment plan was discussed with the  Advance Practice Provider, Janene Madeira.  I agree with her/his recommendations except following additions/corrections.  Afebrile, moaning ( although less than before), On Nasal cannula from 3L to 4L Blood cx 2/18 NG in 3 days Chest xray with stable multifocal and cavitary PNA  Exam suggestive of fluid overload with edematous extremities  Left ankle and foot is still erythematous/painful and tender    Nephrology following for AKI, Cr seems to be uptrending  CT Sx following for possible surgical intervention   Continue Daptomycin, CPK 45 Completed 5 days of tx with Doxycycline  Recommend MRI of left foot and ankle Monitor for respiratory status  Following   Rosiland Oz, MD [place] [place] [facility]  Date of Admission:  11/15/2020      Total days of antibiotics 10  Daptomycin 2/16 >> current  Doxycycline 2/16 >> current   Vancomycin 2/12 >> 2/15     ASSESSMENT: Anne Bautista is a 35 y.o. 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   Her right shoulder has grown out MRSA. No plans for surgical intervention at this time. She seems to have unrestricted pROM presently - would have very low threshold to clean this out.   Her left ankle/foot is not getting much better after 10 days and still red, warm and tender with joint manipulation. Will proceed with MRI of the left foot and ankle to better assess for possible septic arthritis.   Dr. Kipp Brood following for possible angiovac. She has cleared her cultures, remains afebrile and only mild intermittent  leukocytosis.   AKI - nephrology following. Follow CK. Extravascular spacing - getting albumin and lasix today. Not much movement in her creatinine.   Sed Rate (mm/hr)  Date Value  11/18/2020 112 (H)   CRP (mg/dL)  Date Value  11/18/2020 18.6 (H)     PLAN: 1. Continue Daptomycin  2. Follow CK 3. Finish doxycycline today  4. Follow up CXRs and clinical progress for PNA - at risk for empyema 5. MRI of L ankle/foot 6. Follow R shoulder clinical progress.  7. Check ESR, CRP in AM 8. Hep C RNA in AM (cancelled previously d/t insufficient vol)    Principal Problem:   Severe sepsis due to methicillin resistant Staphylococcus aureus (MRSA) with acute organ dysfunction (HCC) Active Problems:   IV drug abuse (HCC)   Poor dentition   Malnutrition (HCC)   Thrombocytopenia (HCC)   Tobacco dependence   Acute cystitis without hematuria   Malnutrition of moderate degree   . acetylcysteine  4 mL Nebulization Q6H  . chlorhexidine  15 mL Mouth Rinse BID  . Chlorhexidine Gluconate Cloth  6 each Topical Daily  . docusate sodium  100 mg Oral BID  . feeding supplement  237 mL Oral TID BM  . folic acid  1 mg Oral Daily  . heparin  5,000 Units Subcutaneous Q8H  . mouth rinse  15 mL Mouth Rinse q12n4p  . methadone  10 mg Oral Q8H  . multivitamin with minerals  1 tablet Oral Daily  . polyethylene glycol  17 g Oral BID  . QUEtiapine  25 mg Oral BID  . senna  2 tablet Oral Daily  . sodium chloride flush  10-40 mL Intracatheter Q12H  . sodium chloride flush  3 mL Intravenous Q12H  . thiamine  100 mg Oral Daily    SUBJECTIVE: Moaning. No trouble with light touch over the left ankle but when picked up to manipulate the joint / foot she verbalizes pain and discomfort.     Review of Systems: Review of Systems  Unable to perform ROS: Medical condition  Does not willingly answer many of our questions.    No Known Allergies  OBJECTIVE: Vitals:   11/24/20 0530 11/24/20 0644 11/24/20  0805 11/24/20 1103  BP: (!) 149/104 (!) 148/106    Pulse: (!) 115 (!) 115    Resp: 20 (!) 21    Temp:  98.9 F (37.2 C)    TempSrc:  Oral    SpO2: 90% 94% 91% 96%  Weight:      Height:       Body mass index is 28.56 kg/m.  Physical Exam Constitutional:      Comments: Lethargic but awakens with verbal and tactile stimuli.   HENT:     Mouth/Throat:     Mouth: Mucous membranes are dry.     Pharynx: Oropharynx is clear.  Eyes:     Pupils: Pupils are equal, round, and reactive to light.  Cardiovascular:     Rate and Rhythm: Tachycardia present.     Heart sounds: No murmur heard.   Pulmonary:     Breath sounds: Rhonchi present.     Comments: Congested cough, unable to mobilize secretions  Abdominal:     General: There is no distension.     Palpations: Abdomen is soft.  Musculoskeletal:        General: Normal range of motion.     Comments: Generalized swelling  Skin:    General: Skin is warm and dry.     Coloration: Skin is pale.     Findings: No rash.  Neurological:     Comments: Oriented to place.      Lab Results Lab Results  Component Value Date   WBC 10.4 11/24/2020   HGB 7.4 (L) 11/24/2020   HCT 22.9 (L) 11/24/2020   MCV 82.4 11/24/2020   PLT 205 11/24/2020    Lab Results  Component Value Date   CREATININE 2.75 (H) 11/24/2020   BUN 82 (H) 11/24/2020   NA 134 (L) 11/24/2020   K 5.1 11/24/2020   CL 106 11/24/2020   CO2 19 (L) 11/24/2020    Lab Results  Component Value Date   ALT 11 11/21/2020   AST 18 11/21/2020   ALKPHOS 89 11/21/2020   BILITOT 1.2 11/21/2020     Microbiology: BCx 2/12 >> MRSA 3/4 bottles BCx 2/14 >> MRSA 1/4 bottles  BCx 2/17 >> negative pending    Janene Madeira, MSN, NP-C [place] [facility] [email or web address] Pager: [phone] Office: [phone] [place]: [phone]

## 2020-11-24 NOTE — Progress Notes (Signed)
Nephrology Follow-Up Consult note   Assessment/Recommendations: Anne Bautista is a/an 34 y.o. female with a past medical history significant for IVDU, tobacco use disorder, malnutrition who present w/ likely endocarditis with sepsis  Non-Oliguric AKI: Creatinine on arrival 1.3 improved to 0.8.  Urinalysis with minimal proteinuria and no significant hematuria making infection associated GN less likely.  More likely a mix of ATN from sepsis and septic embolization as well as contrast associated injury. -Care at this time is supportive; hopeful for Cr plateau  - lasix/albumin once today   Sepsis/MRSA bacteremia/endocarditis: Antibiotics and further management per primary team and infectious disease.    Blood pressure - HTN - giving lasix and albumin  Anasarca: Related to volume overload, acute illness, hypoalbuminemia. Continue to monitor. Hopefully will resolve with the patient improves  Hyperkalemia: Improved with Lokelma - repeat on 2/21 Continue to monitor  Anemia: Multifactorial with kidney disease felt to be contributing minimally. PRBC for Hb 7 or less   Hypoxic respiratory failure: Minimal oxygen requirement at this time.  Continue to monitor per primary team  Severe malnutrition: Dietary involved.  Hyponatremia: Sodium improving as AKI resolving. Continue to monitor   ___________________________________________________________  CC: AKI  Interval History/Subjective:  Just moved from the ICU to 2C.  UOP at 1.3 liters over 2/20. Frustrated can't get remote to work   Review of systems:  Denies n/v Denies chest pain  Reports shortness of breath    Medications:  Current Facility-Administered Medications  Medication Dose Route Frequency Provider Last Rate Last Admin  . 0.9 %  sodium chloride infusion   Intravenous PRN Steffanie Dunn, DO   Stopped at 11/23/20 2116  . acetaminophen (TYLENOL) tablet 650 mg  650 mg Oral Q6H PRN Jonah Blue, MD   650 mg at 11/20/20 2224    Or  . acetaminophen (TYLENOL) suppository 650 mg  650 mg Rectal Q6H PRN Jonah Blue, MD      . bisacodyl (DULCOLAX) suppository 10 mg  10 mg Rectal Daily PRN Dellia Cloud, MD      . chlorhexidine (PERIDEX) 0.12 % solution 15 mL  15 mL Mouth Rinse BID Karie Fetch P, DO   15 mL at 11/23/20 2124  . Chlorhexidine Gluconate Cloth 2 % PADS 6 each  6 each Topical Daily Kirtland Bouchard, MD   6 each at 11/23/20 1032  . DAPTOmycin (CUBICIN) 770 mg in sodium chloride 0.9 % IVPB  10 mg/kg Intravenous Q2000 Odette Fraction, MD   Stopped at 11/23/20 2042  . docusate sodium (COLACE) capsule 100 mg  100 mg Oral BID Max Fickle B, MD   100 mg at 11/23/20 2123  . feeding supplement (ENSURE ENLIVE / ENSURE PLUS) liquid 237 mL  237 mL Oral TID BM Karie Fetch P, DO   237 mL at 11/23/20 2125  . folic acid (FOLVITE) tablet 1 mg  1 mg Oral Daily Jonah Blue, MD   1 mg at 11/23/20 0934  . heparin injection 5,000 Units  5,000 Units Subcutaneous Q8H Leslye Peer, MD   5,000 Units at 11/24/20 [id number]  . HYDROmorphone (DILAUDID) injection 1 mg  1 mg Intravenous Q4H PRN Leslye Peer, MD   1 mg at 11/21/20 1916  . LORazepam (ATIVAN) injection 0.5-1 mg  0.5-1 mg Intravenous Q6H PRN Max Fickle B, MD   0.5 mg at 11/23/20 2002  . MEDLINE mouth rinse  15 mL Mouth Rinse q12n4p [place], Vultaggio P, DO   15 mL at 11/23/20 1520  . methadone (  DOLOPHINE) tablet 10 mg  10 mg Oral Q8H Leslye Peer, MD   10 mg at 11/24/20 0511  . metoprolol tartrate (LOPRESSOR) injection 2.5 mg  2.5 mg Intravenous Q6H PRN Regalado, Belkys A, MD   2.5 mg at 11/23/20 2119  . multivitamin with minerals tablet 1 tablet  1 tablet Oral Daily Odette Fraction, MD   1 tablet at 11/22/20 1700  . nicotine (NICODERM CQ - dosed in mg/24 hours) patch 14 mg  14 mg Transdermal Daily PRN Jonah Blue, MD      . oxyCODONE (Oxy IR/ROXICODONE) immediate release tablet 5 mg  5 mg Oral Q6H PRN Leslye Peer, MD   5 mg at 11/24/20 7017  .  polyethylene glycol (MIRALAX / GLYCOLAX) packet 17 g  17 g Oral BID Max Fickle B, MD   17 g at 11/23/20 2123  . QUEtiapine (SEROQUEL) tablet 25 mg  25 mg Oral BID Dellia Cloud, MD   25 mg at 11/23/20 2123  . senna (SENOKOT) tablet 17.2 mg  2 tablet Oral Daily Dellia Cloud, MD   17.2 mg at 11/23/20 0934  . sodium chloride flush (NS) 0.9 % injection 10-40 mL  10-40 mL Intracatheter Q12H Leslye Peer, MD   10 mL at 11/23/20 2125  . sodium chloride flush (NS) 0.9 % injection 10-40 mL  10-40 mL Intracatheter PRN Leslye Peer, MD      . sodium chloride flush (NS) 0.9 % injection 3 mL  3 mL Intravenous Q12H Jonah Blue, MD   3 mL at 11/23/20 2125  . thiamine tablet 100 mg  100 mg Oral Daily Jonah Blue, MD   100 mg at 11/23/20 7939      Review of Systems: 10 systems reviewed and negative except per interval history/subjective  Physical Exam: Vitals:   11/24/20 0530 11/24/20 0644  BP: (!) 149/104 (!) 148/106  Pulse: (!) 115 (!) 115  Resp: 20 (!) 21  Temp:  98.9 F (37.2 C)  SpO2: 90% 94%   No intake/output data recorded.  Intake/Output Summary (Last 24 hours) at 11/24/2020 0741 Last data filed at 11/24/2020 0500 Gross per 24 hour  Intake 360.96 ml  Output 1250 ml  Net -889.04 ml   General adult female in bed HEENT normocephalic atraumatic extraocular movements intact sclera anicteric Neck supple trachea midline Lungs reduced breath sounds; tachypneic HeartS1S2 no rub Abdomen soft nontender nondistended Extremities 1+ edema  Psych anxious mood and affect Neuro alert and conversant    Test Results I personally reviewed new and old clinical labs and radiology tests Lab Results  Component Value Date   NA 134 (L) 11/24/2020   K 5.1 11/24/2020   CL 106 11/24/2020   CO2 19 (L) 11/24/2020   BUN 82 (H) 11/24/2020   CREATININE 2.75 (H) 11/24/2020   CALCIUM 7.3 (L) 11/24/2020   ALBUMIN 1.4 (L) 11/23/2020   PHOS 7.4 (H) 11/24/2020    Estanislado Emms,  MD 11/24/2020 7:55 AM

## 2020-11-24 NOTE — TOC Progression Note (Addendum)
Transition of Care [facility]) - Progression Note    Patient Details  Name: Anne Bautista MRN: [id number] Date of Birth: 1986-07-29  Transition of Care [facility] CM/SW Contact  Erin Sons, [place] Phone Number: 11/24/2020, 12:38 PM  Clinical Narrative:      CSW left voicemail with financial navigator at [phone] for medicaid screening.   1445: CSW receives call back from financial navigator Princess Perna and is informed that pt has been [facility] who will complete a medicaid screening.       Expected Discharge Plan and Services                                                 Social Determinants of Health (SDOH) Interventions    Readmission Risk Interventions No flowsheet data found.

## 2020-11-24 NOTE — Progress Notes (Signed)
Would constantly moan, when asked if she is in pain, would say no. repositioned  for comfort. Continue to monitor.

## 2020-11-24 NOTE — Progress Notes (Signed)
NAME:  Anne Bautista, MRN:  [id number], DOB:  1986-01-12, LOS: 8 ADMISSION DATE:  11/15/2020, CONSULTATION DATE:  11/17/2020 REFERRING MD:  Sunnie Nielsen, CHIEF COMPLAINT:  Sepsis  Brief History:  35 year old female presented 2/12 with cough and myalgias, found to be bacteremic (S. Aureus) with known history of IVDU.  History of Present Illness:  Anne Bautista is a 35 year old female with PMH significant for IVDU, tobacco abuse, and malnutrition who presented to the ED with complaints of one week history of cough and myalgias (reporting "everything hurts") and fever (Tmax 101.5).  Patient reports long running history of IV drug abuse with recent relapse 3 days prior to admission.  She does report prior history of endocarditis with hospitalization approximately 4 years ago where she left AGAINST MEDICAL ADVICE.  On arrival patient was seen afebrile, tachycardic, and tachypneic.  Significant lab work included: Sodium 124, potassium 2.6, glucose 128, BUN 40, creatinine 1.33, albumin 2.1 lactic acid 3.6, WBC 15.3, platelets 51.  Toxicology positive for cocaine urinalysis positive for trace leukocytes and nitrates.  Admission chest x-ray significant for bilateral lung opacities likely consistent with residual scarring from pulmonary emboli.  CT abdomen and pelvis consistent with multiple subtle hypoenhancing lesions of the renal cortical consistent with septic emboli and splenomegaly.  CTA chest negative for PE but revealed numerous bilateral cavitary nodules throughout the lungs most consistent with septic emboli.  Patient was initially admitted to hospitalist team for further management and treatment of bacteremia.  However morning of 2/14 patient began developing worsening signs of severe sepsis including fever with T-max of 100.2, tachycardia with a heart rate of 140, tachypnea with respiratory rates in the upper 40s to 50s.  Blood cultures positive for Staph aureus. PCCM was consulted for further management  and patient transferred to the ICU on 11/17/2020.  Past Medical History:  IV drug abuse Tobacco abuse Malnutrition Endocarditis  Significant Hospital Events:  2/13 Admitted 2/14 PCCM consulted  Consults:  Infectious Disease Cardiothoracic Surgery  Procedures:  2/14 Right shoulder aspiration   Significant Diagnostic Tests:   CT A/P 2/12 >> mult. Hypo-enhancing lesions renal cortices, suspect septic emboli; splenomegaly  CTA Chest 2/12 >> no PE; bilateral, numerous cavitary nodules, likely septic emboli vs vasculitis vs metastatic disease; pleural effusions, trace right, small left  CT Head w/o Contrast 2/12 >> brain appears normal; right maxillary sinus, possible rhinosinusitis  ECHO 2/14 >> positive for vegetation on tricuspid valve  LE Dopplers 2/14 >> negative for DVT bilaterally  R Shoulder Joint Aspiration >>  Rare Staph aureus, replating  Micro Data:  2/12 COVID >> negative 2/12 BCx >> positive for Staph aureus 2/12 UCx >> positive Staph aureus 2/14 R Shoulder aspiration >> positive Staph aureus  Antimicrobials:  Cefepime 2/12 >> 2/13 Flagyl 2/13 >> 2/13 Vancomycin 2/12 >> 2/16 Doxycycline 2/16 >> 2/20 Daptomycin 2/16 >>  Interim History / Subjective:  Transferred out of ICU early this AM Reports feeling "scared', "because of my breathing" Persistently moaning, sometimes unintelligibly Satting well on 3L [place], though tachypneic to 30s Coughing more, sounds wet/productive  Objective   Blood pressure (!) 148/106, pulse (!) 115, temperature 98.9 F (37.2 C), temperature source Oral, resp. rate (!) 21, height 5\' 7"  (1.702 m), weight 82.7 kg, last menstrual period 11/10/2020, SpO2 91 %.        Intake/Output Summary (Last 24 hours) at 11/24/2020 0830 Last data filed at 11/24/2020 0500 Gross per 24 hour  Intake 240.96 ml  Output 1250 ml  Net -1009.04 ml  Filed Weights   11/17/20 0515 11/17/20 1100 11/24/20 0330  Weight: 78 kg 77 kg 82.7 kg     Examination: General: Chronically ill-appearing woman, intermittently moaning, appears uncomfortable, NAD. HEENT: Anicteric sclera, PERRL, moist mucous membranes. Poor dentition. Neuro: Awake/alert, oriented. Answering questions appropriately. Speaking in full sentences. Moves all 4 extremities spontaneously. No focal neuro deficits noted. CV: Tachycardic, regular rhythm. No m/g/r. PULM: Tachypneic, breathing minimally labored on 3LNC. Poor respiratory effort on exam. Lung fields with rhonchi in upper air fields, clearing with cough. Bibasilar crackles. GI: Soft, nondistended, mild generalized TTP. Extremities: Symmetric 1+ pitting edema to BLE, symmetric 1-2+ pitting edema to BUE Skin: LLE erythema present, improving   Resolved Hospital Problem list     Assessment & Plan:  Severe Sepsis with MRSA bacteremia in the setting of IV drug use Endocarditis (hx of endocarditis w/ sub-optimal abx regimen (4 weeks, not 6)) Septic emboli to kidneys and lungs - Continue daptomycin - Appreciate ID/thoracic surgery evaluations - Continue to monitor respiratory status/CXR - Sedating meds as below to avoid respiratory suppression  Acute respiratory failure, tachypnea In part due to narcotics withdrawal superimposed on bilateral embolic pneumonias. Appears to have avoided intubation/MV at this point. Ongoing concern for worsening of cavitary PNA. - Continue supportive care - Daily CXR - Wean FiO2 as able for sat > 90% - Chest PT/Mucomyst nebs added for secretion clearance - Mobilize at least once per shift  IV drug use, tobacco use, cocaine use, marijuana use Anxiety Reportedly somnolent 2/19, with improving mental status 2/20 - 2/21. Methodone dose decreased 2/19. - Continue Methadone at current dose - Continue scheduled oxycodone - Dilaudid PRN - Continue Seroquel - Maintain bowel regimen - Nicotine replacement  Acute Thrombocytopenia  Acute Anemia  Likely multifactorial in the  setting of sepsis vs. drug use. Spleen is enlarged on CT on 2/12.  - Trend CBC - Hgb goal > 7.0, Plt > 10K (50K if evidence of active bleeding/blood loss)  AKI, Severe Sepsis and Nephrotoxic Medications: Hyperkalemia Hyponatremia Oliguria Acute volume overload We will continue to monitor closely. Has been receiving fluids. Stopped 2/2 increased hypervolemia on exam, likely extravascular edema/third spacing. She continues to have worsening kidney function with evidence of hyperkalemia and volume overload. Urine output marginal. - Appreciate Nephrology assistance - Trend BMP, UOP - Albumin/Lasix today, 2/21 - Daily Lokelma (pending AM labs, hold today in the setting of Lasix administration) - Avoid nephrotoxic agents as able - Maintain adequate perfusion pressure  Right shoulder effusion, septic arthritis vs reactive arthritis  R shoulder aspirated, fluid evaluated. Septic joint possible even with WBCs in the 8K range, gram stain abundant WBCs, few neutrophils, grew staph. Possibly inflammatory vs reactive as well. Culture grew MRSA. Ortho is following for possible washout once patient improves clinically.   - Appreciate Ortho assistance, no role for surgical intervention at this time - Continue dapto as above - Pain control  Cellulitis on Left ankle Cellulitis is appears to be progressing since starting AB. Likely secondary to toxin release since starting AB. Will continue to follow. XR shows no signs of soft tissue gas. Physical exam negative for crepitus. - Continue antibiotic coverage - Improving clinically  Acute-on-chronic constipation Very limited movement, chronic opioid use and current opioid use. Poor PO intake. No BM this admit and patient unable to recall last BM. - Maintain aggressive bowel regimen in the setting of chronic opioid use - Mobilize as able (OOB, OOBTC)  Malnutrition Multiple likely etiologies including poor dentition, IVDU. - SLP  reevaluation -  ADAT  Best practice (evaluated daily)  Diet: Full Liquids, ADAT pending SLP reevaluation Pain/Anxiety/Delirium protocol (if indicated): Dilaudid, Oxy IR, Ativan VAP protocol (if indicated): N/A DVT prophylaxis: SQH GI prophylaxis: NA Glucose control: NA Mobility: Per PT recs Disposition: Progressive Care, PCCM off 2/21, to [facility] 2/22  Goals of Care:  Last date of multidisciplinary goals of care discussion: N/A Family and staff present: None Summary of discussion: None Follow up goals of care discussion due: None Code Status: FULL  Critical care time:   Faythe Ghee [place] [facility] 11/24/20 9:08 AM  Please see [email or web address] for pager details.

## 2020-11-24 NOTE — Progress Notes (Signed)
Refused to go down for MRI at this time, claimed to go later. Dept made aware.

## 2020-11-24 NOTE — Progress Notes (Signed)
Occupational Therapy Treatment Patient Details Name: Anne Bautista MRN: [id number] DOB: 04-24-1986 Today's Date: 11/24/2020    History of present illness 35 yo admitted 2/14 with sepsis due to MRSA bacteremia in setting of IVDU. Rt shoulder pain s/p aspiration 2/14. PMHx: IVDU, malnutrition, poor dentition   OT comments  Pt making steady progress towards OT goals this session. Pt continues to present with increased generalized pain, decreased activity tolerance and self limiting at times. Pt was able to mobilize OOB with MIN A +2 to stand pivot from EOB >recliner with RW. Pt on 4L [place] during session with sats WFL during session, HR increase to 120s with mobility, RR increase to as much as 33 breaths per minute. Pt would continue to benefit from skilled occupational therapy while admitted and after d/c to address the below listed limitations in order to improve overall functional mobility and facilitate independence with BADL participation. DC plan remains appropriate, will follow acutely per POC.     Follow Up Recommendations  SNF;Supervision/Assistance - 24 hour    Equipment Recommendations  None recommended by OT    Recommendations for Other Services      Precautions / Restrictions Precautions Precautions: Fall Precaution Comments: right shoulder, left ankle, back pain, watch sats/RR, bil UE edema Restrictions Weight Bearing Restrictions: No       Mobility Bed Mobility Overal bed mobility: Needs Assistance Bed Mobility: Supine to Sit     Supine to sit: Min assist;HOB elevated     General bed mobility comments: increased effort/ time to mobilize EOB, MIN A overall needing most assist to scoot hips to EOB with used of bed pad  Transfers Overall transfer level: Needs assistance Equipment used: Rolling walker (2 wheeled) Transfers: Sit to/from Stand Sit to Stand: Min assist;+2 physical assistance Stand pivot transfers: Min assist;+2 physical assistance       General  transfer comment: MIN A +2 to rise into standing needing assist to power up and for initial steadying assist, MIN A +2 for safety to pivot to recliner mosty for safety awareness as pt distracted by pain attempting to sit prematurely into recliner    Balance Overall balance assessment: Needs assistance Sitting-balance support: No upper extremity supported;Feet supported Sitting balance-Leahy Scale: Poor Sitting balance - Comments: minguard for static sitting balance   Standing balance support: Bilateral upper extremity supported Standing balance-Leahy Scale: Poor Standing balance comment: reliant on trunk and UE support                           ADL either performed or assessed with clinical judgement   ADL Overall ADL's : Needs assistance/impaired Eating/Feeding: Supervision/ safety;Set up;Sitting Eating/Feeding Details (indicate cue type and reason): improved grip strength with pt able to self feed with set- up assist                     Toilet Transfer: Minimal assistance;+2 for physical assistance;RW;Stand-pivot Toilet Transfer Details (indicate cue type and reason): MIN A +2 for simulated stand pivot from EOB>recliner with RW         Functional mobility during ADLs: Minimal assistance;+2 for physical assistance;Rolling walker General ADL Comments: pt limited by generalized discomfort, decreased activity tolerance and impaired awareness as pt noted to be saturated in urine with no urgency to exit bed     Vision       Perception     Praxis      Cognition Arousal/Alertness: Awake/alert Behavior During Therapy:  Flat affect (rarely conversating with COTA but moaning and crying throuhgout session) Overall Cognitive Status: Impaired/Different from baseline Area of Impairment: Attention;Following commands;Safety/judgement;Awareness;Problem solving                   Current Attention Level: Sustained   Following Commands: Follows one step commands  with increased time Safety/Judgement: Decreased awareness of safety;Decreased awareness of deficits Awareness: Intellectual Problem Solving: Slow processing;Decreased initiation;Difficulty sequencing;Requires verbal cues;Requires tactile cues General Comments: pt continues to be self limiting at times needing max encouragment to mobilize, pt following commands with increased time. very slow to initiate and needs encouragment to keep going.        Exercises Other Exercises Other Exercises: ankle pumps from recliner x10 reps BLEs Other Exercises: wrist flexion/ extension x10 from recliner BUEs Other Exercises: encouraged pt to completed BUE/ BLE therex while in recliner to increase activity tolerance and as edema mgmt strategy in Bil wrists and ankles   Shoulder Instructions       General Comments pt on 4L during session with sats WFL during session, HR increase to 120s with mobility, RR increase to as much as 33 breaths per minute    Pertinent Vitals/ Pain       Pain Assessment: Faces Faces Pain Scale: Hurts whole lot Pain Location: generalized Pain Descriptors / Indicators: Aching;Guarding;Moaning;Crying;Discomfort;Constant Pain Intervention(s): Monitored during session;Repositioned  Home Living                                          Prior Functioning/Environment              Frequency  Min 2X/week        Progress Toward Goals  OT Goals(current goals can now be found in the care plan section)  Progress towards OT goals: Progressing toward goals  Acute Rehab OT Goals Patient Stated Goal: to not be in pain OT Goal Formulation: With patient Time For Goal Achievement: 12/03/20 Potential to Achieve Goals: Good  Plan Discharge plan remains appropriate;Frequency remains appropriate    Co-evaluation                 AM-PAC OT "6 Clicks" Daily Activity     Outcome Measure   Help from another person eating meals?: A Little (s/u) Help from  another person taking care of personal grooming?: A Lot Help from another person toileting, which includes using toliet, bedpan, or urinal?: A Lot Help from another person bathing (including washing, rinsing, drying)?: A Lot Help from another person to put on and taking off regular upper body clothing?: A Lot Help from another person to put on and taking off regular lower body clothing?: Total 6 Click Score: 12    End of Session Equipment Utilized During Treatment: Gait belt;Rolling walker;Oxygen;Other (comment) (4L [place])  OT Visit Diagnosis: Unsteadiness on feet (R26.81);Muscle weakness (generalized) (M62.81);Other symptoms and signs involving cognitive function;Pain Pain - part of body:  (generalized pain with all mobility)   Activity Tolerance Patient tolerated treatment well   Patient Left in chair;with call bell/phone within reach;with chair alarm set   Nurse Communication Mobility status;Other (comment) (can we get her up to [facility] vs purewick)        Time: 1410-1447 OT Time Calculation (min): 37 min  Charges: OT General Charges $OT Visit: 1 Visit OT Treatments $Therapeutic Activity: 23-37 mins  Lenor [name]., COTA/L Acute Rehabilitation Services [phone]  [phone]    Barron Schmid 11/24/2020, 3:37 PM

## 2020-11-25 ENCOUNTER — Inpatient Hospital Stay (HOSPITAL_COMMUNITY): Payer: Self-pay

## 2020-11-25 LAB — IRON AND TIBC
Iron: 11 ug/dL — ABNORMAL LOW (ref 28–170)
Saturation Ratios: 8 % — ABNORMAL LOW (ref 10.4–31.8)
TIBC: 136 ug/dL — ABNORMAL LOW (ref 250–450)
UIBC: 125 ug/dL

## 2020-11-25 LAB — BASIC METABOLIC PANEL
Anion gap: 10 (ref 5–15)
BUN: 68 mg/dL — ABNORMAL HIGH (ref 6–20)
CO2: 19 mmol/L — ABNORMAL LOW (ref 22–32)
Calcium: 7.8 mg/dL — ABNORMAL LOW (ref 8.9–10.3)
Chloride: 103 mmol/L (ref 98–111)
Creatinine, Ser: 2.79 mg/dL — ABNORMAL HIGH (ref 0.44–1.00)
GFR, Estimated: 22 mL/min — ABNORMAL LOW (ref >60)
Glucose, Bld: 125 mg/dL — ABNORMAL HIGH (ref 70–99)
Potassium: 4.6 mmol/L (ref 3.5–5.1)
Sodium: 132 mmol/L — ABNORMAL LOW (ref 135–145)

## 2020-11-25 LAB — CBC
HCT: 20.4 % — ABNORMAL LOW (ref 36.0–46.0)
Hemoglobin: 6.7 g/dL — CL (ref 12.0–15.0)
MCH: 27 pg (ref 26.0–34.0)
MCHC: 32.8 g/dL (ref 30.0–36.0)
MCV: 82.3 fL (ref 80.0–100.0)
Platelets: 205 10*3/uL (ref 150–400)
RBC: 2.48 MIL/uL — ABNORMAL LOW (ref 3.87–5.11)
RDW: 16.2 % — ABNORMAL HIGH (ref 11.5–15.5)
WBC: 9.7 10*3/uL (ref 4.0–10.5)
nRBC: 0 % (ref 0.0–0.2)

## 2020-11-25 LAB — RETICULOCYTES
Immature Retic Fract: 10.2 % (ref 2.3–15.9)
RBC.: 2.44 MIL/uL — ABNORMAL LOW (ref 3.87–5.11)
Retic Count, Absolute: 27.6 10*3/uL (ref 19.0–186.0)
Retic Ct Pct: 1.1 % (ref 0.4–3.1)

## 2020-11-25 LAB — C-REACTIVE PROTEIN: CRP: 19.7 mg/dL — ABNORMAL HIGH (ref <1.0)

## 2020-11-25 LAB — HEMOGLOBIN AND HEMATOCRIT, BLOOD
HCT: 25.3 % — ABNORMAL LOW (ref 36.0–46.0)
Hemoglobin: 8.2 g/dL — ABNORMAL LOW (ref 12.0–15.0)

## 2020-11-25 LAB — FOLATE: Folate: 11.4 ng/mL (ref >5.9)

## 2020-11-25 LAB — VITAMIN B12: Vitamin B-12: 2498 pg/mL — ABNORMAL HIGH (ref 180–914)

## 2020-11-25 LAB — FERRITIN: Ferritin: 517 ng/mL — ABNORMAL HIGH (ref 11–307)

## 2020-11-25 LAB — PHOSPHORUS: Phosphorus: 7.5 mg/dL — ABNORMAL HIGH (ref 2.5–4.6)

## 2020-11-25 LAB — ABO/RH: ABO/RH(D): B POS

## 2020-11-25 LAB — MAGNESIUM: Magnesium: 2.3 mg/dL (ref 1.7–2.4)

## 2020-11-25 LAB — PREPARE RBC (CROSSMATCH)

## 2020-11-25 LAB — PROCALCITONIN: Procalcitonin: 0.86 ng/mL

## 2020-11-25 LAB — SEDIMENTATION RATE: Sed Rate: 130 mm/hr — ABNORMAL HIGH (ref 0–22)

## 2020-11-25 MED ORDER — PANTOPRAZOLE SODIUM 40 MG PO TBEC
40.0000 mg | DELAYED_RELEASE_TABLET | Freq: Every day | ORAL | Status: DC
  Administered 2020-11-25 – 2020-12-29 (×33): 40 mg via ORAL
  Filled 2020-11-25 (×33): qty 1

## 2020-11-25 MED ORDER — ACETYLCYSTEINE 20 % IN SOLN
4.0000 mL | Freq: Four times a day (QID) | RESPIRATORY_TRACT | Status: DC
  Administered 2020-11-25 – 2020-11-26 (×5): 4 mL via RESPIRATORY_TRACT
  Filled 2020-11-25 (×9): qty 4

## 2020-11-25 MED ORDER — SODIUM CHLORIDE 0.9% IV SOLUTION: Freq: Once | INTRAVENOUS | Status: DC

## 2020-11-25 NOTE — Progress Notes (Signed)
Nephrology Follow-Up Consult note   Assessment/Recommendations: Anne Bautista is a/an 35 y.o. female with a past medical history significant for IVDU, tobacco use disorder, malnutrition who present w/ likely endocarditis with sepsis  Non-Oliguric AKI: Creatinine on arrival 1.3 improved to 0.8.  Urinalysis with minimal proteinuria and no significant hematuria making infection associated GN less likely.  More likely a mix of ATN from sepsis and septic embolization as well as contrast associated injury. - await AM labs  -Care at this time is supportive; hopeful for Cr plateau   Sepsis/MRSA bacteremia/endocarditis: Antibiotics and further management per primary team and infectious disease.    HTN - improved.     Anasarca: Related to volume overload, acute illness, hypoalbuminemia. improving  Hyperkalemia: Improved with Lokelma. Await labs.   Anemia: Multifactorial with kidney disease felt to be contributing less of a factor. PRBC for Hb 7 or less.  Per RN she has spoken with team and they are ordering PRBC's - agree.   Hypoxic respiratory failure: Minimal oxygen requirement at this time  Severe malnutrition: optimize nutrition per primary team   Hyponatremia: Sodium improving as AKI resolving   _____________________________________________________  Interval History/Subjective:  Patient states maybe feels a little better today.  Labs not back yet.  UOP at 2.4 liters over 2/21.  She has been on 4 liters oxygen.  RN states that she has spoken to primary team and they are ordering 1 unit PRBC's.   Review of systems:    Denies n/v or diarrhea Reports shortness of breath a little better Generalized discomfort about the same  Medications:  Current Facility-Administered Medications  Medication Dose Route Frequency Provider Last Rate Last Admin  . 0.9 %  sodium chloride infusion   Intravenous PRN Steffanie Dunn, DO   Stopped at 11/23/20 2116  . acetaminophen (TYLENOL) tablet 650 mg   650 mg Oral Q6H PRN Jonah Blue, MD   650 mg at 11/25/20 3614   Or  . acetaminophen (TYLENOL) suppository 650 mg  650 mg Rectal Q6H PRN Jonah Blue, MD      . acetylcysteine (MUCOMYST) 20 % nebulizer / oral solution 4 mL  4 mL Nebulization Q6H Cloyd Stagers M, PA-C   4 mL at 11/25/20 0319  . bisacodyl (DULCOLAX) suppository 10 mg  10 mg Rectal Daily PRN Dellia Cloud, MD      . chlorhexidine (PERIDEX) 0.12 % solution 15 mL  15 mL Mouth Rinse BID Karie Fetch P, DO   15 mL at 11/24/20 2140  . Chlorhexidine Gluconate Cloth 2 % PADS 6 each  6 each Topical Daily Kirtland Bouchard, MD   6 each at 11/24/20 [phone]  . DAPTOmycin (CUBICIN) 827 mg in sodium chloride 0.9 % IVPB  10 mg/kg Intravenous Q2000 Earnie Larsson, RPH 233.1 mL/hr at 11/24/20 2140 827 mg at 11/24/20 2140  . docusate sodium (COLACE) capsule 100 mg  100 mg Oral BID Max Fickle B, MD   100 mg at 11/24/20 2140  . feeding supplement (ENSURE ENLIVE / ENSURE PLUS) liquid 237 mL  237 mL Oral TID BM Karie Fetch P, DO   237 mL at 11/23/20 2125  . folic acid (FOLVITE) tablet 1 mg  1 mg Oral Daily Jonah Blue, MD   1 mg at 11/24/20 0903  . heparin injection 5,000 Units  5,000 Units Subcutaneous Q8H Leslye Peer, MD   5,000 Units at 11/25/20 [phone]  . HYDROmorphone (DILAUDID) injection 1 mg  1 mg Intravenous Q4H PRN Leslye Peer,  MD   1 mg at 11/21/20 1916  . levalbuterol (XOPENEX) 0.63 MG/3ML nebulizer solution           . levalbuterol (XOPENEX) nebulizer solution 0.63 mg  0.63 mg Nebulization Q6H PRN Leslye Peer, MD   0.63 mg at 11/25/20 0318  . LORazepam (ATIVAN) injection 0.5-1 mg  0.5-1 mg Intravenous Q6H PRN Max Fickle B, MD   0.5 mg at 11/23/20 2002  . MEDLINE mouth rinse  15 mL Mouth Rinse q12n4p Mellody, Masri P, DO   15 mL at 11/24/20 1615  . methadone (DOLOPHINE) tablet 10 mg  10 mg Oral Q8H Leslye Peer, MD   10 mg at 11/25/20 0506  . metoprolol tartrate (LOPRESSOR) injection 2.5 mg  2.5 mg  Intravenous Q6H PRN Regalado, Belkys A, MD   2.5 mg at 11/23/20 2119  . multivitamin with minerals tablet 1 tablet  1 tablet Oral Daily Odette Fraction, MD   1 tablet at 11/24/20 1614  . nicotine (NICODERM CQ - dosed in mg/24 hours) patch 14 mg  14 mg Transdermal Daily PRN Jonah Blue, MD      . oxyCODONE (Oxy IR/ROXICODONE) immediate release tablet 5 mg  5 mg Oral Q6H PRN Leslye Peer, MD   5 mg at 11/25/20 0506  . polyethylene glycol (MIRALAX / GLYCOLAX) packet 17 g  17 g Oral BID Max Fickle B, MD   17 g at 11/24/20 2141  . QUEtiapine (SEROQUEL) tablet 25 mg  25 mg Oral BID Dellia Cloud, MD   25 mg at 11/24/20 2141  . senna (SENOKOT) tablet 17.2 mg  2 tablet Oral Daily Dellia Cloud, MD   17.2 mg at 11/24/20 0900  . sodium chloride flush (NS) 0.9 % injection 10-40 mL  10-40 mL Intracatheter Q12H Leslye Peer, MD   10 mL at 11/24/20 2142  . sodium chloride flush (NS) 0.9 % injection 10-40 mL  10-40 mL Intracatheter PRN Leslye Peer, MD      . sodium chloride flush (NS) 0.9 % injection 3 mL  3 mL Intravenous Q12H Jonah Blue, MD   3 mL at 11/24/20 2142  . thiamine tablet 100 mg  100 mg Oral Daily Jonah Blue, MD   100 mg at 11/24/20 0900    Physical Exam: Vitals:   11/25/20 0339 11/25/20 0400  BP: (!) 143/88 128/66  Pulse: (!) 131 (!) 124  Resp: (!) 21 (!) 22  Temp: 99.5 F (37.5 C)   SpO2: 96% 97%   Total I/O In: -  Out: 1500 [Urine:1500]  Intake/Output Summary (Last 24 hours) at 11/25/2020 0636 Last data filed at 11/24/2020 2300 Gross per 24 hour  Intake 120 ml  Output 2400 ml  Net -2280 ml   General adult female in bed   HEENT normocephalic atraumatic extraocular movements intact sclera anicteric Neck supple trachea midline Lungs reduced breath sounds; on 4 liters oxygen HeartS1S2 no rub Abdomen soft nontender nondistended Extremities no pitting edema  Psych anxious mood and affect Neuro awake and conversant    Test Results I personally  reviewed new and old clinical labs and radiology tests Lab Results  Component Value Date   NA 134 (L) 11/24/2020   K 5.1 11/24/2020   CL 106 11/24/2020   CO2 19 (L) 11/24/2020   BUN 82 (H) 11/24/2020   CREATININE 2.75 (H) 11/24/2020   CALCIUM 7.3 (L) 11/24/2020   ALBUMIN 1.4 (L) 11/23/2020   PHOS 7.4 (H) 11/24/2020    Lawson Fiscal C  Malen Gauze, MD 11/25/2020 6:47 AM

## 2020-11-25 NOTE — Progress Notes (Signed)
Refused pure wick  at this time , wants to use BSC.

## 2020-11-25 NOTE — Progress Notes (Signed)
Patient ID: Anne Bautista, female   DOB: 01/02/1986, 35 y.o.   MRN: [id number]   LOS: 9 days   Subjective: Pt states foot feels about the same, still unable to bear weight. Says the forefoot is locale of worst pain.   Objective: Vital signs in last 24 hours: Temp:  [98.1 F (36.7 C)-100.5 F (38.1 C)] 98.1 F (36.7 C) (02/22 1203) Pulse Rate:  [104-131] 106 (02/22 1203) Resp:  [17-24] 21 (02/22 1203) BP: (127-150)/(66-96) 144/96 (02/22 1203) SpO2:  [93 %-100 %] 100 % (02/22 1415) Last BM Date: 11/24/20   Laboratory  CBC Recent Labs    11/24/20 0355 11/25/20 0534  WBC 10.4 9.7  HGB 7.4* 6.7*  HCT 22.9* 20.4*  PLT 205 205   BMET Recent Labs    11/24/20 0355 11/25/20 0534  NA 134* 132*  K 5.1 4.6  CL 106 103  CO2 19* 19*  GLUCOSE 99 125*  BUN 82* 68*  CREATININE 2.75* 2.79*  CALCIUM 7.3* 7.8*     Physical Exam General appearance: alert and no distress  Left foot/ankle: Mild pain with AROM/PROM ankle through full arc of motion. Mod TTP dorsal forefoot.   Assessment/Plan: Left foot pain -- Clinically does not seem like septic ankle joint. Will await MRI results.    Freeman Caldron, PA-C Orthopedic Surgery [phone] 11/25/2020

## 2020-11-25 NOTE — Progress Notes (Signed)
Ambulated to the bathroom with front wheel walker with difficulty standing from sitting. desats to 80's on room air. Placed on o2 3l [place]  pulse ox-94 % . Assisted back to bed , continue to monitor.

## 2020-11-25 NOTE — Progress Notes (Signed)
Physical Therapy Treatment Patient Details Name: Anne Bautista MRN: [id number] DOB: 09/24/1986 Today's Date: 11/25/2020    History of Present Illness 35 yo admitted 2/14 with sepsis due to MRSA bacteremia in setting of IVDU. Rt shoulder pain s/p aspiration 2/14. PMHx: IVDU, malnutrition, poor dentition    PT Comments    Pt admitted with above diagnosis. Pt was able get to chair with assist of 2 persons - min to mod assist overall. Pt limited by lethargy and somewhat moaning during session.  Difficult to motivate but pt reluctantly agreed to get to chair and do a few exercises.  Will follow acutely.  Pt currently with functional limitations due to balance and endurance deficits. Pt will benefit from skilled PT to increase their independence and safety with mobility to allow discharge to the venue listed below.     Follow Up Recommendations  SNF;Supervision for mobility/OOB     Equipment Recommendations  Wheelchair (measurements PT);3in1 (PT)    Recommendations for Other Services OT consult     Precautions / Restrictions Precautions Precautions: Fall Precaution Comments: right shoulder, left ankle, back pain, watch sats/RR, bil UE edema Restrictions Weight Bearing Restrictions: No    Mobility  Bed Mobility Overal bed mobility: Needs Assistance Bed Mobility: Supine to Sit Rolling: Min guard   Supine to sit: Mod assist;+2 for physical assistance;+2 for safety/equipment;HOB elevated Sit to supine: Mod assist   General bed mobility comments: increased effort/ time to mobilize EOB, mod A overall with multimodal cues for participation and sequencing. Needing most assist to scoot hips to EOB with used of bed pad and trunk elevation    Transfers Overall transfer level: Needs assistance Equipment used: Rolling walker (2 wheeled) Transfers: Sit to/from [employer] Sit to Stand: Mod assist;+2 physical assistance;+2 safety/equipment;From elevated surface Stand pivot  transfers: Min assist;+2 physical assistance;+2 safety/equipment       General transfer comment: Mod A +2 to initially stand from elevated bed, min A +2 with strong multmodal cues to pivot to recliner  Ambulation/Gait             General Gait Details: unable   Stairs             Wheelchair Mobility    Modified Rankin (Stroke Patients Only)       Balance Overall balance assessment: Needs assistance Sitting-balance support: Bilateral upper extremity supported;Feet supported Sitting balance-Leahy Scale: Fair Sitting balance - Comments: minguard for static sitting balance   Standing balance support: Bilateral upper extremity supported Standing balance-Leahy Scale: Poor Standing balance comment: reliant on trunk and UE support                            Cognition Arousal/Alertness: Lethargic Behavior During Therapy: Flat affect (whimpering throughout session, minimally conversive with therapy team) Overall Cognitive Status: Impaired/Different from baseline Area of Impairment: Attention;Following commands;Safety/judgement;Awareness;Problem solving                 Orientation Level: Disoriented to;Situation Current Attention Level: Sustained Memory: Decreased short-term memory Following Commands: Follows one step commands with increased time Safety/Judgement: Decreased awareness of safety;Decreased awareness of deficits Awareness: Intellectual Problem Solving: Slow processing;Decreased initiation;Difficulty sequencing;Requires verbal cues;Requires tactile cues General Comments: multimodal cues used throughout session to initiate and encourage participation on behalf of the patient, Pt whimpering but did not interact much verbally with therapy team despite pleasant attempts "It hurts everywhere" was the clearest sentence that she said. Pt self-limiting throughout session and  very lethargic      Exercises General Exercises - Lower Extremity Long  Arc Quad: Both;Seated;10 reps;AROM Hip Flexion/Marching: Both;Seated;10 reps;AROM    General Comments General comments (skin integrity, edema, etc.): on 4LO2, HR to 110 bpm      Pertinent Vitals/Pain Pain Assessment: Faces Faces Pain Scale: Hurts even more Pain Location: generalized "it hurts everywhere" Pain Descriptors / Indicators: Aching;Guarding;Moaning;Crying;Discomfort;Constant Pain Intervention(s): Limited activity within patient's tolerance;Monitored during session;Repositioned    Home Living                      Prior Function            PT Goals (current goals can now be found in the care plan section) Acute Rehab PT Goals Patient Stated Goal: none stated today Progress towards PT goals: Progressing toward goals    Frequency    Min 3X/week      PT Plan Current plan remains appropriate    Co-evaluation PT/OT/SLP Co-Evaluation/Treatment: Yes Reason for Co-Treatment: Complexity of the patient's impairments (multi-system involvement);For patient/therapist safety PT goals addressed during session: Mobility/safety with mobility        AM-PAC PT "6 Clicks" Mobility   Outcome Measure  Help needed turning from your back to your side while in a flat bed without using bedrails?: A Little Help needed moving from lying on your back to sitting on the side of a flat bed without using bedrails?: A Little Help needed moving to and from a bed to a chair (including a wheelchair)?: A Lot Help needed standing up from a chair using your arms (e.g., wheelchair or bedside chair)?: A Lot Help needed to walk in hospital room?: Total Help needed climbing 3-5 steps with a railing? : Total 6 Click Score: 12    End of Session Equipment Utilized During Treatment: Gait belt;Oxygen Activity Tolerance: Patient limited by pain Patient left: with call bell/phone within reach;in chair;with chair alarm set Nurse Communication: Mobility status;Precautions PT Visit Diagnosis:  Other abnormalities of gait and mobility (R26.89);Difficulty in walking, not elsewhere classified (R26.2);Pain;Muscle weakness (generalized) (M62.81) Pain - Right/Left: Left Pain - part of body: Ankle and joints of foot     Time: 7654-6503 PT Time Calculation (min) (ACUTE ONLY): 23 min  Charges:  $Therapeutic Activity: 8-22 mins                    Malic Rosten W,PT Acute Rehabilitation Services Pager:  [phone]  Office:  [phone]     Berline Lopes 11/25/2020, 5:51 PM

## 2020-11-25 NOTE — Progress Notes (Signed)
PROGRESS NOTE                                                                                                                                                                                                             Patient Demographics:    Anne Bautista, is a 35 y.o. female, DOB - Feb 23, 1986, ZOX:[id number]  Outpatient Primary MD for the patient is Patient, No Pcp Per    LOS - 9  Admit date - 11/15/2020    Chief Complaint  Patient presents with  . 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  She was admitted by critical care, she was seen by ID, cardiothoracic surgery, orthopedics.  She was transferred to hospitalist service on 11/25/2020 on day 9 of hospital stay.   Subjective:    Anne Bautista today has, No headache, does have some left ankle pain, some diffuse chest and abdominal pain, no shortness of breath.   Assessment  & Plan :   1.  Sepsis with MRSA bacteremia due to tricuspid valve endocarditis with septic emboli to both lungs, kidneys, right shoulder, possibly left ankle -she is currently being seen by ID and is on daptomycin IV, she has been seen by cardiothoracic surgery, PCCM and orthopedics.  Has undergone right shoulder aspiration showing staph aureus as well.  Has been extensively counseled to quit IV drug use, she is due for MRI of the left ankle on 11/25/2020.  For now continue supportive care with IV daptomycin, pain control and ID follow-up and guidance  2.  Acute  hypoxic respiratory failure due to combination of narcotic withdrawal, bilateral septic pulmonary infarcts.  Supportive care with IV antibiotics, supplemental oxygen and nebulizer treatments.  Overall this problem seems to be improving.  Seen by pulmonary critical care.  3.  IV drug use, tobacco use, cocaine use, marijuana use. She is on methadone along with as needed Dilaudid, nicotine  patch and Seroquel, has been on this regimen throughout the hospital stay.  Monitor.  4.  GERD.  On PPI.  5.  Anemia.  Anemia panel suggestive of mild iron deficiency, also worse due to sepsis and multiple lab draws, 1 unit of packed RBC transfusion on 11/25/2020, on oral PPI, no signs of active bleeding.  Monitor.  6.  Chronic constipation.  On bowel regimen.   7.  Ongoing left foot pain.  Suspicious for septic arthritis/embolization.  Orthopedics on board.  MRI left foot and ankle pending to be done on 11/25/2018 to be  8.  Nonoliguric AKI with renal infarcts due to septic emboli.  Nephrology following defer further management to nephrology.  9.  Moderate to severe protein calorie malnutrition with third spacing of fluid/anasarca.  On protein supplementation.  Nephrology on board.      Condition - Extremely Guarded  Family Communication  : Patient will update her friend herself.  No family listed for contact.  Code Status :  Full  Consults  :  PCCM, ID, Renal, Ortho, cardiothoracic surgery  PUD Prophylaxis :  PPI   Procedures  :     TTE - 1. Left ventricular ejection fraction, by estimation, is 55 to 60%. The left ventricle has normal function. The left ventricle has no regional wall motion abnormalities. Left ventricular diastolic parameters are consistent with Grade I diastolic dysfunction (impaired relaxation).  2. Right ventricular systolic function is normal. The right ventricular size is normal. There is normal pulmonary artery systolic pressure. The estimated right ventricular systolic pressure is  22.4 mmHg.  3. The mitral valve is normal in structure. No evidence of mitral valve regurgitation. No evidence of mitral stenosis.  4. 2 x 1 cm mobile vegetation attached to the tricuspid valve.  5. The aortic valve is tricuspid. Aortic valve regurgitation is not visualized. No aortic stenosis is present.  6. The inferior vena cava is normal in size with greater than 50% respiratory variability, suggesting right atrial pressure of 3 mmHg  CT angiogram chest.  CT abdomen pelvis.  Bilateral pulmonary cavitary lesion suggestive of infectious embolization, bilateral renal infarcts suggestive of infectious embolization     CT Head w/o Contrast 2/12 >> brain appears normal; right maxillary sinus, possible rhinosinusitis    LE Dopplers 2/14 >> negative for DVT bilaterally    R Shoulder Joint Aspiration >>  Rare Staph aureus, replating       Disposition Plan  :    Status is: Inpatient  Remains inpatient appropriate because:IV treatments appropriate due to intensity of illness or inability to take PO   Dispo: The patient is from: Home              Anticipated d/c is to: Home              Anticipated d/c date is: 3 days              Patient currently is not medically stable to d/c.   Difficult to place patient No  DVT Prophylaxis  :    Heparin   Lab Results  Component Value Date   PLT 205 11/25/2020    Diet :  Diet Order            Diet full liquid Room service appropriate? Yes; Fluid consistency: Thin  Diet effective now                  Inpatient Medications  Scheduled Meds: . sodium chloride  Intravenous Once  . acetylcysteine  4 mL Nebulization Q6H  . chlorhexidine  15 mL Mouth Rinse BID  . Chlorhexidine Gluconate Cloth  6 each Topical Daily  . docusate sodium  100 mg Oral BID  . feeding supplement  237 mL Oral TID BM  . folic acid  1 mg Oral Daily  . heparin  5,000 Units Subcutaneous Q8H  . mouth rinse  15 mL Mouth Rinse q12n4p  . methadone  10 mg Oral Q8H   . multivitamin with minerals  1 tablet Oral Daily  . pantoprazole  40 mg Oral Daily  . polyethylene glycol  17 g Oral BID  . QUEtiapine  25 mg Oral BID  . senna  2 tablet Oral Daily  . sodium chloride flush  10-40 mL Intracatheter Q12H  . sodium chloride flush  3 mL Intravenous Q12H  . thiamine  100 mg Oral Daily   Continuous Infusions: . sodium chloride Stopped (11/23/20 2116)  . DAPTOmycin (CUBICIN)  IV 827 mg (11/24/20 2140)   PRN Meds:.sodium chloride, acetaminophen **OR** acetaminophen, bisacodyl, HYDROmorphone (DILAUDID) injection, levalbuterol, LORazepam, metoprolol tartrate, nicotine, oxyCODONE  Antibiotics  :    Anti-infectives (From admission, onward)   Start     Dose/Rate Route Frequency Ordered Stop   11/24/20 2000  DAPTOmycin (CUBICIN) 827 mg in sodium chloride 0.9 % IVPB        10 mg/kg  82.7 kg 233.1 mL/hr over 30 Minutes Intravenous Daily 11/24/20 0821     11/19/20 1000  DAPTOmycin (CUBICIN) 770 mg in sodium chloride 0.9 % IVPB  Status:  Discontinued        10 mg/kg  77 kg 230.8 mL/hr over 30 Minutes Intravenous Daily 11/19/20 0908 11/24/20 0821   11/19/20 1000  doxycycline (VIBRA-TABS) tablet 100 mg        100 mg Oral Every 12 hours 11/19/20 0908 11/23/20 2123   11/19/20 0439  vancomycin variable dose per unstable renal function (pharmacist dosing)  Status:  Discontinued         Does not apply See admin instructions 11/19/20 0439 11/19/20 0853   11/16/20 2000  vancomycin (VANCOREADY) IVPB 1250 mg/250 mL  Status:  Discontinued        1,250 mg 166.7 mL/hr over 90 Minutes Intravenous Every 24 hours 11/15/20 1933 11/16/20 1108   11/16/20 1200  vancomycin (VANCOCIN) IVPB 1000 mg/200 mL premix  Status:  Discontinued        1,000 mg 200 mL/hr over 60 Minutes Intravenous Every 12 hours 11/16/20 1108 11/19/20 0439   11/16/20 0745  metroNIDAZOLE (FLAGYL) IVPB 500 mg        500 mg 100 mL/hr over 60 Minutes Intravenous  Once 11/16/20 0741 11/16/20 0907   11/15/20 2237   vancomycin (VANCOCIN) 500 MG powder       Note to Pharmacy: Ihor Dow   : cabinet override      11/15/20 2237 11/16/20 1044   11/15/20 2000  ceFEPIme (MAXIPIME) 2 g in sodium chloride 0.9 % 100 mL IVPB  Status:  Discontinued        2 g 200 mL/hr over 30 Minutes Intravenous Every 12 hours 11/15/20 1933 11/16/20 1212   11/15/20 1945  vancomycin (VANCOREADY) IVPB 1500 mg/300 mL  Status:  Discontinued        1,500 mg 150 mL/hr over 120 Minutes Intravenous  Once 11/15/20 1933 11/15/20 1939   11/15/20 1945  vancomycin (VANCOCIN) IVPB 1000 mg/200 mL premix       "And" Linked  Group Details   1,000 mg 200 mL/hr over 60 Minutes Intravenous  Once 11/15/20 1939 11/15/20 2231   11/15/20 1945  vancomycin (VANCOREADY) IVPB 500 mg/100 mL       "And" Linked Group Details   500 mg 100 mL/hr over 60 Minutes Intravenous  Once 11/15/20 1939 11/16/20 0002       Time Spent in minutes  30   Susa Raring M.D on 11/25/2020 at 11:42 AM  To page go to [email or web address]   [facility] -  Office  [phone]     See all Orders from today for further details    Objective:   Vitals:   11/25/20 0728 11/25/20 0945 11/25/20 0949 11/25/20 1004  BP:  131/82 131/82 127/82  Pulse:   (!) 105 (!) 104  Resp:   (!) 24 (!) 22  Temp:  98.4 F (36.9 C)  98.1 F (36.7 C)  TempSrc:  Oral  Oral  SpO2: 97%   98%  Weight:      Height:        Wt Readings from Last 3 Encounters:  11/24/20 82.7 kg     Intake/Output Summary (Last 24 hours) at 11/25/2020 1142 Last data filed at 11/25/2020 1004 Gross per 24 hour  Intake 315 ml  Output 3200 ml  Net -2885 ml     Physical Exam  Awake Alert, No new F.N deficits,   [place].AT,PERRAL Supple Neck,No JVD, No cervical lymphadenopathy appriciated.  Symmetrical Chest wall movement, Good air movement bilaterally, CTAB RRR,No Gallops,Rubs or new Murmurs, No Parasternal Heave +ve B.Sounds, Abd Soft, No tenderness, No organomegaly appriciated, No rebound - guarding  or rigidity. No Cyanosis, L ankle red and tender minimal effusion    Data Review:    CBC Recent Labs  Lab 11/20/20 0918 11/21/20 0607 11/22/20 1045 11/23/20 0217 11/24/20 0355 11/25/20 0534  WBC 10.0 9.6  --  11.8* 10.4 9.7  HGB 8.2* 7.8* 8.2* 8.2* 7.4* 6.7*  HCT 23.0* 23.3* 24.0* 24.9* 22.9* 20.4*  PLT 67* 92*  --  175 205 205  MCV 77.7* 79.3*  --  81.6 82.4 82.3  MCH 27.7 26.5  --  26.9 26.6 27.0  MCHC 35.7 33.5  --  32.9 32.3 32.8  RDW 16.2* 16.1*  --  16.5* 16.4* 16.2*    Recent Labs  Lab 11/21/20 0526 11/21/20 1500 11/22/20 1045 11/22/20 1400 11/23/20 0217 11/24/20 0355 11/25/20 0534 11/25/20 0817  NA 131* 131* 135 135 133* 134* 132*  --   K 5.2* 5.7* 5.5* 5.6* 5.0 5.1 4.6  --   CL 102 103  --  106 105 106 103  --   CO2 17* 18*  --  19* 17* 19* 19*  --   GLUCOSE 103* 100*  --  80 95 99 125*  --   BUN 90* 89*  --  93* 90* 82* 68*  --   CREATININE 2.69* 2.63*  --  2.76* 2.73* 2.75* 2.79*  --   CALCIUM 6.7* 6.8*  --  6.8* 6.9* 7.3* 7.8*  --   AST 18  --   --   --   --   --   --   --   ALT 11  --   --   --   --   --   --   --   ALKPHOS 89  --   --   --   --   --   --   --   BILITOT 1.2  --   --   --   --   --   --   --  ALBUMIN 1.1*  --   --  1.5* 1.4*  --   --   --   MG  --   --   --   --  2.7* 2.6* 2.3  --   CRP  --   --   --   --   --   --  19.7*  --   PROCALCITON  --   --   --   --   --   --   --  0.86  LATICACIDVEN 0.8  --   --   --   --   --   --   --     ------------------------------------------------------------------------------------------------------------------ No results for input(s): CHOL, HDL, LDLCALC, TRIG, CHOLHDL, LDLDIRECT in the last 72 hours.  No results found for: HGBA1C ------------------------------------------------------------------------------------------------------------------ No results for input(s): TSH, T4TOTAL, T3FREE, THYROIDAB in the last 72 hours.  Invalid input(s): FREET3  Cardiac Enzymes No results for input(s):  CKMB, TROPONINI, MYOGLOBIN in the last 168 hours.  Invalid input(s): CK ------------------------------------------------------------------------------------------------------------------ No results found for: BNP     Radiology Reports DG Tibia/Fibula Left  Result Date: 11/20/2020 CLINICAL DATA:  Left lower extremity cellulitis. EXAM: LEFT TIBIA AND FIBULA - 2 VIEW COMPARISON:  None. FINDINGS: There is no evidence of fracture or other focal bone lesions. No soft tissue gas is seen. There is some stranding in subcutaneous fat. IMPRESSION: Stranding in subcutaneous fat compatible with cellulitis. The exam is otherwise negative. Electronically Signed   By: Drusilla Kanner M.D.   On: 11/20/2020 11:51   CT Head Wo Contrast  Result Date: 11/15/2020 CLINICAL DATA:  Classic migraine headache. EXAM: CT HEAD WITHOUT CONTRAST TECHNIQUE: Contiguous axial images were obtained from the base of the skull through the vertex without intravenous contrast. COMPARISON:  07/08/2015 FINDINGS: Brain: The brain shows a normal appearance without evidence of malformation, atrophy, old or acute small or large vessel infarction, mass lesion, hemorrhage, hydrocephalus or extra-axial collection. Vascular: No hyperdense vessel. No evidence of atherosclerotic calcification. Skull: Normal.  No traumatic finding.  No focal bone lesion. Sinuses/Orbits: Air-fluid level in the right maxillary sinus suggesting rhinosinusitis. Other visualized sinuses are clear. Orbits negative. Other: None significant IMPRESSION: 1. Normal appearance of the brain itself. 2. Air-fluid level in the right maxillary sinus suggesting rhinosinusitis. Electronically Signed   By: Paulina Fusi M.D.   On: 11/15/2020 20:45   CT Angio Chest PE W and/or Wo Contrast  Result Date: 11/15/2020 CLINICAL DATA:  Body aches, weakness, cough for 1 week EXAM: CT ANGIOGRAPHY CHEST WITH CONTRAST TECHNIQUE: Multidetector CT imaging of the chest was performed using the standard  protocol during bolus administration of intravenous contrast. Multiplanar CT image reconstructions and MIPs were obtained to evaluate the vascular anatomy. CONTRAST:  OMNIPAQUE IOHEXOL 350 MG/ML SOLN COMPARISON:  04/16/2016 FINDINGS: Cardiovascular: Satisfactory opacification of the pulmonary arteries to the segmental level. No evidence of pulmonary embolism. Normal heart size. No pericardial effusion. Mediastinum/Nodes: No enlarged mediastinal, hilar, or axillary lymph nodes. Thyroid gland, trachea, and esophagus demonstrate no significant findings. Lungs/Pleura: There are numerous bilateral cavitary nodules throughout the lungs. Interlobular septal thickening. Small left, trace right pleural effusions and associated atelectasis or consolidation. Upper Abdomen: Please see separately reported examination of the abdomen. Musculoskeletal: No chest wall abnormality. No acute or significant osseous findings. Review of the MIP images confirms the above findings. IMPRESSION: 1. Negative examination for pulmonary embolism. 2. There are numerous bilateral cavitary nodules throughout the lungs. These are most consistent with septic emboli; vasculitis and  metastatic disease general differential considerations. 3. Small left, trace right pleural effusions and associated atelectasis or consolidation. Electronically Signed   By: Lauralyn Primes M.D.   On: 11/15/2020 20:42   CT ABDOMEN PELVIS W CONTRAST  Result Date: 11/15/2020 CLINICAL DATA:  Abdominal distension, left flank pain EXAM: CT ABDOMEN AND PELVIS WITH CONTRAST TECHNIQUE: Multidetector CT imaging of the abdomen and pelvis was performed using the standard protocol following bolus administration of intravenous contrast. CONTRAST:  OMNIPAQUE IOHEXOL 350 MG/ML SOLN COMPARISON:  03/31/2016 FINDINGS: Lower chest: Please see separately reported examination of the chest. Hepatobiliary: No solid liver abnormality is seen. No gallstones, gallbladder wall thickening,  or biliary dilatation. Pancreas: Unremarkable. No pancreatic ductal dilatation or surrounding inflammatory changes. Spleen: Splenomegaly, maximum coronal span 17.4 cm. Adrenals/Urinary Tract: Adrenal glands are unremarkable. There are multiple subtly hypoenhancing lesions of the renal cortices. No hydronephrosis. Bladder is unremarkable. Stomach/Bowel: Stomach is within normal limits. Appendix appears normal. No evidence of bowel wall thickening, distention, or inflammatory changes. Vascular/Lymphatic: No significant vascular findings are present. No enlarged abdominal or pelvic lymph nodes. Reproductive: No mass or other significant abnormality. Other: No abdominal wall hernia or abnormality. No abdominopelvic ascites. Musculoskeletal: No acute or significant osseous findings. IMPRESSION: 1. There are multiple subtly hypoenhancing lesions of the renal cortices. Findings are most in keeping with septic emboli. 2. Splenomegaly, maximum coronal span 17.4 cm. Electronically Signed   By: Lauralyn Primes M.D.   On: 11/15/2020 20:45   DG Chest Port 1 View  Result Date: 11/25/2020 CLINICAL DATA:  MRSA EXAM: PORTABLE CHEST 1 VIEW COMPARISON:  November 15, 2020, November 24, 2020 FINDINGS: The cardiomediastinal silhouette is unchanged in contour. No significant pleural effusion. No pneumothorax. Revisualization of bilateral peripheral predominant consolidative opacities, unchanged since most recent prior but increased since November 15, 2020. Several of these are cavitary. Visualized abdomen is unremarkable. No acute osseous abnormality. IMPRESSION: No significant interval change in bilateral peripheral predominant consolidative opacities. Electronically Signed   By: Meda Klinefelter MD   On: 11/25/2020 07:57   DG Chest Port 1 View  Result Date: 11/24/2020 CLINICAL DATA:  Acute on chronic respiratory failure EXAM: PORTABLE CHEST 1 VIEW COMPARISON:  Yesterday FINDINGS: Patchy bilateral pneumonia. Small pleural  effusions. Stable heart size which is normal for technique. No visible pneumothorax. IMPRESSION: Stable multifocal and cavitary pneumonia. Electronically Signed   By: Marnee Spring M.D.   On: 11/24/2020 04:55   DG Chest Port 1 View  Result Date: 11/23/2020 CLINICAL DATA:  Pneumonia. EXAM: PORTABLE CHEST 1 VIEW COMPARISON:  11/20/2020 FINDINGS: Cardiomediastinal silhouette is unchanged. Diffuse bilateral nodular opacities within both lungs are not significantly changed. Slightly increased bilateral LOWER lung consolidation/atelectasis noted. Trace bilateral pleural effusions are noted. There is no evidence of pneumothorax or acute bony abnormality. IMPRESSION: 1. Slightly increased bilateral LOWER lung consolidation/atelectasis and trace bilateral pleural effusions. 2. Unchanged diffuse bilateral nodular opacities suspicious for septic emboli. Electronically Signed   By: Harmon Pier M.D.   On: 11/23/2020 08:23   DG Chest Port 1 View  Result Date: 11/20/2020 CLINICAL DATA:  Tachypnea. EXAM: PORTABLE CHEST 1 VIEW COMPARISON:  November 17, 2020. FINDINGS: The heart size and mediastinal contours are within normal limits. No pneumothorax is noted. Stable bilateral rounded nodular densities are noted throughout both lungs concerning for septic emboli or possibly metastatic disease. Increased left basilar opacity is noted concerning for worsening pneumonia with possible associated pleural effusion. The visualized skeletal structures are unremarkable. IMPRESSION: Stable bilateral rounded nodular densities  are noted throughout both lungs concerning for septic emboli or possibly metastatic disease. Increased left basilar opacity is noted concerning for worsening pneumonia with possible associated pleural effusion. Electronically Signed   By: Lupita Raider M.D.   On: 11/20/2020 11:52   DG CHEST PORT 1 VIEW  Result Date: 11/17/2020 CLINICAL DATA:  Shortness of breath. EXAM: PORTABLE CHEST 1 VIEW COMPARISON:   Chest radiograph and CT chest 11/15/2020. FINDINGS: Trachea is midline. Heart size stable. Nodular peripheral airspace consolidation appears increased in the interval. Probable trace bilateral pleural effusions. IMPRESSION: 1. Worsening peripheral nodular airspace consolidation, suspicious for septic emboli. 2. Trace bilateral pleural effusions. Electronically Signed   By: Leanna Battles M.D.   On: 11/17/2020 09:20   DG Chest Portable 1 View  Result Date: 11/15/2020 CLINICAL DATA:  Weakness. Generalized body aches and cough for 1 week. Nausea. EXAM: PORTABLE CHEST 1 VIEW COMPARISON:  04/09/2016 FINDINGS: Cardiac silhouette is normal in size. Normal mediastinal and hilar contours. There are focal opacities in both lungs that are similar to the prior chest radiograph. Current opacities are likely due to residual scarring from the previous septic emboli. Areas of acute infection, however, are not excluded. Mild reticular opacity at the left lung base is noted consist subsegmental atelectasis or scarring. There are prominent bronchovascular markings, stable. No evidence of pulmonary edema. No convincing pleural effusion or pneumothorax. Skeletal structures are grossly intact. IMPRESSION: 1. Bilateral lung opacities that likely residual scarring from previous septic emboli as noted on the exams from 04/03/2016. Acute areas infection/inflammation excluded, but felt less likely. No evidence of pulmonary edema. Electronically Signed   By: Amie Portland M.D.   On: 11/15/2020 15:53   DG Swallowing Func-Speech Pathology  Result Date: 11/23/2020 Objective Swallowing Evaluation: Type of Study: MBS-Modified Barium Swallow Study  Patient Details Name: Anne Bautista MRN: [id number] Date of Birth: December 03, 1985 Today's Date: 11/23/2020 Time: SLP Start Time (ACUTE ONLY): 1142 -SLP Stop Time (ACUTE ONLY): 1203 SLP Time Calculation (min) (ACUTE ONLY): 21 min Past Medical History: Past Medical History: Diagnosis Date . IV drug user  .  Malnutrition (HCC) 11/16/2020 . Poor dentition 11/16/2020 . Tobacco dependence 11/16/2020 Past Surgical History: No past surgical history on file. HPI: Pt is a 35 y.o. admitted 2/14 with sepsis due to MRSA bacteremia in setting of IVDU. Rt shoulder pain s/p aspiration 2/14. PMHx: IVDU, malnutrition, poor dentition. Toxicology positive for cocaine. CXR 2/17: Stable bilateral rounded nodular densities are noted throughout both  lungs concerning for septic emboli or possibly metastatic disease.  Increased left basilar opacity is noted concerning for worsening  pneumonia with possible associated pleural effusion.  CT head Air-fluid level in the right maxillary sinus suggesting  rhinosinusitis. CXR 2/10: Slightly increased bilateral LOWER lung consolidation/atelectasis  and trace bilateral pleural effusions.  Subjective: pt asleep in bed, awoke eenough to verbalize desire for solids po Assessment / Plan / Recommendation CHL IP CLINICAL IMPRESSIONS 11/23/2020 Clinical Impression Pt presents with oral phase dysphagia which is likely at least partially related to her mentation. She required cueing to open her eyes during the study and participate fully. She exhibited moaning, but verbal output was limited. She demonstrated moderate to significantly prolonged bolus manipulation and mastication of regular textures, and impaired posterior bolus propulsion. Pt inconsistently used a posterior head tilt to assist with A-P transport of solids, and mild lingual residue was noted. Coughing was intermittently observed throughout the study as was demonstrated during the treatment session earlier. However, no instances of penetration or  aspiration were noted during the study even when she was challenged with intake of large boluses and with use of consecutive swallows. Pt's diet may be progressed to full liquids with potential for further advancement as her mentation improves. SLP will continue to follow pt. SLP Visit Diagnosis Dysphagia,  unspecified (R13.10) Attention and concentration deficit following -- Frontal lobe and executive function deficit following -- Impact on safety and function Mild aspiration risk   CHL IP TREATMENT RECOMMENDATION 11/23/2020 Treatment Recommendations Therapy as outlined in treatment plan below   Prognosis 11/23/2020 Prognosis for Safe Diet Advancement Good Barriers to Reach Goals -- Barriers/Prognosis Comment -- CHL IP DIET RECOMMENDATION 11/23/2020 SLP Diet Recommendations Thin liquid Liquid Administration via Cup;Straw Medication Administration Whole meds with puree Compensations Slow rate;Small sips/bites Postural Changes Seated upright at 90 degrees   CHL IP OTHER RECOMMENDATIONS 11/23/2020 Recommended Consults -- Oral Care Recommendations Oral care BID Other Recommendations --   CHL IP FOLLOW UP RECOMMENDATIONS 11/23/2020 Follow up Recommendations None   CHL IP FREQUENCY AND DURATION 11/23/2020 Speech Therapy Frequency (ACUTE ONLY) min 2x/week Treatment Duration 2 weeks      [facility] IP ORAL PHASE 11/23/2020 Oral Phase Impaired Oral - Pudding Teaspoon -- Oral - Pudding Cup -- Oral - Honey Teaspoon -- Oral - Honey Cup -- Oral - Nectar Teaspoon -- Oral - Nectar Cup -- Oral - Nectar Straw -- Oral - Thin Teaspoon -- Oral - Thin Cup WFL Oral - Thin Straw WFL Oral - Puree Reduced posterior propulsion Oral - Mech Soft -- Oral - Regular Impaired mastication;Reduced posterior propulsion Oral - Multi-Consistency -- Oral - Pill -- Oral Phase - Comment --  CHL IP PHARYNGEAL PHASE 11/23/2020 Pharyngeal Phase WFL Pharyngeal- Pudding Teaspoon -- Pharyngeal -- Pharyngeal- Pudding Cup -- Pharyngeal -- Pharyngeal- Honey Teaspoon -- Pharyngeal -- Pharyngeal- Honey Cup -- Pharyngeal -- Pharyngeal- Nectar Teaspoon -- Pharyngeal -- Pharyngeal- Nectar Cup -- Pharyngeal -- Pharyngeal- Nectar Straw -- Pharyngeal -- Pharyngeal- Thin Teaspoon -- Pharyngeal -- Pharyngeal- Thin Cup -- Pharyngeal -- Pharyngeal- Thin Straw -- Pharyngeal -- Pharyngeal-  Puree -- Pharyngeal -- Pharyngeal- Mechanical Soft -- Pharyngeal -- Pharyngeal- Regular -- Pharyngeal -- Pharyngeal- Multi-consistency -- Pharyngeal -- Pharyngeal- Pill -- Pharyngeal -- Pharyngeal Comment --  CHL IP CERVICAL ESOPHAGEAL PHASE 11/23/2020 Cervical Esophageal Phase WFL Pudding Teaspoon -- Pudding Cup -- Honey Teaspoon -- Honey Cup -- Nectar Teaspoon -- Nectar Cup -- Nectar Straw -- Thin Teaspoon -- Thin Cup -- Thin Straw -- Puree -- Mechanical Soft -- Regular -- Multi-consistency -- Pill -- Cervical Esophageal Comment -- Shanika I. Vear Clock, MS, CCC-SLP Acute Rehabilitation Services Office number [phone] Pager [phone] Scheryl Marten 11/23/2020, 1:34 PM              ECHOCARDIOGRAM COMPLETE  Result Date: 11/17/2020    ECHOCARDIOGRAM REPORT   Patient Name:   Anne Bautista Date of Exam: 11/17/2020 Medical Rec #:  [id number]   Height:       67.0 in Accession #:    [id number]  Weight:       172.0 lb Date of Birth:  December 24, 1985    BSA:          1.896 m Patient Age:    34 years    BP:           120/64 mmHg Patient Gender: F           HR:           132 bpm. Exam Location:  Inpatient Procedure: 2D  Echo, Color Doppler and Cardiac Doppler REPORT CONTAINS CRITICAL RESULT Indications:    Endocarditis.; R00.0 Tachycardia  History:        Patient has no prior history of Echocardiogram examinations.                 Signs/Symptoms:Shortness of Breath; Risk Factors:Current Smoker.                 IVDU.  Sonographer:    Sheralyn Boatman RDCS Referring Phys: [id number] JENNIFER YATES  Sonographer Comments: Technically difficult study due to poor echo windows. Patient moaning in pain throughout test IMPRESSIONS  1. Left ventricular ejection fraction, by estimation, is 55 to 60%. The left ventricle has normal function. The left ventricle has no regional wall motion abnormalities. Left ventricular diastolic parameters are consistent with Grade I diastolic dysfunction (impaired relaxation).  2. Right ventricular systolic function  is normal. The right ventricular size is normal. There is normal pulmonary artery systolic pressure. The estimated right ventricular systolic pressure is 22.4 mmHg.  3. The mitral valve is normal in structure. No evidence of mitral valve regurgitation. No evidence of mitral stenosis.  4. 2 x 1 cm mobile vegetation attached to the tricuspid valve.  5. The aortic valve is tricuspid. Aortic valve regurgitation is not visualized. No aortic stenosis is present.  6. The inferior vena cava is normal in size with greater than 50% respiratory variability, suggesting right atrial pressure of 3 mmHg. FINDINGS  Left Ventricle: Left ventricular ejection fraction, by estimation, is 55 to 60%. The left ventricle has normal function. The left ventricle has no regional wall motion abnormalities. The left ventricular internal cavity size was normal in size. There is  no left ventricular hypertrophy. Left ventricular diastolic parameters are consistent with Grade I diastolic dysfunction (impaired relaxation). Right Ventricle: The right ventricular size is normal. No increase in right ventricular wall thickness. Right ventricular systolic function is normal. There is normal pulmonary artery systolic pressure. The tricuspid regurgitant velocity is 2.20 m/s, and  with an assumed right atrial pressure of 3 mmHg, the estimated right ventricular systolic pressure is 22.4 mmHg. Left Atrium: Left atrial size was normal in size. Right Atrium: Right atrial size was normal in size. Pericardium: There is no evidence of pericardial effusion. Mitral Valve: The mitral valve is normal in structure. There is mild calcification of the mitral valve leaflet(s). No evidence of mitral valve regurgitation. No evidence of mitral valve stenosis. Tricuspid Valve: 2 x 1 cm mobile vegetation attached to the tricuspid valve. The tricuspid valve is normal in structure. Tricuspid valve regurgitation is trivial. No evidence of tricuspid stenosis. Aortic Valve: The  aortic valve is tricuspid. Aortic valve regurgitation is not visualized. No aortic stenosis is present. Pulmonic Valve: The pulmonic valve was normal in structure. Pulmonic valve regurgitation is not visualized. Aorta: The aortic root is normal in size and structure. Venous: The inferior vena cava is normal in size with greater than 50% respiratory variability, suggesting right atrial pressure of 3 mmHg. IAS/Shunts: No atrial level shunt detected by color flow Doppler.  LEFT VENTRICLE PLAX 2D LVIDd:         4.70 cm     Diastology LVIDs:         3.20 cm     LV e' medial:    10.10 cm/s LV PW:         1.00 cm     LV E/e' medial:  10.6 LV IVS:        1.10  cm     LV e' lateral:   12.70 cm/s LVOT diam:     1.80 cm     LV E/e' lateral: 8.4 LV SV:         60 LV SV Index:   32 LVOT Area:     2.54 cm  LV Volumes (MOD) LV vol d, MOD A2C: 30.6 ml LV vol d, MOD A4C: 63.2 ml LV vol s, MOD A2C: 13.7 ml LV vol s, MOD A4C: 23.5 ml LV SV MOD A2C:     16.9 ml LV SV MOD A4C:     63.2 ml LV SV MOD BP:      26.3 ml RIGHT VENTRICLE             IVC RV S prime:     15.20 cm/s  IVC diam: 1.70 cm TAPSE (M-mode): 1.8 cm LEFT ATRIUM             Index      RIGHT ATRIUM          Index LA diam:        3.00 cm 1.58 cm/m RA Area:     9.90 cm LA Vol (A2C):   11.5 ml 6.06 ml/m RA Volume:   19.70 ml 10.39 ml/m LA Vol (A4C):   17.1 ml 9.02 ml/m LA Biplane Vol: 15.2 ml 8.01 ml/m  AORTIC VALVE LVOT Vmax:   178.00 cm/s LVOT Vmean:  123.000 cm/s LVOT VTI:    0.236 m  AORTA Ao Root diam: 2.90 cm Ao Asc diam:  2.80 cm MITRAL VALVE                TRICUSPID VALVE MV Area (PHT): 4.31 cm     TR Peak grad:   19.4 mmHg MV Decel Time: 176 msec     TR Vmax:        220.00 cm/s MV E velocity: 107.00 cm/s MV A velocity: 107.00 cm/s  SHUNTS MV E/A ratio:  1.00         Systemic VTI:  0.24 m                             Systemic Diam: 1.80 cm Marca Ancona MD Electronically signed by Marca Ancona MD Signature Date/Time: 11/17/2020/3:36:26 PM    Final    VAS [place]  LOWER EXTREMITY VENOUS (DVT)  Result Date: 11/17/2020  Lower Venous DVT Study Indications: Swelling, LT>RT.  Comparison Study: No prior studies. Performing Technologist: Jean Rosenthal RDMS  Examination Guidelines: A complete evaluation includes B-mode imaging, spectral Doppler, color Doppler, and power Doppler as needed of all accessible portions of each vessel. Bilateral testing is considered an integral part of a complete examination. Limited examinations for reoccurring indications may be performed as noted. The reflux portion of the exam is performed with the patient in reverse Trendelenburg.  +---------+---------------+---------+-----------+----------+--------------+ RIGHT    CompressibilityPhasicitySpontaneityPropertiesThrombus Aging +---------+---------------+---------+-----------+----------+--------------+ CFV      Full           Yes      Yes                                 +---------+---------------+---------+-----------+----------+--------------+ SFJ      Full                                                        +---------+---------------+---------+-----------+----------+--------------+  FV Prox  Full                                                        +---------+---------------+---------+-----------+----------+--------------+ FV Mid   Full                                                        +---------+---------------+---------+-----------+----------+--------------+ FV DistalFull                                                        +---------+---------------+---------+-----------+----------+--------------+ PFV      Full                                                        +---------+---------------+---------+-----------+----------+--------------+ POP      Full           Yes      Yes                                 +---------+---------------+---------+-----------+----------+--------------+ PTV      Full                                                         +---------+---------------+---------+-----------+----------+--------------+ PERO     Full                                                        +---------+---------------+---------+-----------+----------+--------------+   +---------+---------------+---------+-----------+----------+--------------+ LEFT     CompressibilityPhasicitySpontaneityPropertiesThrombus Aging +---------+---------------+---------+-----------+----------+--------------+ CFV      Full           Yes      Yes                                 +---------+---------------+---------+-----------+----------+--------------+ SFJ      Full                                                        +---------+---------------+---------+-----------+----------+--------------+ FV Prox  Full                                                        +---------+---------------+---------+-----------+----------+--------------+  FV Mid   Full                                                        +---------+---------------+---------+-----------+----------+--------------+ FV DistalFull                                                        +---------+---------------+---------+-----------+----------+--------------+ PFV      Full                                                        +---------+---------------+---------+-----------+----------+--------------+ POP      Full           Yes      Yes                                 +---------+---------------+---------+-----------+----------+--------------+ PTV      Full                                                        +---------+---------------+---------+-----------+----------+--------------+ PERO     Full                                                        +---------+---------------+---------+-----------+----------+--------------+     Summary: RIGHT: - There is no evidence of deep vein thrombosis in the lower extremity.  - No  cystic structure found in the popliteal fossa.  LEFT: - There is no evidence of deep vein thrombosis in the lower extremity.  - No cystic structure found in the popliteal fossa.  *See table(s) above for measurements and observations. Electronically signed by Fabienne Brunsharles Fields MD on 11/17/2020 at 4:21:49 PM.    Final

## 2020-11-25 NOTE — Progress Notes (Signed)
Occupational Therapy Treatment Patient Details Name: Anne Bautista MRN: [id number] DOB: 01-02-1986 Today's Date: 11/25/2020    History of present illness 35 yo admitted 2/14 with sepsis due to MRSA bacteremia in setting of IVDU. Rt shoulder pain s/p aspiration 2/14. PMHx: IVDU, malnutrition, poor dentition   OT comments  Pt very lethargic throughout session, whimpering/moaning and not conversant with therapy team much except to identify pain - still complaining about L ankle. Pt was mod A +2 to stand from EOB with RW, and min A to pivot to recliner with MAX multimodal cues for sequencing and initiation. Pt able to perform washing hands and washing face with warm wash cloth with max cues but no physical assist. Unsure if Pt self limiting vs medication?!? OT will continued to follow acutely, POC remains appropriate.    Follow Up Recommendations  SNF;Supervision/Assistance - 24 hour    Equipment Recommendations  None recommended by OT    Recommendations for Other Services      Precautions / Restrictions Precautions Precautions: Fall Precaution Comments: right shoulder, left ankle, back pain, watch sats/RR, bil UE edema Restrictions Weight Bearing Restrictions: No       Mobility Bed Mobility Overal bed mobility: Needs Assistance Bed Mobility: Supine to Sit     Supine to sit: Mod assist;+2 for physical assistance;+2 for safety/equipment;HOB elevated     General bed mobility comments: increased effort/ time to mobilize EOB, mod A overall with multimodal cues for participation and sequencing. Needing most assist to scoot hips to EOB with used of bed pad and trunk elevation    Transfers Overall transfer level: Needs assistance Equipment used: Rolling walker (2 wheeled) Transfers: Sit to/from [employer] Sit to Stand: Mod assist;+2 physical assistance;+2 safety/equipment;From elevated surface Stand pivot transfers: Min assist;+2 physical assistance;+2  safety/equipment       General transfer comment: Mod A +2 to initially stand from elevated bed, min A +2 with strong multmodal cues to pivot to recliner    Balance   Sitting-balance support: Bilateral upper extremity supported;Feet supported Sitting balance-Leahy Scale: Fair Sitting balance - Comments: minguard for static sitting balance   Standing balance support: Bilateral upper extremity supported Standing balance-Leahy Scale: Poor Standing balance comment: reliant on trunk and UE support                           ADL either performed or assessed with clinical judgement   ADL Overall ADL's : Needs assistance/impaired Eating/Feeding: Supervision/ safety;Set up;Sitting   Grooming: Wash/dry hands;Wash/dry face;Set up;Sitting Grooming Details (indicate cue type and reason): with warm wash cloth - requires cues to initiate task                 Toilet Transfer: Moderate assistance;+2 for physical assistance;+2 for safety/equipment;Stand-pivot;RW Toilet Transfer Details (indicate cue type and reason): mod A for initial boost and then MIN A +2 for simulated stand pivot from EOB>recliner with RW Toileting- Clothing Manipulation and Hygiene: Total assistance Toileting - Clothing Manipulation Details (indicate cue type and reason): purewick in     Functional mobility during ADLs: Minimal assistance;+2 for physical assistance;Rolling walker General ADL Comments: Pt limited by generalized discomfort, self-limiting, constant moaning/whimpering     Vision   Additional Comments: vc to open eyes throughout session   Perception     Praxis      Cognition Arousal/Alertness: Lethargic Behavior During Therapy: Flat affect (whimpering throughout session, minimally conversive with therapy team) Overall Cognitive Status: Impaired/Different from baseline  Area of Impairment: Attention;Following commands;Safety/judgement;Awareness;Problem solving                    Current Attention Level: Sustained Memory: Decreased short-term memory Following Commands: Follows one step commands with increased time Safety/Judgement: Decreased awareness of safety;Decreased awareness of deficits Awareness: Intellectual Problem Solving: Slow processing;Decreased initiation;Difficulty sequencing;Requires verbal cues;Requires tactile cues General Comments: multimodal cues used throughout session to initiate and encourage participation on behalf of the patient, Pt whimpering but did not interact much verbally with therapy team despite pleasant attempts "It hurts everywhere" was the clearest sentence that she said. Pt self-limiting throughout session and very lethargic        Exercises     Shoulder Instructions       General Comments Pt on 4L O2 throughout session, HR increased to 110 with activity, RR WFL    Pertinent Vitals/ Pain       Pain Assessment: Faces Faces Pain Scale: Hurts even more Pain Location: generalized "it hurts everywhere" Pain Descriptors / Indicators: Aching;Guarding;Moaning;Crying;Discomfort;Constant Pain Intervention(s): Limited activity within patient's tolerance;Monitored during session;Repositioned  Home Living                                          Prior Functioning/Environment              Frequency  Min 2X/week        Progress Toward Goals  OT Goals(current goals can now be found in the care plan section)  Progress towards OT goals: Not progressing toward goals - comment (lethargy limiting)  Acute Rehab OT Goals Patient Stated Goal: none stated today OT Goal Formulation: With patient Time For Goal Achievement: 12/03/20 Potential to Achieve Goals: Good  Plan Discharge plan remains appropriate;Frequency remains appropriate    Co-evaluation    PT/OT/SLP Co-Evaluation/Treatment: Yes Reason for Co-Treatment: Complexity of the patient's impairments (multi-system involvement);Necessary to address  cognition/behavior during functional activity;For patient/therapist safety;To address functional/ADL transfers PT goals addressed during session: Mobility/safety with mobility;Balance;Proper use of DME;Strengthening/ROM OT goals addressed during session: ADL's and self-care;Strengthening/ROM      AM-PAC OT "6 Clicks" Daily Activity     Outcome Measure   Help from another person eating meals?: A Little Help from another person taking care of personal grooming?: A Lot Help from another person toileting, which includes using toliet, bedpan, or urinal?: A Lot Help from another person bathing (including washing, rinsing, drying)?: A Lot Help from another person to put on and taking off regular upper body clothing?: A Lot Help from another person to put on and taking off regular lower body clothing?: Total 6 Click Score: 12    End of Session Equipment Utilized During Treatment: Gait belt;Rolling walker;Oxygen (4L via [place])  OT Visit Diagnosis: Unsteadiness on feet (R26.81);Muscle weakness (generalized) (M62.81);Other symptoms and signs involving cognitive function;Pain Pain - Right/Left: Left Pain - part of body: Ankle and joints of foot   Activity Tolerance Patient limited by lethargy   Patient Left in chair;with call bell/phone within reach;with chair alarm set   Nurse Communication Mobility status        Time: 8841-6606 OT Time Calculation (min): 24 min  Charges: OT General Charges $OT Visit: 1 Visit OT Treatments $Self Care/Home Management : 8-22 mins  Nyoka Cowden OTR/L Acute Rehabilitation Services Pager: [phone] Office: [phone]   Mirra Basilio Hilliard 11/25/2020, 12:47 PM

## 2020-11-25 NOTE — Progress Notes (Signed)
Ativan given prior  to MRI. Reported by MRI tech. that procedure was not done, pt. was not cooperating in straightening her legs.ID NP made aware thru messaging.

## 2020-11-25 NOTE — Progress Notes (Signed)
Moaning every now and then, repositioned for comfort in chair.

## 2020-11-25 NOTE — Progress Notes (Addendum)
I have seen and examined the patient. I have personally reviewed the clinical findings, laboratory findings, microbiological data and imaging studies. The assessment and treatment plan was discussed with the  Advance Practice Provider, Rexene Alberts. I agree with her/his recommendations except following additions/corrections.  One episode of low grade fever yesterday. No leukocytosis, transferred out of ICU service, stable on 4 L [place] Exam: edematous, no change in murmur Clinically appears better than yesterday with less moaning   Hb dropped to 6/7, will get transfused PRBCS today Blood cx 2/18 NG in 4 days Cr still trending up, Nephrology following  Chest Xray: No significant interval change in bilateral peripheral predominant consolidative opacities. MRI Left foot and ankle is pending  Fu HCV RNA  Continue Daptomycin, monitor CPK  CT Sx to determine timing for surgical intervention  Will follow intermittently this week.   Odette Fraction, MD [facility]      [facility]  Date of Admission:  11/15/2020      Total days of antibiotics 11  Daptomycin 2/16 >> current             Doxycycline 2/16 >> 2/20             Vancomycin 2/12 >> 2/15        ASSESSMENT: Anne Bautista is a 35 y.o. 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  Febrile yesterday to 100.5 F. No improvement in inflammatory markers after 11 days of treatment; which is not specific to any process in particular but concerning for lack of source control.   Following R shoulder for any signs of worsening infection - very low threshold to have ortho back to consider wash out. Follow MRI of the L ankle/foot - will send an update to Orthopedic team to see if they can reassess/keep an eye out for the MRI  result.   CXR is stable with cavitary lesions. Oxygen is stable on 4 LPM.  Blood cultures cleared from 2/17 final now. Completed course of doxycycline given severe lung lesions a/w septic emboli to ensure penetration of antibiotics.   AKI with improved BUN today, stable SCr. CK 45. Nephrology following.     PLAN: 1. Continue daptomycin  2. Follow CK for therapeutic drug monitoring 3. Follow SCr for renal recovery 4. Follow MRI (to be done this evening)  5. I updated ortho  6. Follow R shoulder for clinical changes.    Principal Problem:   Severe sepsis due to methicillin resistant Staphylococcus aureus (MRSA) with acute organ dysfunction (HCC) Active Problems:   IV drug abuse (HCC)   Poor dentition   Malnutrition (HCC)   Thrombocytopenia (HCC)   Tobacco dependence   Acute cystitis without hematuria   Malnutrition of moderate degree   . sodium chloride   Intravenous Once  . acetylcysteine  4 mL Nebulization Q6H  . chlorhexidine  15 mL Mouth Rinse BID  . Chlorhexidine Gluconate Cloth  6 each Topical Daily  . docusate sodium  100 mg Oral BID  . feeding supplement  237 mL Oral TID BM  . folic acid  1 mg Oral Daily  . heparin  5,000 Units Subcutaneous Q8H  . mouth rinse  15 mL Mouth Rinse q12n4p  . methadone  10 mg Oral Q8H  . multivitamin with minerals  1 tablet Oral Daily  . pantoprazole  40 mg Oral Daily  . polyethylene glycol  17 g Oral BID  . QUEtiapine  25 mg Oral BID  . senna  2 tablet Oral Daily  . sodium chloride flush  10-40 mL Intracatheter Q12H  . sodium chloride flush  3 mL Intravenous Q12H  . thiamine  100 mg Oral Daily    SUBJECTIVE: Moaning   Review of Systems: Review of Systems  Constitutional: Positive for fever. Negative for chills.  Eyes: Negative for blurred vision and photophobia.  Respiratory: Negative for cough and sputum production.   Cardiovascular: Negative for chest pain.  Gastrointestinal: Negative for diarrhea, nausea and vomiting.   Genitourinary: Negative for dysuria.  Skin: Negative for rash.  Neurological: Negative for headaches.    No Known Allergies  OBJECTIVE: Vitals:   11/25/20 0728 11/25/20 0945 11/25/20 0949 11/25/20 1004  BP:  131/82 131/82 127/82  Pulse:   (!) 105 (!) 104  Resp:   (!) 24 (!) 22  Temp:  98.4 F (36.9 C)  98.1 F (36.7 C)  TempSrc:  Oral  Oral  SpO2: 97%   98%  Weight:      Height:       Body mass index is 28.56 kg/m.  Physical Exam Constitutional:      Appearance: She is ill-appearing.     Comments: Lethargic but opens eyes to voice.   HENT:     Mouth/Throat:     Mouth: Mucous membranes are moist.     Pharynx: Oropharynx is clear.  Eyes:     General: No scleral icterus. Cardiovascular:     Rate and Rhythm: Tachycardia present.     Heart sounds: No murmur heard.   Pulmonary:     Breath sounds: Rhonchi present.  Abdominal:     General: Bowel sounds are normal.     Palpations: Abdomen is soft.  Musculoskeletal:        General: Swelling (generalized ) and tenderness (L foot with overlying redness/warmth) present.  Skin:    General: Skin is warm and dry.     Capillary Refill: Capillary refill takes less than 2 seconds.     Coloration: Skin is pale.     Findings: No rash.  Neurological:     Mental Status: She is oriented to person, place, and time.     Lab Results Lab Results  Component Value Date   WBC 9.7 11/25/2020   HGB 6.7 (LL) 11/25/2020   HCT 20.4 (L) 11/25/2020   MCV 82.3 11/25/2020   PLT 205 11/25/2020    Lab Results  Component Value Date   CREATININE 2.79 (H) 11/25/2020   BUN 68 (H) 11/25/2020   NA 132 (L) 11/25/2020   K 4.6 11/25/2020   CL 103 11/25/2020   CO2 19 (L) 11/25/2020    Lab Results  Component Value Date   ALT 11 11/21/2020   AST 18 11/21/2020   ALKPHOS 89 11/21/2020   BILITOT 1.2 11/21/2020     Microbiology: BCx 2/12 + MRSA  BCx 2/14 + MRSA  BCx 2/17 no growth prelim   Rexene Alberts, MSN, NP-C [facility]  [email or web address] Pager: [phone] Office: [phone] [facility] Main Line: [phone]

## 2020-11-26 ENCOUNTER — Inpatient Hospital Stay (HOSPITAL_COMMUNITY): Payer: Self-pay

## 2020-11-26 LAB — CBC WITH DIFFERENTIAL/PLATELET
Abs Immature Granulocytes: 0.07 10*3/uL (ref 0.00–0.07)
Basophils Absolute: 0 10*3/uL (ref 0.0–0.1)
Basophils Relative: 0 %
Eosinophils Absolute: 0 10*3/uL (ref 0.0–0.5)
Eosinophils Relative: 0 %
HCT: 19.3 % — ABNORMAL LOW (ref 36.0–46.0)
Hemoglobin: 6.6 g/dL — CL (ref 12.0–15.0)
Immature Granulocytes: 1 %
Lymphocytes Relative: 11 %
Lymphs Abs: 1.1 10*3/uL (ref 0.7–4.0)
MCH: 26.8 pg (ref 26.0–34.0)
MCHC: 34.2 g/dL (ref 30.0–36.0)
MCV: 78.5 fL — ABNORMAL LOW (ref 80.0–100.0)
Monocytes Absolute: 0.6 10*3/uL (ref 0.1–1.0)
Monocytes Relative: 6 %
Neutro Abs: 7.8 10*3/uL — ABNORMAL HIGH (ref 1.7–7.7)
Neutrophils Relative %: 82 %
Platelets: 212 10*3/uL (ref 150–400)
RBC: 2.46 MIL/uL — ABNORMAL LOW (ref 3.87–5.11)
RDW: 19.3 % — ABNORMAL HIGH (ref 11.5–15.5)
WBC: 9.6 10*3/uL (ref 4.0–10.5)
nRBC: 0 % (ref 0.0–0.2)

## 2020-11-26 LAB — CULTURE, BLOOD (ROUTINE X 2)
Culture: NO GROWTH
Culture: NO GROWTH
Special Requests: ADEQUATE
Special Requests: ADEQUATE

## 2020-11-26 LAB — COMPREHENSIVE METABOLIC PANEL
ALT: 19 U/L (ref 0–44)
AST: 18 U/L (ref 15–41)
Albumin: 1.4 g/dL — ABNORMAL LOW (ref 3.5–5.0)
Alkaline Phosphatase: 53 U/L (ref 38–126)
Anion gap: 10 (ref 5–15)
BUN: 58 mg/dL — ABNORMAL HIGH (ref 6–20)
CO2: 21 mmol/L — ABNORMAL LOW (ref 22–32)
Calcium: 7.6 mg/dL — ABNORMAL LOW (ref 8.9–10.3)
Chloride: 104 mmol/L (ref 98–111)
Creatinine, Ser: 2.81 mg/dL — ABNORMAL HIGH (ref 0.44–1.00)
GFR, Estimated: 22 mL/min — ABNORMAL LOW (ref >60)
Glucose, Bld: 101 mg/dL — ABNORMAL HIGH (ref 70–99)
Potassium: 4.6 mmol/L (ref 3.5–5.1)
Sodium: 135 mmol/L (ref 135–145)
Total Bilirubin: 0.9 mg/dL (ref 0.3–1.2)
Total Protein: 6.6 g/dL (ref 6.5–8.1)

## 2020-11-26 LAB — BRAIN NATRIURETIC PEPTIDE: B Natriuretic Peptide: 506.5 pg/mL — ABNORMAL HIGH (ref 0.0–100.0)

## 2020-11-26 LAB — GLUCOSE, CAPILLARY: Glucose-Capillary: 126 mg/dL — ABNORMAL HIGH (ref 70–99)

## 2020-11-26 LAB — PREPARE RBC (CROSSMATCH)

## 2020-11-26 LAB — HCV RNA QUANT: HCV Quantitative: NOT DETECTED IU/mL (ref >50)

## 2020-11-26 LAB — C-REACTIVE PROTEIN: CRP: 18 mg/dL — ABNORMAL HIGH (ref <1.0)

## 2020-11-26 LAB — PHOSPHORUS: Phosphorus: 5.3 mg/dL — ABNORMAL HIGH (ref 2.5–4.6)

## 2020-11-26 LAB — PROCALCITONIN: Procalcitonin: 0.81 ng/mL

## 2020-11-26 LAB — MAGNESIUM: Magnesium: 1.9 mg/dL (ref 1.7–2.4)

## 2020-11-26 MED ORDER — PROSOURCE TF PO LIQD
45.0000 mL | Freq: Three times a day (TID) | ORAL | Status: DC
  Administered 2020-11-26 – 2020-12-03 (×16): 45 mL
  Filled 2020-11-26 (×19): qty 45

## 2020-11-26 MED ORDER — OSMOLITE 1.5 CAL PO LIQD
1000.0000 mL | ORAL | Status: DC
  Administered 2020-11-26 – 2020-12-01 (×6): 1000 mL
  Filled 2020-11-26 (×4): qty 1000

## 2020-11-26 MED ORDER — SODIUM CHLORIDE 0.9% IV SOLUTION: Freq: Once | INTRAVENOUS | Status: DC

## 2020-11-26 NOTE — Plan of Care (Signed)
  Problem: Respiratory: Goal: Ability to maintain adequate ventilation will improve Outcome: Progressing   Problem: Education: Goal: Knowledge of General Education information will improve Description: Including pain rating scale, medication(s)/side effects and non-pharmacologic comfort measures Outcome: Progressing   Problem: Health Behavior/Discharge Planning: Goal: Ability to manage health-related needs will improve Outcome: Progressing   

## 2020-11-26 NOTE — Plan of Care (Signed)

## 2020-11-26 NOTE — Procedures (Signed)
Cortrak  Person Inserting Tube:  King, Nolia Tschantz E, RD Tube Type:  Cortrak - 43 inches Tube Location:  Left nare Initial Placement:  Stomach Secured by: Bridle Technique Used to Measure Tube Placement:  Documented cm marking at nare/ corner of mouth Cortrak Secured At:  68 cm    Cortrak Tube Team Note:  Consult received to place a Cortrak feeding tube.   No x-ray is required. RN may begin using tube.   If the tube becomes dislodged please keep the tube and contact the Cortrak team at [email or web address] (password [email or web address]) for replacement.  If after hours and replacement cannot be delayed, place a NG tube and confirm placement with an abdominal x-ray.   Tanai Bouler King, MS, RD, LDN Pager number available on Amion 

## 2020-11-26 NOTE — Progress Notes (Signed)
Nephrology Follow-Up Consult note   Assessment/Recommendations: Anne Bautista is a/an 35 y.o. female with a past medical history significant for IVDU, tobacco use disorder, malnutrition who present w/ likely endocarditis with sepsis  Non-Oliguric AKI: Creatinine on arrival 1.3 improved to 0.8.  Urinalysis with minimal proteinuria and no significant hematuria making infection associated GN less likely.  More likely a mix of ATN from sepsis and septic embolization as well as contrast associated injury. -Care at this time is supportive; Cr seems to have plateaued  - ordered strict ins/outs  Sepsis/MRSA bacteremia/endocarditis: Antibiotics and further management per primary team and infectious disease.    HTN - improved.     Anasarca: Related to volume overload, acute illness, hypoalbuminemia.  Getting PRBC's x 1 unit  Hyperkalemia: improved    Anemia: Multifactorial with kidney disease felt to be contributing less of a factor. PRBC for Hb 7 or less. Got a unit yesterday and she's getting one right now as well    Hypoxic respiratory failure: multifocal and cavitary PNA. on supplemental oxygen  Severe malnutrition: optimize nutrition per primary team   Hyponatremia: Sodium improving    _____________________________________________________  Interval History/Subjective:  strict ins/outs not available.  600 mL and another urine void over 2/22.  States that she's done with drugs. Getting PRBC's during my exam.  Last CXR with stable multfocal and cavitary PNA  Review of systems:  Reports nausea no diarrhea Reports shortness of breath a little better Generalized discomfort about the same  Medications:  Current Facility-Administered Medications  Medication Dose Route Frequency Provider Last Rate Last Admin  . 0.9 %  sodium chloride infusion (Manually program via Guardrails IV Fluids)   Intravenous Once Eduard Clos, MD      . 0.9 %  sodium chloride infusion (Manually program  via Guardrails IV Fluids)   Intravenous Once Eduard Clos, MD      . 0.9 %  sodium chloride infusion   Intravenous PRN Steffanie Dunn, DO   Stopped at 11/23/20 2116  . acetaminophen (TYLENOL) tablet 650 mg  650 mg Oral Q6H PRN Jonah Blue, MD   650 mg at 11/25/20 6314   Or  . acetaminophen (TYLENOL) suppository 650 mg  650 mg Rectal Q6H PRN Jonah Blue, MD      . acetylcysteine (MUCOMYST) 20 % nebulizer / oral solution 4 mL  4 mL Nebulization Q6H Leroy Sea, MD   4 mL at 11/26/20 0304  . bisacodyl (DULCOLAX) suppository 10 mg  10 mg Rectal Daily PRN Dellia Cloud, MD      . chlorhexidine (PERIDEX) 0.12 % solution 15 mL  15 mL Mouth Rinse BID Karie Fetch P, DO   15 mL at 11/25/20 2147  . Chlorhexidine Gluconate Cloth 2 % PADS 6 each  6 each Topical Daily Kirtland Bouchard, MD   6 each at 11/25/20 [phone]  . DAPTOmycin (CUBICIN) 827 mg in sodium chloride 0.9 % IVPB  10 mg/kg Intravenous Q2000 Earnie Larsson, RPH 233.1 mL/hr at 11/25/20 2158 827 mg at 11/25/20 2158  . docusate sodium (COLACE) capsule 100 mg  100 mg Oral BID Max Fickle B, MD   100 mg at 11/24/20 2140  . feeding supplement (ENSURE ENLIVE / ENSURE PLUS) liquid 237 mL  237 mL Oral TID BM Karie Fetch P, DO   237 mL at 11/25/20 2148  . folic acid (FOLVITE) tablet 1 mg  1 mg Oral Daily Jonah Blue, MD   1 mg at 11/25/20 0913  .  heparin injection 5,000 Units  5,000 Units Subcutaneous Q8H Leslye Peer, MD   5,000 Units at 11/26/20 [phone]  . HYDROmorphone (DILAUDID) injection 1 mg  1 mg Intravenous Q4H PRN Leslye Peer, MD   1 mg at 11/21/20 1916  . levalbuterol (XOPENEX) nebulizer solution 0.63 mg  0.63 mg Nebulization Q6H PRN Leslye Peer, MD   0.63 mg at 11/26/20 0304  . LORazepam (ATIVAN) injection 0.5-1 mg  0.5-1 mg Intravenous Q6H PRN Lupita Leash, MD   1 mg at 11/26/20 0256  . MEDLINE mouth rinse  15 mL Mouth Rinse q12n4p Fani, Rotondo P, DO   15 mL at 11/25/20 1621  . methadone  (DOLOPHINE) tablet 10 mg  10 mg Oral Q8H Leslye Peer, MD   10 mg at 11/26/20 2595  . metoprolol tartrate (LOPRESSOR) injection 2.5 mg  2.5 mg Intravenous Q6H PRN Regalado, Belkys A, MD   2.5 mg at 11/23/20 2119  . multivitamin with minerals tablet 1 tablet  1 tablet Oral Daily Odette Fraction, MD   1 tablet at 11/25/20 1619  . nicotine (NICODERM CQ - dosed in mg/24 hours) patch 14 mg  14 mg Transdermal Daily PRN Jonah Blue, MD      . oxyCODONE (Oxy IR/ROXICODONE) immediate release tablet 5 mg  5 mg Oral Q6H PRN Leslye Peer, MD   5 mg at 11/25/20 1619  . pantoprazole (PROTONIX) EC tablet 40 mg  40 mg Oral Daily Leroy Sea, MD   40 mg at 11/25/20 0912  . polyethylene glycol (MIRALAX / GLYCOLAX) packet 17 g  17 g Oral BID Max Fickle B, MD   17 g at 11/24/20 2141  . QUEtiapine (SEROQUEL) tablet 25 mg  25 mg Oral BID Dellia Cloud, MD   25 mg at 11/25/20 2147  . senna (SENOKOT) tablet 17.2 mg  2 tablet Oral Daily Dellia Cloud, MD   17.2 mg at 11/24/20 0900  . sodium chloride flush (NS) 0.9 % injection 10-40 mL  10-40 mL Intracatheter Q12H Leslye Peer, MD   10 mL at 11/25/20 2200  . sodium chloride flush (NS) 0.9 % injection 3 mL  3 mL Intravenous Q12H Jonah Blue, MD   3 mL at 11/25/20 2200  . thiamine tablet 100 mg  100 mg Oral Daily Jonah Blue, MD   100 mg at 11/25/20 0913    Physical Exam: Vitals:   11/26/20 0500 11/26/20 0515  BP:  130/78  Pulse: (!) 119 (!) 116  Resp: 20 20  Temp:  98.9 F (37.2 C)  SpO2: 95% 96%   No intake/output data recorded.  Intake/Output Summary (Last 24 hours) at 11/26/2020 6387 Last data filed at 11/25/2020 1700 Gross per 24 hour  Intake 982.92 ml  Output 600 ml  Net 382.92 ml   General adult female in bed   HEENT normocephalic atraumatic extraocular movements intact sclera anicteric Neck supple trachea midline Lungs reduced breath sounds; on 2 liters oxygen Heart tachycardic S1S2 no rub Abdomen soft nontender  nondistended Extremities no pitting edema  Psych anxious mood and affect; moaning  Neuro awake and conversant    Test Results I personally reviewed new and old clinical labs and radiology tests Lab Results  Component Value Date   NA 135 11/26/2020   K 4.6 11/26/2020   CL 104 11/26/2020   CO2 21 (L) 11/26/2020   BUN 58 (H) 11/26/2020   CREATININE 2.81 (H) 11/26/2020   CALCIUM 7.6 (L) 11/26/2020  ALBUMIN 1.4 (L) 11/26/2020   PHOS 7.5 (H) 11/25/2020    Estanislado Emms, MD 11/26/2020 6:50 AM

## 2020-11-26 NOTE — Progress Notes (Signed)
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  She was admitted by critical care, she was seen by ID, cardiothoracic surgery, orthopedics.  She was transferred to hospitalist service on 11/25/2020 on day 9 of hospital stay.  11/26/20: Seen and examined at her bedside.  She reports abdominal pain with no nausea or vomiting.  Also reports pleuritic pain.  She is currently being treated for MRSA bacteremia as well as infectious endocarditis.  Infectious disease following.  Assessment/Plan: Principal Problem:   Severe sepsis due to methicillin resistant Staphylococcus aureus (MRSA) with acute organ dysfunction (HCC) Active Problems:   IV drug abuse (HCC)   Poor dentition   Malnutrition (HCC)   Thrombocytopenia (HCC)   Tobacco dependence   Acute cystitis without hematuria   Malnutrition of moderate degree   Sepsis secondary to MRSA bacteremia, POA Infectious tricuspid valve endocarditis Septic emboli to both lungs, kidneys, right shoulder, possible left ankle. Seen by infectious disease. She is currently on IV daptomycin. Seen by cardiothoracic surgery, PCCM and orthopedics. She underwent a right shoulder aspiration showing staph aureus as well. She has been counseled on the importance of IV drug abuse cessation. She had a CT head that was done on 11/15/2020 which showed normal appearance of the brain itself.  She had air-fluid level in the right maxillary sinus suggesting  rhinosinusitis.  AKI likely multifactorial, ATN Baseline creatinine appears to be 0.8 with GFR greater than sixty Creatinine is uptrending, 2.8 this morning. Continue to avoid nephrotoxins Monitor urine output Repeat BMP in the morning.  Acute hypoxic respiratory failure likely multifactorial secondary bilateral septic pulmonary infarcts and symptomatic anemia. Normal oxygen supplementation at baseline Currently requiring 2 L to maintain O2 saturation greater than 90%.  Acute blood loss anemia/iron deficiency anemia She has received 1 unit PRBC on 11/25/2020. Hemoglobin dropped this morning less than 7.0 We'll transfuse another unit. No evidence of active bleeding Continue to monitor H&H  GERD Continue PPI  Chronic constipation Continue bowel regimen  Moderate to severe protein calorie malnutrition Albumin 1.4 Moderate to severe muscle mass loss. Encourage increase in oral protein calorie intake.    Code Status: Full code.  Family Communication: None at bedside.  Disposition Plan: Likely will DC to home when clinically cleared.   Consultants:  Infectious disease  PCCM  Nephrology  Orthopedic surgery  Cardiothoracic surgery.  Procedures:  2D echo, bilateral lower extremity Doppler ultrasound.  Right shoulder joint aspiration.  CT head.  Antimicrobials:  IV daptomycin.  DVT prophylaxis: SQ heparin 3 times daily.  Status is: Inpatient    Dispo: The patient is from: Home.              Anticipated d/c is to: Home.              Anticipated d/c date is: 12/02/20              Patient currently no stable for discharge.   Difficult to place patient not applicable.        Objective: Vitals:  11/26/20 0814 11/26/20 0839 11/26/20 0842 11/26/20 1130  BP: (!) 156/98     Pulse: (!) 125  (!) 126 (!) 111  Resp: (!) 24 (!) 29 (!) 23   Temp: 99 F (37.2 C)   98.2 F (36.8 C)  TempSrc: Oral   Oral  SpO2: 94%  100% 94%  Weight:      Height:         Intake/Output Summary (Last 24 hours) at 11/26/2020 1304 Last data filed at 11/26/2020 1024 Gross per 24 hour  Intake 741 ml  Output 1400 ml  Net -659 ml   Filed Weights   11/17/20 0515 11/17/20 1100 11/24/20 0330  Weight: 78 kg 77 kg 82.7 kg    Exam:  . General: 35 y.o. year-old female well developed well nourished in no acute distress.  Alert and oriented x3.  Appears uncomfortable due to abdominal pain. . Cardiovascular: Regular rate and rhythm with no rubs or gallops.  No thyromegaly or JVD noted.   Marland Kitchen Respiratory: Clear to auscultation with no wheezes or rales. Good inspiratory effort. . Abdomen: Soft diffuse abdominal pain nondistended with bowel sounds present.   . Musculoskeletal: 1+ pitting edema lower extremities bilaterally.   . Skin: No ulcerative lesions noted or rashes, . Psychiatry: Mood is appropriate for condition and setting   Data Reviewed: CBC: Recent Labs  Lab 11/21/20 0607 11/22/20 1045 11/23/20 0217 11/24/20 0355 11/25/20 0534 11/25/20 1545 11/26/20 0418  WBC 9.6  --  11.8* 10.4 9.7  --  9.6  NEUTROABS  --   --   --   --   --   --  7.8*  HGB 7.8*   < > 8.2* 7.4* 6.7* 8.2* 6.6*  HCT 23.3*   < > 24.9* 22.9* 20.4* 25.3* 19.3*  MCV 79.3*  --  81.6 82.4 82.3  --  78.5*  PLT 92*  --  175 205 205  --  212   < > = values in this interval not displayed.   Basic Metabolic Panel: Recent Labs  Lab 11/22/20 1400 11/23/20 0217 11/24/20 0355 11/25/20 0534 11/26/20 0418  NA 135 133* 134* 132* 135  K 5.6* 5.0 5.1 4.6 4.6  CL 106 105 106 103 104  CO2 19* 17* 19* 19* 21*  GLUCOSE 80 95 99 125* 101*  BUN 93* 90* 82* 68* 58*  CREATININE 2.76* 2.73* 2.75* 2.79* 2.81*  CALCIUM 6.8* 6.9* 7.3* 7.8* 7.6*  MG  --  2.7* 2.6* 2.3 1.9  PHOS  --  8.7* 7.4* 7.5*  --    GFR: Estimated Creatinine Clearance: 30.9 mL/min (A) (by C-G formula based on SCr of 2.81 mg/dL (H)). Liver Function Tests: Recent Labs  Lab 11/21/20 0526 11/22/20 1400 11/23/20 0217  11/26/20 0418  AST 18  --   --  18  ALT 11  --   --  19  ALKPHOS 89  --   --  53  BILITOT 1.2  --   --  0.9  PROT 6.0*  --   --  6.6  ALBUMIN 1.1* 1.5* 1.4* 1.4*   No results for input(s): LIPASE, AMYLASE in the last 168 hours. No results for input(s): AMMONIA in the last 168 hours. Coagulation Profile: No results for input(s): INR, PROTIME in the last 168 hours. Cardiac Enzymes: Recent Labs  Lab 11/20/20 0253 11/23/20 0217  CKTOTAL 280* 45   BNP (last 3 results) No results for input(s): PROBNP in the last 8760 hours. HbA1C: No results for  input(s): HGBA1C in the last 72 hours. CBG: Recent Labs  Lab 11/23/20 2015  GLUCAP 113*   Lipid Profile: No results for input(s): CHOL, HDL, LDLCALC, TRIG, CHOLHDL, LDLDIRECT in the last 72 hours. Thyroid Function Tests: No results for input(s): TSH, T4TOTAL, FREET4, T3FREE, THYROIDAB in the last 72 hours. Anemia Panel: Recent Labs    11/25/20 0817  VITAMINB12 2,498*  FOLATE 11.4  FERRITIN 517*  TIBC 136*  IRON 11*  RETICCTPCT 1.1   Urine analysis:    Component Value Date/Time   COLORURINE YELLOW 11/15/2020 1947   APPEARANCEUR HAZY (A) 11/15/2020 1947   LABSPEC 1.020 11/15/2020 1947   PHURINE 6.0 11/15/2020 1947   GLUCOSEU NEGATIVE 11/15/2020 1947   HGBUR MODERATE (A) 11/15/2020 1947   BILIRUBINUR NEGATIVE 11/15/2020 1947   KETONESUR NEGATIVE 11/15/2020 1947   PROTEINUR 30 (A) 11/15/2020 1947   NITRITE POSITIVE (A) 11/15/2020 1947   LEUKOCYTESUR TRACE (A) 11/15/2020 1947   Sepsis Labs: (procalcitonin:4,lacticidven:4)  ) Recent Results (from the past 240 hour(s))  Culture, blood (routine x 2)     Status: None   Collection Time: 11/17/20  9:28 AM   Specimen: BLOOD RIGHT FOREARM  Result Value Ref Range Status   Specimen Description BLOOD RIGHT FOREARM  Final   Special Requests   Final    BOTTLES DRAWN AEROBIC AND ANAEROBIC Blood Culture results may not be optimal due to an inadequate volume of blood  received in culture bottles   Culture   Final    NO GROWTH 5 DAYS Performed at [facility], [address] [place], [place] [address]    Report Status 11/22/2020 FINAL  Final  Culture, blood (routine x 2)     Status: Abnormal   Collection Time: 11/17/20  9:39 AM   Specimen: BLOOD RIGHT HAND  Result Value Ref Range Status   Specimen Description BLOOD RIGHT HAND  Final   Special Requests   Final    BOTTLES DRAWN AEROBIC AND ANAEROBIC Blood Culture results may not be optimal due to an inadequate volume of blood received in culture bottles   Culture  Setup Time   Final    GRAM POSITIVE COCCI IN CLUSTERS ANAEROBIC BOTTLE ONLY Organism ID to follow CRITICAL RESULT CALLED TO, READ BACK BY AND VERIFIED WITHThresa Ross PHARMD, AT 1914 11/20/20 Renato Shin Performed at [facility], [address], [place], [place] [address]    Culture METHICILLIN RESISTANT STAPHYLOCOCCUS AUREUS (A)  Final   Report Status 11/22/2020 FINAL  Final   Organism ID, Bacteria METHICILLIN RESISTANT STAPHYLOCOCCUS AUREUS  Final      Susceptibility   Methicillin resistant staphylococcus aureus - MIC*    CIPROFLOXACIN <=0.5 SENSITIVE Sensitive     ERYTHROMYCIN >=8 RESISTANT Resistant     GENTAMICIN <=0.5 SENSITIVE Sensitive     OXACILLIN >=4 RESISTANT Resistant     TETRACYCLINE <=1 SENSITIVE Sensitive     VANCOMYCIN 1 SENSITIVE Sensitive     TRIMETH/SULFA <=10 SENSITIVE Sensitive     CLINDAMYCIN <=0.25 SENSITIVE Sensitive     RIFAMPIN <=0.5 SENSITIVE Sensitive     Inducible Clindamycin NEGATIVE Sensitive     * METHICILLIN RESISTANT STAPHYLOCOCCUS AUREUS  Blood Culture ID Panel (Reflexed)     Status: Abnormal   Collection Time: 11/17/20  9:39 AM  Result Value Ref Range Status   Enterococcus faecalis NOT DETECTED NOT DETECTED Final   Enterococcus Faecium NOT DETECTED NOT DETECTED Final   Listeria monocytogenes NOT DETECTED NOT DETECTED Final  Staphylococcus species DETECTED (A) NOT DETECTED Final     Comment: CRITICAL RESULT CALLED TO, READ BACK BY AND VERIFIED WITHThresa Ross PHARMD, AT 4098 11/20/20 D. VANHOOK    Staphylococcus aureus (BCID) DETECTED (A) NOT DETECTED Final    Comment: Methicillin (oxacillin)-resistant Staphylococcus aureus (MRSA). MRSA is predictably resistant to beta-lactam antibiotics (except ceftaroline). Preferred therapy is vancomycin unless clinically contraindicated. Patient requires contact precautions if  hospitalized. CRITICAL RESULT CALLED TO, READ BACK BY AND VERIFIED WITHThresa Ross PHARMD, AT 1191 11/20/20 D. VANHOOK    Staphylococcus epidermidis NOT DETECTED NOT DETECTED Final   Staphylococcus lugdunensis NOT DETECTED NOT DETECTED Final   Streptococcus species NOT DETECTED NOT DETECTED Final   Streptococcus agalactiae NOT DETECTED NOT DETECTED Final   Streptococcus pneumoniae NOT DETECTED NOT DETECTED Final   Streptococcus pyogenes NOT DETECTED NOT DETECTED Final   A.calcoaceticus-baumannii NOT DETECTED NOT DETECTED Final   Bacteroides fragilis NOT DETECTED NOT DETECTED Final   Enterobacterales NOT DETECTED NOT DETECTED Final   Enterobacter cloacae complex NOT DETECTED NOT DETECTED Final   Escherichia coli NOT DETECTED NOT DETECTED Final   Klebsiella aerogenes NOT DETECTED NOT DETECTED Final   Klebsiella oxytoca NOT DETECTED NOT DETECTED Final   Klebsiella pneumoniae NOT DETECTED NOT DETECTED Final   Proteus species NOT DETECTED NOT DETECTED Final   Salmonella species NOT DETECTED NOT DETECTED Final   Serratia marcescens NOT DETECTED NOT DETECTED Final   Haemophilus influenzae NOT DETECTED NOT DETECTED Final   Neisseria meningitidis NOT DETECTED NOT DETECTED Final   Pseudomonas aeruginosa NOT DETECTED NOT DETECTED Final   Stenotrophomonas maltophilia NOT DETECTED NOT DETECTED Final   Candida albicans NOT DETECTED NOT DETECTED Final   Candida auris NOT DETECTED NOT DETECTED Final   Candida glabrata NOT DETECTED NOT DETECTED Final   Candida  krusei NOT DETECTED NOT DETECTED Final   Candida parapsilosis NOT DETECTED NOT DETECTED Final   Candida tropicalis NOT DETECTED NOT DETECTED Final   Cryptococcus neoformans/gattii NOT DETECTED NOT DETECTED Final   Meth resistant mecA/C and MREJ DETECTED (A) NOT DETECTED Final    Comment: CRITICAL RESULT CALLED TO, READ BACK BY AND VERIFIED WITHThresa Ross PHARMD, AT 4782 11/20/20 Renato Shin Performed at [facility], [address], [place], [place] [address]   MRSA PCR Screening     Status: Abnormal   Collection Time: 11/17/20 11:38 AM   Specimen: Nasopharyngeal  Result Value Ref Range Status   MRSA by PCR POSITIVE (A) NEGATIVE Final    Comment:        The GeneXpert MRSA Assay (FDA approved for NASAL specimens only), is one component of a comprehensive MRSA colonization surveillance program. It is not intended to diagnose MRSA infection nor to guide or monitor treatment for MRSA infections. RESULT CALLED TO, READ BACK BY AND VERIFIED WITH: R ROBERTS RN 1400 11/17/20 A BROWNING Performed at [facility], [address], [place], [place] [address]   Body fluid culture w Gram Stain     Status: None   Collection Time: 11/17/20  4:05 PM   Specimen: Synovium; Body Fluid  Result Value Ref Range Status   Specimen Description SYNOVIAL RIGHT SHOULDER  Final   Special Requests NONE  Final   Gram Stain   Final    ABUNDANT WBC PRESENT, PREDOMINANTLY PMN NO ORGANISMS SEEN    Culture   Final    RARE METHICILLIN RESISTANT STAPHYLOCOCCUS AUREUS CRITICAL RESULT CALLED TO, READ BACK BY AND VERIFIED  WITH: RN T.SHELTON AT 1211 ON 11/19/2020 BY T.SAAD Performed at [facility], [address] [place], [place] [address]    Report Status 11/21/2020 FINAL  Final   Organism ID, Bacteria METHICILLIN RESISTANT STAPHYLOCOCCUS AUREUS  Final      Susceptibility   Methicillin resistant staphylococcus aureus - MIC*    CIPROFLOXACIN <=0.5 SENSITIVE Sensitive     ERYTHROMYCIN >=8  RESISTANT Resistant     GENTAMICIN <=0.5 SENSITIVE Sensitive     OXACILLIN >=4 RESISTANT Resistant     TETRACYCLINE <=1 SENSITIVE Sensitive     VANCOMYCIN <=0.5 SENSITIVE Sensitive     TRIMETH/SULFA <=10 SENSITIVE Sensitive     CLINDAMYCIN <=0.25 SENSITIVE Sensitive     RIFAMPIN <=0.5 SENSITIVE Sensitive     Inducible Clindamycin NEGATIVE Sensitive     * RARE METHICILLIN RESISTANT STAPHYLOCOCCUS AUREUS  Culture, blood (Routine X 2) w Reflex to ID Panel     Status: None   Collection Time: 11/21/20  5:26 AM   Specimen: BLOOD RIGHT HAND  Result Value Ref Range Status   Specimen Description BLOOD RIGHT HAND  Final   Special Requests   Final    BOTTLES DRAWN AEROBIC ONLY Blood Culture adequate volume   Culture   Final    NO GROWTH 5 DAYS Performed at [facility], [address] [place], [place] [address]    Report Status 11/26/2020 FINAL  Final  Culture, blood (Routine X 2) w Reflex to ID Panel     Status: None   Collection Time: 11/21/20  5:26 AM   Specimen: BLOOD LEFT HAND  Result Value Ref Range Status   Specimen Description BLOOD LEFT HAND  Final   Special Requests   Final    BOTTLES DRAWN AEROBIC ONLY Blood Culture adequate volume   Culture   Final    NO GROWTH 5 DAYS Performed at [facility], [address], [place], [place] [address]    Report Status 11/26/2020 FINAL  Final      Studies: No results found.  Scheduled Meds: . sodium chloride   Intravenous Once  . sodium chloride   Intravenous Once  . acetylcysteine  4 mL Nebulization Q6H  . chlorhexidine  15 mL Mouth Rinse BID  . Chlorhexidine Gluconate Cloth  6 each Topical Daily  . docusate sodium  100 mg Oral BID  . feeding supplement  237 mL Oral TID BM  . folic acid  1 mg Oral Daily  . heparin  5,000 Units Subcutaneous Q8H  . mouth rinse  15 mL Mouth Rinse q12n4p  . methadone  10 mg Oral Q8H  . multivitamin with minerals  1 tablet Oral Daily  . pantoprazole  40 mg Oral Daily  . polyethylene  glycol  17 g Oral BID  . QUEtiapine  25 mg Oral BID  . senna  2 tablet Oral Daily  . sodium chloride flush  10-40 mL Intracatheter Q12H  . sodium chloride flush  3 mL Intravenous Q12H  . thiamine  100 mg Oral Daily    Continuous Infusions: . sodium chloride Stopped (11/23/20 2116)  . DAPTOmycin (CUBICIN)  IV 827 mg (11/25/20 2158)     LOS: 10 days     Darlin Drop, MD [facility] Pager [phone]  If 7PM-7AM, please contact night-coverage [email or web address] Password [facility] 11/26/2020, 1:04 PM

## 2020-11-26 NOTE — Progress Notes (Addendum)
Nutrition Follow-up  DOCUMENTATION CODES:   Non-severe (moderate) malnutrition in context of social or environmental circumstances  INTERVENTION:   -Pt day 10 without adequate nutrition, recommend Cortrak placement and initiation of tube feeding:   Recommend Cortrak TF regimen as follows:  Initiate Osmolite 1.5 at 20 mL/hr increase by 10 mL every 6 hours to goal rate of 55 mL/hr.  PROSource TF TID per tube.   This TF regimen provides 2100 kcal, 127 grams protein, 1003 mL water.   -Continue Ensure Enlive po TID, each supplement provides 350 kcal and 20 grams or protein   -Continue MVI with minerals po daily  Pt at high refeed risk; recommend monitoring potassium, magnesium, and phosphorus labs daily until stable.  NUTRITION DIAGNOSIS:   Moderate Malnutrition related to social / environmental circumstances (IVDU) as evidenced by mild fat depletion,moderate fat depletion,mild muscle depletion,moderate muscle depletion.  Ongoing.  GOAL:   Patient will meet greater than or equal to 90% of their needs  Progressing.  MONITOR:   PO intake,Supplement acceptance,Labs,Weight trends,Skin,I & O's  REASON FOR ASSESSMENT:   Consult Assessment of nutrition requirement/status  ASSESSMENT:   20 YOF admitted for severe sepsis w/ MRSA acute organ dysfunction. Septic emboli to both lungs, kidneys, R should, L ankle. Hx of IVDU, acute resp failure, tachypnea, AKI, cellulitis on L ankle, chronic constipation.  02/20 - SLP eval, recommendation for full liquid diet  02/23 - SLP eval, Dysphagia 1 diet with thin liquids  Per pt's chart, pt has been consuming 10-40% of meals since 02/18. Spoke with RN as pt was unable to wake. RN discussed with intern that pt has had a poor po intake, however was hopeful that this would increase due to recent diet advancement. RN mentioned that pt has been consuming some of her Ensures, but is not able to finish them fully. Pt without adequate nutrition for 10  days, RD discussed with MD and ok to place Cortrak.   Meds Reviewed: Folvite (daily), Miralax (BID), Thiamine (daily)  Labs Reviewed.   Diet Order:   Diet Order            Diet full liquid Room service appropriate? Yes; Fluid consistency: Thin  Diet effective now                 EDUCATION NEEDS:   No education needs have been identified at this time  Skin:  Skin Assessment: Reviewed RN Assessment  Last BM:  2/22 (type 6)  Height:   Ht Readings from Last 1 Encounters:  11/16/20 5\' 7"  (1.702 m)    Weight:   Wt Readings from Last 1 Encounters:  11/24/20 82.7 kg    Ideal Body Weight:  61.4 kg  BMI:  Body mass index is 28.56 kg/m.  Estimated Nutritional Needs:   Kcal:  2100-2300  Protein:  115-130 grams  Fluid:  > 2 L    11/26/20, Dietetic Intern 11/26/2020 3:16 PM

## 2020-11-26 NOTE — Progress Notes (Addendum)
[facility] Infectious Diseases Follow Up Note  Patient Identification: Patient Name: Anne Bautista MRN: [id number] Admit Date: 11/15/2020  3:21 PM Age: 35 y.o.Today's Date: 11/26/2020   Reason for Visit: endocarditis   Principal Problem:   Severe sepsis due to methicillin resistant Staphylococcus aureus (MRSA) with acute organ dysfunction [facility]) Active Problems:   IV drug abuse (HCC)   Poor dentition   Malnutrition (HCC)   Thrombocytopenia (HCC)   Tobacco dependence   Acute cystitis without hematuria   Malnutrition of moderate degree  Antibiotics:Cefepime 2/12-2/13 Vancomycin 2/12- 2/15 Daptomycin 2/16- Doxycyline 2/16-2/20  Lines/Tubes:PIVs, urinary catheter   Interval Events: afebrile, no leukocytosis, stable oxygen requirement at 2L.    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 Xray with cellulitis refused MRI  # IVDU # Thrombocytoenia - Improving, in the setting of sepsis and endocarditis # AKIon CKD - Nephro has been consulted  # Acute anemia- hb 6.6 , getting transfused  # Anasarca/Hypoalbuminemia  Recommendations Continue Daptomycin as is, CPK 45  Follow up with CT surgery regarding timing of surgical intervention Follow up with Ortho for left ankle pain and swelling [place] of left ankle/foot pain  Monitor CPK  Will follow intermittently   Rest of the management as per the primary team. Thank you for the consult. Please page with pertinent questions or concerns.  ______________________________________________________________________ Subjective patient seen and examined at the bedside. Sitting up in bed, complains  of diffuse body pain,moaning ( lesser in severity)   Vitals BP 123/87 (BP Location: Right Wrist)   Pulse (!) 111   Temp 98.2 F (36.8 C) (Oral)   Resp (!) 23   Ht 5\' 7"  (1.702 m)   Wt 82.7 kg   LMP 11/10/2020   SpO2 94%   BMI 28.56 kg/m     Physical Exam Moaning and drowsy on Nasal cannula HEENT - oral mucosa very dry Chest - coarse breath sounds CVS-Normal s1s2, RRR systolic murmur  Abdomen - soft, NT Extremities - left ankle and foot swelling/redness,  Anasarca+  Pertinent Microbiology Results for orders placed or performed during the hospital encounter of 11/15/20  Resp Panel by RT-PCR (Flu A&B, Covid) Peripheral     Status: None   Collection Time: 11/15/20  4:10 PM   Specimen: Peripheral; Nasopharyngeal(NP) swabs in vial transport medium  Result Value Ref Range Status   SARS Coronavirus 2 by RT PCR NEGATIVE NEGATIVE Final    Comment: (NOTE) SARS-CoV-2 target nucleic acids are NOT DETECTED.  The SARS-CoV-2 RNA is generally detectable in upper respiratory specimens during the acute phase of infection. The lowest concentration of SARS-CoV-2 viral copies this assay can detect is 138 copies/mL. A negative result does not preclude SARS-Cov-2 infection and should not be used as the sole basis for treatment or other patient management decisions. A negative result may occur with  improper specimen collection/handling, submission of specimen other than nasopharyngeal swab, presence of viral mutation(s) within the areas targeted by this assay, and inadequate number of viral copies(<138 copies/mL). A negative result must be combined with clinical observations, patient history, and epidemiological information. The expected result is Negative.  Fact Sheet for Patients:  01/13/21  Fact Sheet for Healthcare Providers:  BloggerCourse.com  This test is no t yet approved or cleared by the [employer]  FDA and  has been  authorized for detection and/or diagnosis of SARS-CoV-2 by FDA under an Emergency Use Authorization (EUA). This EUA will remain  in effect (meaning this test can be used) for the duration of the COVID-19 declaration under Section 564(b)(1) of the Act, 21 U.S.C.section 360bbb-3(b)(1), unless the authorization is terminated  or revoked sooner.       Influenza A by PCR NEGATIVE NEGATIVE Final   Influenza B by PCR NEGATIVE NEGATIVE Final    Comment: (NOTE) The Xpert Xpress SARS-CoV-2/FLU/RSV plus assay is intended as an aid in the diagnosis of influenza from Nasopharyngeal swab specimens and should not be used as a sole basis for treatment. Nasal washings and aspirates are unacceptable for Xpert Xpress SARS-CoV-2/FLU/RSV testing.  Fact Sheet for Patients: BloggerCourse.com  Fact Sheet for Healthcare Providers: SeriousBroker.it  This test is not yet approved or cleared by the [place] FDA and has been authorized for detection and/or diagnosis of SARS-CoV-2 by FDA under an Emergency Use Authorization (EUA). This EUA will remain in effect (meaning this test can be used) for the duration of the COVID-19 declaration under Section 564(b)(1) of the Act, 21 U.S.C. section 360bbb-3(b)(1), unless the authorization is terminated or revoked.  Performed at [facility], [address] [place], [place] [address]   Blood culture (routine x 2)     Status: Abnormal   Collection Time: 11/15/20  4:10 PM   Specimen: BLOOD  Result Value Ref Range Status   Specimen Description   Final    BLOOD LEFT ANTECUBITAL Performed at [facility], [address] [place], [place] [address]    Special Requests   Final    BOTTLES DRAWN AEROBIC AND ANAEROBIC Blood Culture adequate volume Performed at [facility], [address] [place], [place] [address]    Culture  Setup Time   Final    GRAM POSITIVE COCCI IN BOTH  AEROBIC AND ANAEROBIC BOTTLES CRITICAL RESULT CALLED TO, READ BACK BY AND VERIFIED WITH: CINDY REED,RN  11/16/20 EB Performed at [facility], [address] [place], [place] [address]    Culture METHICILLIN RESISTANT STAPHYLOCOCCUS AUREUS (A)  Final   Report Status 11/18/2020 FINAL  Final   Organism ID, Bacteria METHICILLIN RESISTANT STAPHYLOCOCCUS AUREUS  Final      Susceptibility   Methicillin resistant staphylococcus aureus - MIC*    CIPROFLOXACIN <=0.5 SENSITIVE Sensitive     ERYTHROMYCIN >=8 RESISTANT Resistant     GENTAMICIN <=0.5 SENSITIVE Sensitive     OXACILLIN >=4 RESISTANT Resistant     TETRACYCLINE <=1 SENSITIVE Sensitive     VANCOMYCIN 1 SENSITIVE Sensitive     TRIMETH/SULFA <=10 SENSITIVE Sensitive     CLINDAMYCIN <=0.25 SENSITIVE Sensitive     RIFAMPIN <=0.5 SENSITIVE Sensitive     Inducible Clindamycin NEGATIVE Sensitive     * METHICILLIN RESISTANT STAPHYLOCOCCUS AUREUS  Blood Culture ID Panel (Reflexed)     Status: Abnormal   Collection Time: 11/15/20  4:10 PM  Result Value Ref Range Status   Enterococcus faecalis NOT DETECTED NOT DETECTED Final   Enterococcus Faecium NOT DETECTED NOT DETECTED Final   Listeria monocytogenes NOT DETECTED NOT DETECTED Final   Staphylococcus species DETECTED (A) NOT DETECTED Final    Comment: CRITICAL RESULT CALLED TO, READ BACK BY AND VERIFIED WITH: CINDY REED,RN  11/16/20 EB    Staphylococcus aureus (BCID) DETECTED (A) NOT DETECTED Final    Comment: Methicillin (oxacillin)-resistant Staphylococcus aureus (MRSA).  MRSA is predictably resistant to beta-lactam antibiotics (except ceftaroline). Preferred therapy is vancomycin unless clinically contraindicated. Patient requires contact precautions if  hospitalized. CRITICAL RESULT CALLED TO, READ BACK BY AND VERIFIED WITH: CINDY REED,RN  11/16/20 EB    Staphylococcus epidermidis NOT DETECTED NOT DETECTED Final   Staphylococcus lugdunensis NOT DETECTED NOT DETECTED  Final   Streptococcus species NOT DETECTED NOT DETECTED Final   Streptococcus agalactiae NOT DETECTED NOT DETECTED Final   Streptococcus pneumoniae NOT DETECTED NOT DETECTED Final   Streptococcus pyogenes NOT DETECTED NOT DETECTED Final   A.calcoaceticus-baumannii NOT DETECTED NOT DETECTED Final   Bacteroides fragilis NOT DETECTED NOT DETECTED Final   Enterobacterales NOT DETECTED NOT DETECTED Final   Enterobacter cloacae complex NOT DETECTED NOT DETECTED Final   Escherichia coli NOT DETECTED NOT DETECTED Final   Klebsiella aerogenes NOT DETECTED NOT DETECTED Final   Klebsiella oxytoca NOT DETECTED NOT DETECTED Final   Klebsiella pneumoniae NOT DETECTED NOT DETECTED Final   Proteus species NOT DETECTED NOT DETECTED Final   Salmonella species NOT DETECTED NOT DETECTED Final   Serratia marcescens NOT DETECTED NOT DETECTED Final   Haemophilus influenzae NOT DETECTED NOT DETECTED Final   Neisseria meningitidis NOT DETECTED NOT DETECTED Final   Pseudomonas aeruginosa NOT DETECTED NOT DETECTED Final   Stenotrophomonas maltophilia NOT DETECTED NOT DETECTED Final   Candida albicans NOT DETECTED NOT DETECTED Final   Candida auris NOT DETECTED NOT DETECTED Final   Candida glabrata NOT DETECTED NOT DETECTED Final   Candida krusei NOT DETECTED NOT DETECTED Final   Candida parapsilosis NOT DETECTED NOT DETECTED Final   Candida tropicalis NOT DETECTED NOT DETECTED Final   Cryptococcus neoformans/gattii NOT DETECTED NOT DETECTED Final   Meth resistant mecA/C and MREJ DETECTED (A) NOT DETECTED Final    Comment: CRITICAL RESULT CALLED TO, READ BACK BY AND VERIFIED WITH: CINDY REED,RN  11/16/20 EB Performed at [facility], [address], [place], [place] [address]   Blood culture (routine x 2)     Status: Abnormal   Collection Time: 11/15/20  4:25 PM   Specimen: BLOOD  Result Value Ref Range Status   Specimen Description   Final    BLOOD LEFT ANTECUBITAL Performed at [facility], [address] [place], [place] [address]    Special Requests   Final    BOTTLES DRAWN AEROBIC ONLY Blood Culture results may not be optimal due to an inadequate volume of blood received in culture bottles Performed at [facility], [address] [place], [place] [address]    Culture  Setup Time   Final    GRAM POSITIVE COCCI IN CLUSTERS AEROBIC BOTTLE ONLY CRITICAL VALUE NOTED.  VALUE IS CONSISTENT WITH PREVIOUSLY REPORTED AND CALLED VALUE.    Culture (A)  Final    STAPHYLOCOCCUS AUREUS SUSCEPTIBILITIES PERFORMED ON PREVIOUS CULTURE WITHIN THE LAST 5 DAYS. Performed at [facility], [address] [place], [place] [address]    Report Status 11/18/2020 FINAL  Final  Urine culture     Status: Abnormal   Collection Time: 11/15/20  7:47 PM   Specimen: Urine, Catheterized  Result Value Ref Range Status   Specimen Description   Final    URINE, CATHETERIZED Performed at [facility], [address] [place], [place] [address]    Special Requests   Final    Normal Performed at [facility], [address] [place], [place] [address]  Culture (A)  Final    >=100,000 COLONIES/mL METHICILLIN RESISTANT STAPHYLOCOCCUS AUREUS   Report Status 11/18/2020 FINAL  Final   Organism ID, Bacteria METHICILLIN RESISTANT STAPHYLOCOCCUS AUREUS (A)  Final      Susceptibility   Methicillin resistant staphylococcus aureus - MIC*    CIPROFLOXACIN <=0.5 SENSITIVE Sensitive     GENTAMICIN <=0.5 SENSITIVE Sensitive     NITROFURANTOIN <=16 SENSITIVE Sensitive     OXACILLIN >=4 RESISTANT Resistant     TETRACYCLINE <=1 SENSITIVE Sensitive     VANCOMYCIN <=0.5 SENSITIVE Sensitive     TRIMETH/SULFA <=10 SENSITIVE Sensitive     CLINDAMYCIN <=0.25 SENSITIVE Sensitive     RIFAMPIN <=0.5 SENSITIVE Sensitive     Inducible Clindamycin NEGATIVE Sensitive     * >=100,000 COLONIES/mL METHICILLIN RESISTANT STAPHYLOCOCCUS AUREUS  Culture, blood (single)     Status:  Abnormal   Collection Time: 11/15/20  9:35 PM   Specimen: BLOOD RIGHT HAND  Result Value Ref Range Status   Specimen Description   Final    BLOOD RIGHT HAND Performed at [facility], [address] [place], [place] [address]    Special Requests   Final    BOTTLES DRAWN AEROBIC AND ANAEROBIC Blood Culture adequate volume Performed at [facility], [address] [place], [place] [address]    Culture  Setup Time   Final    GRAM POSITIVE COCCI IN CLUSTERS IN BOTH AEROBIC AND ANAEROBIC BOTTLES CRITICAL VALUE NOTED.  VALUE IS CONSISTENT WITH PREVIOUSLY REPORTED AND CALLED VALUE.    Culture (A)  Final    STAPHYLOCOCCUS AUREUS SUSCEPTIBILITIES PERFORMED ON PREVIOUS CULTURE WITHIN THE LAST 5 DAYS. Performed at [facility], [address] [place], [place] [address]    Report Status 11/20/2020 FINAL  Final  Culture, blood (routine x 2)     Status: None   Collection Time: 11/17/20  9:28 AM   Specimen: BLOOD RIGHT FOREARM  Result Value Ref Range Status   Specimen Description BLOOD RIGHT FOREARM  Final   Special Requests   Final    BOTTLES DRAWN AEROBIC AND ANAEROBIC Blood Culture results may not be optimal due to an inadequate volume of blood received in culture bottles   Culture   Final    NO GROWTH 5 DAYS Performed at [facility], [address] [place], [place] [address]    Report Status 11/22/2020 FINAL  Final  Culture, blood (routine x 2)     Status: Abnormal   Collection Time: 11/17/20  9:39 AM   Specimen: BLOOD RIGHT HAND  Result Value Ref Range Status   Specimen Description BLOOD RIGHT HAND  Final   Special Requests   Final    BOTTLES DRAWN AEROBIC AND ANAEROBIC Blood Culture results may not be optimal due to an inadequate volume of blood received in culture bottles   Culture  Setup Time   Final    GRAM POSITIVE COCCI IN CLUSTERS ANAEROBIC BOTTLE ONLY Organism ID to follow CRITICAL RESULT CALLED TO, READ BACK BY AND VERIFIED WITHThresa Ross PHARMD, AT 8676 11/20/20 Renato Shin Performed at [facility], [address] [place], [place] [address]    Culture METHICILLIN RESISTANT STAPHYLOCOCCUS AUREUS (A)  Final   Report Status 11/22/2020 FINAL  Final   Organism ID, Bacteria METHICILLIN RESISTANT STAPHYLOCOCCUS AUREUS  Final      Susceptibility   Methicillin resistant staphylococcus aureus - MIC*    CIPROFLOXACIN <=0.5 SENSITIVE  Sensitive     ERYTHROMYCIN >=8 RESISTANT Resistant     GENTAMICIN <=0.5 SENSITIVE Sensitive     OXACILLIN >=4 RESISTANT Resistant     TETRACYCLINE <=1 SENSITIVE Sensitive     VANCOMYCIN 1 SENSITIVE Sensitive     TRIMETH/SULFA <=10 SENSITIVE Sensitive     CLINDAMYCIN <=0.25 SENSITIVE Sensitive     RIFAMPIN <=0.5 SENSITIVE Sensitive     Inducible Clindamycin NEGATIVE Sensitive     * METHICILLIN RESISTANT STAPHYLOCOCCUS AUREUS  Blood Culture ID Panel (Reflexed)     Status: Abnormal   Collection Time: 11/17/20  9:39 AM  Result Value Ref Range Status   Enterococcus faecalis NOT DETECTED NOT DETECTED Final   Enterococcus Faecium NOT DETECTED NOT DETECTED Final   Listeria monocytogenes NOT DETECTED NOT DETECTED Final   Staphylococcus species DETECTED (A) NOT DETECTED Final    Comment: CRITICAL RESULT CALLED TO, READ BACK BY AND VERIFIED WITHThresa Ross PHARMD, AT 1610 11/20/20 D. VANHOOK    Staphylococcus aureus (BCID) DETECTED (A) NOT DETECTED Final    Comment: Methicillin (oxacillin)-resistant Staphylococcus aureus (MRSA). MRSA is predictably resistant to beta-lactam antibiotics (except ceftaroline). Preferred therapy is vancomycin unless clinically contraindicated. Patient requires contact precautions if  hospitalized. CRITICAL RESULT CALLED TO, READ BACK BY AND VERIFIED WITHThresa Ross PHARMD, AT 9604 11/20/20 D. VANHOOK    Staphylococcus epidermidis NOT DETECTED NOT DETECTED Final   Staphylococcus lugdunensis NOT DETECTED NOT DETECTED Final   Streptococcus species NOT DETECTED  NOT DETECTED Final   Streptococcus agalactiae NOT DETECTED NOT DETECTED Final   Streptococcus pneumoniae NOT DETECTED NOT DETECTED Final   Streptococcus pyogenes NOT DETECTED NOT DETECTED Final   A.calcoaceticus-baumannii NOT DETECTED NOT DETECTED Final   Bacteroides fragilis NOT DETECTED NOT DETECTED Final   Enterobacterales NOT DETECTED NOT DETECTED Final   Enterobacter cloacae complex NOT DETECTED NOT DETECTED Final   Escherichia coli NOT DETECTED NOT DETECTED Final   Klebsiella aerogenes NOT DETECTED NOT DETECTED Final   Klebsiella oxytoca NOT DETECTED NOT DETECTED Final   Klebsiella pneumoniae NOT DETECTED NOT DETECTED Final   Proteus species NOT DETECTED NOT DETECTED Final   Salmonella species NOT DETECTED NOT DETECTED Final   Serratia marcescens NOT DETECTED NOT DETECTED Final   Haemophilus influenzae NOT DETECTED NOT DETECTED Final   Neisseria meningitidis NOT DETECTED NOT DETECTED Final   Pseudomonas aeruginosa NOT DETECTED NOT DETECTED Final   Stenotrophomonas maltophilia NOT DETECTED NOT DETECTED Final   Candida albicans NOT DETECTED NOT DETECTED Final   Candida auris NOT DETECTED NOT DETECTED Final   Candida glabrata NOT DETECTED NOT DETECTED Final   Candida krusei NOT DETECTED NOT DETECTED Final   Candida parapsilosis NOT DETECTED NOT DETECTED Final   Candida tropicalis NOT DETECTED NOT DETECTED Final   Cryptococcus neoformans/gattii NOT DETECTED NOT DETECTED Final   Meth resistant mecA/C and MREJ DETECTED (A) NOT DETECTED Final    Comment: CRITICAL RESULT CALLED TO, READ BACK BY AND VERIFIED WITHThresa Ross PHARMD, AT 5409 11/20/20 Renato Shin Performed at [facility], [address], [place], [place] [address]   MRSA PCR Screening     Status: Abnormal   Collection Time: 11/17/20 11:38 AM   Specimen: Nasopharyngeal  Result Value Ref Range Status   MRSA by PCR POSITIVE (A) NEGATIVE Final    Comment:        The GeneXpert MRSA Assay (FDA approved for NASAL  specimens only), is one component of a comprehensive MRSA colonization surveillance program. It  is not intended to diagnose MRSA infection nor to guide or monitor treatment for MRSA infections. RESULT CALLED TO, READ BACK BY AND VERIFIED WITH: R ROBERTS RN 1400 11/17/20 A BROWNING Performed at [facility], [address] [place], [place] [address]   Body fluid culture w Gram Stain     Status: None   Collection Time: 11/17/20  4:05 PM   Specimen: Synovium; Body Fluid  Result Value Ref Range Status   Specimen Description SYNOVIAL RIGHT SHOULDER  Final   Special Requests NONE  Final   Gram Stain   Final    ABUNDANT WBC PRESENT, PREDOMINANTLY PMN NO ORGANISMS SEEN    Culture   Final    RARE METHICILLIN RESISTANT STAPHYLOCOCCUS AUREUS CRITICAL RESULT CALLED TO, READ BACK BY AND VERIFIED WITH: RN T.SHELTON AT 1211 ON 11/19/2020 BY T.SAAD Performed at [facility], [address] [place], [place] [address]    Report Status 11/21/2020 FINAL  Final   Organism ID, Bacteria METHICILLIN RESISTANT STAPHYLOCOCCUS AUREUS  Final      Susceptibility   Methicillin resistant staphylococcus aureus - MIC*    CIPROFLOXACIN <=0.5 SENSITIVE Sensitive     ERYTHROMYCIN >=8 RESISTANT Resistant     GENTAMICIN <=0.5 SENSITIVE Sensitive     OXACILLIN >=4 RESISTANT Resistant     TETRACYCLINE <=1 SENSITIVE Sensitive     VANCOMYCIN <=0.5 SENSITIVE Sensitive     TRIMETH/SULFA <=10 SENSITIVE Sensitive     CLINDAMYCIN <=0.25 SENSITIVE Sensitive     RIFAMPIN <=0.5 SENSITIVE Sensitive     Inducible Clindamycin NEGATIVE Sensitive     * RARE METHICILLIN RESISTANT STAPHYLOCOCCUS AUREUS  Culture, blood (Routine X 2) w Reflex to ID Panel     Status: None   Collection Time: 11/21/20  5:26 AM   Specimen: BLOOD RIGHT HAND  Result Value Ref Range Status   Specimen Description BLOOD RIGHT HAND  Final   Special Requests   Final    BOTTLES DRAWN AEROBIC ONLY Blood Culture adequate volume   Culture    Final    NO GROWTH 5 DAYS Performed at [facility], [address] [place], [place] [address]    Report Status 11/26/2020 FINAL  Final  Culture, blood (Routine X 2) w Reflex to ID Panel     Status: None   Collection Time: 11/21/20  5:26 AM   Specimen: BLOOD LEFT HAND  Result Value Ref Range Status   Specimen Description BLOOD LEFT HAND  Final   Special Requests   Final    BOTTLES DRAWN AEROBIC ONLY Blood Culture adequate volume   Culture   Final    NO GROWTH 5 DAYS Performed at [facility], [address] [place], [place] [address]    Report Status 11/26/2020 FINAL  Final    Pertinent Lab. CBC Latest Ref Rng & Units 11/26/2020 11/25/2020 11/25/2020  WBC 4.0 - 10.5 K/uL 9.6 - 9.7  Hemoglobin 12.0 - 15.0 g/dL 6.6(LL) 8.2(L) 6.7(LL)  Hematocrit 36.0 - 46.0 % 19.3(L) 25.3(L) 20.4(L)  Platelets 150 - 400 K/uL 212 - 205   CMP Latest Ref Rng & Units 11/26/2020 11/25/2020 11/24/2020  Glucose 70 - 99 mg/dL 295(A101(H) 213(Y125(H) 99  BUN 6 - 20 mg/dL 86(V58(H) 78(I68(H) 69(G82(H)  Creatinine 0.44 - 1.00 mg/dL 2.95(M2.81(H) 8.41(L2.79(H) 2.44(W2.75(H)  Sodium 135 - 145 mmol/L 135 132(L) 134(L)  Potassium 3.5 - 5.1 mmol/L 4.6 4.6 5.1  Chloride 98 - 111 mmol/L 104 103 106  CO2 22 -  32 mmol/L 21(L) 19(L) 19(L)  Calcium 8.9 - 10.3 mg/dL 7.6(L) 7.8(L) 7.3(L)  Total Protein 6.5 - 8.1 g/dL 6.6 - -  Total Bilirubin 0.3 - 1.2 mg/dL 0.9 - -  Alkaline Phos 38 - 126 U/L 53 - -  AST 15 - 41 U/L 18 - -  ALT 0 - 44 U/L 19 - -     Pertinent Imaging today Plain films and CT images have been personally visualized and interpreted; radiology reports have been reviewed. Decision making incorporated into the Impression / Recommendations.  I have spent approx 30 minutes for this patient encounter including review of prior medical records with greater than 50% of time being face to face and coordination of their care.  Electronically signed by:   Odette Fraction, MD Infectious Disease Physician [facility]  for Infectious Disease Pager: [phone]

## 2020-11-26 NOTE — Progress Notes (Signed)
  Speech Language Pathology Treatment: Dysphagia  Patient Details Name: Anne Bautista MRN: [id number] DOB: [birth year] Today's Date: 11/26/2020 Time: 7824-2353 SLP Time Calculation (min) (ACUTE ONLY): 20 min  Assessment / Plan / Recommendation Clinical Impression  Pt noted with baseline cough upon entering room that persisted throughout all PO trials; however, MBS revealed appropriate airway protection of all consistencies. No overt s/s of aspiration were noted with thin liquids or purees. Pt noted to have prolonged mastication of solids and a cough that appeared stronger and more concerning with upgraded trial of DYS 2 solids. Recommend DYS 1 diet and thin liquids for more nutritional support and will continue to follow up for potential diet upgrade pending pt's mentation.    HPI HPI: Pt is a 35 y.o. admitted 2/14 with sepsis due to MRSA bacteremia in setting of IVDU. Rt shoulder pain s/p aspiration 2/14. PMHx: IVDU, malnutrition, poor dentition. Toxicology positive for cocaine. CXR 2/17: Stable bilateral rounded nodular densities are noted throughout both  lungs concerning for septic emboli or possibly metastatic disease.  Increased left basilar opacity is noted concerning for worsening  pneumonia with possible associated pleural effusion.  CT head Air-fluid level in the right maxillary sinus suggesting  rhinosinusitis. CXR 2/10: Slightly increased bilateral LOWER lung consolidation/atelectasis  and trace bilateral pleural effusions.      SLP Plan  Continue with current plan of care       Recommendations  Diet recommendations: Dysphagia 1 (puree);Thin liquid Liquids provided via: Cup;Straw Medication Administration: Crushed with puree Supervision: Staff to assist with self feeding;Full supervision/cueing for compensatory strategies Compensations: Slow rate;Small sips/bites Postural Changes and/or Swallow Maneuvers: Seated upright 90 degrees                Oral Care Recommendations: Oral  care BID Follow up Recommendations: Skilled Nursing facility;24 hour supervision/assistance SLP Visit Diagnosis: Dysphagia, unspecified (R13.10) Plan: Continue with current plan of care       GO                Zettie Cooley., SLP Student 11/26/2020, 10:52 AM

## 2020-11-26 NOTE — Progress Notes (Signed)
Pharmacy Antibiotic Note  Anne Bautista is a 35 y.o. 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  Pharmacy has been consulted for Daptomycin dosing.  Also on Doxycycline. SCr stable at ~2.8 (CrCl ~31), last CK was wnl on 2/20. Recent blood cultures negative.  Plan: Continue Daptomycin 10mg /kg (827 mg) IV every 24 hours.  Continue monitoring CK weekly Watch CrCl closely for need to adjust to q48h   Height: 5\' 7"  (170.2 cm) Weight: 82.7 kg (182 lb 5.1 oz) IBW/kg (Calculated) : 61.6  Temp (24hrs), Avg:98.4 F (36.9 C), Min:98.1 F (36.7 C), Max:99 F (37.2 C)  Recent Labs  Lab 11/21/20 0526 11/21/20 0607 11/21/20 1500 11/22/20 1400 11/23/20 0217 11/24/20 0355 11/25/20 0534 11/26/20 0418  WBC  --  9.6  --   --  11.8* 10.4 9.7 9.6  CREATININE 2.69*  --    < > 2.76* 2.73* 2.75* 2.79* 2.81*  LATICACIDVEN 0.8  --   --   --   --   --   --   --    < > = values in this interval not displayed.    Estimated Creatinine Clearance: 30.9 mL/min (A) (by C-G formula based on SCr of 2.81 mg/dL (H)).    No Known Allergies  Antimicrobials this admission: Cefepime 2/12>>2/13 Vancomycin 2/12>>2/16 Dapto 2/16>> Doxy 2/16>>2/20  Microbiology results: 2/12Bcx: 3/3 MRSA  2/12 COVIDPCR:neg 2/12 UCx: >100K MRSA 2/14 Bcx: 1/4 MRSA 2/14 R shoulder fluid: MRSA 2/18 Bcx: negative  Thank you for allowing pharmacy to be a part of this patient's care.   3/14, PharmD, BCPS, [facility] Clinical Pharmacist [phone] Please check AMION for all [facility] Pharmacy numbers 11/26/2020

## 2020-11-27 LAB — CBC WITH DIFFERENTIAL/PLATELET
Abs Immature Granulocytes: 0.1 10*3/uL — ABNORMAL HIGH (ref 0.00–0.07)
Basophils Absolute: 0.1 10*3/uL (ref 0.0–0.1)
Basophils Relative: 0 %
Eosinophils Absolute: 0 10*3/uL (ref 0.0–0.5)
Eosinophils Relative: 0 %
HCT: 26.9 % — ABNORMAL LOW (ref 36.0–46.0)
Hemoglobin: 8.6 g/dL — ABNORMAL LOW (ref 12.0–15.0)
Immature Granulocytes: 1 %
Lymphocytes Relative: 9 %
Lymphs Abs: 1.3 10*3/uL (ref 0.7–4.0)
MCH: 26.3 pg (ref 26.0–34.0)
MCHC: 32 g/dL (ref 30.0–36.0)
MCV: 82.3 fL (ref 80.0–100.0)
Monocytes Absolute: 0.9 10*3/uL (ref 0.1–1.0)
Monocytes Relative: 6 %
Neutro Abs: 11.4 10*3/uL — ABNORMAL HIGH (ref 1.7–7.7)
Neutrophils Relative %: 84 %
Platelets: 220 10*3/uL (ref 150–400)
RBC: 3.27 MIL/uL — ABNORMAL LOW (ref 3.87–5.11)
RDW: 18.3 % — ABNORMAL HIGH (ref 11.5–15.5)
WBC: 13.7 10*3/uL — ABNORMAL HIGH (ref 4.0–10.5)
nRBC: 0 % (ref 0.0–0.2)

## 2020-11-27 LAB — MAGNESIUM: Magnesium: 2 mg/dL (ref 1.7–2.4)

## 2020-11-27 LAB — BRAIN NATRIURETIC PEPTIDE: B Natriuretic Peptide: 670.1 pg/mL — ABNORMAL HIGH (ref 0.0–100.0)

## 2020-11-27 LAB — COMPREHENSIVE METABOLIC PANEL
ALT: 18 U/L (ref 0–44)
AST: 21 U/L (ref 15–41)
Albumin: 1.5 g/dL — ABNORMAL LOW (ref 3.5–5.0)
Alkaline Phosphatase: 70 U/L (ref 38–126)
Anion gap: 11 (ref 5–15)
BUN: 49 mg/dL — ABNORMAL HIGH (ref 6–20)
CO2: 21 mmol/L — ABNORMAL LOW (ref 22–32)
Calcium: 8.2 mg/dL — ABNORMAL LOW (ref 8.9–10.3)
Chloride: 104 mmol/L (ref 98–111)
Creatinine, Ser: 2.59 mg/dL — ABNORMAL HIGH (ref 0.44–1.00)
GFR, Estimated: 24 mL/min — ABNORMAL LOW (ref >60)
Glucose, Bld: 132 mg/dL — ABNORMAL HIGH (ref 70–99)
Potassium: 4.7 mmol/L (ref 3.5–5.1)
Sodium: 136 mmol/L (ref 135–145)
Total Bilirubin: 0.5 mg/dL (ref 0.3–1.2)
Total Protein: 7.4 g/dL (ref 6.5–8.1)

## 2020-11-27 LAB — GLUCOSE, CAPILLARY
Glucose-Capillary: 119 mg/dL — ABNORMAL HIGH (ref 70–99)
Glucose-Capillary: 124 mg/dL — ABNORMAL HIGH (ref 70–99)
Glucose-Capillary: 139 mg/dL — ABNORMAL HIGH (ref 70–99)
Glucose-Capillary: 123 mg/dL — ABNORMAL HIGH (ref 70–99)
Glucose-Capillary: 107 mg/dL — ABNORMAL HIGH (ref 70–99)

## 2020-11-27 LAB — TYPE AND SCREEN
ABO/RH(D): B POS
Antibody Screen: NEGATIVE
Unit division: 0
Unit division: 0

## 2020-11-27 LAB — BPAM RBC
Blood Product Expiration Date: 202202252359
Blood Product Expiration Date: 202203112359
ISSUE DATE / TIME: 202202220940
ISSUE DATE / TIME: 202202230452
Unit Type and Rh: 1700
Unit Type and Rh: 7300

## 2020-11-27 LAB — C-REACTIVE PROTEIN: CRP: 21.5 mg/dL — ABNORMAL HIGH (ref <1.0)

## 2020-11-27 LAB — PHOSPHORUS
Phosphorus: 6.2 mg/dL — ABNORMAL HIGH (ref 2.5–4.6)
Phosphorus: 5.5 mg/dL — ABNORMAL HIGH (ref 2.5–4.6)

## 2020-11-27 LAB — PROCALCITONIN: Procalcitonin: 0.64 ng/mL

## 2020-11-27 MED ORDER — AMLODIPINE BESYLATE 5 MG PO TABS
5.0000 mg | ORAL_TABLET | Freq: Every day | ORAL | Status: DC
  Administered 2020-11-27: 5 mg via ORAL
  Filled 2020-11-27: qty 1

## 2020-11-27 NOTE — Plan of Care (Signed)
  Problem: Fluid Volume: Goal: Hemodynamic stability will improve Outcome: Progressing   

## 2020-11-27 NOTE — Progress Notes (Signed)
SLP Cancellation Note  Patient Details Name: Anne Bautista MRN: [id number] DOB: [birth year]   Cancelled treatment:       Reason Eval/Treat Not Completed: Other (comment) Pt was not available as she was receiving nursing care. RN did share that the pt ate some of the purees yesterday; no overt concern noted. Will continue to follow as able.   Mahala Menghini., M.A. CCC-SLP Acute Rehabilitation Services Pager [phone] Office [phone]  11/27/2020, 9:27 AM

## 2020-11-27 NOTE — Progress Notes (Signed)
Called the floor to get report.  The patient is still refusing MRI after multiple attempts even with meds.

## 2020-11-27 NOTE — Progress Notes (Signed)
Nephrology Follow-Up Consult note   Assessment/Recommendations: Anne Bautista is a/an 34 y.o. female with a past medical history significant for IVDU, tobacco use disorder, malnutrition who present w/ likely endocarditis with sepsis  Non-Oliguric AKI: More likely a mix of ATN from sepsis and septic embolization as well as contrast associated injury.Creatinine on arrival 1.3 improved to 0.8.  Urinalysis with minimal proteinuria and no significant hematuria making infection associated GN less likely.   - has plateaued and now improving. continue supportive care   Sepsis/MRSA bacteremia/endocarditis: Antibiotics and further management per primary team and infectious disease.    HTN - improved.     Anasarca: Related to volume overload, acute illness, hypoalbuminemia.  improving  Hyperkalemia: improved    Anemia: Multifactorial with kidney disease felt to be contributing less of a factor. PRBC for Hb 7 or less. PRBC on 2/22 and 2/23.  Improved on 2/24.   Hypoxic respiratory failure: multifocal and cavitary PNA. on supplemental oxygen  Severe malnutrition: optimize nutrition per primary team   Hyponatremia: resolving  Hyperphosphatemia - improving - renal diet when able - limited to dysphagia diet currently.   Thankfully AKI improving.  Nephrology will sign off.  Please do not hesitate to contact us with any questions.  She will need to follow-up with her PCP after discharge and if renal failure persistent will need referral to nephrology.  I have discussed with her the importance of IV drug cessation.   _____________________________________________________  Interval History/Subjective:  She had 1.7 liters UOP over 2/23.  She's slept a little better this morning and wasn't moaning on arrival.  She is glad to hear kidneys a little better and we discussed need for IVDU cessation  Review of systems:   Denies n/v no diarrhea Reports shortness of breath a little better Generalized  discomfort about the same Denies dark stools or blood per rectum   Medications:  Current Facility-Administered Medications  Medication Dose Route Frequency Provider Last Rate Last Admin  . 0.9 %  sodium chloride infusion (Manually program via Guardrails IV Fluids)   Intravenous Once Eduard Clos, MD      . 0.9 %  sodium chloride infusion (Manually program via Guardrails IV Fluids)   Intravenous Once Eduard Clos, MD      . 0.9 %  sodium chloride infusion   Intravenous PRN Steffanie Dunn, DO   Stopped at 11/23/20 2116  . acetaminophen (TYLENOL) tablet 650 mg  650 mg Oral Q6H PRN Jonah Blue, MD   650 mg at 11/25/20 4970   Or  . acetaminophen (TYLENOL) suppository 650 mg  650 mg Rectal Q6H PRN Jonah Blue, MD      . bisacodyl (DULCOLAX) suppository 10 mg  10 mg Rectal Daily PRN Dellia Cloud, MD      . chlorhexidine (PERIDEX) 0.12 % solution 15 mL  15 mL Mouth Rinse BID Karie Fetch P, DO   15 mL at 11/26/20 2219  . Chlorhexidine Gluconate Cloth 2 % PADS 6 each  6 each Topical Daily Kirtland Bouchard, MD   6 each at 11/26/20 1000  . DAPTOmycin (CUBICIN) 827 mg in sodium chloride 0.9 % IVPB  10 mg/kg Intravenous Q2000 Earnie Larsson, [facility]   Stopped at 11/26/20 2115  . docusate sodium (COLACE) capsule 100 mg  100 mg Oral BID Max Fickle B, MD   100 mg at 11/26/20 2212  . feeding supplement (ENSURE ENLIVE / ENSURE PLUS) liquid 237 mL  237 mL Oral TID BM Karie Fetch  P, DO   237 mL at 11/26/20 1000  . feeding supplement (OSMOLITE 1.5 CAL) liquid 1,000 mL  1,000 mL Per Tube Continuous Darlin Drop, DO 55 mL/hr at 11/26/20 1855 1,000 mL at 11/26/20 1855  . feeding supplement (PROSource TF) liquid 45 mL  45 mL Per Tube TID Dow Adolph N, DO   45 mL at 11/26/20 2200  . folic acid (FOLVITE) tablet 1 mg  1 mg Oral Daily Jonah Blue, MD   1 mg at 11/26/20 6811  . heparin injection 5,000 Units  5,000 Units Subcutaneous Q8H Leslye Peer, MD   5,000 Units at 11/26/20  2221  . HYDROmorphone (DILAUDID) injection 1 mg  1 mg Intravenous Q4H PRN Leslye Peer, MD   1 mg at 11/27/20 0247  . levalbuterol (XOPENEX) nebulizer solution 0.63 mg  0.63 mg Nebulization Q6H PRN Leslye Peer, MD   0.63 mg at 11/26/20 0839  . LORazepam (ATIVAN) injection 0.5-1 mg  0.5-1 mg Intravenous Q6H PRN Lupita Leash, MD   1 mg at 11/27/20 0430  . MEDLINE mouth rinse  15 mL Mouth Rinse q12n4p Brieann, Osinski P, DO   15 mL at 11/26/20 1640  . methadone (DOLOPHINE) tablet 10 mg  10 mg Oral Q8H Leslye Peer, MD   10 mg at 11/26/20 2213  . metoprolol tartrate (LOPRESSOR) injection 2.5 mg  2.5 mg Intravenous Q6H PRN Regalado, Belkys A, MD   2.5 mg at 11/26/20 2220  . multivitamin with minerals tablet 1 tablet  1 tablet Oral Daily Odette Fraction, MD   1 tablet at 11/26/20 1638  . nicotine (NICODERM CQ - dosed in mg/24 hours) patch 14 mg  14 mg Transdermal Daily PRN Jonah Blue, MD      . oxyCODONE (Oxy IR/ROXICODONE) immediate release tablet 5 mg  5 mg Oral Q6H PRN Leslye Peer, MD   5 mg at 11/27/20 0142  . pantoprazole (PROTONIX) EC tablet 40 mg  40 mg Oral Daily Leroy Sea, MD   40 mg at 11/26/20 5726  . polyethylene glycol (MIRALAX / GLYCOLAX) packet 17 g  17 g Oral BID Max Fickle B, MD   17 g at 11/26/20 2221  . QUEtiapine (SEROQUEL) tablet 25 mg  25 mg Oral BID Dellia Cloud, MD   25 mg at 11/26/20 2213  . senna (SENOKOT) tablet 17.2 mg  2 tablet Oral Daily Dellia Cloud, MD   17.2 mg at 11/24/20 0900  . sodium chloride flush (NS) 0.9 % injection 10-40 mL  10-40 mL Intracatheter Q12H Leslye Peer, MD   10 mL at 11/26/20 2222  . sodium chloride flush (NS) 0.9 % injection 3 mL  3 mL Intravenous Q12H Jonah Blue, MD   3 mL at 11/26/20 2220  . thiamine tablet 100 mg  100 mg Oral Daily Jonah Blue, MD   100 mg at 11/26/20 1121    Physical Exam: Vitals:   11/27/20 0400 11/27/20 0500  BP: (!) 143/87 134/87  Pulse: (!) 119 (!) 117  Resp: (!) 22 (!)  24  Temp:    SpO2:     Total I/O In: 351 [IV Piggyback:351] Out: 850 [Urine:850]  Intake/Output Summary (Last 24 hours) at 11/27/2020 0640 Last data filed at 11/27/2020 2035 Gross per 24 hour  Intake 992 ml  Output 1650 ml  Net -658 ml   General adult female in bed    HEENT normocephalic atraumatic extraocular movements intact sclera anicteric Neck supple trachea midline  Lungs reduced breath sounds unlabored at rest Heart tachycardic S1S2 no rub Abdomen soft nontender nondistended Extremities no pitting edema  Psych mood and affect calmer today - not moaning on arrival  Neuro awakens to voice and speech is difficult to understand as she moans her answers but she answers questions   Test Results I personally reviewed new and old clinical labs and radiology tests Lab Results  Component Value Date   NA 136 11/27/2020   K 4.7 11/27/2020   CL 104 11/27/2020   CO2 21 (L) 11/27/2020   BUN 49 (H) 11/27/2020   CREATININE 2.59 (H) 11/27/2020   CALCIUM 8.2 (L) 11/27/2020   ALBUMIN 1.5 (L) 11/27/2020   PHOS 6.2 (H) 11/27/2020    Estanislado Emms, MD 11/27/2020 6:40 AM

## 2020-11-27 NOTE — Progress Notes (Signed)
Physical Therapy Treatment Patient Details Name: Anne Bautista MRN: [id number] DOB: 04/28/1986 Today's Date: 11/27/2020    History of Present Illness 35 yo admitted 2/14 with sepsis due to MRSA bacteremia in setting of IVDU. Rt shoulder pain s/p aspiration 2/14. PMHx: IVDU, malnutrition, poor dentition    PT Comments    Pt is making poor progress towards goals due to increased pain. Pt moaning with minimal verbalization through session. Pt requires increased time and encouragement for all mobility however ultimately is only min A for bed mobility, transfers and ambulation of 4 feet with RW. D/c plans remain appropriate at this time. PT will continue to follow acutely.     Follow Up Recommendations  SNF;Supervision for mobility/OOB     Equipment Recommendations  Wheelchair (measurements PT);3in1 (PT)    Recommendations for Other Services OT consult     Precautions / Restrictions Precautions Precautions: Fall Precaution Comments: right shoulder, left ankle, back pain, watch sats/RR, bil UE edema    Mobility  Bed Mobility Overal bed mobility: Needs Assistance Bed Mobility: Supine to Sit     Supine to sit: Mod assist;HOB elevated     General bed mobility comments: increased time and effort to come to EoB, maximal encouragement and one step commands for sequencing, mod A for bringing trunk to upright, good scooting to EoB today    Transfers Overall transfer level: Needs assistance Equipment used: Rolling walker (2 wheeled) Transfers: Sit to/from [employer] Sit to Stand: From elevated surface;Min assist         General transfer comment: min A for power up to RW, increased pain and moaning  Ambulation/Gait Ambulation/Gait assistance: Min assist Gait Distance (Feet): 3 Feet Assistive device: Rolling walker (2 wheeled) Gait Pattern/deviations: Step-to pattern;Decreased step length - right;Decreased step length - left;Shuffle;Trunk flexed Gait velocity:  slowed Gait velocity interpretation: <1.31 ft/sec, indicative of household ambulator General Gait Details: min A for steadying with RW, cues for sequencing particularly with backing up to recliner       Balance Overall balance assessment: Needs assistance Sitting-balance support: Bilateral upper extremity supported;Feet supported Sitting balance-Leahy Scale: Fair Sitting balance - Comments: minguard for static sitting balance   Standing balance support: Bilateral upper extremity supported Standing balance-Leahy Scale: Poor Standing balance comment: reliant on trunk and UE support                            Cognition Arousal/Alertness: Lethargic Behavior During Therapy: Flat affect (whimpering throughout session, minimally conversive) Overall Cognitive Status: Impaired/Different from baseline Area of Impairment: Attention;Following commands;Safety/judgement;Awareness;Problem solving                 Orientation Level: Disoriented to;Situation Current Attention Level: Sustained Memory: Decreased short-term memory Following Commands: Follows one step commands with increased time Safety/Judgement: Decreased awareness of safety;Decreased awareness of deficits Awareness: Intellectual Problem Solving: Slow processing;Decreased initiation;Difficulty sequencing;Requires verbal cues;Requires tactile cues General Comments: pt with limited verbalization, moaning and whimpering throughout session, requiring maximal encouragement to participate             Pertinent Vitals/Pain Pain Assessment: Faces Faces Pain Scale: Hurts whole lot Pain Location: generalized "it hurts everywhere" Pain Descriptors / Indicators: Aching;Guarding;Moaning;Crying;Discomfort;Constant Pain Intervention(s): Monitored during session;Repositioned;Limited activity within patient's tolerance           PT Goals (current goals can now be found in the care plan section) Acute Rehab PT  Goals Patient Stated Goal: none stated today PT Goal Formulation: With  patient Time For Goal Achievement: 12/02/20 Potential to Achieve Goals: Fair Progress towards PT goals: Progressing toward goals    Frequency    Min 3X/week      PT Plan Current plan remains appropriate       AM-PAC PT "6 Clicks" Mobility   Outcome Measure  Help needed turning from your back to your side while in a flat bed without using bedrails?: A Little Help needed moving from lying on your back to sitting on the side of a flat bed without using bedrails?: A Little Help needed moving to and from a bed to a chair (including a wheelchair)?: A Lot Help needed standing up from a chair using your arms (e.g., wheelchair or bedside chair)?: A Lot Help needed to walk in hospital room?: Total Help needed climbing 3-5 steps with a railing? : Total 6 Click Score: 12    End of Session Equipment Utilized During Treatment: Gait belt;Oxygen Activity Tolerance: Patient limited by pain Patient left: with call bell/phone within reach;in chair;with chair alarm set Nurse Communication: Mobility status;Precautions PT Visit Diagnosis: Other abnormalities of gait and mobility (R26.89);Difficulty in walking, not elsewhere classified (R26.2);Pain;Muscle weakness (generalized) (M62.81) Pain - Right/Left: Left Pain - part of body: Ankle and joints of foot     Time: 1435-1511 PT Time Calculation (min) (ACUTE ONLY): 36 min  Charges:  $Therapeutic Activity: 23-37 mins                     Ansley Mangiapane B. Beverely Risen PT, DPT Acute Rehabilitation Services Pager [phone] Office [phone]    [facility] 11/27/2020, 4:42 PM

## 2020-11-27 NOTE — Progress Notes (Addendum)
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  She was admitted by critical care, she was seen by ID, cardiothoracic surgery, orthopedics.  She was transferred to hospitalist service on 11/25/2020 on day 9 of hospital stay.  11/27/20: Mainly complains of pain which is being addressed.  Advised to get out of bed and into the chair and to mobilize as tolerated.  Assessment/Plan: Principal Problem:   Severe sepsis due to methicillin resistant Staphylococcus aureus (MRSA) with acute organ dysfunction (HCC) Active Problems:   IV drug abuse (HCC)   Poor dentition   Malnutrition (HCC)   Thrombocytopenia (HCC)   Tobacco dependence   Acute cystitis without hematuria   Malnutrition of moderate degree   Sepsis secondary to MRSA bacteremia, POA Infectious tricuspid valve endocarditis Septic emboli to both lungs, kidneys, right shoulder, possible left ankle. Seen by infectious disease. She is currently on IV daptomycin. Seen by cardiothoracic surgery, PCCM and orthopedics. She underwent a right shoulder aspiration showing staph aureus as well. She has been counseled on the importance of IV drug abuse cessation. She had a CT head that was done on 11/15/2020 which showed normal appearance of the brain itself.  She had air-fluid level in the right maxillary sinus suggesting rhinosinusitis.  Nonoliguric AKI likely multifactorial, ATN and prerenal Baseline creatinine appears to be 0.8 with GFR  greater than sixty Creatinine is downtrending 2.5 from 2.8. Continue to avoid nephrotoxins. Continue to monitor urine output BMP in the morning.  Essential hypertension BP is not at goal Add Norvasc 5 mg daily. Continue to monitor vital signs.  Acute hypoxic respiratory failure likely multifactorial secondary bilateral septic pulmonary infarcts and symptomatic anemia. Not on oxygen supplementation at baseline Currently on 2 L O2 saturation 95%.  Acute blood loss anemia/iron deficiency anemia She has received 1 unit PRBC on 11/25/2020 and 1 unit on 11/26/2020. Hemoglobin uptrending 8.6 from 6.6. No evidence of active bleeding. Continue to monitor H&H  GERD Continue PPI  Chronic constipation Continue bowel regimen  Moderate to severe protein calorie malnutrition Albumin 1.4 Moderate to severe muscle mass loss. Encourage increase in oral protein calorie intake.    Code Status: Full code.  Family Communication: None at bedside.  Disposition Plan: Likely will DC to home when clinically cleared.   Consultants:  Infectious disease  PCCM  Nephrology  Orthopedic surgery  Cardiothoracic surgery.  Procedures:  2D echo, bilateral lower extremity Doppler ultrasound.  Right shoulder joint aspiration.  CT head.  Antimicrobials:  IV daptomycin.  DVT prophylaxis: SQ heparin 3 times daily.  Status is: Inpatient    Dispo: The patient is from: Home.              Anticipated d/c is to: Home.              Anticipated d/c date is: 12/02/20              Patient currently no stable for discharge.   Difficult to place patient not applicable.        Objective:  Vitals:   11/27/20 0500 11/27/20 0600 11/27/20 0853 11/27/20 1137  BP: 134/87 127/68 (!) 149/108 (!) 145/97  Pulse: (!) 117  (!) 121 (!) 116  Resp: (!) 24 (!) 25 (!) 23 (!) 29  Temp:   98.2 F (36.8 C) 98.6 F (37 C)  TempSrc:   Oral Oral  SpO2:      Weight:      Height:        Intake/Output  Summary (Last 24 hours) at 11/27/2020 1535 Last data filed at 11/27/2020 1200 Gross per 24 hour  Intake 721 ml  Output 850 ml  Net -129 ml   Filed Weights   11/17/20 0515 11/17/20 1100 11/24/20 0330  Weight: 78 kg 77 kg 82.7 kg    Exam:  . General: 35 y.o. year-old female well-developed well-nourished in no acute distress.  Alert and oriented x3.   . Cardiovascular: RRR no rubs or gallops . Respiratory: Mild rales at bases. No wheezing. Poor inspiratory effort. . Abdomen: Soft bowel sounds present.  . Musculoskeletal: Trace edema in lower extremities bilaterally. . Skin: Left lower extremity rash.  Marland Kitchen Psychiatry: Mood is appropriate for condition setting.   Data Reviewed: CBC: Recent Labs  Lab 11/23/20 0217 11/24/20 0355 11/25/20 0534 11/25/20 1545 11/26/20 0418 11/27/20 0430  WBC 11.8* 10.4 9.7  --  9.6 13.7*  NEUTROABS  --   --   --   --  7.8* 11.4*  HGB 8.2* 7.4* 6.7* 8.2* 6.6* 8.6*  HCT 24.9* 22.9* 20.4* 25.3* 19.3* 26.9*  MCV 81.6 82.4 82.3  --  78.5* 82.3  PLT 175 205 205  --  212 220   Basic Metabolic Panel: Recent Labs  Lab 11/23/20 0217 11/24/20 0355 11/25/20 0534 11/26/20 0418 11/26/20 1902 11/27/20 0430  NA 133* 134* 132* 135  --  136  K 5.0 5.1 4.6 4.6  --  4.7  CL 105 106 103 104  --  104  CO2 17* 19* 19* 21*  --  21*  GLUCOSE 95 99 125* 101*  --  132*  BUN 90* 82* 68* 58*  --  49*  CREATININE 2.73* 2.75* 2.79* 2.81*  --  2.59*  CALCIUM 6.9* 7.3* 7.8* 7.6*  --  8.2*  MG 2.7* 2.6* 2.3 1.9  --  2.0  PHOS 8.7* 7.4* 7.5*  --  5.3* 6.2*   GFR: Estimated Creatinine Clearance: 33.5 mL/min (A) (by C-G formula based on SCr of 2.59 mg/dL (H)). Liver Function Tests: Recent Labs  Lab 11/21/20 0526 11/22/20 1400 11/23/20 0217 11/26/20 0418 11/27/20 0430  AST 18  --   --  18 21  ALT 11  --   --  19 18  ALKPHOS 89  --   --  53 70  BILITOT 1.2  --   --  0.9 0.5  PROT 6.0*  --   --  6.6 7.4  ALBUMIN 1.1* 1.5* 1.4* 1.4* 1.5*   No results for  input(s): LIPASE, AMYLASE in the last 168 hours. No results for input(s): AMMONIA in the last 168 hours. Coagulation Profile: No results for input(s): INR, PROTIME in the last 168 hours. Cardiac Enzymes: Recent Labs  Lab 11/23/20 0217  CKTOTAL 45   BNP (last 3 results) No results for input(s): PROBNP in the last 8760 hours. HbA1C: No results for input(s): HGBA1C in the last 72 hours. CBG: Recent Labs  Lab 11/23/20 2015 11/26/20 2356 11/27/20 0322 11/27/20 0801 11/27/20 1140  GLUCAP 113* 126* 119* 124* 139*  Lipid Profile: No results for input(s): CHOL, HDL, LDLCALC, TRIG, CHOLHDL, LDLDIRECT in the last 72 hours. Thyroid Function Tests: No results for input(s): TSH, T4TOTAL, FREET4, T3FREE, THYROIDAB in the last 72 hours. Anemia Panel: Recent Labs    11/25/20 0817  VITAMINB12 2,498*  FOLATE 11.4  FERRITIN 517*  TIBC 136*  IRON 11*  RETICCTPCT 1.1   Urine analysis:    Component Value Date/Time   COLORURINE YELLOW 11/15/2020 1947   APPEARANCEUR HAZY (A) 11/15/2020 1947   LABSPEC 1.020 11/15/2020 1947   PHURINE 6.0 11/15/2020 1947   GLUCOSEU NEGATIVE 11/15/2020 1947   HGBUR MODERATE (A) 11/15/2020 1947   BILIRUBINUR NEGATIVE 11/15/2020 1947   KETONESUR NEGATIVE 11/15/2020 1947   PROTEINUR 30 (A) 11/15/2020 1947   NITRITE POSITIVE (A) 11/15/2020 1947   LEUKOCYTESUR TRACE (A) 11/15/2020 1947   Sepsis Labs: @LABRCNTIP (procalcitonin:4,lacticidven:4)  ) Recent Results (from the past 240 hour(s))  Body fluid culture w Gram Stain     Status: None   Collection Time: 11/17/20  4:05 PM   Specimen: Synovium; Body Fluid  Result Value Ref Range Status   Specimen Description SYNOVIAL RIGHT SHOULDER  Final   Special Requests NONE  Final   Gram Stain   Final    ABUNDANT WBC PRESENT, PREDOMINANTLY PMN NO ORGANISMS SEEN    Culture   Final    RARE METHICILLIN RESISTANT STAPHYLOCOCCUS AUREUS CRITICAL RESULT CALLED TO, READ BACK BY AND VERIFIED WITH: RN T.SHELTON AT  1211 ON 11/19/2020 BY T.SAAD Performed at [facility], [address], [place], [place]    Report Status 11/21/2020 FINAL  Final   Organism ID, Bacteria METHICILLIN RESISTANT STAPHYLOCOCCUS AUREUS  Final      Susceptibility   Methicillin resistant staphylococcus aureus - MIC*    CIPROFLOXACIN <=0.5 SENSITIVE Sensitive     ERYTHROMYCIN >=8 RESISTANT Resistant     GENTAMICIN <=0.5 SENSITIVE Sensitive     OXACILLIN >=4 RESISTANT Resistant     TETRACYCLINE <=1 SENSITIVE Sensitive     VANCOMYCIN <=0.5 SENSITIVE Sensitive     TRIMETH/SULFA <=10 SENSITIVE Sensitive     CLINDAMYCIN <=0.25 SENSITIVE Sensitive     RIFAMPIN <=0.5 SENSITIVE Sensitive     Inducible Clindamycin NEGATIVE Sensitive     * RARE METHICILLIN RESISTANT STAPHYLOCOCCUS AUREUS  Culture, blood (Routine X 2) w Reflex to ID Panel     Status: None   Collection Time: 11/21/20  5:26 AM   Specimen: BLOOD RIGHT HAND  Result Value Ref Range Status   Specimen Description BLOOD RIGHT HAND  Final   Special Requests   Final    BOTTLES DRAWN AEROBIC ONLY Blood Culture adequate volume   Culture   Final    NO GROWTH 5 DAYS Performed at [facility], [address] [place], [place]    Report Status 11/26/2020 FINAL  Final  Culture, blood (Routine X 2) w Reflex to ID Panel     Status: None   Collection Time: 11/21/20  5:26 AM   Specimen: BLOOD LEFT HAND  Result Value Ref Range Status   Specimen Description BLOOD LEFT HAND  Final   Special Requests   Final    BOTTLES DRAWN AEROBIC ONLY Blood Culture adequate volume   Culture   Final    NO GROWTH 5 DAYS Performed at [facility], [address], [place], [place]    Report Status 11/26/2020 FINAL  Final      Studies: 11/28/2020 COMPLETE JOINT  SPACE STRUCTURES LOW LEFT  Result Date: 11/27/2020 CLINICAL DATA:  Anterior left foot, possible effusion, septic arthritis EXAM: LEFT LOWER EXTREMITY SOFT TISSUE ULTRASOUND COMPLETE TECHNIQUE:  Ultrasound examination was performed including evaluation of the muscles, tendons, joint, and adjacent soft tissues. COMPARISON:  None. FINDINGS: Targeted grayscale and color Doppler ultrasound evaluation of the indicated area of concern along the anterolateral left foot at a site of focal swelling and erythema. There is some mild phlegmonous changes centered in the subcutaneous tissues along the anterolateral left foot. Mild skin thickening is present as well. No focal organized collection is seen. Changes appear confined to the fascial layer superficial to the tendons and musculature. Few imaged joint spaces are free of sizable effusion or fluid distension. IMPRESSION: Mild phlegmonous changes in skin thickening along the anterolateral left foot at a site of focal swelling and erythema. No organized fluid collection. Electronically Signed   By: Kreg ShropshirePrice  DeHay M.D.   On: 11/27/2020 02:01    Scheduled Meds: . sodium chloride   Intravenous Once  . sodium chloride   Intravenous Once  . chlorhexidine  15 mL Mouth Rinse BID  . Chlorhexidine Gluconate Cloth  6 each Topical Daily  . docusate sodium  100 mg Oral BID  . feeding supplement  237 mL Oral TID BM  . feeding supplement (PROSource TF)  45 mL Per Tube TID  . folic acid  1 mg Oral Daily  . heparin  5,000 Units Subcutaneous Q8H  . mouth rinse  15 mL Mouth Rinse q12n4p  . methadone  10 mg Oral Q8H  . multivitamin with minerals  1 tablet Oral Daily  . pantoprazole  40 mg Oral Daily  . polyethylene glycol  17 g Oral BID  . QUEtiapine  25 mg Oral BID  . senna  2 tablet Oral Daily  . sodium chloride flush  10-40 mL Intracatheter Q12H  . sodium chloride flush  3 mL Intravenous Q12H  . thiamine  100 mg Oral Daily    Continuous Infusions: . sodium chloride Stopped (11/23/20 2116)  . DAPTOmycin (CUBICIN)  IV Stopped (11/26/20 2115)  . feeding supplement (OSMOLITE 1.5 CAL) 1,000 mL (11/26/20 1855)     LOS: 11 days     Darlin Droparole N Gerline Ratto, MD [facility] Pager [phone]  If 7PM-7AM, please contact night-coverage [email or web address] Password [facility] 11/27/2020, 3:35 PM

## 2020-11-27 NOTE — Plan of Care (Signed)

## 2020-11-28 LAB — COMPREHENSIVE METABOLIC PANEL
ALT: 17 U/L (ref 0–44)
AST: 21 U/L (ref 15–41)
Albumin: 1.5 g/dL — ABNORMAL LOW (ref 3.5–5.0)
Alkaline Phosphatase: 62 U/L (ref 38–126)
Anion gap: 8 (ref 5–15)
BUN: 43 mg/dL — ABNORMAL HIGH (ref 6–20)
CO2: 22 mmol/L (ref 22–32)
Calcium: 8 mg/dL — ABNORMAL LOW (ref 8.9–10.3)
Chloride: 105 mmol/L (ref 98–111)
Creatinine, Ser: 2.22 mg/dL — ABNORMAL HIGH (ref 0.44–1.00)
GFR, Estimated: 29 mL/min — ABNORMAL LOW (ref >60)
Glucose, Bld: 125 mg/dL — ABNORMAL HIGH (ref 70–99)
Potassium: 4.2 mmol/L (ref 3.5–5.1)
Sodium: 135 mmol/L (ref 135–145)
Total Bilirubin: 0.6 mg/dL (ref 0.3–1.2)
Total Protein: 7.4 g/dL (ref 6.5–8.1)

## 2020-11-28 LAB — CBC WITH DIFFERENTIAL/PLATELET
Abs Immature Granulocytes: 0.1 10*3/uL — ABNORMAL HIGH (ref 0.00–0.07)
Basophils Absolute: 0 10*3/uL (ref 0.0–0.1)
Basophils Relative: 0 %
Eosinophils Absolute: 0.1 10*3/uL (ref 0.0–0.5)
Eosinophils Relative: 0 %
HCT: 25 % — ABNORMAL LOW (ref 36.0–46.0)
Hemoglobin: 8 g/dL — ABNORMAL LOW (ref 12.0–15.0)
Immature Granulocytes: 1 %
Lymphocytes Relative: 10 %
Lymphs Abs: 1.2 10*3/uL (ref 0.7–4.0)
MCH: 26.5 pg (ref 26.0–34.0)
MCHC: 32 g/dL (ref 30.0–36.0)
MCV: 82.8 fL (ref 80.0–100.0)
Monocytes Absolute: 0.8 10*3/uL (ref 0.1–1.0)
Monocytes Relative: 7 %
Neutro Abs: 10.2 10*3/uL — ABNORMAL HIGH (ref 1.7–7.7)
Neutrophils Relative %: 82 %
Platelets: 212 10*3/uL (ref 150–400)
RBC: 3.02 MIL/uL — ABNORMAL LOW (ref 3.87–5.11)
RDW: 18.4 % — ABNORMAL HIGH (ref 11.5–15.5)
WBC: 12.5 10*3/uL — ABNORMAL HIGH (ref 4.0–10.5)
nRBC: 0 % (ref 0.0–0.2)

## 2020-11-28 LAB — GLUCOSE, CAPILLARY
Glucose-Capillary: 110 mg/dL — ABNORMAL HIGH (ref 70–99)
Glucose-Capillary: 112 mg/dL — ABNORMAL HIGH (ref 70–99)
Glucose-Capillary: 113 mg/dL — ABNORMAL HIGH (ref 70–99)
Glucose-Capillary: 114 mg/dL — ABNORMAL HIGH (ref 70–99)
Glucose-Capillary: 124 mg/dL — ABNORMAL HIGH (ref 70–99)
Glucose-Capillary: 126 mg/dL — ABNORMAL HIGH (ref 70–99)
Glucose-Capillary: 132 mg/dL — ABNORMAL HIGH (ref 70–99)

## 2020-11-28 LAB — PROCALCITONIN: Procalcitonin: 0.66 ng/mL

## 2020-11-28 LAB — MAGNESIUM: Magnesium: 1.7 mg/dL (ref 1.7–2.4)

## 2020-11-28 LAB — C-REACTIVE PROTEIN: CRP: 19.4 mg/dL — ABNORMAL HIGH (ref <1.0)

## 2020-11-28 LAB — PHOSPHORUS: Phosphorus: 5.4 mg/dL — ABNORMAL HIGH (ref 2.5–4.6)

## 2020-11-28 LAB — BRAIN NATRIURETIC PEPTIDE: B Natriuretic Peptide: 455.5 pg/mL — ABNORMAL HIGH (ref 0.0–100.0)

## 2020-11-28 MED ORDER — AMLODIPINE BESYLATE 10 MG PO TABS
10.0000 mg | ORAL_TABLET | Freq: Every day | ORAL | Status: DC
  Administered 2020-11-28 – 2020-12-29 (×30): 10 mg via ORAL
  Filled 2020-11-28 (×31): qty 1

## 2020-11-28 MED ORDER — SODIUM CHLORIDE 0.9 % IV SOLN
10.0000 mg/kg | Freq: Every day | INTRAVENOUS | Status: DC
  Administered 2020-11-28 – 2020-12-11 (×14): 764 mg via INTRAVENOUS
  Filled 2020-11-28 (×16): qty 15.28

## 2020-11-28 NOTE — Progress Notes (Signed)
  Speech Language Pathology Treatment: Dysphagia  Patient Details Name: Anne Bautista MRN: [id number] DOB: 1986/07/09 Today's Date: 11/28/2020 Time: 4098-1191 SLP Time Calculation (min) (ACUTE ONLY): 14 min  Assessment / Plan / Recommendation Clinical Impression  No overt s/s of aspiration were noted today with lunch tray; however, pt remains impacted by cognitive status which limited attempts for advanced PO trials of solids. Recommend continuation of DYS 1 and thin liquids given pt's current mentation. SLP will continue to follow up to monitor pt's oropharyngeal swallow with the potential for a diet upgrade as pt's mentation clears.    HPI HPI: Pt is a 35 y.o. admitted 2/14 with sepsis due to MRSA bacteremia in setting of IVDU. Rt shoulder pain s/p aspiration 2/14. PMHx: IVDU, malnutrition, poor dentition. Toxicology positive for cocaine. CXR 2/17: Stable bilateral rounded nodular densities are noted throughout both  lungs concerning for septic emboli or possibly metastatic disease.  Increased left basilar opacity is noted concerning for worsening  pneumonia with possible associated pleural effusion.  CT head Air-fluid level in the right maxillary sinus suggesting  rhinosinusitis. CXR 2/10: Slightly increased bilateral LOWER lung consolidation/atelectasis  and trace bilateral pleural effusions.      SLP Plan  Continue with current plan of care       Recommendations  Diet recommendations: Dysphagia 1 (puree);Thin liquid Liquids provided via: Cup;Straw Medication Administration: Crushed with puree Supervision: Staff to assist with self feeding;Full supervision/cueing for compensatory strategies Compensations: Slow rate;Small sips/bites Postural Changes and/or Swallow Maneuvers: Seated upright 90 degrees                Oral Care Recommendations: Oral care BID Follow up Recommendations: Skilled Nursing facility;24 hour supervision/assistance SLP Visit Diagnosis: Dysphagia, unspecified  (R13.10) Plan: Continue with current plan of care       GO                Zettie Cooley., SLP Student 11/28/2020, 2:20 PM

## 2020-11-28 NOTE — Progress Notes (Signed)
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  She was admitted by critical care, she was seen by ID, cardiothoracic surgery, orthopedics.  She was transferred to hospitalist service on 11/25/2020 on day 9 of hospital stay.  11/28/20: Seen and examined at bedside.  She mainly complains of pain all over.  Pain management in place.  Advised to get out of bed and mobilize as tolerated.  Assessment/Plan: Principal Problem:   Severe sepsis due to methicillin resistant Staphylococcus aureus (MRSA) with acute organ dysfunction (HCC) Active Problems:   IV drug abuse (HCC)   Poor dentition   Malnutrition (HCC)   Thrombocytopenia (HCC)   Tobacco dependence   Acute cystitis without hematuria   Malnutrition of moderate degree   Sepsis secondary to MRSA bacteremia, POA Infectious tricuspid valve endocarditis Septic emboli to both lungs, kidneys, right shoulder, possible left ankle. Seen by infectious disease. She is currently on IV daptomycin. Seen by cardiothoracic surgery, PCCM and orthopedics. She underwent a right shoulder aspiration showing MRSA as well. She has been counseled on the importance of IV drug abuse cessation. She had a CT head that was done on 11/15/2020 which showed normal appearance of the brain itself.  She had air-fluid level in the right maxillary sinus suggesting rhinosinusitis. Continue pain control and bowel regimen  Improving nonoliguric AKI likely multifactorial,  ATN and prerenal Baseline creatinine appears to be 0.8 with GFR greater than sixty Creatinine is downtrending 2.2 from 2.5, from 2.8. Continue to avoid nephrotoxic agents. Last urine output 1.0 L recorded in the last 24 hours. Repeat BMP in the morning.  Essential hypertension BP is not at goal Norvasc dose increased to 10 mg daily. Continue to monitor vital signs.  Acute hypoxic respiratory failure likely multifactorial secondary bilateral septic pulmonary infarcts and symptomatic anemia. Not on oxygen supplementation at baseline Currently on 2 L O2 saturation 95%.  Acute blood loss anemia/iron deficiency anemia She has received 1 unit PRBC on 11/25/2020 and 1 unit on 11/26/2020. Acute drop in hemoglobin 8.0 from 8.6K No evidence of active bleeding Continue to monitor H&H.  GERD Continue PPI  Chronic constipation Continue bowel regimen  Moderate to severe protein calorie malnutrition post core track placement. Albumin 1.4 Moderate to severe muscle mass loss. Encourage increase in oral protein calorie intake. Continue core track feeding tube with aspiration precautions.  Physical debility PT assessed and recommending SNF TOC consulted to assist with SNF placement. Out of bed to chair with every shift Mobilize as tolerated.    Code Status: Full code.  Family Communication: None at bedside.  Disposition Plan: Likely will discharge to SNF once infectious disease signs off.   Consultants:  Infectious disease  PCCM  Nephrology  Orthopedic surgery  Cardiothoracic surgery.  Procedures:  2D echo, bilateral lower extremity Doppler ultrasound.  Right shoulder joint aspiration.  CT head.  Antimicrobials:  IV daptomycin.  DVT prophylaxis: SQ heparin 3 times daily.  Status is: Inpatient    Dispo: The patient is from: Home.  Anticipated d/c is to: Home.              Anticipated d/c date is: 12/02/20              Patient currently no stable  for discharge.   Difficult to place patient not applicable.        Objective: Vitals:   11/28/20 0800 11/28/20 1200 11/28/20 1213 11/28/20 1455  BP: 137/88 134/72  136/68  Pulse: (!) 118 (!) 124  (!) 122  Resp: (!) 25 (!) 26  (!) 27  Temp: 98.6 F (37 C) 98.2 F (36.8 C)  98.3 F (36.8 C)  TempSrc: Oral Oral  Oral  SpO2:   93%   Weight:      Height:        Intake/Output Summary (Last 24 hours) at 11/28/2020 1531 Last data filed at 11/28/2020 1500 Gross per 24 hour  Intake 2614.83 ml  Output 1000 ml  Net 1614.83 ml   Filed Weights   11/17/20 1100 11/24/20 0330 11/28/20 0428  Weight: 77 kg 82.7 kg 76.4 kg    Exam:  . General: 35 y.o. year-old female chronically ill-appearing no acute distress.  Alert and oriented x3.   . Cardiovascular: Regular rate and rhythm no rubs or gallops. Marland Kitchen Respiratory: Mild rales at bases no wheezing noted.  Poor inspiratory effort. . Abdomen: Soft nontender bowel sounds present.   . Musculoskeletal: Trace lower extremity edema bilaterally.   . Skin: Left lower extremity rash, POA . Psychiatry: Mood is appropriate for condition and setting.  Data Reviewed: CBC: Recent Labs  Lab 11/24/20 0355 11/25/20 0534 11/25/20 1545 11/26/20 0418 11/27/20 0430 11/28/20 0505  WBC 10.4 9.7  --  9.6 13.7* 12.5*  NEUTROABS  --   --   --  7.8* 11.4* 10.2*  HGB 7.4* 6.7* 8.2* 6.6* 8.6* 8.0*  HCT 22.9* 20.4* 25.3* 19.3* 26.9* 25.0*  MCV 82.4 82.3  --  78.5* 82.3 82.8  PLT 205 205  --  212 220 212   Basic Metabolic Panel: Recent Labs  Lab 11/24/20 0355 11/25/20 0534 11/26/20 0418 11/26/20 1902 11/27/20 0430 11/27/20 2231 11/28/20 0505  NA 134* 132* 135  --  136  --  135  K 5.1 4.6 4.6  --  4.7  --  4.2  CL 106 103 104  --  104  --  105  CO2 19* 19* 21*  --  21*  --  22  GLUCOSE 99 125* 101*  --  132*  --  125*  BUN 82* 68* 58*  --  49*  --  43*  CREATININE 2.75* 2.79* 2.81*  --  2.59*  --  2.22*  CALCIUM 7.3* 7.8* 7.6*  --  8.2*  --   8.0*  MG 2.6* 2.3 1.9  --  2.0  --  1.7  PHOS 7.4* 7.5*  --  5.3* 6.2* 5.5* 5.4*   GFR: Estimated Creatinine Clearance: 37.7 mL/min (A) (by C-G formula based on SCr of 2.22 mg/dL (H)). Liver Function Tests: Recent Labs  Lab 11/22/20 1400 11/23/20 0217 11/26/20 0418 11/27/20 0430 11/28/20 0505  AST  --   --  18 21 21   ALT  --   --  19 18 17   ALKPHOS  --   --  53 70 62  BILITOT  --   --  0.9 0.5 0.6  PROT  --   --  6.6 7.4 7.4  ALBUMIN 1.5* 1.4* 1.4* 1.5* 1.5*   No results  for input(s): LIPASE, AMYLASE in the last 168 hours. No results for input(s): AMMONIA in the last 168 hours. Coagulation Profile: No results for input(s): INR, PROTIME in the last 168 hours. Cardiac Enzymes: Recent Labs  Lab 11/23/20 0217  CKTOTAL 45   BNP (last 3 results) No results for input(s): PROBNP in the last 8760 hours. HbA1C: No results for input(s): HGBA1C in the last 72 hours. CBG: Recent Labs  Lab 11/27/20 2040 11/28/20 0014 11/28/20 0426 11/28/20 0838 11/28/20 1211  GLUCAP 107* 113* 114* 132* 126*   Lipid Profile: No results for input(s): CHOL, HDL, LDLCALC, TRIG, CHOLHDL, LDLDIRECT in the last 72 hours. Thyroid Function Tests: No results for input(s): TSH, T4TOTAL, FREET4, T3FREE, THYROIDAB in the last 72 hours. Anemia Panel: No results for input(s): VITAMINB12, FOLATE, FERRITIN, TIBC, IRON, RETICCTPCT in the last 72 hours. Urine analysis:    Component Value Date/Time   COLORURINE YELLOW 11/15/2020 1947   APPEARANCEUR HAZY (A) 11/15/2020 1947   LABSPEC 1.020 11/15/2020 1947   PHURINE 6.0 11/15/2020 1947   GLUCOSEU NEGATIVE 11/15/2020 1947   HGBUR MODERATE (A) 11/15/2020 1947   BILIRUBINUR NEGATIVE 11/15/2020 1947   KETONESUR NEGATIVE 11/15/2020 1947   PROTEINUR 30 (A) 11/15/2020 1947   NITRITE POSITIVE (A) 11/15/2020 1947   LEUKOCYTESUR TRACE (A) 11/15/2020 1947   Sepsis Labs: @LABRCNTIP (procalcitonin:4,lacticidven:4)  ) Recent Results (from the past 240 hour(s))   Culture, blood (Routine X 2) w Reflex to ID Panel     Status: None   Collection Time: 11/21/20  5:26 AM   Specimen: BLOOD RIGHT HAND  Result Value Ref Range Status   Specimen Description BLOOD RIGHT HAND  Final   Special Requests   Final    BOTTLES DRAWN AEROBIC ONLY Blood Culture adequate volume   Culture   Final    NO GROWTH 5 DAYS Performed at [facility], [address] [place], [place]    Report Status 11/26/2020 FINAL  Final  Culture, blood (Routine X 2) w Reflex to ID Panel     Status: None   Collection Time: 11/21/20  5:26 AM   Specimen: BLOOD LEFT HAND  Result Value Ref Range Status   Specimen Description BLOOD LEFT HAND  Final   Special Requests   Final    BOTTLES DRAWN AEROBIC ONLY Blood Culture adequate volume   Culture   Final    NO GROWTH 5 DAYS Performed at [facility], [address] [place], [place]    Report Status 11/26/2020 FINAL  Final      Studies: No results found.  Scheduled Meds: . sodium chloride   Intravenous Once  . sodium chloride   Intravenous Once  . amLODipine  10 mg Oral Daily  . chlorhexidine  15 mL Mouth Rinse BID  . Chlorhexidine Gluconate Cloth  6 each Topical Daily  . docusate sodium  100 mg Oral BID  . feeding supplement  237 mL Oral TID BM  . feeding supplement (PROSource TF)  45 mL Per Tube TID  . folic acid  1 mg Oral Daily  . heparin  5,000 Units Subcutaneous Q8H  . mouth rinse  15 mL Mouth Rinse q12n4p  . methadone  10 mg Oral Q8H  . multivitamin with minerals  1 tablet Oral Daily  . pantoprazole  40 mg Oral Daily  . polyethylene glycol  17 g Oral BID  . QUEtiapine  25 mg Oral BID  . senna  2 tablet Oral Daily  .  sodium chloride flush  10-40 mL Intracatheter Q12H  . sodium chloride flush  3 mL Intravenous Q12H  . thiamine  100 mg Oral Daily    Continuous Infusions: . sodium chloride Stopped (11/23/20 2116)  . DAPTOmycin (CUBICIN)  IV    . feeding supplement (OSMOLITE 1.5 CAL)  1,000 mL (11/28/20 1409)     LOS: 12 days     Darlin Droparole N Alannie Amodio, MD [facility] Pager [phone]  If 7PM-7AM, please contact night-coverage [email or web address] Password [email or web address] 11/28/2020, 3:31 PM

## 2020-11-28 NOTE — Progress Notes (Signed)
Moaning and crying , when asked if she is in pain, denied. Made comfortable on bed.

## 2020-11-28 NOTE — Progress Notes (Signed)
Would take out pulse ox and O2 [place]  claiming  she feels better without the o2.. Continue to monitor.

## 2020-11-28 NOTE — Plan of Care (Signed)
  Problem: Clinical Measurements: Goal: Signs and symptoms of infection will decrease Outcome: Progressing   Problem: Education: Goal: Knowledge of General Education information will improve Description: Including pain rating scale, medication(s)/side effects and non-pharmacologic comfort measures Outcome: Progressing   Problem: Clinical Measurements: Goal: Diagnostic test results will improve Outcome: Progressing   Problem: Nutrition: Goal: Adequate nutrition will be maintained Outcome: Progressing   Problem: Coping: Goal: Level of anxiety will decrease Outcome: Progressing   Problem: Pain Managment: Goal: General experience of comfort will improve Outcome: Progressing   Problem: Skin Integrity: Goal: Risk for impaired skin integrity will decrease Outcome: Progressing

## 2020-11-28 NOTE — Progress Notes (Signed)
Encouraged to get out of bed, but refused. Explained to her the significance of getting out of bed but still prefers to be on bed. Started moaning when asked to be in a chair.

## 2020-11-28 NOTE — TOC Progression Note (Signed)
Transition of Care [facility]) - Progression Note    Patient Details  Name: Anne Bautista MRN: [id number] Date of Birth: 05/03/1986  Transition of Care [facility]) CM/SW Contact  Leone Haven, RN Phone Number: 11/28/2020, 4:08 PM  Clinical Narrative:     Endocarditis, bacteremia, ivdu , started cortrak tube feeds yesteerday, will be on iv abx for 4 weeks, , has follow up with [facility], will need ast with meds at dc with [facility]. TOC will continue to follow for dc needs.        Expected Discharge Plan and Services                                                 Social Determinants of Health (SDOH) Interventions    Readmission Risk Interventions No flowsheet data found.

## 2020-11-29 LAB — GLUCOSE, CAPILLARY
Glucose-Capillary: 121 mg/dL — ABNORMAL HIGH (ref 70–99)
Glucose-Capillary: 116 mg/dL — ABNORMAL HIGH (ref 70–99)
Glucose-Capillary: 117 mg/dL — ABNORMAL HIGH (ref 70–99)
Glucose-Capillary: 114 mg/dL — ABNORMAL HIGH (ref 70–99)
Glucose-Capillary: 104 mg/dL — ABNORMAL HIGH (ref 70–99)
Glucose-Capillary: 111 mg/dL — ABNORMAL HIGH (ref 70–99)

## 2020-11-29 LAB — CBC
HCT: 24.7 % — ABNORMAL LOW (ref 36.0–46.0)
Hemoglobin: 8.1 g/dL — ABNORMAL LOW (ref 12.0–15.0)
MCH: 27 pg (ref 26.0–34.0)
MCHC: 32.8 g/dL (ref 30.0–36.0)
MCV: 82.3 fL (ref 80.0–100.0)
Platelets: 208 10*3/uL (ref 150–400)
RBC: 3 MIL/uL — ABNORMAL LOW (ref 3.87–5.11)
RDW: 18.5 % — ABNORMAL HIGH (ref 11.5–15.5)
WBC: 10.8 10*3/uL — ABNORMAL HIGH (ref 4.0–10.5)
nRBC: 0 % (ref 0.0–0.2)

## 2020-11-29 LAB — BASIC METABOLIC PANEL
Anion gap: 9 (ref 5–15)
BUN: 42 mg/dL — ABNORMAL HIGH (ref 6–20)
CO2: 23 mmol/L (ref 22–32)
Calcium: 8 mg/dL — ABNORMAL LOW (ref 8.9–10.3)
Chloride: 104 mmol/L (ref 98–111)
Creatinine, Ser: 2.1 mg/dL — ABNORMAL HIGH (ref 0.44–1.00)
GFR, Estimated: 31 mL/min — ABNORMAL LOW (ref >60)
Glucose, Bld: 126 mg/dL — ABNORMAL HIGH (ref 70–99)
Potassium: 4.2 mmol/L (ref 3.5–5.1)
Sodium: 136 mmol/L (ref 135–145)

## 2020-11-29 NOTE — Progress Notes (Signed)
   11/29/20 0746  Assess: MEWS Score  Temp 98.3 F (36.8 C)  BP (!) 145/93  Pulse Rate (!) 110  ECG Heart Rate (!) 101  Resp (!) 21  Level of Consciousness Alert  SpO2 91 %  O2 Device Room Air  Patient Activity (if Appropriate) In bed  Assess: MEWS Score  MEWS Temp 0  MEWS Systolic 0  MEWS Pulse 1  MEWS RR 1  MEWS LOC 0  MEWS Score 2  MEWS Score Color Yellow  Assess: if the MEWS score is Yellow or Red  Were vital signs taken at a resting state? Yes  Focused Assessment No change from prior assessment  Early Detection of Sepsis Score *See Row Information* Medium  MEWS guidelines implemented *See Row Information* No, previously yellow, continue vital signs every 4 hours  Treat  MEWS Interventions Administered prn meds/treatments  Take Vital Signs  Increase Vital Sign Frequency  Yellow: Q 2hr X 2 then Q 4hr X 2, if remains yellow, continue Q 4hrs  Notify: Charge Nurse/RN  Name of Charge Nurse/RN Notified Ben RN  Date Charge Nurse/RN Notified 11/29/20  Time Charge Nurse/RN Notified 0746  Notify: Provider  Provider Name/Title Dr. Sharl Ma  Date Provider Notified 11/29/20  Time Provider Notified 0800  Notification Type Rounds  Notification Reason Other (Comment)  Provider response No new orders  Date of Provider Response 11/29/20  Time of Provider Response 0800  Document  Patient Outcome Not stable and remains on department  Progress note created (see row info) Yes

## 2020-11-29 NOTE — Progress Notes (Signed)
PROGRESS NOTE    Anne Bautista  XLK:[id number] DOB: 01/04/1986 DOA: 11/15/2020 PCP: Patient, No Pcp Per   Brief Narrative:   Patient is a 35 year old female with PMH significant for IV drug use, tobacco abuse, and malnutrition who presented to he ED with a one week history of myalgia and cough.  In the ED she was diagnosed with sepsis and admitted by pulmonary critical care.  On admission, she was diagnosed with bacteremia, tricuspid valve endocarditis and septic emboli to both lungs, kidneys, right shoulder, and possibly left ankle.  ID, cardiothoracic surgery, and orthopedics were consulted.  On day 9 of hospitalization, 11/25/20, she was transferred to hospitalist service. Per ID, recommend treatment of bacteremia and endocarditis with 6 week course of IV daptomycin. Patient has been having issues with pain and has a current pain management system in place.  Assessment & Plan:   Principal Problem:   Severe sepsis due to methicillin resistant Staphylococcus aureus (MRSA) with acute organ dysfunction (HCC) Active Problems:   IV drug abuse (HCC)   Poor dentition   Malnutrition (HCC)   Thrombocytopenia (HCC)   Tobacco dependence   Acute cystitis without hematuria   Malnutrition of moderate degree   Severe sepsis due to methicillin resistant Staphylococcus aureus (MRSA) with acute organ dysfunction, with infectious tricuspid valve endocarditis. Septic emboli to both lungs, kidney, shoulder, and possible left ankle. -Patient is currently on IV daptomycin, ID following -Seen by PCCM, Orthopedics. -Duration of IV antibiotics per ID. -MRI of left ankle was refused by patient -Cardiovascular surgery following for possible angiovac debridement once sepsis has cleared    Acute respiratory failure, tachypnea, in setting of narcotics withdrawal superimposed on bilateral embolic pneumonias. -Not oxygen dependent at baseline, currently on 2L [place] -Wean oxygen as able for sats > 90 % -Daily CXR -OOB  to chair each shift  Acute blood loss anemia/iron deficiency anemia -Hgb 6.7->(1 U PRBC's given, 2/22)->8.2->6.6 (1 U PRBC's given, 2/23)->8.6->8.0 -No evidence of active bleeding seen. -Trend H & H  Acute cystitis without hematuria -11/15/20 U/A positive for acute cystitis with bacteria, hyaline casts, and positive nitrites Urine culture positive for MRSA with resistance to oxacillin. -Vancomycin per pharmacy completed.    Non-Oliguric AKI with ATN -Creatinine on admission 1.3, currently 2.10 -Supportive care -Strict I/O's -Trend BMP  Hypertension -Improving, still not at goal  -Continue Norvasc 10 mg daily  Malnutrition to a moderate degree -Albumin 1.4, per patient can swallow and feed self but needs meal set up -Per SLP, Dysphagia 2 diet with full supervision, meds crushed in puree -If tolerate Dysphagia 2 diet, discontinue Coretrak and Osmolite 1.5, if needed supplement with Ensure  GERD -Continue PPI  Chronic Constipation -Continue bowel regimen   IV drug abuse, cocaine use, marijuana use, tobacco dependence -UDS positive for cocaine -Hepatitis C negative, HIV negative, RPR negative (11/16/20) -Consulted on the importance of cessation of drug and alcohol use -Continue methadone at current dose  Physical Debility -PT assessed with recommendations for SNF -OOB to chair every shift.    DVT prophylaxis: Heparin sq Code Status: Full Family Communication:  None at bedside  Status is: Inpatient  Dispo: The patient is from: Home              Anticipated d/c is to: SNF              Patient currently is not medically stable for discharge.   Difficult to place patient: No   Body mass index is 26.52 kg/m.  Consultants:   ID  PCCM  Nephrology  Orthopedic surgery  Cardiothoracic surgery  Procedures:   CT head  Right shoulder joint aspiration  2D echo, bilateral lower extremity Doppler ultrasound  Antimicrobials:   IV  Daptomycin   Subjective:  Patient seen and examined at bedside.  Mumbles some but answers questions appropriated is laying sideways in the bed.  When asked why she is getting tube feeds she said she didn't know but said at one point she had difficulty putting her hands to her face.  Now she says that is not a problem but she needs set up to eat.   Review of Systems Otherwise negative except as per HPI, including: General: Denies fever, chills, night sweats or unintended weight loss. Resp: Denies cough, wheezing, shortness of breath. Cardiac: Denies chest pain, palpitations, orthopnea, paroxysmal nocturnal dyspnea. GI: Denies abdominal pain, nausea, vomiting, diarrhea or constipation GU: Denies dysuria, frequency, hesitancy or incontinence MS: Denies muscle aches, joint pain or swelling Neuro: Denies headache, neurologic deficits (focal weakness, numbness, tingling), abnormal gait Psych: Denies anxiety, depression, SI/HI/AVH Skin: Denies new rashes or lesions ID: Denies sick contacts, exotic exposures, travel  Examination:  General exam: Appears calm and comfortable  Respiratory system: Lung sounds diminished, slight rales at bases. Respiratory effort normal. Cardiovascular system: S1 & S2 heard, RRR. No JVD, murmurs, rubs, gallops or clicks. No pedal edema. Gastrointestinal system: Abdomen is nondistended, soft and nontender. No organomegaly or masses felt. Normal bowel sounds heard. Central nervous system: Alert and oriented. No focal neurological deficits. Extremities: Symmetric 5 x 5 power. Skin: No rashes, lesions or ulcers Psychiatry: Judgement and insight appear normal. Mood & affect appropriate.     Objective: Vitals:   11/29/20 0744 11/29/20 0746 11/29/20 0800 11/29/20 0900  BP: (!) 145/102 (!) 145/93 (!) 156/99 122/68  Pulse: (!) 101 (!) 110 (!) 103 (!) 118  Resp: 20 (!) 21 (!) 22 18  Temp: 98.6 F (37 C) 98.3 F (36.8 C) 98.3 F (36.8 C)   TempSrc: Oral  Oral    SpO2: 91% 91% 92% 93%  Weight:      Height:        Intake/Output Summary (Last 24 hours) at 11/29/2020 1224 Last data filed at 11/29/2020 0800 Gross per 24 hour  Intake 1233 ml  Output 1250 ml  Net -17 ml   Filed Weights   11/24/20 0330 11/28/20 0428 11/29/20 0300  Weight: 82.7 kg 76.4 kg 76.8 kg     Data Reviewed:   CBC: Recent Labs  Lab 11/25/20 0534 11/25/20 1545 11/26/20 0418 11/27/20 0430 11/28/20 0505 11/29/20 0051  WBC 9.7  --  9.6 13.7* 12.5* 10.8*  NEUTROABS  --   --  7.8* 11.4* 10.2*  --   HGB 6.7* 8.2* 6.6* 8.6* 8.0* 8.1*  HCT 20.4* 25.3* 19.3* 26.9* 25.0* 24.7*  MCV 82.3  --  78.5* 82.3 82.8 82.3  PLT 205  --  212 220 212 208   Basic Metabolic Panel: Recent Labs  Lab 11/24/20 0355 11/25/20 0534 11/26/20 0418 11/26/20 1902 11/27/20 0430 11/27/20 2231 11/28/20 0505 11/29/20 0051  NA 134* 132* 135  --  136  --  135 136  K 5.1 4.6 4.6  --  4.7  --  4.2 4.2  CL 106 103 104  --  104  --  105 104  CO2 19* 19* 21*  --  21*  --  22 23  GLUCOSE 99 125* 101*  --  132*  --  125* 126*  BUN 82* 68* 58*  --  49*  --  43* 42*  CREATININE 2.75* 2.79* 2.81*  --  2.59*  --  2.22* 2.10*  CALCIUM 7.3* 7.8* 7.6*  --  8.2*  --  8.0* 8.0*  MG 2.6* 2.3 1.9  --  2.0  --  1.7  --   PHOS 7.4* 7.5*  --  5.3* 6.2* 5.5* 5.4*  --    GFR: Estimated Creatinine Clearance: 40 mL/min (A) (by C-G formula based on SCr of 2.1 mg/dL (H)). Liver Function Tests: Recent Labs  Lab 11/22/20 1400 11/23/20 0217 11/26/20 0418 11/27/20 0430 11/28/20 0505  AST  --   --  18 21 21   ALT  --   --  19 18 17   ALKPHOS  --   --  53 70 62  BILITOT  --   --  0.9 0.5 0.6  PROT  --   --  6.6 7.4 7.4  ALBUMIN 1.5* 1.4* 1.4* 1.5* 1.5*   No results for input(s): LIPASE, AMYLASE in the last 168 hours. No results for input(s): AMMONIA in the last 168 hours. Coagulation Profile: No results for input(s): INR, PROTIME in the last 168 hours. Cardiac Enzymes: Recent Labs  Lab 11/23/20 0217   CKTOTAL 45   BNP (last 3 results) No results for input(s): PROBNP in the last 8760 hours. HbA1C: No results for input(s): HGBA1C in the last 72 hours. CBG: Recent Labs  Lab 11/28/20 1628 11/28/20 2009 11/28/20 2354 11/29/20 0416 11/29/20 0739  GLUCAP 124* 110* 112* 121* 116*   Lipid Profile: No results for input(s): CHOL, HDL, LDLCALC, TRIG, CHOLHDL, LDLDIRECT in the last 72 hours. Thyroid Function Tests: No results for input(s): TSH, T4TOTAL, FREET4, T3FREE, THYROIDAB in the last 72 hours. Anemia Panel: No results for input(s): VITAMINB12, FOLATE, FERRITIN, TIBC, IRON, RETICCTPCT in the last 72 hours. Sepsis Labs: Recent Labs  Lab 11/25/20 0817 11/26/20 0418 11/27/20 1110 11/28/20 0505  PROCALCITON 0.86 0.81 0.64 0.66    Recent Results (from the past 240 hour(s))  Culture, blood (Routine X 2) w Reflex to ID Panel     Status: None   Collection Time: 11/21/20  5:26 AM   Specimen: BLOOD RIGHT HAND  Result Value Ref Range Status   Specimen Description BLOOD RIGHT HAND  Final   Special Requests   Final    BOTTLES DRAWN AEROBIC ONLY Blood Culture adequate volume   Culture   Final    NO GROWTH 5 DAYS Performed at [facility], [address], [place], [address] [place]    Report Status 11/26/2020 FINAL  Final  Culture, blood (Routine X 2) w Reflex to ID Panel     Status: None   Collection Time: 11/21/20  5:26 AM   Specimen: BLOOD LEFT HAND  Result Value Ref Range Status   Specimen Description BLOOD LEFT HAND  Final   Special Requests   Final    BOTTLES DRAWN AEROBIC ONLY Blood Culture adequate volume   Culture   Final    NO GROWTH 5 DAYS Performed at [facility], [address], [place], [address] [place]    Report Status 11/26/2020 FINAL  Final         Radiology Studies: No results found.      Scheduled Meds: . amLODipine  10 mg Oral Daily  . chlorhexidine  15 mL Mouth Rinse BID  . Chlorhexidine Gluconate Cloth  6 each Topical Daily   . docusate  sodium  100 mg Oral BID  . feeding supplement  237 mL Oral TID BM  . feeding supplement (PROSource TF)  45 mL Per Tube TID  . folic acid  1 mg Oral Daily  . heparin  5,000 Units Subcutaneous Q8H  . mouth rinse  15 mL Mouth Rinse q12n4p  . methadone  10 mg Oral Q8H  . multivitamin with minerals  1 tablet Oral Daily  . pantoprazole  40 mg Oral Daily  . polyethylene glycol  17 g Oral BID  . QUEtiapine  25 mg Oral BID  . senna  2 tablet Oral Daily  . sodium chloride flush  10-40 mL Intracatheter Q12H  . sodium chloride flush  3 mL Intravenous Q12H  . thiamine  100 mg Oral Daily   Continuous Infusions: . sodium chloride Stopped (11/23/20 2116)  . DAPTOmycin (CUBICIN)  IV 764 mg (11/28/20 2007)  . feeding supplement (OSMOLITE 1.5 CAL) 1,000 mL (11/29/20 0954)     LOS: 13 days   Time spent= 35 mins    Shearon BaloLisa P Rudd, RN NP-Student   If 7PM-7AM, please contact night-coverage  11/29/2020, 12:24 PM

## 2020-11-29 NOTE — Progress Notes (Signed)
  Speech Language Pathology Treatment: Dysphagia  Patient Details Name: Anne Bautista MRN: [id number] DOB: 1986-03-02 Today's Date: 11/29/2020 Time: 7867-6720 SLP Time Calculation (min) (ACUTE ONLY): 13 min  Assessment / Plan / Recommendation Clinical Impression  Pt was seen for skilled ST targeting diet tolerance and diagnostic treatment.  Pt was encountered awake and confused, lying sideways in bed.  She was repositioned with HOB elevated prior to PO trials.  RN reported that pt had limited PO intake secondary to not enjoying pureed foods (Dysphagia 1 diet).  Pt confirmed this.  Pt was seen with regular solids (cracker) and thin liquid via straw sip.  Mastication of regular solids was mildly prolonged and pt had mild oral residue that she was not aware of.  Pt was able to clear residue with a cued liquid wash.  No overt s/sx of aspiration were observed with PO trials on this date.  In order to hopefully increase pt's PO intake, recommend diet upgrade to Dysphagia 2 (ground) solids and thin liquids with FULL supervision to cue for compensatory strategies, including sitting upright and alternating bites and sips.  Continue to administer medications crushed in puree.  SLP will f/u to monitor diet tolerance per POC.     HPI HPI: Pt is a 35 y.o. admitted 2/14 with sepsis due to MRSA bacteremia in setting of IVDU. Rt shoulder pain s/p aspiration 2/14. PMHx: IVDU, malnutrition, poor dentition. Toxicology positive for cocaine. CXR 2/17: Stable bilateral rounded nodular densities are noted throughout both  lungs concerning for septic emboli or possibly metastatic disease.  Increased left basilar opacity is noted concerning for worsening  pneumonia with possible associated pleural effusion.  CT head Air-fluid level in the right maxillary sinus suggesting  rhinosinusitis. CXR 2/10: Slightly increased bilateral LOWER lung consolidation/atelectasis  and trace bilateral pleural effusions.      SLP Plan  Continue  with current plan of care       Recommendations  Diet recommendations: Dysphagia 2 (fine chop);Thin liquid Liquids provided via: Cup;Straw Medication Administration: Crushed with puree Supervision: Staff to assist with self feeding;Full supervision/cueing for compensatory strategies Compensations: Slow rate;Small sips/bites Postural Changes and/or Swallow Maneuvers: Seated upright 90 degrees                Oral Care Recommendations: Oral care BID Follow up Recommendations: Skilled Nursing facility;24 hour supervision/assistance SLP Visit Diagnosis: Dysphagia, unspecified (R13.10) Plan: Continue with current plan of care       GO               Villa Herb M.S., CCC-SLP Acute Rehabilitation Services Office: [phone]  [facility] 11/29/2020, 9:50 AM

## 2020-11-29 NOTE — Plan of Care (Signed)
  Problem: Clinical Measurements: Goal: Signs and symptoms of infection will decrease Outcome: Progressing   Problem: Education: Goal: Knowledge of General Education information will improve Description: Including pain rating scale, medication(s)/side effects and non-pharmacologic comfort measures Outcome: Progressing   Problem: Clinical Measurements: Goal: Ability to maintain clinical measurements within normal limits will improve Outcome: Progressing   Problem: Clinical Measurements: Goal: Diagnostic test results will improve Outcome: Progressing   Problem: Clinical Measurements: Goal: Cardiovascular complication will be avoided Outcome: Progressing   Problem: Activity: Goal: Risk for activity intolerance will decrease Outcome: Progressing   Problem: Nutrition: Goal: Adequate nutrition will be maintained Outcome: Progressing   Problem: Coping: Goal: Level of anxiety will decrease Outcome: Progressing   Problem: Pain Managment: Goal: General experience of comfort will improve Outcome: Progressing   Problem: Skin Integrity: Goal: Risk for impaired skin integrity will decrease Outcome: Progressing

## 2020-11-30 DIAGNOSIS — A419 Sepsis, unspecified organism: Secondary | ICD-10-CM

## 2020-11-30 LAB — CK: Total CK: 23 U/L — ABNORMAL LOW (ref 38–234)

## 2020-11-30 LAB — BASIC METABOLIC PANEL
Anion gap: 10 (ref 5–15)
BUN: 37 mg/dL — ABNORMAL HIGH (ref 6–20)
CO2: 26 mmol/L (ref 22–32)
Calcium: 8.4 mg/dL — ABNORMAL LOW (ref 8.9–10.3)
Chloride: 102 mmol/L (ref 98–111)
Creatinine, Ser: 1.77 mg/dL — ABNORMAL HIGH (ref 0.44–1.00)
GFR, Estimated: 38 mL/min — ABNORMAL LOW (ref >60)
Glucose, Bld: 120 mg/dL — ABNORMAL HIGH (ref 70–99)
Potassium: 4.3 mmol/L (ref 3.5–5.1)
Sodium: 138 mmol/L (ref 135–145)

## 2020-11-30 LAB — CBC
HCT: 25.4 % — ABNORMAL LOW (ref 36.0–46.0)
Hemoglobin: 8.3 g/dL — ABNORMAL LOW (ref 12.0–15.0)
MCH: 27.3 pg (ref 26.0–34.0)
MCHC: 32.7 g/dL (ref 30.0–36.0)
MCV: 83.6 fL (ref 80.0–100.0)
Platelets: 218 10*3/uL (ref 150–400)
RBC: 3.04 MIL/uL — ABNORMAL LOW (ref 3.87–5.11)
RDW: 18.4 % — ABNORMAL HIGH (ref 11.5–15.5)
WBC: 10.2 10*3/uL (ref 4.0–10.5)
nRBC: 0 % (ref 0.0–0.2)

## 2020-11-30 LAB — GLUCOSE, CAPILLARY
Glucose-Capillary: 106 mg/dL — ABNORMAL HIGH (ref 70–99)
Glucose-Capillary: 108 mg/dL — ABNORMAL HIGH (ref 70–99)
Glucose-Capillary: 113 mg/dL — ABNORMAL HIGH (ref 70–99)
Glucose-Capillary: 124 mg/dL — ABNORMAL HIGH (ref 70–99)
Glucose-Capillary: 124 mg/dL — ABNORMAL HIGH (ref 70–99)

## 2020-11-30 NOTE — Progress Notes (Signed)
   11/30/20 0741  Assess: MEWS Score  Temp 98.2 F (36.8 C)  BP (!) 143/90  Pulse Rate (!) 120  ECG Heart Rate (!) 127  Resp (!) 24  Level of Consciousness Alert  SpO2 95 %  O2 Device Nasal Cannula  O2 Flow Rate (L/min) 2 L/min  Assess: MEWS Score  MEWS Temp 0  MEWS Systolic 0  MEWS Pulse 2  MEWS RR 1  MEWS LOC 0  MEWS Score 3  MEWS Score Color Yellow  Assess: if the MEWS score is Yellow or Red  Were vital signs taken at a resting state? Yes  Focused Assessment No change from prior assessment  Early Detection of Sepsis Score *See Row Information* Low  MEWS guidelines implemented *See Row Information* No, previously yellow, continue vital signs every 4 hours  Treat  Pain Scale 0-10  Pain Score Asleep  Take Vital Signs  Increase Vital Sign Frequency  Yellow: Q 2hr X 2 then Q 4hr X 2, if remains yellow, continue Q 4hrs  Notify: Charge Nurse/RN  Name of Charge Nurse/RN Notified Ben RN  Date Charge Nurse/RN Notified 11/30/20  Time Charge Nurse/RN Notified 0741  Notify: Provider  Provider Name/Title Dr. Sharl Ma  Date Provider Notified 11/30/20  Time Provider Notified 0800  Notification Type Rounds  Provider response No new orders  Date of Provider Response 11/30/20  Time of Provider Response 0800  Document  Patient Outcome Not stable and remains on department  Progress note created (see row info) Yes

## 2020-11-30 NOTE — Progress Notes (Addendum)
PROGRESS NOTE    Anne Bautista  DEY:[id number] DOB: 1986/03/08 DOA: 11/15/2020 PCP: Patient, No Pcp Per   Brief Narrative:   Patient is a 35 year old female with PMH significant for IV drug use, tobacco abuse, and malnutrition who presented to he ED with a one week history of myalgia and cough.  In the ED she was diagnosed with sepsis and admitted by pulmonary critical care.  On admission, she was diagnosed with bacteremia, tricuspid valve endocarditis and septic emboli to both lungs, kidneys, right shoulder, and possibly left ankle.  ID, cardiothoracic surgery, and orthopedics were consulted.  On day 9 of hospitalization, 11/25/20, she was transferred to hospitalist service. Per ID, recommend treatment of bacteremia and endocarditis with 6 week course of IV daptomycin. Patient has been having issues with pain and has a current pain management system in place.  Assessment & Plan:   Principal Problem:   Severe sepsis due to methicillin resistant Staphylococcus aureus (MRSA) with acute organ dysfunction (HCC) Active Problems:   IV drug abuse (HCC)   Poor dentition   Malnutrition (HCC)   Thrombocytopenia (HCC)   Tobacco dependence   Acute cystitis without hematuria   Malnutrition of moderate degree  Severe sepsis due to methicillin resistant Staphylococcus aureus (MRSA) with acute organ dysfunction, with infectious tricuspid valve endocarditis. Septic emboli to both lungs, kidney, shoulder, and possible left ankle. -Orthopedics consulted for right shoulder and left ankle pain with concern for septic joints.  Right shoulder aspiration positive for MRSA, septic left ankle r/o by orthopedics. MRI left ankle was refused by patient. -Cardiovascular surgery following for possible angiovac debridement once sepsis has cleared. -Infectious Disease consulted for management of bacteremia and endocarditis, recommend a 6 week course of IV daptomycin -Continue IV daptomycin, pharmacy to dose with weekly CK  levels on Sunday.   Acute respiratory failure, tachypnea, in setting of narcotics withdrawal superimposed on bilateral embolic pneumonias. -Not oxygen dependent at baseline, currently on 2L [place] -Wean oxygen as able for sats > 90 % -Daily CXR -OOB to chair each shift  Acute blood loss anemia/iron deficiency anemia -Hgb 6.7->(1 U PRBC's given, 2/22)->8.2->6.6 (1 U PRBC's given, 2/23)->8.6->8.0 -No evidence of active bleeding seen. -Trend H & H  Acute cystitis without hematuria -11/15/20 U/A positive for acute cystitis with bacteria, hyaline casts, and positive nitrites Urine culture positive for MRSA with resistance to oxacillin. -Vancomycin per pharmacy completed.    Non-Oliguric AKI with ATN -Creatinine on admission 1.3, peak 2.81, trending down to 1.77 from 2.10 yesterday -Supportive care -Strict I/O's -Trend BMP  Hypertension -Improving, still not at goal  -Continue Norvasc 10 mg daily  Malnutrition to a moderate degree -Albumin 1.4, per patient can swallow and feed self but needs meal set up -Per SLP, Dysphagia 2 diet with full supervision, meds crushed in puree -Continues to have poor appetite eating <10 % of meals -Continue Coretrak with Osmolite 1.5   GERD -Continue PPI  Chronic Constipation -Continue bowel regimen   IV drug abuse, cocaine use, marijuana use, tobacco dependence -UDS positive for cocaine -Hepatitis C negative, HIV negative, RPR negative (11/16/20) -Consulted on the importance of cessation of drug and alcohol use -Continue methadone at current dose  Physical Debility -PT assessed with recommendations for SNF -OOB to chair every shift.   DVT prophylaxis: Heparin sq Code Status: Full Family Communication:  None at bedside  Status is: Inpatient  Dispo: The patient is from: Home  Anticipated d/c is to: SNF              Patient currently is not medically stable for discharge   Difficult to place patient: No    Body  mass index is 26.79 kg/m.   Consultants:   ID  PCCM  Nephrology  Orthopedic surgery  Cardiothoracic surgery  Procedures:   CT head  Right shoulder joint aspiration  2D echo, bilateral lower extremity Doppler ultrasound  Antimicrobials:   IV Daptomycin  Subjective:  Patient seen and examined at bedside. Moaning and stating that "her arm hurts because she slept on it wrong".  Examined again later this am, still moaning this time states that "her belly hurts after she ate".  Review of Systems Otherwise negative except as per HPI, including: General: Denies fever, chills, night sweats or unintended weight loss. Resp: Denies cough, wheezing, shortness of breath. Cardiac: Denies chest pain, palpitations, orthopnea, paroxysmal nocturnal dyspnea. GI: Endorses generalized abdominal pain. Endorses poor appetite. Denies nausea, vomiting, diarrhea or constipation GU: Denies dysuria, frequency, hesitancy or incontinence MS: Denies muscle aches, joint pain or swelling Neuro: Denies headache, neurologic deficits (focal weakness, numbness, tingling), abnormal gait Psych: Denies anxiety, depression, SI/HI/AVH Skin: Denies new rashes or lesions ID: Denies sick contacts, exotic exposures, travel  Examination:  General exam: Appears calm and comfortable  Respiratory system: Clear to auscultation. Respiratory effort normal. Cardiovascular system: S1 & S2 heard, RRR. No JVD, murmurs, rubs, gallops or clicks. No pedal edema. Gastrointestinal system: Abdomen is nondistended, soft.  No guarding or tenderness. No organomegaly or masses felt. Normal bowel sounds heard. Central nervous system: Alert and oriented. No focal neurological deficits. Extremities: Symmetric 5 x 5 power. Skin: No rashes, lesions or ulcers Psychiatry: Judgement and insight appear normal. Mood & affect appropriate.     Objective: Vitals:   11/29/20 2300 11/30/20 0300 11/30/20 0500 11/30/20 0741  BP: 140/87 (!)  144/84  (!) 143/90  Pulse: (!) 124 (!) 120  (!) 120  Resp: 20 (!) 22  (!) 24  Temp: 98.8 F (37.1 C) 98.2 F (36.8 C)  98.2 F (36.8 C)  TempSrc: Oral Oral  Oral  SpO2: 94% 95%  95%  Weight:   77.6 kg   Height:        Intake/Output Summary (Last 24 hours) at 11/30/2020 1050 Last data filed at 11/30/2020 0800 Gross per 24 hour  Intake 1765 ml  Output 2100 ml  Net -335 ml   Filed Weights   11/28/20 0428 11/29/20 0300 11/30/20 0500  Weight: 76.4 kg 76.8 kg 77.6 kg     Data Reviewed:   CBC: Recent Labs  Lab 11/26/20 0418 11/27/20 0430 11/28/20 0505 11/29/20 0051 11/30/20 0637  WBC 9.6 13.7* 12.5* 10.8* 10.2  NEUTROABS 7.8* 11.4* 10.2*  --   --   HGB 6.6* 8.6* 8.0* 8.1* 8.3*  HCT 19.3* 26.9* 25.0* 24.7* 25.4*  MCV 78.5* 82.3 82.8 82.3 83.6  PLT 212 220 212 208 218   Basic Metabolic Panel: Recent Labs  Lab 11/24/20 0355 11/25/20 0534 11/26/20 0418 11/26/20 1902 11/27/20 0430 11/27/20 2231 11/28/20 0505 11/29/20 0051 11/30/20 0637  NA 134* 132* 135  --  136  --  135 136 138  K 5.1 4.6 4.6  --  4.7  --  4.2 4.2 4.3  CL 106 103 104  --  104  --  105 104 102  CO2 19* 19* 21*  --  21*  --  22 23 26   GLUCOSE 99  125* 101*  --  132*  --  125* 126* 120*  BUN 82* 68* 58*  --  49*  --  43* 42* 37*  CREATININE 2.75* 2.79* 2.81*  --  2.59*  --  2.22* 2.10* 1.77*  CALCIUM 7.3* 7.8* 7.6*  --  8.2*  --  8.0* 8.0* 8.4*  MG 2.6* 2.3 1.9  --  2.0  --  1.7  --   --   PHOS 7.4* 7.5*  --  5.3* 6.2* 5.5* 5.4*  --   --    GFR: Estimated Creatinine Clearance: 47.6 mL/min (A) (by C-G formula based on SCr of 1.77 mg/dL (H)). Liver Function Tests: Recent Labs  Lab 11/26/20 0418 11/27/20 0430 11/28/20 0505  AST 18 21 21   ALT 19 18 17   ALKPHOS 53 70 62  BILITOT 0.9 0.5 0.6  PROT 6.6 7.4 7.4  ALBUMIN 1.4* 1.5* 1.5*   No results for input(s): LIPASE, AMYLASE in the last 168 hours. No results for input(s): AMMONIA in the last 168 hours. Coagulation Profile: No results for  input(s): INR, PROTIME in the last 168 hours. Cardiac Enzymes: Recent Labs  Lab 11/30/20 0637  CKTOTAL 23*   BNP (last 3 results) No results for input(s): PROBNP in the last 8760 hours. HbA1C: No results for input(s): HGBA1C in the last 72 hours. CBG: Recent Labs  Lab 11/29/20 1614 11/29/20 2042 11/29/20 2325 11/30/20 0406 11/30/20 0749  GLUCAP 114* 104* 111* 124* 106*   Lipid Profile: No results for input(s): CHOL, HDL, LDLCALC, TRIG, CHOLHDL, LDLDIRECT in the last 72 hours. Thyroid Function Tests: No results for input(s): TSH, T4TOTAL, FREET4, T3FREE, THYROIDAB in the last 72 hours. Anemia Panel: No results for input(s): VITAMINB12, FOLATE, FERRITIN, TIBC, IRON, RETICCTPCT in the last 72 hours. Sepsis Labs: Recent Labs  Lab 11/25/20 0817 11/26/20 0418 11/27/20 1110 11/28/20 0505  PROCALCITON 0.86 0.81 0.64 0.66    Recent Results (from the past 240 hour(s))  Culture, blood (Routine X 2) w Reflex to ID Panel     Status: None   Collection Time: 11/21/20  5:26 AM   Specimen: BLOOD RIGHT HAND  Result Value Ref Range Status   Specimen Description BLOOD RIGHT HAND  Final   Special Requests   Final    BOTTLES DRAWN AEROBIC ONLY Blood Culture adequate volume   Culture   Final    NO GROWTH 5 DAYS Performed at [facility], [address] [place], [place] [address]    Report Status 11/26/2020 FINAL  Final  Culture, blood (Routine X 2) w Reflex to ID Panel     Status: None   Collection Time: 11/21/20  5:26 AM   Specimen: BLOOD LEFT HAND  Result Value Ref Range Status   Specimen Description BLOOD LEFT HAND  Final   Special Requests   Final    BOTTLES DRAWN AEROBIC ONLY Blood Culture adequate volume   Culture   Final    NO GROWTH 5 DAYS Performed at [facility], [address] [place], [place] [address]    Report Status 11/26/2020 FINAL  Final         Radiology Studies: No results found.    Scheduled Meds: . amLODipine  10 mg Oral Daily  .  chlorhexidine  15 mL Mouth Rinse BID  . Chlorhexidine Gluconate Cloth  6 each Topical Daily  . docusate sodium  100 mg Oral BID  . feeding supplement  237 mL Oral TID BM  . feeding  supplement (PROSource TF)  45 mL Per Tube TID  . folic acid  1 mg Oral Daily  . heparin  5,000 Units Subcutaneous Q8H  . mouth rinse  15 mL Mouth Rinse q12n4p  . methadone  10 mg Oral Q8H  . multivitamin with minerals  1 tablet Oral Daily  . pantoprazole  40 mg Oral Daily  . polyethylene glycol  17 g Oral BID  . QUEtiapine  25 mg Oral BID  . senna  2 tablet Oral Daily  . sodium chloride flush  10-40 mL Intracatheter Q12H  . sodium chloride flush  3 mL Intravenous Q12H  . thiamine  100 mg Oral Daily   Continuous Infusions: . sodium chloride Stopped (11/23/20 2116)  . DAPTOmycin (CUBICIN)  IV 764 mg (11/29/20 1957)  . feeding supplement (OSMOLITE 1.5 CAL) 1,000 mL (11/30/20 0625)     LOS: 14 days   Time spent= 35 mins   Shearon Balo, RN NP-student   If 7PM-7AM, please contact night-coverage  11/30/2020, 10:50 AM

## 2020-11-30 NOTE — Plan of Care (Signed)
  Problem: Fluid Volume: Goal: Hemodynamic stability will improve Outcome: Progressing   Problem: Clinical Measurements: Goal: Diagnostic test results will improve Outcome: Progressing   Problem: Clinical Measurements: Goal: Signs and symptoms of infection will decrease Outcome: Progressing   Problem: Education: Goal: Knowledge of General Education information will improve Description: Including pain rating scale, medication(s)/side effects and non-pharmacologic comfort measures Outcome: Progressing   Problem: Clinical Measurements: Goal: Ability to maintain clinical measurements within normal limits will improve Outcome: Progressing   Problem: Activity: Goal: Risk for activity intolerance will decrease Outcome: Progressing   Problem: Nutrition: Goal: Adequate nutrition will be maintained Outcome: Progressing   Problem: Coping: Goal: Level of anxiety will decrease Outcome: Progressing   Problem: Pain Managment: Goal: General experience of comfort will improve Outcome: Progressing   Problem: Skin Integrity: Goal: Risk for impaired skin integrity will decrease Outcome: Progressing

## 2020-11-30 NOTE — Progress Notes (Signed)
Pharmacy Antibiotic Note  Anne Bautista is a 35 y.o. female admitted on 11/15/2020 with Sepsis 2/2 MRSA IE with septic emboli to both lungs, kidneys, R shoulder and possibly L ankle.,.  Pharmacy has been consulted for Daptomycin dosing.  ID: Sepsis 2/2 MRSA IE with septic emboli to both lungs, kidneys, R shoulder and possibly L ankle., CRP 19, ESR 112 -CT AP septic emboli,  LA 3.1>0.8, CK 43, CRP elevated, PC wNL -2/14 ECHO with 2x1 cm vegetation, trivial regurg, f/u TEE -R shoulder synovial fluid WBCs 8250 - Afebrile. WBC trending down to 10.2. Scr down to 1.77  Cefepime 2/12 >> 2/13 Vancomycin 2/12 >> 2/16 Dapto 2/16>> Doxy 2/16>>2/20  2/17: CK 280 2/20: CK 45 2/27: CK 23  2/12 Bcx: 3/3 MRSA  2/12 COVID PCR: neg 2/12 UCx: > 100K MRSA 2/14 Bcx: 1/4 MRSA 2/14 R shoulder fluid: MRSA 2/18 Bcx negative  Plan: -Dapto for MRSA TV IE 64m/kg/24h -CK weekly on Sunday,     Height: '5\' 7"'  (170.2 cm) Weight: 77.6 kg (171 lb 1.2 oz) IBW/kg (Calculated) : 61.6  Temp (24hrs), Avg:98.6 F (37 C), Min:98.2 F (36.8 C), Max:98.9 F (37.2 C)  Recent Labs  Lab 11/26/20 0418 11/27/20 0430 11/28/20 0505 11/29/20 0051 11/30/20 0637  WBC 9.6 13.7* 12.5* 10.8* 10.2  CREATININE 2.81* 2.59* 2.22* 2.10* 1.77*    Estimated Creatinine Clearance: 47.6 mL/min (A) (by C-G formula based on SCr of 1.77 mg/dL (H)).    No Known Allergies    RWayland Salinas2/27/2022 10:34 AM

## 2020-11-30 NOTE — Progress Notes (Signed)
Pt is moaning all day along. Although most of the time she refused to have pain but still  keeps moaning. Given Dilaudid twice and Ativan once in AM shift for a complain of back pain and anxiety. Reinforced to eat, but pt's appetite is very low. Osmolite @1 .5 is contd. Pt is in RA pretty much all the time, assisted to [facility] twice but refused to sit in a recliner. MEWS score is yellow and [facility] aware of the situation.  Will continue to monitor the patient  [place], RN

## 2020-12-01 ENCOUNTER — Inpatient Hospital Stay (HOSPITAL_COMMUNITY): Payer: Self-pay

## 2020-12-01 ENCOUNTER — Inpatient Hospital Stay: Payer: Self-pay

## 2020-12-01 DIAGNOSIS — M79672 Pain in left foot: Secondary | ICD-10-CM

## 2020-12-01 DIAGNOSIS — I76 Septic arterial embolism: Secondary | ICD-10-CM

## 2020-12-01 DIAGNOSIS — R7881 Bacteremia: Secondary | ICD-10-CM

## 2020-12-01 DIAGNOSIS — R109 Unspecified abdominal pain: Secondary | ICD-10-CM

## 2020-12-01 DIAGNOSIS — M00011 Staphylococcal arthritis, right shoulder: Secondary | ICD-10-CM

## 2020-12-01 DIAGNOSIS — I079 Rheumatic tricuspid valve disease, unspecified: Secondary | ICD-10-CM

## 2020-12-01 DIAGNOSIS — B9562 Methicillin resistant Staphylococcus aureus infection as the cause of diseases classified elsewhere: Secondary | ICD-10-CM

## 2020-12-01 LAB — BASIC METABOLIC PANEL
Anion gap: 10 (ref 5–15)
BUN: 35 mg/dL — ABNORMAL HIGH (ref 6–20)
CO2: 24 mmol/L (ref 22–32)
Calcium: 8.1 mg/dL — ABNORMAL LOW (ref 8.9–10.3)
Chloride: 103 mmol/L (ref 98–111)
Creatinine, Ser: 1.55 mg/dL — ABNORMAL HIGH (ref 0.44–1.00)
GFR, Estimated: 45 mL/min — ABNORMAL LOW (ref >60)
Glucose, Bld: 122 mg/dL — ABNORMAL HIGH (ref 70–99)
Potassium: 4.3 mmol/L (ref 3.5–5.1)
Sodium: 137 mmol/L (ref 135–145)

## 2020-12-01 LAB — CBC
HCT: 26.4 % — ABNORMAL LOW (ref 36.0–46.0)
Hemoglobin: 8.1 g/dL — ABNORMAL LOW (ref 12.0–15.0)
MCH: 25.9 pg — ABNORMAL LOW (ref 26.0–34.0)
MCHC: 30.7 g/dL (ref 30.0–36.0)
MCV: 84.3 fL (ref 80.0–100.0)
Platelets: 215 10*3/uL (ref 150–400)
RBC: 3.13 MIL/uL — ABNORMAL LOW (ref 3.87–5.11)
RDW: 18.3 % — ABNORMAL HIGH (ref 11.5–15.5)
WBC: 9.2 10*3/uL (ref 4.0–10.5)
nRBC: 0 % (ref 0.0–0.2)

## 2020-12-01 LAB — GLUCOSE, CAPILLARY
Glucose-Capillary: 105 mg/dL — ABNORMAL HIGH (ref 70–99)
Glucose-Capillary: 108 mg/dL — ABNORMAL HIGH (ref 70–99)
Glucose-Capillary: 113 mg/dL — ABNORMAL HIGH (ref 70–99)
Glucose-Capillary: 121 mg/dL — ABNORMAL HIGH (ref 70–99)
Glucose-Capillary: 121 mg/dL — ABNORMAL HIGH (ref 70–99)
Glucose-Capillary: 97 mg/dL (ref 70–99)

## 2020-12-01 MED ORDER — DICYCLOMINE HCL 20 MG PO TABS
20.0000 mg | ORAL_TABLET | Freq: Three times a day (TID) | ORAL | Status: DC
  Administered 2020-12-01 – 2020-12-29 (×106): 20 mg via ORAL
  Filled 2020-12-01 (×119): qty 1

## 2020-12-01 MED ORDER — OSMOLITE 1.5 CAL PO LIQD
1000.0000 mL | ORAL | Status: DC
  Administered 2020-12-01 – 2020-12-03 (×4): 1000 mL
  Filled 2020-12-01 (×2): qty 1000

## 2020-12-01 MED ORDER — SODIUM CHLORIDE 0.9% FLUSH
10.0000 mL | Freq: Two times a day (BID) | INTRAVENOUS | Status: DC
  Administered 2020-12-02 – 2020-12-17 (×28): 10 mL
  Administered 2020-12-17: 20 mL
  Administered 2020-12-18 – 2020-12-29 (×21): 10 mL

## 2020-12-01 MED ORDER — CLONIDINE HCL 0.1 MG PO TABS
0.1000 mg | ORAL_TABLET | Freq: Two times a day (BID) | ORAL | Status: DC
  Administered 2020-12-01 – 2020-12-29 (×55): 0.1 mg via ORAL
  Filled 2020-12-01 (×56): qty 1

## 2020-12-01 MED ORDER — SODIUM CHLORIDE 0.9% FLUSH
10.0000 mL | INTRAVENOUS | Status: DC | PRN
  Administered 2020-12-21: 10 mL

## 2020-12-01 NOTE — Progress Notes (Addendum)
  Speech Language Pathology Treatment: Dysphagia  Patient Details Name: Leah Thornberry MRN: [id number] DOB: November 17, 1985 Today's Date: 12/01/2020 Time: 5465-0354 SLP Time Calculation (min) (ACUTE ONLY): 13 min  Assessment / Plan / Recommendation Clinical Impression  No overt s/s of aspiration were noted while pt took her medicine with liquids; however, an immediate cough was noted with a single bite of an upgraded PO trial of solids. PO trials of solids were limited due to pt's report that she was not hungry despite encouragement from SLP. Pt's oral phase now appears [facility] when consuming thin liquids and taking whole pills, but prolonged mastication is still noted with solids. Pt's current DYS 2 diet with thin liquids continues to appear to be the most appropriate diet for her at this time; however, recommend that pt take whole pills with liquids now. SLP will follow up for continued PO trials of upgraded diets pending improvements in her mentation.   HPI HPI: Pt is a 35 y.o. admitted 2/14 with sepsis due to MRSA bacteremia in setting of IVDU. Rt shoulder pain s/p aspiration 2/14. PMHx: IVDU, malnutrition, poor dentition. Toxicology positive for cocaine. CXR 2/17: Stable bilateral rounded nodular densities are noted throughout both  lungs concerning for septic emboli or possibly metastatic disease.  Increased left basilar opacity is noted concerning for worsening  pneumonia with possible associated pleural effusion.  CT head Air-fluid level in the right maxillary sinus suggesting  rhinosinusitis. CXR 2/10: Slightly increased bilateral LOWER lung consolidation/atelectasis  and trace bilateral pleural effusions.      SLP Plan  Continue with current plan of care       Recommendations  Diet recommendations: Dysphagia 2 (fine chop);Thin liquid Liquids provided via: Cup;Straw Medication Administration: Whole meds with liquid Supervision: Staff to assist with self feeding;Full supervision/cueing for  compensatory strategies Compensations: Slow rate;Small sips/bites;Follow solids with liquid Postural Changes and/or Swallow Maneuvers: Seated upright 90 degrees;Upright 30-60 min after meal                Oral Care Recommendations: Oral care BID Follow up Recommendations: Skilled Nursing facility;24 hour supervision/assistance SLP Visit Diagnosis: Dysphagia, unspecified (R13.10) Plan: Continue with current plan of care       GO                Zettie Cooley., SLP Student 12/01/2020, 11:32 AM

## 2020-12-01 NOTE — Progress Notes (Signed)
Peripherally Inserted Central Catheter Placement  The IV Nurse has discussed with the patient and/or persons authorized to consent for the patient, the purpose of this procedure and the potential benefits and risks involved with this procedure.  The benefits include less needle sticks, lab draws from the catheter, and the patient may be discharged home with the catheter. Risks include, but not limited to, infection, bleeding, blood clot (thrombus formation), and puncture of an artery; nerve damage and irregular heartbeat and possibility to perform a PICC exchange if needed/ordered by physician.  Alternatives to this procedure were also discussed.  Bard Power PICC patient education guide, fact sheet on infection prevention and patient information card has been provided to patient /or left at bedside.    PICC Placement Documentation  PICC Single Lumen 12/01/20 PICC Left Basilic 40 cm 0 cm (Active)  Indication for Insertion or Continuance of Line Prolonged intravenous therapies 12/01/20 1608  Exposed Catheter (cm) 0 cm 12/01/20 1608  Site Assessment Clean;Dry;Intact 12/01/20 1608  Line Status Flushed;Saline locked;Blood return noted 12/01/20 1608  Dressing Type Transparent;Securing device 12/01/20 1608  Dressing Status Clean;Dry;Intact 12/01/20 1608  Antimicrobial disc in place? Yes 12/01/20 1608  Dressing Intervention New dressing 12/01/20 1608  Dressing Change Due 12/08/20 12/01/20 1608       Annett Fabian 12/01/2020, 4:09 PM

## 2020-12-01 NOTE — Progress Notes (Signed)
Physical Therapy Treatment Patient Details Name: Anne Bautista MRN: [id number] DOB: Jul 05, 1986 Today's Date: 12/01/2020    History of Present Illness 35 yo admitted 2/14 with sepsis due to MRSA bacteremia in setting of IVDU. Rt shoulder pain s/p aspiration 2/14. PMHx: IVDU, malnutrition, poor dentition    PT Comments    Pt participated in session with increased moaning. Increased cueing needed to increase participation in functional mobility tasks. Pt requiring increased assist for supine>sit but demonstrating improved transfers and ambulation tolerance during session. Pt will continue to benefit from skilled PT to address deficits in balance, strength, coordination, gait and endurance to maximize independence with functional mobility prior to discharge.     Follow Up Recommendations  SNF;Supervision for mobility/OOB     Equipment Recommendations  Wheelchair (measurements PT);3in1 (PT)    Recommendations for Other Services       Precautions / Restrictions Precautions Precautions: Fall Precaution Comments: right shoulder, left ankle, back pain, watch sats/RR, bil UE edema Restrictions Weight Bearing Restrictions: No    Mobility  Bed Mobility Overal bed mobility: Needs Assistance Bed Mobility: Supine to Sit     Supine to sit: Mod assist;HOB elevated     General bed mobility comments: increased time and effort to come to EoB, maximal encouragement and one step commands for sequencing, mod A for bringing trunk to upright, pt able to scoot to EOB wtihout assist    Transfers Overall transfer level: Needs assistance Equipment used: Rolling walker (2 wheeled) Transfers: Sit to/from Stand Sit to Stand: Min assist         General transfer comment: min A for power up to RW, increased  moaning  Ambulation/Gait Ambulation/Gait assistance: Min assist Gait Distance (Feet): 15 Feet Assistive device: Rolling walker (2 wheeled)   Gait velocity: slowed   General Gait Details:  min A for steadying   Stairs             Wheelchair Mobility    Modified Rankin (Stroke Patients Only)       Balance                                            Cognition                                              Exercises      General Comments General comments (skin integrity, edema, etc.): VSS on RA      Pertinent Vitals/Pain Pain Assessment: Faces Faces Pain Scale: Hurts even more Pain Location: pt moaning throughout session. pt states her stomach is hurting Pain Descriptors / Indicators: Discomfort;Grimacing Pain Intervention(s): Monitored during session;Patient requesting pain meds-RN notified    Home Living                      Prior Function            PT Goals (current goals can now be found in the care plan section) Acute Rehab PT Goals Patient Stated Goal: none stated today PT Goal Formulation: With patient Time For Goal Achievement: 12/02/20 Potential to Achieve Goals: Fair Progress towards PT goals: Progressing toward goals    Frequency    Min 3X/week      PT Plan Current plan remains appropriate  Co-evaluation PT/OT/SLP Co-Evaluation/Treatment: Yes Reason for Co-Treatment: Complexity of the patient's impairments (multi-system involvement);Necessary to address cognition/behavior during functional activity;To address functional/ADL transfers PT goals addressed during session: Mobility/safety with mobility;Balance;Proper use of DME;Strengthening/ROM        AM-PAC PT "6 Clicks" Mobility   Outcome Measure  Help needed turning from your back to your side while in a flat bed without using bedrails?: None Help needed moving from lying on your back to sitting on the side of a flat bed without using bedrails?: A Lot Help needed moving to and from a bed to a chair (including a wheelchair)?: A Little Help needed standing up from a chair using your arms (e.g., wheelchair or bedside chair)?:  A Lot Help needed to walk in hospital room?: A Little Help needed climbing 3-5 steps with a railing? : A Lot 6 Click Score: 16    End of Session Equipment Utilized During Treatment: Gait belt Activity Tolerance: Patient limited by pain Patient left: with call bell/phone within reach;in chair;with chair alarm set Nurse Communication: Mobility status PT Visit Diagnosis: Other abnormalities of gait and mobility (R26.89);Difficulty in walking, not elsewhere classified (R26.2);Pain;Muscle weakness (generalized) (M62.81) Pain - part of body:  (stomach)     Time: 1114-1130 PT Time Calculation (min) (ACUTE ONLY): 16 min  Charges:  $Therapeutic Activity: 8-22 mins                     Ginette Otto, DPT Acute Rehabilitation Services [phone]   Lucretia Field 12/01/2020, 11:39 AM

## 2020-12-01 NOTE — Plan of Care (Signed)
  Problem: Fluid Volume: Goal: Hemodynamic stability will improve Outcome: Not Progressing   Problem: Clinical Measurements: Goal: Diagnostic test results will improve Outcome: Not Progressing Goal: Signs and symptoms of infection will decrease Outcome: Not Progressing   Problem: Respiratory: Goal: Ability to maintain adequate ventilation will improve Outcome: Not Progressing   Problem: Education: Goal: Knowledge of General Education information will improve Description: Including pain rating scale, medication(s)/side effects and non-pharmacologic comfort measures Outcome: Not Progressing   Problem: Health Behavior/Discharge Planning: Goal: Ability to manage health-related needs will improve Outcome: Not Progressing   Problem: Clinical Measurements: Goal: Ability to maintain clinical measurements within normal limits will improve Outcome: Not Progressing Goal: Will remain free from infection Outcome: Not Progressing Goal: Diagnostic test results will improve Outcome: Not Progressing Goal: Respiratory complications will improve Outcome: Not Progressing Goal: Cardiovascular complication will be avoided Outcome: Not Progressing   Problem: Activity: Goal: Risk for activity intolerance will decrease Outcome: Not Progressing   Problem: Nutrition: Goal: Adequate nutrition will be maintained Outcome: Not Progressing   Problem: Elimination: Goal: Will not experience complications related to bowel motility Outcome: Not Progressing Goal: Will not experience complications related to urinary retention Outcome: Not Progressing   Problem: Pain Managment: Goal: General experience of comfort will improve Outcome: Not Progressing   Problem: Safety: Goal: Ability to remain free from injury will improve Outcome: Not Progressing   Problem: Skin Integrity: Goal: Risk for impaired skin integrity will decrease Outcome: Not Progressing

## 2020-12-01 NOTE — Progress Notes (Signed)
Progress Note    Anne CoulterLaura Bautista  WUJ:[id number] DOB: 07/03/1986  DOA: 11/15/2020 PCP: Patient, No Pcp Per    Brief Narrative:     Medical records reviewed and are as summarized below:  Anne Bautista is an 35 y.o. female with past medical history of IV drug use, tobacco use, malnutrition presented with myalgias and cough.  In the ED she was diagnosed with [facility]  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  ID/CT surgery/ orthopedics were consulted. She was transferred to hospitalist service on 11/25/2020.   Assessment/Plan:   Principal Problem:   Severe sepsis due to methicillin resistant Staphylococcus aureus (MRSA) with acute organ dysfunction (HCC) Active Problems:   IV drug abuse (HCC)   Poor dentition   Malnutrition (HCC)   Thrombocytopenia (HCC)   Tobacco dependence   Acute cystitis without hematuria   Malnutrition of moderate degree   Severe sepsis due to methicillin resistant Staphylococcus aureus (MRSA) bacteremia with acute organ dysfunction, with infectious tricuspid valve endocarditis. Septic emboli to both lungs, kidney, shoulder, and possible left ankle. -Orthopedics consulted for right shoulder and left ankle pain with concern for septic joints.  Right shoulder aspiration positive for MRSA, septic left ankle r/o by orthopedics. MRI left ankle was refused by patient. -Cardiovascular surgery following for possible angiovac debridement once sepsis has cleared. -Infectious Disease consulted for management of bacteremia and endocarditis, recommend a 6 week course of IV daptomycin -Continue IV daptomycin, pharmacy to dose with weekly CK levels on Sunday.  Acute respiratory failure, tachypnea, in setting of narcotics withdrawal superimposed on bilateral embolic pneumonias. -Not oxygen dependent at baseline, currently on 2L [place] -Wean oxygen as  able for sats > 90 % -incentive spirometry and mobilization -OOB to chair each shift  Acute blood loss anemia/iron deficiency anemia -Hgb 6.7->(1 U PRBC's given, 2/22)->8.2->6.6 (1 U PRBC's given, 2/23)->8.6->8.0 -Trend H & H  Acute cystitis without hematuria -11/15/20 U/A positive for acute cystitis with bacteria, hyaline casts, and positive nitrites Urine culture positive for MRSA with resistance to oxacillin. -Vancomycin per pharmacy completed.  Non-Oliguric AKI with ATN -Creatinine on admission 1.3,peak 2.81, trending down -Supportive care -Strict I/O's -Trend BMP -renal signed off  Hypertension -Improving, still not at goal -Continue Norvasc 10 mg daily -add clonidine which will also help with withdrawal of any-kind although at 15 days should be past  Malnutrition to a moderate degree -Albumin 1.4, per patient can swallow and feed self but needs meal set up -Per SLP, Dysphagia 2 diet with full supervision, meds crushed in puree -Continues to have poor appetite eating <10 % of meals -Continue Coretrak with Osmolite 1.5  -may needs palliative care discussion if fails to progress  GERD -Continue PPI  Chronic Constipation -Continue bowel regimen -x ray 2/28 negative for ileus or obstruction  IV drug abuse, cocaine use, marijuana use,tobacco dependence -UDSpositive for cocaine -Hepatitis C negative, HIV negative, RPR negative (11/16/20) -encourage cessation  Physical Debility -PT assessed with recommendations for SNF -OOB to chair every shift.      Family Communication/Anticipated D/C date and plan/Code Status   DVT prophylaxis:  Code Status: Full Code.  Disposition Plan: Status is: Inpatient  Remains inpatient appropriate because:IV treatments appropriate due to intensity of illness or inability to take PO   Dispo: The patient is from: Home              Anticipated  d/c is to: SNF              Patient currently is not medically stable to  d/c.   Difficult to place patient Yes    Medical Consultants:   Renal (signed off)- 2/24 CVTS: last seen 2/16: Given her clinical picture, Angiovac debridement would present a higher risk of prolonged mechanical ventillation, and potentially worsening AKI.  Additionally, her most recent cultures have been negative. Once she recovers from this septic bout, we can consider debridement. PCCM ID- last seen 2/23:   Continue Daptomycin as is, CPK 45   Follow up with CT surgery regarding timing of surgical intervention  Follow up with Ortho for left ankle pain and swelling  Fu [place] of left ankle/foot pain   Monitor CPK   Will follow intermittently  Ortho- last seen 2/22    Subjective:   C/o pain all over  Objective:    Vitals:   11/30/20 1924 11/30/20 2335 12/01/20 0328 12/01/20 0750  BP: (!) 143/88 (!) 142/88 (!) 135/99   Pulse: (!) 122 (!) 126 (!) 118 (!) 107  Resp: 20 (!) 24 (!) 22   Temp: 98.5 F (36.9 C) 98.3 F (36.8 C) 98.4 F (36.9 C) 98 F (36.7 C)  TempSrc: Oral Oral Oral Oral  SpO2: 93% 93% 94%   Weight:   74 kg   Height:        Intake/Output Summary (Last 24 hours) at 12/01/2020 1137 Last data filed at 12/01/2020 0328 Gross per 24 hour  Intake 2270.66 ml  Output 1500 ml  Net 770.66 ml   Filed Weights   11/29/20 0300 11/30/20 0500 12/01/20 0328  Weight: 76.8 kg 77.6 kg 74 kg    Exam:  General: Appearance:     Frail, chronically ill female moaning   BS diminished, tender is most quadrants  Lungs:     On [place], respirations unlabored  Heart:    Tachycardic. Normal rhythm. No murmurs, rubs, or gallops.   MS:   All extremities are intact.   Neurologic:   Awake, cooperative- able to follow commands and stops moaning when told     Data Reviewed:   I have personally reviewed following labs and imaging studies:  Labs: Labs show the following:   Basic Metabolic Panel: Recent Labs  Lab 11/25/20 0534 11/26/20 0418 11/26/20 1902 11/27/20 0430  11/27/20 2231 11/28/20 0505 11/29/20 0051 11/30/20 0637 12/01/20 0026  NA 132* 135  --  136  --  135 136 138 137  K 4.6 4.6  --  4.7  --  4.2 4.2 4.3 4.3  CL 103 104  --  104  --  105 104 102 103  CO2 19* 21*  --  21*  --  22 23 26 24   GLUCOSE 125* 101*  --  132*  --  125* 126* 120* 122*  BUN 68* 58*  --  49*  --  43* 42* 37* 35*  CREATININE 2.79* 2.81*  --  2.59*  --  2.22* 2.10* 1.77* 1.55*  CALCIUM 7.8* 7.6*  --  8.2*  --  8.0* 8.0* 8.4* 8.1*  MG 2.3 1.9  --  2.0  --  1.7  --   --   --   PHOS 7.5*  --  5.3* 6.2* 5.5* 5.4*  --   --   --    GFR Estimated Creatinine Clearance: 53.3 mL/min (A) (by C-G formula based on SCr of 1.55 mg/dL (H)). Liver Function Tests: Recent Labs  Lab 11/26/20 0418 11/27/20 0430 11/28/20 0505  AST 18 21 21   ALT 19 18 17   ALKPHOS 53 70 62  BILITOT 0.9 0.5 0.6  PROT 6.6 7.4 7.4  ALBUMIN 1.4* 1.5* 1.5*   No results for input(s): LIPASE, AMYLASE in the last 168 hours. No results for input(s): AMMONIA in the last 168 hours. Coagulation profile No results for input(s): INR, PROTIME in the last 168 hours.  CBC: Recent Labs  Lab 11/26/20 0418 11/27/20 0430 11/28/20 0505 11/29/20 0051 11/30/20 0637 12/01/20 0026  WBC 9.6 13.7* 12.5* 10.8* 10.2 9.2  NEUTROABS 7.8* 11.4* 10.2*  --   --   --   HGB 6.6* 8.6* 8.0* 8.1* 8.3* 8.1*  HCT 19.3* 26.9* 25.0* 24.7* 25.4* 26.4*  MCV 78.5* 82.3 82.8 82.3 83.6 84.3  PLT 212 220 212 208 218 215   Cardiac Enzymes: Recent Labs  Lab 11/30/20 0637  CKTOTAL 23*   BNP (last 3 results) No results for input(s): PROBNP in the last 8760 hours. CBG: Recent Labs  Lab 11/30/20 1553 11/30/20 1925 11/30/20 2339 12/01/20 0325 12/01/20 0748  GLUCAP 113* 108* 124* 105* 121*   D-Dimer: No results for input(s): DDIMER in the last 72 hours. Hgb A1c: No results for input(s): HGBA1C in the last 72 hours. Lipid Profile: No results for input(s): CHOL, HDL, LDLCALC, TRIG, CHOLHDL, LDLDIRECT in the last 72  hours. Thyroid function studies: No results for input(s): TSH, T4TOTAL, T3FREE, THYROIDAB in the last 72 hours.  Invalid input(s): FREET3 Anemia work up: No results for input(s): VITAMINB12, FOLATE, FERRITIN, TIBC, IRON, RETICCTPCT in the last 72 hours. Sepsis Labs: Recent Labs  Lab 11/25/20 0817 11/26/20 0418 11/27/20 0430 11/27/20 1110 11/28/20 0505 11/29/20 0051 11/30/20 0637 12/01/20 0026  PROCALCITON 0.86 0.81  --  0.64 0.66  --   --   --   WBC  --  9.6   < >  --  12.5* 10.8* 10.2 9.2   < > = values in this interval not displayed.    Microbiology No results found for this or any previous visit (from the past 240 hour(s)).  Procedures and diagnostic studies:  DG Abd Portable 1V  Result Date: 12/01/2020 CLINICAL DATA:  Abdominal pain. EXAM: PORTABLE ABDOMEN - 1 VIEW COMPARISON:  CT of the abdomen and pelvis on 11/15/2020 FINDINGS: No evidence of bowel obstruction, ileus or signs of free air. No abnormal calcifications. Feeding tube is present with the tip located in the expected position of the distal stomach/proximal duodenum. IMPRESSION: No acute findings. The tip of the feeding tube is in the expected position of the distal stomach/proximal duodenum. Electronically Signed   By: 12/03/2020 M.D.   On: 12/01/2020 09:18    Medications:   . amLODipine  10 mg Oral Daily  . chlorhexidine  15 mL Mouth Rinse BID  . Chlorhexidine Gluconate Cloth  6 each Topical Daily  . dicyclomine  20 mg Oral TID AC & HS  . docusate sodium  100 mg Oral BID  . feeding supplement  237 mL Oral TID BM  . feeding supplement (PROSource TF)  45 mL Per Tube TID  . folic acid  1 mg Oral Daily  . heparin  5,000 Units Subcutaneous Q8H  . mouth rinse  15 mL Mouth Rinse q12n4p  . methadone  10 mg Oral Q8H  . multivitamin with minerals  1 tablet Oral Daily  . pantoprazole  40 mg Oral Daily  . polyethylene glycol  17 g Oral BID  .  QUEtiapine  25 mg Oral BID  . senna  2 tablet Oral Daily  .  sodium chloride flush  10-40 mL Intracatheter Q12H  . sodium chloride flush  3 mL Intravenous Q12H  . thiamine  100 mg Oral Daily   Continuous Infusions: . sodium chloride Stopped (11/23/20 2116)  . DAPTOmycin (CUBICIN)  IV 764 mg (11/30/20 1936)  . feeding supplement (OSMOLITE 1.5 CAL) 1,000 mL (12/01/20 0241)     LOS: 15 days   Joseph Art  [facility]   How to contact the [facility] Attending or Consulting provider 7A - 7P or covering provider during after hours 7P -7A, for this patient?  1. Check the care team in [facility] and look for a) attending/consulting TR[facility] provider listed and b) the [facility] team listed 2. Log into [email or web address] and use [place]'s universal password to access. If you do not have the password, please contact the hospital operator. 3. Locate the [facility] provider you are looking for under [facility] and page to a number that you can be directly reached. 4. If you still have difficulty reaching the provider, please page the [facility] (Director on Call) for the Hospitalists listed on amion for assistance.  12/01/2020, 11:37 AM

## 2020-12-01 NOTE — Progress Notes (Signed)
Occupational Therapy Treatment Patient Details Name: Anne Bautista MRN: [id number] DOB: 1986/06/24 Today's Date: 12/01/2020    History of present illness 35 yo admitted 2/14 with sepsis due to MRSA bacteremia in setting of IVDU. Rt shoulder pain s/p aspiration 2/14. PMHx: IVDU, malnutrition, poor dentition   OT comments  Pt making progress towards OT goals this session. Goals updated this session. Pt is able to make transfers at mod A, significant assist needed for bathing/dressing. Pt more talkative this session than previous. Answering questions appropriately. Pt with continued significant decrease ROM for R shoulder which impacts her ability to perform ADL. POC remains appropriate and Pt will continue to require SNF post-acute. Pt continues to be limited by lethargy and pain.    Follow Up Recommendations  SNF;Supervision/Assistance - 24 hour    Equipment Recommendations  None recommended by OT    Recommendations for Other Services      Precautions / Restrictions Precautions Precautions: Fall Precaution Comments: watch sats/RR Restrictions Weight Bearing Restrictions: No       Mobility Bed Mobility Overal bed mobility: Needs Assistance Bed Mobility: Supine to Sit     Supine to sit: Mod assist;HOB elevated     General bed mobility comments: increased time and effort to come to EoB, maximal encouragement and one step commands for sequencing, mod A for bringing trunk to upright, pt able to scoot to EOB wtihout assist    Transfers Overall transfer level: Needs assistance Equipment used: Rolling walker (2 wheeled) Transfers: Sit to/from Stand Sit to Stand: Mod assist         General transfer comment: mod A for power up to RW, increased moaning    Balance Overall balance assessment: Needs assistance Sitting-balance support: Bilateral upper extremity supported;Feet supported Sitting balance-Leahy Scale: Fair Sitting balance - Comments: minguard for static sitting  balance   Standing balance support: Bilateral upper extremity supported Standing balance-Leahy Scale: Poor Standing balance comment: reliant on trunk and UE support                           ADL either performed or assessed with clinical judgement   ADL Overall ADL's : Needs assistance/impaired     Grooming: Wash/dry hands;Wash/dry face;Oral care;Applying deodorant;Min guard;Sitting;Cueing for sequencing Grooming Details (indicate cue type and reason): in recliner, cues to initiate each task Upper Body Bathing: Moderate assistance;Sitting Upper Body Bathing Details (indicate cue type and reason): for back Lower Body Bathing: Moderate assistance;Sit to/from stand Lower Body Bathing Details (indicate cue type and reason): Pt able to complete LB bathing with assist for sit<>stand for cleaning peri area Upper Body Dressing : Moderate assistance;Sitting Upper Body Dressing Details (indicate cue type and reason): to change into clean gown Lower Body Dressing: Maximal assistance Lower Body Dressing Details (indicate cue type and reason): socks Toilet Transfer: Moderate assistance;Ambulation;RW Toilet Transfer Details (indicate cue type and reason): mod A for initial boost Toileting- Clothing Manipulation and Hygiene: Total assistance Toileting - Clothing Manipulation Details (indicate cue type and reason): purewick in and briefs     Functional mobility during ADLs: Minimal assistance;Cueing for safety;Cueing for sequencing;Rolling walker General ADL Comments: Pt limited by generalized discomfort, self-limiting, constant moaning/whimpering     Vision       Perception     Praxis      Cognition Arousal/Alertness: Lethargic Behavior During Therapy: Flat affect Overall Cognitive Status: Impaired/Different from baseline Area of Impairment: Attention;Following commands;Safety/judgement;Awareness;Problem solving  Current Attention Level: Sustained    Following Commands: Follows one step commands with increased time Safety/Judgement: Decreased awareness of safety;Decreased awareness of deficits Awareness: Intellectual Problem Solving: Slow processing;Decreased initiation;Difficulty sequencing;Requires verbal cues;Requires tactile cues General Comments: increased communication this session, but continues with constant whimpering/moaning        Exercises     Shoulder Instructions       General Comments HR 102-125 with activity    Pertinent Vitals/ Pain       Pain Assessment: Faces Faces Pain Scale: Hurts even more Pain Location: pt whimpering/moaning throughout session. pt states her stomach is hurting Pain Descriptors / Indicators: Discomfort;Grimacing Pain Intervention(s): Limited activity within patient's tolerance;Monitored during session;Repositioned;RN gave pain meds during session  Home Living                                          Prior Functioning/Environment              Frequency  Min 2X/week        Progress Toward Goals  OT Goals(current goals can now be found in the care plan section)  Progress towards OT goals: Progressing toward goals  Acute Rehab OT Goals Patient Stated Goal: to decrease pain OT Goal Formulation: With patient Time For Goal Achievement: 12/03/20 Potential to Achieve Goals: Good  Plan Discharge plan remains appropriate;Frequency remains appropriate    Co-evaluation    PT/OT/SLP Co-Evaluation/Treatment: Yes Reason for Co-Treatment: Necessary to address cognition/behavior during functional activity;For patient/therapist safety;To address functional/ADL transfers PT goals addressed during session: Mobility/safety with mobility OT goals addressed during session: ADL's and self-care      AM-PAC OT "6 Clicks" Daily Activity     Outcome Measure   Help from another person eating meals?: A Little Help from another person taking care of personal grooming?: A  Lot Help from another person toileting, which includes using toliet, bedpan, or urinal?: A Lot Help from another person bathing (including washing, rinsing, drying)?: A Lot Help from another person to put on and taking off regular upper body clothing?: A Lot Help from another person to put on and taking off regular lower body clothing?: Total 6 Click Score: 12    End of Session Equipment Utilized During Treatment: Gait belt;Rolling walker;Oxygen  OT Visit Diagnosis: Unsteadiness on feet (R26.81);Muscle weakness (generalized) (M62.81);Other symptoms and signs involving cognitive function;Pain Pain - Right/Left: Left Pain - part of body: Ankle and joints of foot   Activity Tolerance Patient limited by lethargy   Patient Left in chair;with call bell/phone within reach;with chair alarm set   Nurse Communication Mobility status        Time: 5974-1638 OT Time Calculation (min): 38 min  Charges: OT General Charges $OT Visit: 1 Visit OT Treatments $Self Care/Home Management : 23-37 mins  Nyoka Cowden OTR/L Acute Rehabilitation Services Pager: [phone] Office: [phone]   Briceyda Abdullah Ortha Metts 12/01/2020, 3:51 PM

## 2020-12-01 NOTE — Progress Notes (Signed)
[facility]  Date of Admission:  11/15/2020      Total days of antibiotics 15   Daptomycin            ASSESSMENT: Anne Bautista is a 35 y.o. female with history of IVDU and MRSA bacteremia complicated by tricuspid valve endocarditis (unable to be angiovac'd) and severe pulmonary septic emboli and R shoulder septic arthritis.   Blood stream well cleared - OK to place PICC in left arm for access to complete prolonged course of antibiotics. Will continue daptomycin.   R shoulder range of motion is significantly limited. Would have orthopedic team come back to see her given culture growth of MRSA and concern for progression of osteomyelitis or other infectious pathology to the joint that would warrant consideration of surgery. Discussed possible MRI but not sure she would be able to remain still for this.   Left foot - appears significantly better than last week. Not painful or tender. Does not seem to bother her with ambulation.     PLAN: 1. Continue daptomycin  2. Follow for orthopedic re-evaluation - very concerned her shoulder infection has progressed and needs surgical intervention  3. OK to place PICC line 4. Follow CK weekly (stable)    Principal Problem:   Severe sepsis due to methicillin resistant Staphylococcus aureus (MRSA) with acute organ dysfunction (HCC) Active Problems:   IV drug abuse (HCC)   Poor dentition   Malnutrition (HCC)   Thrombocytopenia (HCC)   Tobacco dependence   Acute cystitis without hematuria   Malnutrition of moderate degree   . amLODipine  10 mg Oral Daily  . chlorhexidine  15 mL Mouth Rinse BID  . Chlorhexidine Gluconate Cloth  6 each Topical Daily  . cloNIDine  0.1 mg Oral BID  . dicyclomine  20 mg Oral TID AC & HS  . docusate sodium  100 mg Oral BID  . feeding supplement  237 mL Oral TID BM  . feeding supplement (PROSource TF)  45 mL Per Tube TID  . folic acid  1 mg Oral Daily  . heparin  5,000  Units Subcutaneous Q8H  . mouth rinse  15 mL Mouth Rinse q12n4p  . methadone  10 mg Oral Q8H  . multivitamin with minerals  1 tablet Oral Daily  . pantoprazole  40 mg Oral Daily  . polyethylene glycol  17 g Oral BID  . QUEtiapine  25 mg Oral BID  . senna  2 tablet Oral Daily  . sodium chloride flush  10-40 mL Intracatheter Q12H  . sodium chloride flush  3 mL Intravenous Q12H  . thiamine  100 mg Oral Daily    SUBJECTIVE: "pain all over" but really cannot raise shoulder up beyond 45 degrees.  No new pain. Her left foot feels much better.   Review of Systems: Review of Systems  Constitutional: Negative for fever.  Respiratory: Negative for cough and shortness of breath.   Cardiovascular: Negative for chest pain and leg swelling.  Musculoskeletal: Positive for joint pain (R shoulder ). Negative for back pain.  Skin: Negative for itching and rash.    No Known Allergies  OBJECTIVE: Vitals:   11/30/20 2335 12/01/20 0328 12/01/20 0750 12/01/20 1204  BP: (!) 142/88 (!) 135/99  117/89  Pulse: (!) 126 (!) 118 (!) 107 (!) 120  Resp: (!) 24 (!) 22  (!) 25  Temp: 98.3 F (36.8 C) 98.4 F (36.9 C) 98 F (36.7 C)  97.8 F (36.6 C)  TempSrc: Oral Oral Oral Oral  SpO2: 93% 94%    Weight:  74 kg    Height:       Body mass index is 25.55 kg/m.  Physical Exam Constitutional:      Comments: Sitting up in bed trying to swallow pills. Moaning.   HENT:     Mouth/Throat:     Mouth: Mucous membranes are moist.     Pharynx: Oropharynx is clear.  Cardiovascular:     Rate and Rhythm: Regular rhythm. Tachycardia present.  Pulmonary:     Effort: Pulmonary effort is normal.     Breath sounds: Normal breath sounds.  Abdominal:     General: Bowel sounds are normal.     Palpations: Abdomen is soft.  Musculoskeletal:     Comments: R shoulder active ROM only reveals she can abduct to maybe ~45 degrees. Not overtly painful to the touch but significantly limited ROM.   Neurological:      Mental Status: She is alert.     Lab Results Lab Results  Component Value Date   WBC 9.2 12/01/2020   HGB 8.1 (L) 12/01/2020   HCT 26.4 (L) 12/01/2020   MCV 84.3 12/01/2020   PLT 215 12/01/2020    Lab Results  Component Value Date   CREATININE 1.55 (H) 12/01/2020   BUN 35 (H) 12/01/2020   NA 137 12/01/2020   K 4.3 12/01/2020   CL 103 12/01/2020   CO2 24 12/01/2020    Lab Results  Component Value Date   ALT 17 11/28/2020   AST 21 11/28/2020   ALKPHOS 62 11/28/2020   BILITOT 0.6 11/28/2020     Microbiology: No results found for this or any previous visit (from the past 240 hour(s)).   Rexene Alberts, MSN, NP-C [facility]  [email or web address] Pager: [phone] Office: [phone] [facility] Main Line: [phone]

## 2020-12-01 NOTE — Plan of Care (Signed)
  Problem: Clinical Measurements: Goal: Diagnostic test results will improve Outcome: Progressing Goal: Signs and symptoms of infection will decrease Outcome: Progressing   Problem: Respiratory: Goal: Ability to maintain adequate ventilation will improve Outcome: Progressing   

## 2020-12-01 NOTE — Progress Notes (Signed)
Nutrition Follow-up  DOCUMENTATION CODES:   Non-severe (moderate) malnutrition in context of social or environmental circumstances  INTERVENTION:   Transition to nocturnal feeding:  -Osmolite 1.5 @ 90 ml/hr x 14 hrs (1800-0800)  -ProSource TF 45 ml TID  Provides: 2010 kcals, 112 grams protein, 960 ml free water. Meets 96% kcal and 97% protein needs.    Ensure Enlive po BID, each supplement provides 350 kcal and 20 grams of protein  Magic cup BID with meals, each supplement provides 290 kcal and 9 grams of protein  NUTRITION DIAGNOSIS:   Moderate Malnutrition related to social / environmental circumstances (IVDU) as evidenced by mild fat depletion,moderate fat depletion,mild muscle depletion,moderate muscle depletion.  Ongoing.  GOAL:   Patient will meet greater than or equal to 90% of their needs  Progressing.  MONITOR:   PO intake,Supplement acceptance,Labs,Weight trends,Skin,I & O's  REASON FOR ASSESSMENT:   Consult Assessment of nutrition requirement/status  ASSESSMENT:   83 YOF admitted for severe sepsis w/ MRSA acute organ dysfunction. Septic emboli to both lungs, kidneys, R should, L ankle. Hx of IVDU, acute resp failure, tachypnea, AKI, cellulitis on L ankle, chronic constipation.  02/20 - SLP eval, recommendation for full liquid diet  02/23 - SLP eval, Dysphagia 1 diet with thin liquids 02/26 - diet advanced to dysphagia 2, thin liquids   Meal completions charted as 0-30% since diet advancement. Tolerating Osmolite 1.5 @ 55 ml/hr. RN to encourage meal and supplement intake. Transition to nocturnal feedings to promote intake during the day. May decrease rate as PO intake increases.   Admission weight: 71.5 kg  Current weight: 74 kg   UOP: 1700 ml x 24 hrs   Medications: colace, folic acid, miralax, senokot, thiamine  Labs: Cr 1.55- trending down CBG 105-122  Diet Order:   Diet Order            DIET DYS 2 Room service appropriate? No; Fluid  consistency: Thin  Diet effective now                 EDUCATION NEEDS:   No education needs have been identified at this time  Skin:  Skin Assessment: Reviewed RN Assessment  Last BM:  2/27  Height:   Ht Readings from Last 1 Encounters:  11/16/20 5\' 7"  (1.702 m)    Weight:   Wt Readings from Last 1 Encounters:  12/01/20 74 kg    Ideal Body Weight:  61.4 kg  BMI:  Body mass index is 25.55 kg/m.  Estimated Nutritional Needs:   Kcal:  2100-2300  Protein:  115-130 grams  Fluid:  > 2 L  12/03/20 RD, LDN Clinical Nutrition Pager listed in AMION

## 2020-12-02 ENCOUNTER — Encounter (HOSPITAL_COMMUNITY): Payer: Self-pay | Admitting: Certified Registered Nurse Anesthetist

## 2020-12-02 ENCOUNTER — Encounter (HOSPITAL_COMMUNITY)
Admission: EM | Discharge status: Against Medical Advice | Payer: Self-pay | Source: Home / Self Care | Attending: Internal Medicine

## 2020-12-02 DIAGNOSIS — E440 Moderate protein-calorie malnutrition: Secondary | ICD-10-CM

## 2020-12-02 DIAGNOSIS — J9621 Acute and chronic respiratory failure with hypoxia: Secondary | ICD-10-CM

## 2020-12-02 LAB — CBC
HCT: 22.6 % — ABNORMAL LOW (ref 36.0–46.0)
Hemoglobin: 7.1 g/dL — ABNORMAL LOW (ref 12.0–15.0)
MCH: 26.4 pg (ref 26.0–34.0)
MCHC: 31.4 g/dL (ref 30.0–36.0)
MCV: 84 fL (ref 80.0–100.0)
Platelets: 204 10*3/uL (ref 150–400)
RBC: 2.69 MIL/uL — ABNORMAL LOW (ref 3.87–5.11)
RDW: 18.4 % — ABNORMAL HIGH (ref 11.5–15.5)
WBC: 8.3 10*3/uL (ref 4.0–10.5)
nRBC: 0 % (ref 0.0–0.2)

## 2020-12-02 LAB — COMPREHENSIVE METABOLIC PANEL
ALT: 29 U/L (ref 0–44)
AST: 41 U/L (ref 15–41)
Albumin: 1.6 g/dL — ABNORMAL LOW (ref 3.5–5.0)
Alkaline Phosphatase: 98 U/L (ref 38–126)
Anion gap: 9 (ref 5–15)
BUN: 35 mg/dL — ABNORMAL HIGH (ref 6–20)
CO2: 25 mmol/L (ref 22–32)
Calcium: 8.1 mg/dL — ABNORMAL LOW (ref 8.9–10.3)
Chloride: 102 mmol/L (ref 98–111)
Creatinine, Ser: 1.64 mg/dL — ABNORMAL HIGH (ref 0.44–1.00)
GFR, Estimated: 42 mL/min — ABNORMAL LOW (ref >60)
Glucose, Bld: 120 mg/dL — ABNORMAL HIGH (ref 70–99)
Potassium: 4.2 mmol/L (ref 3.5–5.1)
Sodium: 136 mmol/L (ref 135–145)
Total Bilirubin: 0.6 mg/dL (ref 0.3–1.2)
Total Protein: 7.8 g/dL (ref 6.5–8.1)

## 2020-12-02 LAB — GLUCOSE, CAPILLARY
Glucose-Capillary: 107 mg/dL — ABNORMAL HIGH (ref 70–99)
Glucose-Capillary: 109 mg/dL — ABNORMAL HIGH (ref 70–99)
Glucose-Capillary: 122 mg/dL — ABNORMAL HIGH (ref 70–99)
Glucose-Capillary: 74 mg/dL (ref 70–99)
Glucose-Capillary: 86 mg/dL (ref 70–99)
Glucose-Capillary: 92 mg/dL (ref 70–99)

## 2020-12-02 SURGERY — MRI WITH ANESTHESIA
Anesthesia: General | Laterality: Right

## 2020-12-02 MED ORDER — MAGNESIUM SULFATE 2 GM/50ML IV SOLN
2.0000 g | Freq: Once | INTRAVENOUS | Status: AC
  Administered 2020-12-02: 2 g via INTRAVENOUS
  Filled 2020-12-02: qty 50

## 2020-12-02 NOTE — Progress Notes (Signed)
[facility]  Date of Admission:  11/15/2020      Total days of antibiotics 16   Daptomycin            ASSESSMENT: Anne Bautista is a 35 y.o. 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   Septic emboli to lungs a/w right sided endocarditis -   Orthopedic surgery team to reassess her R shoulder - MRI ordered to assess for effusion/osteomyelitis/septic arthritis.   L Foot - continues to improve. No tenderness or trouble with weightbearing on this leg.    Overall malnourished state still dependent on tube feeds - recommending SNF for discharge.      PLAN: 1. Continue Daptomycin  2. Follow MRI results - appreciate orthopedics coming back to see her    Principal Problem:   Severe sepsis due to methicillin resistant Staphylococcus aureus (MRSA) with acute organ dysfunction (HCC) Active Problems:   IV drug abuse (HCC)   Poor dentition   Malnutrition (HCC)   Thrombocytopenia (HCC)   Tobacco dependence   Acute cystitis without hematuria   Malnutrition of moderate degree   Abdominal pain   Left foot pain   Septic embolism (HCC)   Endocarditis of tricuspid valve   Staphylococcal arthritis of right shoulder (HCC)   MRSA bacteremia   . amLODipine  10 mg Oral Daily  . chlorhexidine  15 mL Mouth Rinse BID  . Chlorhexidine Gluconate Cloth  6 each Topical Daily  . cloNIDine  0.1 mg Oral BID  . dicyclomine  20 mg Oral TID AC & HS  . docusate sodium  100 mg Oral BID  . feeding supplement  237 mL Oral TID BM  . feeding supplement (PROSource TF)  45 mL Per Tube TID  . folic acid  1 mg Oral Daily  . heparin  5,000 Units Subcutaneous Q8H  . mouth rinse  15 mL Mouth Rinse q12n4p  . methadone  10 mg Oral Q8H  . multivitamin with minerals  1 tablet Oral Daily  . pantoprazole  40 mg Oral Daily  .  polyethylene glycol  17 g Oral BID  . QUEtiapine  25 mg Oral BID  . senna  2 tablet Oral Daily  . sodium chloride flush  10-40 mL Intracatheter Q12H  . sodium chloride flush  10-40 mL Intracatheter Q12H  . sodium chloride flush  3 mL Intravenous Q12H  . thiamine  100 mg Oral Daily    SUBJECTIVE: "Pain all over" - R shoulder continues to be limiting for ADLs. Well documented as well during PT sessions.  Fever last night to 100.4 F    Review of Systems: Review of Systems  Constitutional: Negative for fever.  Respiratory: Negative for cough and shortness of breath.   Cardiovascular: Negative for chest pain and leg swelling.  Musculoskeletal: Positive for joint pain (R shoulder ). Negative for back pain.  Skin: Negative for itching and rash.    No Known Allergies  OBJECTIVE: Vitals:   12/01/20 2300 12/02/20 0300 12/02/20 0813 12/02/20 1058  BP: 118/70 127/83 (!) 168/88 (!) 156/79  Pulse: (!) 108 (!) 109 (!) 118 (!) 114  Resp: (!) 22 (!) 24 20 20   Temp: 98.7 F (37.1 C) 99.1 F (37.3 C) 99.1 F (37.3 C) 98.7 F (37.1 C)  TempSrc: Oral Oral Oral Oral  SpO2: 96% 93%    Weight:  75.2 kg    Height:       Body mass index is 25.97 kg/m.  Physical Exam Constitutional:      Comments: Sitting up in bed trying to swallow pills. Moaning.   HENT:     Mouth/Throat:     Mouth: Mucous membranes are moist.     Pharynx: Oropharynx is clear.  Cardiovascular:     Rate and Rhythm: Regular rhythm. Tachycardia present.  Pulmonary:     Effort: Pulmonary effort is normal.     Breath sounds: Normal breath sounds.  Abdominal:     General: Bowel sounds are normal.     Palpations: Abdomen is soft.  Musculoskeletal:     Comments: R shoulder active ROM only reveals she can abduct to maybe ~45 degrees. Not overtly painful to the touch but significantly limited ROM.   Skin:    Capillary Refill: Capillary refill takes less than 2 seconds.  Neurological:     Mental Status: She is alert.      Lab Results Lab Results  Component Value Date   WBC 8.3 12/02/2020   HGB 7.1 (L) 12/02/2020   HCT 22.6 (L) 12/02/2020   MCV 84.0 12/02/2020   PLT 204 12/02/2020    Lab Results  Component Value Date   CREATININE 1.64 (H) 12/02/2020   BUN 35 (H) 12/02/2020   NA 136 12/02/2020   K 4.2 12/02/2020   CL 102 12/02/2020   CO2 25 12/02/2020    Lab Results  Component Value Date   ALT 29 12/02/2020   AST 41 12/02/2020   ALKPHOS 98 12/02/2020   BILITOT 0.6 12/02/2020     Microbiology: No results found for this or any previous visit (from the past 240 hour(s)).   Rexene Alberts, MSN, NP-C [facility]  [email or web address] Pager: [phone] Office: [phone] [facility] Main Line: [phone]

## 2020-12-02 NOTE — Progress Notes (Signed)
     Anne Bautista is a 35 y.o. female   Orthopaedic diagnosis:  Right shoulder pain.    Subjective: Patient complains of pain "all over."  She states her right shoulder is painful.  She states she has had pain for about 4 weeks in her right shoulder.  She states that she had a cyst within her right axilla that she decompressed on her own and when asked to indicate the point of maximal pain she indicates her axilla.  Her left ankle and foot are not bothering her.  She previously had pain in these areas but that has improved.   Objectyive: Vitals:   12/02/20 0300 12/02/20 0813  BP: 127/83 (!) 168/88  Pulse: (!) 109 (!) 118  Resp: (!) 24 20  Temp: 99.1 F (37.3 C) 99.1 F (37.3 C)  SpO2: 93%      Exam: Awake and alert Respirations even and unlabored No acute distress  Right shoulder demonstrates no swelling.  There is mild bruising laterally at the site of the aspiration.  There is no obvious abscess or mass within the axilla.  Patient is seen moving about her shoulder without indication of difficulty or pain during questioning.  Upon initiation of exam patient guards her shoulder. Patient tolerates abduction to 90 degrees actively as well as forward elevation to 110 degrees actively.  She had no pain with passive internal or external rotation about the shoulder with normal limits of motion.  With muscle strength testing of the rotator cuff she indicates discomfort, but has relatively well-maintained strength despite guarding.  She has no areas of tenderness to palpation along the trapezius muscle or deltoid muscle.  No tenderness palpation along the scapula posteriorly or anteriorly.  On palpation she states it "feels good".  Left ankle demonstrates mild swelling but no erythema.  She tolerates passive and active range of motion about the ankle, toes and midfoot region.  She denies tenderness to palpation.  Assessment: Right shoulder pain in the setting of sepsis and tricuspid valve  endocarditis. She had aspiration performed previously with rare MRSA on culture but negative Gram stain, concerning for contaminant given bacteremia and exam inconsistent with septic arthritis.   Plan: The patient tolerates active and passive shoulder range of motion without indication of pain today on detailed shoulder exam.  She does complain of shoulder pain but her exam is not consistent with septic arthritis.  Given the history of cyst within her axilla, per her report, I do feel like advanced imaging with an MRI scan is warranted at this time.  I discussed this with the patient and she seemed agreeable to this.  Based on MRI findings we will make treatment recommendations.  If the MRI shows a large shoulder joint effusion with concern for septic arthritis then we would plan for incision and drainage of her joint.  Her hemoglobin is 7.1 today she would likely require transfusion prior to any surgical intervention.  She will likely require sedation for her MRI scan.  This would be done without contrast due to decreased kidney function.  MRI ordered.    Nicki [place], MD

## 2020-12-02 NOTE — Progress Notes (Signed)
Progress Note    Anne Bautista  EGB:[id number] DOB: Mar 24, 1986  DOA: 11/15/2020 PCP: Patient, No Pcp Per    Brief Narrative:     Medical records reviewed and are as summarized below:  Anne Bautista is an 35 y.o. female with past medical history of IV drug use, tobacco use, malnutrition presented with myalgias and cough.  In the ED she was diagnosed with sepsis and was transferred to [facility].  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  ID/CT surgery/ orthopedics were consulted. She was transferred to hospitalist service on 11/25/2020. Needs 6 weeks of IV abx.      Assessment/Plan:   Principal Problem:   Severe sepsis due to methicillin resistant Staphylococcus aureus (MRSA) with acute organ dysfunction (HCC) Active Problems:   IV drug abuse (HCC)   Poor dentition   Malnutrition (HCC)   Thrombocytopenia (HCC)   Tobacco dependence   Acute cystitis without hematuria   Malnutrition of moderate degree   Abdominal pain   Left foot pain   Septic embolism (HCC)   Endocarditis of tricuspid valve   Staphylococcal arthritis of right shoulder (HCC)   MRSA bacteremia    Severe sepsis due to methicillin resistant Staphylococcus aureus (MRSA) bacteremia with acute organ dysfunction, with infectious tricuspid valve endocarditis. Septic emboli to both lungs, kidney, shoulder, and possible left ankle. -Orthopedics consulted for right shoulder and left ankle pain with concern for septic joints.  Right shoulder aspiration positive for MRSA, septic left ankle r/o by orthopedics. MRI left ankle was refused by patient. -Cardiovascular surgery following for possible angiovac debridement once sepsis has cleared. -Infectious Disease consulted for management of bacteremia and endocarditis, recommend a 6 week course of IV daptomycin -Continue IV daptomycin, pharmacy to dose with weekly CK levels on  Sunday. -PICC line placed: 2/28 -ortho re-consulted for eval of right shoulder: plan for MRI with sedation  Acute respiratory failure, tachypnea, in setting of narcotics withdrawal superimposed on bilateral embolic pneumonias. -Not oxygen dependent at baseline, currently on 2L  -Wean oxygen as able for sats > 90 % -incentive spirometry and mobilization -OOB to chair each shift  Acute blood loss anemia/iron deficiency anemia -Hgb 6.7->(1 U PRBC's given, 2/22)->8.2->6.6 (1 U PRBC's given, 2/23)->8.6->8.0 -Trend H & H- transfuse for <7  Acute cystitis without hematuria -11/15/20 U/A positive for acute cystitis with bacteria, hyaline casts, and positive nitrites Urine culture positive for MRSA with resistance to oxacillin. -Vancomycin per pharmacy completed.  Non-Oliguric AKI with ATN -Creatinine on admission 1.3,peak 2.81, stable -Supportive care -Strict I/O's -Trend BMP -renal signed off  Hypertension -Improving, still not at goal -Continue Norvasc 10 mg daily -add clonidine which will also help with withdrawal of any-kind although at 15 days should be past  Malnutrition to a moderate degree -Albumin 1.4, per patient can swallow and feed self but needs meal set up -Per SLP, Dysphagia 2 diet with full supervision, meds crushed in puree -Continues to have poor appetite eating <10 % of meals -Continue Coretrak with Osmolite 1.5  -may need palliative care discussion if fails to progress  GERD -Continue PPI  Chronic Constipation -Continue bowel regimen -x ray 2/28 negative for ileus or obstruction  IV drug abuse, cocaine use, marijuana use,tobacco dependence -UDSpositive for cocaine -Hepatitis C negative, HIV negative, RPR negative (11/16/20) -encourage cessation  Physical Debility -PT assessed with recommendations for SNF -OOB to chair every shift.      Family  Communication/Anticipated D/C date and plan/Code Status   DVT prophylaxis:  heparin Code Status: Full Code.  Disposition Plan: Status is: Inpatient  Remains inpatient appropriate because:IV treatments appropriate due to intensity of illness or inability to take PO   Dispo: The patient is from: Home              Anticipated d/c is to: SNF              Patient currently is not medically stable to d/c.   Difficult to place patient Yes    Medical Consultants:   Renal (signed off)- 2/24 CVTS: last seen 2/16: Given her clinical picture, Angiovac debridement would present a higher risk of prolonged mechanical ventillation, and potentially worsening AKI.  Additionally, her most recent cultures have been negative. Once she recovers from this septic bout, we can consider debridement. PCCM ID- last seen 2/23:   Continue Daptomycin as is, CPK 45   Follow up with CT surgery regarding timing of surgical intervention  Follow up with Ortho for left ankle pain and swelling  Fu US of left ankle/foot pain   Monitor CPK   Will follow intermittently  Ortho- last seen 2/22    Subjective:   Abdominal pain better with bentyl and asking for sprite to drink  Objective:    Vitals:   12/01/20 2300 12/02/20 0300 12/02/20 0813 12/02/20 1058  BP: 118/70 127/83 (!) 168/88 (!) 156/79  Pulse: (!) 108 (!) 109 (!) 118 (!) 114  Resp: (!) 22 (!) 24 20 20   Temp: 98.7 F (37.1 C) 99.1 F (37.3 C) 99.1 F (37.3 C) 98.7 F (37.1 C)  TempSrc: Oral Oral Oral Oral  SpO2: 96% 93%    Weight:  75.2 kg    Height:        Intake/Output Summary (Last 24 hours) at 12/02/2020 1102 Last data filed at 12/02/2020 0900 Gross per 24 hour  Intake 1331.5 ml  Output 1900 ml  Net -568.5 ml   Filed Weights   11/30/20 0500 12/01/20 0328 12/02/20 0300  Weight: 77.6 kg 74 kg 75.2 kg    Exam:  General: Appearance:     Overweight female in no acute distress     Lungs:     Poor effort, respirations unlabored  Heart:    Tachycardic. Normal rhythm. No murmurs, rubs, or gallops.   MS:   All  extremities are intact.   Neurologic:   Sleeping peacefully , but when awoke will moan     Data Reviewed:   I have personally reviewed following labs and imaging studies:  Labs: Labs show the following:   Basic Metabolic Panel: Recent Labs  Lab 11/26/20 0418 11/26/20 1902 11/27/20 0430 11/27/20 2231 11/28/20 0505 11/29/20 0051 11/30/20 0637 12/01/20 0026 12/02/20 0150  NA 135  --  136  --  135 136 138 137 136  K 4.6  --  4.7  --  4.2 4.2 4.3 4.3 4.2  CL 104  --  104  --  105 104 102 103 102  CO2 21*  --  21*  --  22 23 26 24 25   GLUCOSE 101*  --  132*  --  125* 126* 120* 122* 120*  BUN 58*  --  49*  --  43* 42* 37* 35* 35*  CREATININE 2.81*  --  2.59*  --  2.22* 2.10* 1.77* 1.55* 1.64*  CALCIUM 7.6*  --  8.2*  --  8.0* 8.0* 8.4* 8.1* 8.1*  MG 1.9  --  2.0  --  1.7  --   --   --   --   PHOS  --  5.3* 6.2* 5.5* 5.4*  --   --   --   --    GFR Estimated Creatinine Clearance: 50.6 mL/min (A) (by C-G formula based on SCr of 1.64 mg/dL (H)). Liver Function Tests: Recent Labs  Lab 11/26/20 0418 11/27/20 0430 11/28/20 0505 12/02/20 0150  AST 18 21 21  41  ALT 19 18 17 29   ALKPHOS 53 70 62 98  BILITOT 0.9 0.5 0.6 0.6  PROT 6.6 7.4 7.4 7.8  ALBUMIN 1.4* 1.5* 1.5* 1.6*   No results for input(s): LIPASE, AMYLASE in the last 168 hours. No results for input(s): AMMONIA in the last 168 hours. Coagulation profile No results for input(s): INR, PROTIME in the last 168 hours.  CBC: Recent Labs  Lab 11/26/20 0418 11/27/20 0430 11/28/20 0505 11/29/20 0051 11/30/20 0637 12/01/20 0026 12/02/20 0150  WBC 9.6 13.7* 12.5* 10.8* 10.2 9.2 8.3  NEUTROABS 7.8* 11.4* 10.2*  --   --   --   --   HGB 6.6* 8.6* 8.0* 8.1* 8.3* 8.1* 7.1*  HCT 19.3* 26.9* 25.0* 24.7* 25.4* 26.4* 22.6*  MCV 78.5* 82.3 82.8 82.3 83.6 84.3 84.0  PLT 212 220 212 208 218 215 204   Cardiac Enzymes: Recent Labs  Lab 11/30/20 0637  CKTOTAL 23*   BNP (last 3 results) No results for input(s): PROBNP in  the last 8760 hours. CBG: Recent Labs  Lab 12/01/20 1955 12/01/20 2357 12/02/20 0338 12/02/20 0811 12/02/20 1059  GLUCAP 121* 113* 109* 122* 92   D-Dimer: No results for input(s): DDIMER in the last 72 hours. Hgb A1c: No results for input(s): HGBA1C in the last 72 hours. Lipid Profile: No results for input(s): CHOL, HDL, LDLCALC, TRIG, CHOLHDL, LDLDIRECT in the last 72 hours. Thyroid function studies: No results for input(s): TSH, T4TOTAL, T3FREE, THYROIDAB in the last 72 hours.  Invalid input(s): FREET3 Anemia work up: No results for input(s): VITAMINB12, FOLATE, FERRITIN, TIBC, IRON, RETICCTPCT in the last 72 hours. Sepsis Labs: Recent Labs  Lab 11/26/20 0418 11/27/20 0430 11/27/20 1110 11/28/20 0505 11/29/20 0051 11/30/20 0637 12/01/20 0026 12/02/20 0150  PROCALCITON 0.81  --  0.64 0.66  --   --   --   --   WBC 9.6   < >  --  12.5* 10.8* 10.2 9.2 8.3   < > = values in this interval not displayed.    Microbiology No results found for this or any previous visit (from the past 240 hour(s)).  Procedures and diagnostic studies:  DG Abd Portable 1V  Result Date: 12/01/2020 CLINICAL DATA:  Abdominal pain. EXAM: PORTABLE ABDOMEN - 1 VIEW COMPARISON:  CT of the abdomen and pelvis on 11/15/2020 FINDINGS: No evidence of bowel obstruction, ileus or signs of free air. No abnormal calcifications. Feeding tube is present with the tip located in the expected position of the distal stomach/proximal duodenum. IMPRESSION: No acute findings. The tip of the feeding tube is in the expected position of the distal stomach/proximal duodenum. Electronically Signed   By: 12/03/2020 M.D.   On: 12/01/2020 09:18   Irish Lack EKG SITE RITE  Result Date: 12/01/2020 If [facility] image not attached, placement could not be confirmed due to current cardiac rhythm.   Medications:   . amLODipine  10 mg Oral Daily  . chlorhexidine  15 mL Mouth Rinse BID  . Chlorhexidine Gluconate Cloth  6 each  Topical Daily  . cloNIDine  0.1 mg Oral BID  . dicyclomine  20 mg Oral TID AC & HS  . docusate sodium  100 mg Oral BID  . feeding supplement  237 mL Oral TID BM  . feeding supplement (PROSource TF)  45 mL Per Tube TID  . folic acid  1 mg Oral Daily  . heparin  5,000 Units Subcutaneous Q8H  . mouth rinse  15 mL Mouth Rinse q12n4p  . methadone  10 mg Oral Q8H  . multivitamin with minerals  1 tablet Oral Daily  . pantoprazole  40 mg Oral Daily  . polyethylene glycol  17 g Oral BID  . QUEtiapine  25 mg Oral BID  . senna  2 tablet Oral Daily  . sodium chloride flush  10-40 mL Intracatheter Q12H  . sodium chloride flush  10-40 mL Intracatheter Q12H  . sodium chloride flush  3 mL Intravenous Q12H  . thiamine  100 mg Oral Daily   Continuous Infusions: . sodium chloride Stopped (11/23/20 2116)  . DAPTOmycin (CUBICIN)  IV Stopped (12/01/20 2019)  . feeding supplement (OSMOLITE 1.5 CAL) Stopped (12/02/20 0828)     LOS: 16 days   Joseph Art  [facility]   How to contact the [facility] Attending or Consulting provider 7A - 7P or covering provider during after hours 7P -7A, for this patient?  1. Check the care team in [facility] and look for a) attending/consulting [facility] provider listed and b) the [facility] team listed 2. Log into [email or web address] and use [place]'s universal password to access. If you do not have the password, please contact the hospital operator. 3. Locate the [facility] provider you are looking for under [facility] and page to a number that you can be directly reached. 4. If you still have difficulty reaching the provider, please page the [facility] (Director on Call) for the Hospitalists listed on amion for assistance.  12/02/2020, 11:02 AM

## 2020-12-02 NOTE — Progress Notes (Signed)
Patient has been placed NPO at 10:30am for MRI to be performed at appx 3:00pm today, patient is NPO so that patient can be sedated for MRI, ok to hold methadone PO until after MRI is completed per Dr Zenaida Deed had 22 beat run of vtach, HR 172, at 1620---per anesthesia, ok to give methadone and clonidine now with few sips of water, Mag IVPB started per dr Benjamine Mola

## 2020-12-03 DIAGNOSIS — E46 Unspecified protein-calorie malnutrition: Secondary | ICD-10-CM

## 2020-12-03 DIAGNOSIS — K089 Disorder of teeth and supporting structures, unspecified: Secondary | ICD-10-CM

## 2020-12-03 LAB — BASIC METABOLIC PANEL
Anion gap: 9 (ref 5–15)
BUN: 31 mg/dL — ABNORMAL HIGH (ref 6–20)
CO2: 24 mmol/L (ref 22–32)
Calcium: 8.1 mg/dL — ABNORMAL LOW (ref 8.9–10.3)
Chloride: 103 mmol/L (ref 98–111)
Creatinine, Ser: 1.51 mg/dL — ABNORMAL HIGH (ref 0.44–1.00)
GFR, Estimated: 46 mL/min — ABNORMAL LOW (ref >60)
Glucose, Bld: 106 mg/dL — ABNORMAL HIGH (ref 70–99)
Potassium: 4.4 mmol/L (ref 3.5–5.1)
Sodium: 136 mmol/L (ref 135–145)

## 2020-12-03 LAB — CBC
HCT: 22.7 % — ABNORMAL LOW (ref 36.0–46.0)
Hemoglobin: 7.4 g/dL — ABNORMAL LOW (ref 12.0–15.0)
MCH: 26.9 pg (ref 26.0–34.0)
MCHC: 32.6 g/dL (ref 30.0–36.0)
MCV: 82.5 fL (ref 80.0–100.0)
Platelets: 283 10*3/uL (ref 150–400)
RBC: 2.75 MIL/uL — ABNORMAL LOW (ref 3.87–5.11)
RDW: 18.5 % — ABNORMAL HIGH (ref 11.5–15.5)
WBC: 12.1 10*3/uL — ABNORMAL HIGH (ref 4.0–10.5)
nRBC: 0 % (ref 0.0–0.2)

## 2020-12-03 LAB — GLUCOSE, CAPILLARY
Glucose-Capillary: 79 mg/dL (ref 70–99)
Glucose-Capillary: 126 mg/dL — ABNORMAL HIGH (ref 70–99)
Glucose-Capillary: 116 mg/dL — ABNORMAL HIGH (ref 70–99)
Glucose-Capillary: 150 mg/dL — ABNORMAL HIGH (ref 70–99)
Glucose-Capillary: 105 mg/dL — ABNORMAL HIGH (ref 70–99)

## 2020-12-03 LAB — MAGNESIUM: Magnesium: 2.2 mg/dL (ref 1.7–2.4)

## 2020-12-03 NOTE — Plan of Care (Signed)

## 2020-12-03 NOTE — Plan of Care (Signed)
  Problem: Fluid Volume: Goal: Hemodynamic stability will improve Outcome: Progressing   Problem: Clinical Measurements: Goal: Signs and symptoms of infection will decrease Outcome: Progressing   Problem: Respiratory: Goal: Ability to maintain adequate ventilation will improve Outcome: Progressing   Problem: Clinical Measurements: Goal: Ability to maintain clinical measurements within normal limits will improve Outcome: Progressing

## 2020-12-03 NOTE — Progress Notes (Signed)
[facility]  Date of Admission:  11/15/2020      Total days of antibiotics 17   Daptomycin            ASSESSMENT: Anne Bautista is a 35 y.o. 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   Septic emboli to lungs a/w right sided endocarditis - currently on daptomycin and had completed a few weeks of doxycycline/vancomycin prior to this switch for better lung penetration. She did complain today of increased coughing and respiratory symptoms. On exam she is tachycardic still with normal RR and room air oxygen saturation > 96%. We discussed pulmonary hygiene with IS and FV today as well as ambulating in the hallway with PT (she states she has been scared to do so). Would have low threshold to check a CXR if she continues to remark on respiratory symptoms to re-evaluate septic pulmonary emboli. Daptomycin does not do well for treating lung tissue if the septic emboli were to evolve to involve the parenchyma.   MRI of R shoulder pending with anesthesia to be done tomorrow. Orthopedic team following for possible surgical intervention. Previously culture + from aspiration a few weeks ago.   L Foot - continues to improve. No tenderness or trouble with weightbearing on this leg.    Overall malnourished state still dependent on tube feeds - recommending SNF for discharge.      PLAN: 1. Continue Daptomycin  2. Follow MRI results - appreciate orthopedics coming back to see her  3. Follow pulmonary status  4. Pulmonary hygiene + ambulation   5. Nutrition    Principal Problem:   Severe sepsis due to methicillin resistant Staphylococcus aureus (MRSA) with acute organ dysfunction (HCC) Active Problems:   IV drug abuse (HCC)   Poor dentition   Malnutrition (HCC)   Thrombocytopenia (HCC)   Tobacco dependence    Acute cystitis without hematuria   Malnutrition of moderate degree   Abdominal pain   Left foot pain   Septic embolism (HCC)   Endocarditis of tricuspid valve   Staphylococcal arthritis of right shoulder (HCC)   MRSA bacteremia   Acute on chronic respiratory failure with hypoxia (HCC)   . amLODipine  10 mg Oral Daily  . chlorhexidine  15 mL Mouth Rinse BID  . Chlorhexidine Gluconate Cloth  6 each Topical Daily  . cloNIDine  0.1 mg Oral BID  . dicyclomine  20 mg Oral TID AC & HS  . docusate sodium  100 mg Oral BID  . feeding supplement  237 mL Oral TID BM  . feeding supplement (PROSource TF)  45 mL Per Tube TID  . folic acid  1 mg Oral Daily  . heparin  5,000 Units Subcutaneous Q8H  . mouth rinse  15 mL Mouth Rinse q12n4p  . methadone  10 mg Oral Q8H  . multivitamin with minerals  1 tablet Oral Daily  . pantoprazole  40 mg Oral Daily  . polyethylene glycol  17 g Oral BID  . QUEtiapine  25 mg Oral BID  . senna  2 tablet Oral Daily  . sodium chloride flush  10-40 mL Intracatheter Q12H  . sodium chloride flush  10-40 mL Intracatheter Q12H  . sodium chloride flush  3 mL Intravenous Q12H  . thiamine  100 mg Oral Daily    SUBJECTIVE:  She reports having increased cough today. Afraid to get up walking in the halls.    Review of Systems: Review of Systems  Constitutional: Negative for fever.  Respiratory: Positive for cough. Negative for shortness of breath.   Cardiovascular: Positive for chest pain. Negative for leg swelling.  Musculoskeletal: Positive for joint pain (R shoulder ). Negative for back pain.  Skin: Negative for itching and rash.    No Known Allergies  OBJECTIVE: Vitals:   12/03/20 0423 12/03/20 0600 12/03/20 0630 12/03/20 0800  BP: (!) 168/94 131/76  132/76  Pulse: (!) 122  (!) 115 (!) 104  Resp: (!) 21  18 20   Temp: (!) 101.5 F (38.6 C)  99.4 F (37.4 C) 98.8 F (37.1 C)  TempSrc: Oral  Oral Oral  SpO2: 95%  94%   Weight:      Height:       Body  mass index is 25.97 kg/m.  Physical Exam Constitutional:      Comments: Sitting up in bed trying to swallow pills. Moaning.   HENT:     Mouth/Throat:     Mouth: Mucous membranes are moist.     Pharynx: Oropharynx is clear.  Cardiovascular:     Rate and Rhythm: Regular rhythm. Tachycardia present.     Heart sounds: No murmur heard.   Pulmonary:     Effort: Pulmonary effort is normal.     Breath sounds: Rhonchi present.  Abdominal:     General: Bowel sounds are normal.     Palpations: Abdomen is soft.  Skin:    Capillary Refill: Capillary refill takes less than 2 seconds.  Neurological:     Mental Status: She is alert and oriented to person, place, and time.     Lab Results Lab Results  Component Value Date   WBC 12.1 (H) 12/03/2020   HGB 7.4 (L) 12/03/2020   HCT 22.7 (L) 12/03/2020   MCV 82.5 12/03/2020   PLT 283 12/03/2020    Lab Results  Component Value Date   CREATININE 1.51 (H) 12/03/2020   BUN 31 (H) 12/03/2020   NA 136 12/03/2020   K 4.4 12/03/2020   CL 103 12/03/2020   CO2 24 12/03/2020    Lab Results  Component Value Date   ALT 29 12/02/2020   AST 41 12/02/2020   ALKPHOS 98 12/02/2020   BILITOT 0.6 12/02/2020     Microbiology: No results found for this or any previous visit (from the past 240 hour(s)).   02/01/2021, MSN, NP-C [facility]  [email or web address] Pager: [phone] Office: [phone] [facility] Main Line: [phone]

## 2020-12-03 NOTE — Progress Notes (Signed)
Progress Note    Anne Bautista  ZOX:[id number] DOB: 04/11/1986  DOA: 11/15/2020 PCP: Patient, No Pcp Per    Brief Narrative:     Medical records reviewed and are as summarized below:  Anne Bautista is an 35 y.o. female with past medical history of IV drug use, tobacco use, malnutrition presented with myalgias and cough.  In the ED she was diagnosed with sepsis [facility]  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  ID/CT surgery/ orthopedics were consulted. She was transferred to hospitalist service on 11/25/2020. Needs 6 weeks of IV abx.  MRI of shoulder pending as well    Assessment/Plan:   Principal Problem:   Severe sepsis due to methicillin resistant Staphylococcus aureus (MRSA) with acute organ dysfunction (HCC) Active Problems:   IV drug abuse (HCC)   Poor dentition   Malnutrition (HCC)   Thrombocytopenia (HCC)   Tobacco dependence   Acute cystitis without hematuria   Malnutrition of moderate degree   Abdominal pain   Left foot pain   Septic embolism (HCC)   Endocarditis of tricuspid valve   Staphylococcal arthritis of right shoulder (HCC)   MRSA bacteremia   Acute on chronic respiratory failure with hypoxia (HCC)    Severe sepsis due to methicillin resistant Staphylococcus aureus (MRSA) bacteremia with acute organ dysfunction, with infectious tricuspid valve endocarditis. Septic emboli to both lungs, kidney, shoulder, and possible left ankle. -Orthopedics consulted for right shoulder and left ankle pain with concern for septic joints.  Right shoulder aspiration positive for MRSA, septic left ankle r/o by orthopedics. MRI left ankle was refused by patient. -Cardiovascular surgery following for possible angiovac debridement once sepsis has cleared. -Infectious Disease consulted for management of bacteremia and endocarditis, recommend a 6 week course  of IV daptomycin -Continue IV daptomycin, pharmacy to dose with weekly CK levels on Sunday. -PICC line placed: 2/28 -ortho re-consulted for eval of right shoulder: plan for MRI with sedation  Acute respiratory failure, tachypnea, in setting of narcotics withdrawal superimposed on bilateral embolic pneumonias. -Not oxygen dependent at baseline, currently on 2L [place] -Wean oxygen as able for sats > 90 % -incentive spirometry and mobilization -OOB to chair each shift  Acute blood loss anemia/iron deficiency anemia -Hgb 6.7->(1 U PRBC's given, 2/22)->8.2->6.6 (1 U PRBC's given, 2/23)->8.6->8.0 -Trend H & H- transfuse for <7  Acute cystitis without hematuria -11/15/20 U/A positive for acute cystitis with bacteria, hyaline casts, and positive nitrites Urine culture positive for MRSA with resistance to oxacillin. -Vancomycin per pharmacy completed.  Non-Oliguric AKI with ATN -Creatinine on admission 1.3,peak 2.81, stable -Supportive care -Strict I/O's -Trend BMP -renal signed off  Hypertension -Improving, still not at goal -Continue Norvasc 10 mg daily -add clonidine which will also help with withdrawal of any-kind although at 15 days should be past  Malnutrition to a moderate degree -Albumin 1.4, per patient can swallow and feed self but needs meal set up -Per SLP, Dysphagia 2 diet with full supervision, meds crushed in puree -Continues to have poor appetite eating <10 % of meals -Continue Coretrak with Osmolite 1.5  -may need palliative care discussion if fails to progress  GERD -Continue PPI  Chronic Constipation -Continue bowel regimen -x ray 2/28 negative for ileus or obstruction  IV drug abuse, cocaine use, marijuana use,tobacco dependence -UDSpositive for cocaine -Hepatitis C negative, HIV negative, RPR negative (11/16/20) -encourage cessation  Physical Debility -PT assessed  with recommendations for SNF -OOB to chair every shift.      Family  Communication/Anticipated D/C date and plan/Code Status   DVT prophylaxis: heparin Code Status: Full Code.  Disposition Plan: Status is: Inpatient  Remains inpatient appropriate because:IV treatments appropriate due to intensity of illness or inability to take PO   Dispo: The patient is from: Home              Anticipated d/c is to: SNF              Patient currently is not medically stable to d/c.   Difficult to place patient Yes    Medical Consultants:   Renal (signed off)- 2/24 CVTS: last seen 2/16: Given her clinical picture, Angiovac debridement would present a higher risk of prolonged mechanical ventillation, and potentially worsening AKI.  Additionally, her most recent cultures have been negative. Once she recovers from this septic bout, we can consider debridement. PCCM ID- last seen 2/23:   Continue Daptomycin as is, CPK 45   Follow up with CT surgery regarding timing of surgical intervention  Follow up with Ortho for left ankle pain and swelling  [place] of left ankle/foot pain   Monitor CPK   Will follow intermittently  Ortho- last seen 2/22    Subjective:   Resting- no current complaints   Objective:    Vitals:   12/03/20 0630 12/03/20 0800 12/03/20 1000 12/03/20 1128  BP:  132/76  126/73  Pulse: (!) 115 (!) 104  (!) 103  Resp: 18 20  20   Temp: 99.4 F (37.4 C) 98.8 F (37.1 C)  98 F (36.7 C)  TempSrc: Oral Oral  Oral  SpO2: 94%  98%   Weight:      Height:        Intake/Output Summary (Last 24 hours) at 12/03/2020 1302 Last data filed at 12/03/2020 1000 Gross per 24 hour  Intake 623.1 ml  Output 1750 ml  Net -1126.9 ml   Filed Weights   11/30/20 0500 12/01/20 0328 12/02/20 0300  Weight: 77.6 kg 74 kg 75.2 kg    Exam:  General: Appearance:     Overweight chronically ill appearing female in no acute distress     Lungs:     respirations unlabored  Heart:    Tachycardic. Normal rhythm. No murmurs, rubs, or gallops.      Neurologic:    Resting, will awaken and moans    Data Reviewed:   I have personally reviewed following labs and imaging studies:  Labs: Labs show the following:   Basic Metabolic Panel: Recent Labs  Lab 11/26/20 1902 11/27/20 0430 11/27/20 0430 11/27/20 2231 11/28/20 0505 11/29/20 0051 11/30/20 12/02/20 12/01/20 0026 12/02/20 0150 12/03/20 0440  NA  --  136   < >  --  135 136 138 137 136 136  K  --  4.7   < >  --  4.2 4.2 4.3 4.3 4.2 4.4  CL  --  104   < >  --  105 104 102 103 102 103  CO2  --  21*   < >  --  22 23 26 24 25 24   GLUCOSE  --  132*   < >  --  125* 126* 120* 122* 120* 106*  BUN  --  49*   < >  --  43* 42* 37* 35* 35* 31*  CREATININE  --  2.59*   < >  --  2.22* 2.10* 1.77* 1.55* 1.64*  1.51*  CALCIUM  --  8.2*   < >  --  8.0* 8.0* 8.4* 8.1* 8.1* 8.1*  MG  --  2.0  --   --  1.7  --   --   --   --  2.2  PHOS 5.3* 6.2*  --  5.5* 5.4*  --   --   --   --   --    < > = values in this interval not displayed.   GFR Estimated Creatinine Clearance: 55 mL/min (A) (by C-G formula based on SCr of 1.51 mg/dL (H)). Liver Function Tests: Recent Labs  Lab 11/27/20 0430 11/28/20 0505 12/02/20 0150  AST 21 21 41  ALT 18 17 29   ALKPHOS 70 62 98  BILITOT 0.5 0.6 0.6  PROT 7.4 7.4 7.8  ALBUMIN 1.5* 1.5* 1.6*   No results for input(s): LIPASE, AMYLASE in the last 168 hours. No results for input(s): AMMONIA in the last 168 hours. Coagulation profile No results for input(s): INR, PROTIME in the last 168 hours.  CBC: Recent Labs  Lab 11/27/20 0430 11/28/20 0505 11/29/20 0051 11/30/20 0637 12/01/20 0026 12/02/20 0150 12/03/20 0440  WBC 13.7* 12.5* 10.8* 10.2 9.2 8.3 12.1*  NEUTROABS 11.4* 10.2*  --   --   --   --   --   HGB 8.6* 8.0* 8.1* 8.3* 8.1* 7.1* 7.4*  HCT 26.9* 25.0* 24.7* 25.4* 26.4* 22.6* 22.7*  MCV 82.3 82.8 82.3 83.6 84.3 84.0 82.5  PLT 220 212 208 218 215 204 283   Cardiac Enzymes: Recent Labs  Lab 11/30/20 0637  CKTOTAL 23*   BNP (last 3 results) No results  for input(s): PROBNP in the last 8760 hours. CBG: Recent Labs  Lab 12/02/20 2017 12/02/20 2342 12/03/20 0419 12/03/20 0811 12/03/20 1129  GLUCAP 74 107* 79 126* 116*   D-Dimer: No results for input(s): DDIMER in the last 72 hours. Hgb A1c: No results for input(s): HGBA1C in the last 72 hours. Lipid Profile: No results for input(s): CHOL, HDL, LDLCALC, TRIG, CHOLHDL, LDLDIRECT in the last 72 hours. Thyroid function studies: No results for input(s): TSH, T4TOTAL, T3FREE, THYROIDAB in the last 72 hours.  Invalid input(s): FREET3 Anemia work up: No results for input(s): VITAMINB12, FOLATE, FERRITIN, TIBC, IRON, RETICCTPCT in the last 72 hours. Sepsis Labs: Recent Labs  Lab 11/27/20 1110 11/28/20 0505 11/29/20 0051 11/30/20 0637 12/01/20 0026 12/02/20 0150 12/03/20 0440  PROCALCITON 0.64 0.66  --   --   --   --   --   WBC  --  12.5*   < > 10.2 9.2 8.3 12.1*   < > = values in this interval not displayed.    Microbiology No results found for this or any previous visit (from the past 240 hour(s)).  Procedures and diagnostic studies:  02/02/21 EKG SITE RITE  Result Date: 12/01/2020 If [facility] image not attached, placement could not be confirmed due to current cardiac rhythm.   Medications:   . amLODipine  10 mg Oral Daily  . chlorhexidine  15 mL Mouth Rinse BID  . Chlorhexidine Gluconate Cloth  6 each Topical Daily  . cloNIDine  0.1 mg Oral BID  . dicyclomine  20 mg Oral TID AC & HS  . docusate sodium  100 mg Oral BID  . feeding supplement  237 mL Oral TID BM  . feeding supplement (PROSource TF)  45 mL Per Tube TID  . folic acid  1 mg Oral Daily  . heparin  5,000 Units Subcutaneous Q8H  . mouth rinse  15 mL Mouth Rinse q12n4p  . methadone  10 mg Oral Q8H  . multivitamin with minerals  1 tablet Oral Daily  . pantoprazole  40 mg Oral Daily  . polyethylene glycol  17 g Oral BID  . QUEtiapine  25 mg Oral BID  . senna  2 tablet Oral Daily  . sodium chloride flush   10-40 mL Intracatheter Q12H  . sodium chloride flush  10-40 mL Intracatheter Q12H  . sodium chloride flush  3 mL Intravenous Q12H  . thiamine  100 mg Oral Daily   Continuous Infusions: . sodium chloride Stopped (11/23/20 2116)  . DAPTOmycin (CUBICIN)  IV Stopped (12/02/20 2044)  . feeding supplement (OSMOLITE 1.5 CAL) Stopped (12/03/20 0830)     LOS: 17 days   Joseph Art  [facility]   How to contact the [facility] Attending or Consulting provider 7A - 7P or covering provider during after hours 7P -7A, for this patient?  1. Check the care team in [facility] and look for a) attending/consulting TRH provider listed and b) the [facility] team listed 2. Log into [email or web address] and use [facility]'s universal password to access. If you do not have the password, please contact the hospital operator. 3. Locate the [facility] provider you are looking for under [facility] and page to a number that you can be directly reached. 4. If you still have difficulty reaching the provider, please page the [facility] (Director on Call) for the Hospitalists listed on amion for assistance.  12/03/2020, 1:02 PM

## 2020-12-03 NOTE — Progress Notes (Signed)
Physical Therapy Treatment Patient Details Name: Anne Bautista MRN: [id number] DOB: 1985/11/18 Today's Date: 12/03/2020    History of Present Illness 35 yo admitted 2/14 with sepsis due to MRSA bacteremia in setting of IVDU. Rt shoulder pain s/p aspiration 2/14. PMHx: IVDU, malnutrition, poor dentition    PT Comments    Pt requires increased time and encouragement to participate in therapy today. Ultimately agrees to getting up but not to sit in chair. Ambulated around foot of bed and ultimately pt agreed to sitting up in recliner. D/c plans remain appropriate. PT will continue to follow acutely.    Follow Up Recommendations  SNF;Supervision for mobility/OOB     Equipment Recommendations  Wheelchair (measurements PT);3in1 (PT)       Precautions / Restrictions Precautions Precautions: Fall Precaution Comments: right shoulder, left ankle, back pain, watch sats/RR, bil UE edema    Mobility  Bed Mobility Overal bed mobility: Needs Assistance Bed Mobility: Supine to Sit     Supine to sit: Mod assist;HOB elevated     General bed mobility comments: increased time and effort to come to EoB, maximal encouragement and one step commands for sequencing, mod A for bringing trunk to upright, pt able to scoot to EOB wtihout assist    Transfers Overall transfer level: Needs assistance Equipment used: Rolling walker (2 wheeled) Transfers: Sit to/from Stand Sit to Stand: Min assist         General transfer comment: min A for power up to RW, increased  moaning  Ambulation/Gait Ambulation/Gait assistance: Min assist Gait Distance (Feet): 15 Feet Assistive device: Rolling walker (2 wheeled) Gait Pattern/deviations: Step-to pattern;Decreased step length - right;Decreased step length - left;Shuffle;Antalgic Gait velocity: slowed Gait velocity interpretation: <1.31 ft/sec, indicative of household ambulator General Gait Details: min A for steadying, with slow, mildly instability no overt  LoB, or knee buckling         Balance Overall balance assessment: Needs assistance Sitting-balance support: Bilateral upper extremity supported;Feet supported Sitting balance-Leahy Scale: Fair Sitting balance - Comments: minguard for static sitting balance   Standing balance support: Bilateral upper extremity supported Standing balance-Leahy Scale: Poor Standing balance comment: reliant on trunk and UE support                            Cognition Arousal/Alertness: Lethargic Behavior During Therapy: Flat affect Overall Cognitive Status: Impaired/Different from baseline Area of Impairment: Attention;Following commands;Safety/judgement;Awareness;Problem solving                   Current Attention Level: Sustained   Following Commands: Follows one step commands with increased time Safety/Judgement: Decreased awareness of safety;Decreased awareness of deficits Awareness: Intellectual Problem Solving: Slow processing;Decreased initiation;Difficulty sequencing;Requires verbal cues;Requires tactile cues General Comments: increased communication this session, but continues with constant whimpering/moaning         General Comments General comments (skin integrity, edema, etc.): HR to 120s with mobility,      Pertinent Vitals/Pain Pain Assessment: Faces Pain Location: pt moaning throughout session. pt states she hurts all over but mainly her legs Pain Descriptors / Indicators: Discomfort;Grimacing Pain Intervention(s): Limited activity within patient's tolerance;Monitored during session;Repositioned           PT Goals (current goals can now be found in the care plan section) Acute Rehab PT Goals Patient Stated Goal: to decrease pain PT Goal Formulation: With patient Time For Goal Achievement: 12/17/20 Potential to Achieve Goals: Fair Progress towards PT goals: Progressing toward goals  Frequency    Min 3X/week      PT Plan Current plan remains  appropriate       AM-PAC PT "6 Clicks" Mobility   Outcome Measure  Help needed turning from your back to your side while in a flat bed without using bedrails?: None Help needed moving from lying on your back to sitting on the side of a flat bed without using bedrails?: A Lot Help needed moving to and from a bed to a chair (including a wheelchair)?: A Little Help needed standing up from a chair using your arms (e.g., wheelchair or bedside chair)?: A Lot Help needed to walk in hospital room?: A Little Help needed climbing 3-5 steps with a railing? : A Lot 6 Click Score: 16    End of Session Equipment Utilized During Treatment: Gait belt Activity Tolerance: Patient limited by pain Patient left: with call bell/phone within reach;in chair;with chair alarm set Nurse Communication: Mobility status PT Visit Diagnosis: Other abnormalities of gait and mobility (R26.89);Difficulty in walking, not elsewhere classified (R26.2);Pain;Muscle weakness (generalized) (M62.81) Pain - part of body:  (stomach)     Time: 0454-0981 PT Time Calculation (min) (ACUTE ONLY): 23 min  Charges:  $Gait Training: 8-22 mins $Therapeutic Activity: 8-22 mins                     Tiasia Weberg B. Beverely Risen PT, DPT Acute Rehabilitation Services Pager [phone] Office [phone]    [place]on Alas [facility] 12/03/2020, 2:39 PM

## 2020-12-04 ENCOUNTER — Encounter (HOSPITAL_COMMUNITY)
Admission: EM | Discharge status: Against Medical Advice | Payer: Self-pay | Source: Home / Self Care | Attending: Internal Medicine

## 2020-12-04 ENCOUNTER — Inpatient Hospital Stay (HOSPITAL_COMMUNITY): Payer: Self-pay

## 2020-12-04 ENCOUNTER — Encounter (HOSPITAL_COMMUNITY): Payer: Self-pay | Admitting: Internal Medicine

## 2020-12-04 ENCOUNTER — Inpatient Hospital Stay (HOSPITAL_COMMUNITY): Payer: Self-pay | Admitting: Certified Registered"

## 2020-12-04 HISTORY — PX: RADIOLOGY WITH ANESTHESIA: SHX6223

## 2020-12-04 LAB — GLUCOSE, CAPILLARY
Glucose-Capillary: 146 mg/dL — ABNORMAL HIGH (ref 70–99)
Glucose-Capillary: 94 mg/dL (ref 70–99)
Glucose-Capillary: 96 mg/dL (ref 70–99)
Glucose-Capillary: 97 mg/dL (ref 70–99)
Glucose-Capillary: 98 mg/dL (ref 70–99)

## 2020-12-04 SURGERY — MRI WITH ANESTHESIA
Anesthesia: General | Site: Shoulder | Laterality: Right

## 2020-12-04 MED ORDER — GUAIFENESIN 100 MG/5ML PO SOLN
5.0000 mL | ORAL | Status: DC | PRN
  Administered 2020-12-04 – 2020-12-24 (×16): 100 mg via ORAL
  Filled 2020-12-04 (×16): qty 5

## 2020-12-04 MED ORDER — PROMETHAZINE HCL 25 MG/ML IJ SOLN: 6.2500 mg | INTRAMUSCULAR | Status: DC | PRN

## 2020-12-04 MED ORDER — ALBUTEROL SULFATE HFA 108 (90 BASE) MCG/ACT IN AERS
INHALATION_SPRAY | RESPIRATORY_TRACT | Status: DC | PRN
  Administered 2020-12-04 (×5): 2 via RESPIRATORY_TRACT

## 2020-12-04 MED ORDER — PROPOFOL 10 MG/ML IV BOLUS
INTRAVENOUS | Status: DC | PRN
  Administered 2020-12-04: 180 mg via INTRAVENOUS

## 2020-12-04 MED ORDER — FENTANYL CITRATE (PF) 100 MCG/2ML IJ SOLN
INTRAMUSCULAR | Status: DC | PRN
  Administered 2020-12-04: 100 ug via INTRAVENOUS

## 2020-12-04 MED ORDER — LACTATED RINGERS IV SOLN: INTRAVENOUS | Status: DC

## 2020-12-04 MED ORDER — LIDOCAINE 2% (20 MG/ML) 5 ML SYRINGE
INTRAMUSCULAR | Status: DC | PRN
  Administered 2020-12-04: 60 mg via INTRAVENOUS

## 2020-12-04 MED ORDER — ONDANSETRON HCL 4 MG/2ML IJ SOLN
INTRAMUSCULAR | Status: DC | PRN
  Administered 2020-12-04: 4 mg via INTRAVENOUS

## 2020-12-04 MED ORDER — DEXMEDETOMIDINE (PRECEDEX) IN NS 20 MCG/5ML (4 MCG/ML) IV SYRINGE
PREFILLED_SYRINGE | INTRAVENOUS | Status: DC | PRN
  Administered 2020-12-04: 10 ug via INTRAVENOUS
  Administered 2020-12-04: 8 ug via INTRAVENOUS
  Administered 2020-12-04: 10 ug via INTRAVENOUS

## 2020-12-04 MED ORDER — MIDAZOLAM HCL 5 MG/5ML IJ SOLN
INTRAMUSCULAR | Status: DC | PRN
  Administered 2020-12-04: 2 mg via INTRAVENOUS

## 2020-12-04 MED ORDER — CHLORHEXIDINE GLUCONATE 0.12 % MT SOLN: 15.0000 mL | Freq: Once | OROMUCOSAL | Status: DC

## 2020-12-04 MED ORDER — PHENYLEPHRINE 40 MCG/ML (10ML) SYRINGE FOR IV PUSH (FOR BLOOD PRESSURE SUPPORT)
PREFILLED_SYRINGE | INTRAVENOUS | Status: DC | PRN
  Administered 2020-12-04 (×2): 200 ug via INTRAVENOUS

## 2020-12-04 NOTE — Progress Notes (Signed)
OT Cancellation Note  Patient Details Name: Payden Bonus MRN: [id number] DOB: December 03, 1985   Cancelled Treatment:    Reason Eval/Treat Not Completed: Patient declined, no reason specified. OT will continue efforts when patient is agreeable with therapy efforts.   Kallie Edward OTR/L Supplemental OT, Department of rehab services [phone]  Alea Ryer R H. 12/04/2020, 10:47 AM

## 2020-12-04 NOTE — Transfer of Care (Signed)
Immediate Anesthesia Transfer of Care Note  Patient: Anne Bautista  Procedure(s) Performed: MRI WITH ANESTHESIA (Right Shoulder)  Patient Location: PACU  Anesthesia Type:General  Level of Consciousness: drowsy and patient cooperative  Airway & Oxygen Therapy: Patient Spontanous Breathing  Post-op Assessment: Report given to RN and Post -op Vital signs reviewed and stable  Post vital signs: Reviewed and stable  Last Vitals:  Vitals Value Taken Time  BP 96/77 12/04/20 1510  Temp 36.9 C 12/04/20 1510  Pulse 112 12/04/20 1512  Resp 21 12/04/20 1512  SpO2 88 % 12/04/20 1512  Vitals shown include unvalidated device data.  Last Pain:  Vitals:   12/04/20 1510  TempSrc:   PainSc: 0-No pain      Patients Stated Pain Goal: 0 (11/30/20 1924)  Complications: No complications documented.

## 2020-12-04 NOTE — Progress Notes (Signed)
PROGRESS NOTE    Anne Bautista  YNW:[id number] DOB: 02/25/1986 DOA: 11/15/2020 PCP: Patient, No Pcp Per   Chief Complaint  Patient presents with  . 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  She was seen by infectious disease and CT surgery and orthopedics See her once stabilized [facility] She remains on IV antibiotics and planned for x6 weeks  Subjective: Seen and examined this morning no new complaints. Pain on left ankle whole body hurts Neck stiffness or back pain cortrak came off last night by accident she says Able to swallow, reprots seh ate some food this ma and did fine. 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  At this time vitals are stable.  She remains on IV daptomycin pharmacy dosing with weekly CK on Sunday, PICC line since 2/28.Last blood culture negative on 2/18.  Cardiac surgery following for possible angiovac debridement once sepsis has cleared Ortho reconsulted for right shoulder pain and MRI w/ sedation pending Low-grade fever 100.4 this morning  IV drug abuse/cocaine use marijuana use tobacco dependence UDS positive for cocaine, hep C negative HIV negative RPR negative, cessation was encouraged  Acute hypoxic respiratory failure/narcotic withdrawal with  bilateral embolic pneumonia: Resolved currently on room air antibiotics as above encourage ambulation incentive spirometry mobilization  Acute blood loss anemia/iron deficiency anemia status post PRBC transfusion 2/22 and 2/23.  Monitor and transfuse if less than 7 g Recent Labs  Lab 11/29/20 0051 11/30/20 0637 12/01/20 0026 12/02/20 0150 12/03/20 0440  HGB 8.1* 8.3* 8.1* 7.1* 7.4*  HCT 24.7* 25.4* 26.4* 22.6* 22.7*   Acute cystitis without hematuria culture positive for MRSA treated with antibiotics  Nonoliguric AKI with ATN, creatinine peaked to 2.8.At this time stable 1.5 Recent Labs  Lab 11/29/20 0051 11/30/20 0637 12/01/20 0026 12/02/20 0150 12/03/20 0440  BUN 42* 37* 35* 35* 31*  CREATININE 2.10* 1.77* 1.55* 1.64* 1.51*   Hypertension: Blood pressure stable on Norvasc 2 mg, clonidine to help with withdrawals as well  GERD on PPI  Chronic constipation, continue laxatives x-ray 2/28 - for ileus or obstruction  Debility/deconditioning mobilize.  Moderate protein calorie malnutrition see below. FEN: Cortrak got  dislodged, continue speech and dietitian on board she seems to be tolerating p.o.  Plan of care discussed with the patient she understands the gravity of her illness and the cause behind it - we talked about need to stay in the hospital and complete IV antibiotics she understands if she does not finish the course she is at risk of worsening sepsis septic shock which could lead into multiorgan failure and death. I have educated and counseled her not to leave AGAINST MEDICAL ADVICE at any point before completing treatment. Monitor closely at risk of decompensation.  Diet Order            DIET DYS 2 Room  service appropriate? Yes; Fluid consistency: Thin  Diet effective now                 Nutrition Problem: Moderate Malnutrition Etiology: social / environmental circumstances (IVDU) Signs/Symptoms: mild fat depletion,moderate fat depletion,mild muscle  depletion,moderate muscle depletion Interventions: Ensure Enlive (each supplement provides 350kcal and 20 grams of protein),MVI Patient's Body mass index is 25.97 kg/m.   DVT prophylaxis: heparin injection 5,000 Units Start: 11/20/20 1400 SCDs Start: 11/16/20 1257 Code Status:   Code Status: Full Code Family Communication: plan of care discussed with patient at bedside. Lived w boyfired and self updating  Status is: Inpatient Remains inpatient appropriate because:Unsafe d/c plan, IV treatments appropriate due to intensity of illness or inability to take PO and Inpatient level of care appropriate due to severity of illness  Dispo: The patient is from: Home              Anticipated d/c is to: TBD              Patient currently is not medically stable to d/c.   Difficult to place patient Yes   Unresulted Labs (From admission, onward)          Start     Ordered   11/30/20 0500  CK  Weekly,   R     Question:  Specimen collection method  Answer:  Lab=Lab collect   11/29/20 1242          Medications reviewed: Scheduled Meds: . amLODipine  10 mg Oral Daily  . chlorhexidine  15 mL Mouth Rinse BID  . Chlorhexidine Gluconate Cloth  6 each Topical Daily  . cloNIDine  0.1 mg Oral BID  . dicyclomine  20 mg Oral TID AC & HS  . docusate sodium  100 mg Oral BID  . feeding supplement  237 mL Oral TID BM  . feeding supplement (PROSource TF)  45 mL Per Tube TID  . folic acid  1 mg Oral Daily  . heparin  5,000 Units Subcutaneous Q8H  . mouth rinse  15 mL Mouth Rinse q12n4p  . methadone  10 mg Oral Q8H  . multivitamin with minerals  1 tablet Oral Daily  . pantoprazole  40 mg Oral Daily  . polyethylene glycol  17 g Oral BID  . QUEtiapine  25 mg Oral BID  . senna  2 tablet Oral Daily  . sodium chloride flush  10-40 mL Intracatheter Q12H  . sodium chloride flush  10-40 mL Intracatheter Q12H  . sodium chloride flush  3 mL Intravenous Q12H  . thiamine  100 mg Oral Daily   Continuous  Infusions: . sodium chloride 250 mL (12/03/20 2223)  . DAPTOmycin (CUBICIN)  IV 764 mg (12/03/20 2226)  . feeding supplement (OSMOLITE 1.5 CAL) Stopped (12/03/20 2240)    Consultants:see note  Procedures:see note  Antimicrobials: Anti-infectives (From admission, onward)   Start     Dose/Rate Route Frequency Ordered Stop   11/28/20 2000  DAPTOmycin (CUBICIN) 764 mg in sodium chloride 0.9 % IVPB        10 mg/kg  76.4 kg 230.6 mL/hr over 30 Minutes Intravenous Daily 11/28/20 0727     11/24/20 2000  DAPTOmycin (CUBICIN) 827 mg in sodium chloride 0.9 % IVPB  Status:  Discontinued        10 mg/kg  82.7 kg 233.1 mL/hr over 30 Minutes Intravenous Daily 11/24/20 0821 11/28/20 0727   11/19/20 1000  DAPTOmycin (CUBICIN) 770 mg in sodium chloride 0.9 %  IVPB  Status:  Discontinued        10 mg/kg  77 kg 230.8 mL/hr over 30 Minutes Intravenous Daily 11/19/20 0908 11/24/20 0821   11/19/20 1000  doxycycline (VIBRA-TABS) tablet 100 mg        100 mg Oral Every 12 hours 11/19/20 0908 11/23/20 2123   11/19/20 0439  vancomycin variable dose per unstable renal function (pharmacist dosing)  Status:  Discontinued         Does not apply See admin instructions 11/19/20 0439 11/19/20 0853   11/16/20 2000  vancomycin (VANCOREADY) IVPB 1250 mg/250 mL  Status:  Discontinued        1,250 mg 166.7 mL/hr over 90 Minutes Intravenous Every 24 hours 11/15/20 1933 11/16/20 1108   11/16/20 1200  vancomycin (VANCOCIN) IVPB 1000 mg/200 mL premix  Status:  Discontinued        1,000 mg 200 mL/hr over 60 Minutes Intravenous Every 12 hours 11/16/20 1108 11/19/20 0439   11/16/20 0745  metroNIDAZOLE (FLAGYL) IVPB 500 mg        500 mg 100 mL/hr over 60 Minutes Intravenous  Once 11/16/20 0741 11/16/20 0907   11/15/20 2237  vancomycin (VANCOCIN) 500 MG powder       Note to Pharmacy: Ihor Dow   : cabinet override      11/15/20 2237 11/16/20 1044   11/15/20 2000  ceFEPIme (MAXIPIME) 2 g in sodium chloride 0.9 % 100 mL  IVPB  Status:  Discontinued        2 g 200 mL/hr over 30 Minutes Intravenous Every 12 hours 11/15/20 1933 11/16/20 1212   11/15/20 1945  vancomycin (VANCOREADY) IVPB 1500 mg/300 mL  Status:  Discontinued        1,500 mg 150 mL/hr over 120 Minutes Intravenous  Once 11/15/20 1933 11/15/20 1939   11/15/20 1945  vancomycin (VANCOCIN) IVPB 1000 mg/200 mL premix       "And" Linked Group Details   1,000 mg 200 mL/hr over 60 Minutes Intravenous  Once 11/15/20 1939 11/15/20 2231   11/15/20 1945  vancomycin (VANCOREADY) IVPB 500 mg/100 mL       "And" Linked Group Details   500 mg 100 mL/hr over 60 Minutes Intravenous  Once 11/15/20 1939 11/16/20 0002     Culture/Microbiology    Component Value Date/Time   SDES BLOOD RIGHT HAND 11/21/2020 0526   SDES BLOOD LEFT HAND 11/21/2020 0526   SPECREQUEST  11/21/2020 0526    BOTTLES DRAWN AEROBIC ONLY Blood Culture adequate volume   SPECREQUEST  11/21/2020 0526    BOTTLES DRAWN AEROBIC ONLY Blood Culture adequate volume   CULT  11/21/2020 0526    NO GROWTH 5 DAYS Performed at [facility], [address] [place], [place] [address]    CULT  11/21/2020 0526    NO GROWTH 5 DAYS Performed at [facility], [address], [place], [place] [address]    REPTSTATUS 11/26/2020 FINAL 11/21/2020 0526   REPTSTATUS 11/26/2020 FINAL 11/21/2020 0526    Other culture-see note  Objective: Vitals: Today's Vitals   12/04/20 0019 12/04/20 0213 12/04/20 0419 12/04/20 0812  BP: 132/89 131/74 (!) 141/83 133/72  Pulse: (!) 122 (!) 112 (!) 115 (!) 115  Resp: 18 18 19 18   Temp: 99.9 F (37.7 C) 99.1 F (37.3 C) 100.1 F (37.8 C) (!) 100.4 F (38 C)  TempSrc: Oral Oral Oral Oral  SpO2: 96% 94% 95% 94%  Weight:      Height:  PainSc:    0-No pain    Intake/Output Summary (Last 24 hours) at 12/04/2020 0910 Last data filed at 12/03/2020 2245 Gross per 24 hour  Intake 298 ml  Output --  Net 298 ml   Filed Weights   11/30/20 0500 12/01/20  0328 12/02/20 0300  Weight: 77.6 kg 74 kg 75.2 kg   Weight change:   Intake/Output from previous day: 03/02 0701 - 03/03 0700 In: 298 [P.O.:240; I.V.:13; NG/GT:45] Out: -  Intake/Output this shift: No intake/output data recorded. Filed Weights   11/30/20 0500 12/01/20 0328 12/02/20 0300  Weight: 77.6 kg 74 kg 75.2 kg    Examination: General exam: AAO x3 anxious, not in distress, thin older for age,NAD, weak appearing. HEENT:Oral mucosa moist, Ear/Nose WNL grossly,dentition normal. Respiratory system: bilaterally diminished,no use of accessory muscle, non tender. Cardiovascular system: S1 & S2 +, regular, No JVD. Gastrointestinal system: Abdomen soft, NT,ND, BS+. Nervous System:Alert, awake, moving extremities and grossly nonfocal Extremities: Tender right shoulder and left ankle no edema, distal peripheral pulses palpable.  Skin: No rashes,no icterus. MSK: Normal muscle bulk,tone, power  Data Reviewed: I have personally reviewed following labs and imaging studies CBC: Recent Labs  Lab 11/28/20 0505 11/29/20 0051 11/30/20 0637 12/01/20 0026 12/02/20 0150 12/03/20 0440  WBC 12.5* 10.8* 10.2 9.2 8.3 12.1*  NEUTROABS 10.2*  --   --   --   --   --   HGB 8.0* 8.1* 8.3* 8.1* 7.1* 7.4*  HCT 25.0* 24.7* 25.4* 26.4* 22.6* 22.7*  MCV 82.8 82.3 83.6 84.3 84.0 82.5  PLT 212 208 218 215 204 283   Basic Metabolic Panel: Recent Labs  Lab 11/27/20 2231 11/28/20 0505 11/28/20 0505 11/29/20 0051 11/30/20 0637 12/01/20 0026 12/02/20 0150 12/03/20 0440  NA  --  135   < > 136 138 137 136 136  K  --  4.2   < > 4.2 4.3 4.3 4.2 4.4  CL  --  105   < > 104 102 103 102 103  CO2  --  22   < > 23 26 24 25 24   GLUCOSE  --  125*   < > 126* 120* 122* 120* 106*  BUN  --  43*   < > 42* 37* 35* 35* 31*  CREATININE  --  2.22*   < > 2.10* 1.77* 1.55* 1.64* 1.51*  CALCIUM  --  8.0*   < > 8.0* 8.4* 8.1* 8.1* 8.1*  MG  --  1.7  --   --   --   --   --  2.2  PHOS 5.5* 5.4*  --   --   --   --   --    --    < > = values in this interval not displayed.   GFR: Estimated Creatinine Clearance: 55 mL/min (A) (by C-G formula based on SCr of 1.51 mg/dL (H)). Liver Function Tests: Recent Labs  Lab 11/28/20 0505 12/02/20 0150  AST 21 41  ALT 17 29  ALKPHOS 62 98  BILITOT 0.6 0.6  PROT 7.4 7.8  ALBUMIN 1.5* 1.6*   No results for input(s): LIPASE, AMYLASE in the last 168 hours. No results for input(s): AMMONIA in the last 168 hours. Coagulation Profile: No results for input(s): INR, PROTIME in the last 168 hours. Cardiac Enzymes: Recent Labs  Lab 11/30/20 0637  CKTOTAL 23*   BNP (last 3 results) No results for input(s): PROBNP in the last 8760 hours. HbA1C: No results for input(s): HGBA1C in  the last 72 hours. CBG: Recent Labs  Lab 12/03/20 1608 12/03/20 2028 12/04/20 0019 12/04/20 0419 12/04/20 0808  GLUCAP 150* 105* 146* 98 94   Lipid Profile: No results for input(s): CHOL, HDL, LDLCALC, TRIG, CHOLHDL, LDLDIRECT in the last 72 hours. Thyroid Function Tests: No results for input(s): TSH, T4TOTAL, FREET4, T3FREE, THYROIDAB in the last 72 hours. Anemia Panel: No results for input(s): VITAMINB12, FOLATE, FERRITIN, TIBC, IRON, RETICCTPCT in the last 72 hours. Sepsis Labs: Recent Labs  Lab 11/27/20 1110 11/28/20 0505  PROCALCITON 0.64 0.66    No results found for this or any previous visit (from the past 240 hour(s)).   Radiology Studies: No results found.   LOS: 18 days   Lanae Boast, MD [facility]  12/04/2020, 9:10 AM

## 2020-12-04 NOTE — Anesthesia Preprocedure Evaluation (Addendum)
Anesthesia Evaluation  Patient identified by MRN, date of birth, ID band Patient awake    Reviewed: Allergy & Precautions, NPO status , Patient's Chart, lab work & pertinent test results  History of Anesthesia Complications Negative for: history of anesthetic complications  Airway Mallampati: II  TM Distance: >3 FB Neck ROM: Full    Dental  (+) Dental Advisory Given, Teeth Intact,    Pulmonary shortness of breath, neg sleep apnea, neg COPD, neg recent URI, Current Smoker and Patient abstained from smoking.,  Covid-19 Nucleic Acid Test Results Lab Results      Component                Value               Date                      SARSCOV2NAA              NEGATIVE            11/15/2020               + wheezing      Cardiovascular + Valvular Problems/Murmurs  Rhythm:Regular   '22 TTE - EF 55 to 60%. Grade I diastolic dysfunction (impaired relaxation). 2 x 1 cm mobile vegetation attached to the tricuspid valve.     Neuro/Psych negative neurological ROS  negative psych ROS   GI/Hepatic negative GI ROS, (+)     substance abuse  cocaine use, marijuana use and IV drug use,   Endo/Other  negative endocrine ROS  Renal/GU Renal InsufficiencyRenal diseaseLab Results      Component                Value               Date                      CREATININE               1.51 (H)            12/03/2020                Musculoskeletal  (+) Arthritis ,   Abdominal   Peds  Hematology  (+) anemia ,   Anesthesia Other Findings Malnutrition Septic emboli Covid test negative 11/15/20, on admission    Reproductive/Obstetrics                            Anesthesia Physical Anesthesia Plan  ASA: III  Anesthesia Plan: General   Post-op Pain Management:    Induction: Intravenous  PONV Risk Score and Plan: 3 and Treatment may vary due to age or medical condition, Ondansetron and Midazolam  Airway  Management Planned: LMA  Additional Equipment: None  Intra-op Plan:   Post-operative Plan: Extubation in OR  Informed Consent:   Plan Discussed with: CRNA and Anesthesiologist  Anesthesia Plan Comments:         Anesthesia Quick Evaluation

## 2020-12-04 NOTE — Progress Notes (Signed)
PT Cancellation Note  Patient Details Name: Anne Bautista MRN: [id number] DOB: 03-19-1986   Cancelled Treatment:    Reason Eval/Treat Not Completed: Patient at procedure or test/unavailable. PT will follow up as time allows.   Arlyss Gandy 12/04/2020, 1:55 PM

## 2020-12-04 NOTE — Anesthesia Procedure Notes (Signed)
Procedure Name: LMA Insertion Date/Time: 12/04/2020 2:23 PM Performed by: Rosiland Oz, CRNA Pre-anesthesia Checklist: Patient identified, Emergency Drugs available, Suction available, Patient being monitored and Timeout performed Patient Re-evaluated:Patient Re-evaluated prior to induction Oxygen Delivery Method: Circle system utilized Preoxygenation: Pre-oxygenation with 100% oxygen Induction Type: IV induction LMA: LMA inserted LMA Size: 3.0 Number of attempts: 1 Placement Confirmation: positive ETCO2 and breath sounds checked- equal and bilateral Tube secured with: Tape Dental Injury: Teeth and Oropharynx as per pre-operative assessment

## 2020-12-04 NOTE — Progress Notes (Signed)
SLP Cancellation Note  Patient Details Name: Sherita Decoste MRN: [id number] DOB: October 03, 1986   Cancelled treatment:       Reason Eval/Treat Not Completed: Other (comment); pt NPO for procedure per nursing staff.  Will continue efforts next date as able.   Tressie Stalker, M.S., CCC-SLP 12/04/2020, 9:37 AM

## 2020-12-04 NOTE — Progress Notes (Signed)
Paged MD regarding coretrack accidentally pulled out by patient. Patient also has a DYS 2 thin liquid diet and is tolerating liquids well.   Patient does not desire to have coretrack back in.

## 2020-12-05 ENCOUNTER — Encounter (HOSPITAL_COMMUNITY): Payer: Self-pay | Admitting: Radiology

## 2020-12-05 LAB — GLUCOSE, CAPILLARY
Glucose-Capillary: 88 mg/dL (ref 70–99)
Glucose-Capillary: 93 mg/dL (ref 70–99)

## 2020-12-05 MED ORDER — ENSURE ENLIVE PO LIQD
237.0000 mL | Freq: Four times a day (QID) | ORAL | Status: DC
  Administered 2020-12-05 – 2020-12-18 (×24): 237 mL via ORAL
  Filled 2020-12-05: qty 237

## 2020-12-05 NOTE — Progress Notes (Signed)
  Speech Language Pathology Treatment: Dysphagia  Patient Details Name: Anne Bautista MRN: [id number] DOB: March 11, 1986 Today's Date: 12/05/2020 Time: 7482-7078 SLP Time Calculation (min) (ACUTE ONLY): 22 min  Assessment / Plan / Recommendation Clinical Impression  Today pt seen to assess po tolerance and readiness for dietary advancement.  Pt fully alert able to self feed. Minimal dysarthria noted with rapid rate of speech.  Pt observed consuming cereal and water.  No indication of aspiration or dysphagia across all consistencies. SLP informed her to importance of reflux precautions and oral care-dental brushing for pulmonary health.  Cough noted at baseline with HOB elevation and after intake.  Recommend regular/thin diet - and no SLP follow up needed.  Dysphagia has resolved due to resolution of AMS - Thanks for allowing SLP to assit with pt's care plan.    HPI HPI: Pt is a 35 y.o. admitted 2/14 with sepsis due to MRSA bacteremia in setting of IVDU. Rt shoulder pain s/p aspiration 2/14. PMHx: IVDU, malnutrition, poor dentition. Toxicology positive for cocaine. CXR 2/17: Stable bilateral rounded nodular densities are noted throughout both  lungs concerning for septic emboli or possibly metastatic disease.  Increased left basilar opacity is noted concerning for worsening  pneumonia with possible associated pleural effusion.  CT head Air-fluid level in the right maxillary sinus suggesting  rhinosinusitis. CXR 2/10: Slightly increased bilateral LOWER lung consolidation/atelectasis  and trace bilateral pleural effusions.      SLP Plan          Recommendations  Liquids provided via: Cup;Straw Medication Administration: Whole meds with liquid Supervision: Staff to assist with self feeding;Full supervision/cueing for compensatory strategies Compensations: Small sips/bites Postural Changes and/or Swallow Maneuvers: Seated upright 90 degrees;Upright 30-60 min after meal                Oral Care  Recommendations: Oral care BID Follow up Recommendations: Skilled Nursing facility;24 hour supervision/assistance SLP Visit Diagnosis: Dysphagia, unspecified (R13.10)       GO                Anne Bautista 12/05/2020, 8:45 AM  Anne Infante, MS [facility] SLP Acute Rehab Services Office [phone] Pager [phone]

## 2020-12-05 NOTE — Progress Notes (Signed)
Brief progress note: Patient underwent MRI scan with sedation on 12/04/2020.  MRI is positive for subdeltoid abscess with reactive marrow edema within the proximal humerus laterally and effusion of the glenohumeral joint.  We will plan for I&D tomorrow am.  Make n.p.o. past midnight.

## 2020-12-05 NOTE — Progress Notes (Addendum)
Nutrition Follow-up  DOCUMENTATION CODES:   Non-severe (moderate) malnutrition in context of social or environmental circumstances  INTERVENTION:   -D/c Prosource TF due to lack of feeding access -D/c Osmolite 1.5 due to lack of feeding access -Increase Ensure Enlive po QID, each supplement provides 350 kcal and 20 grams of protein -Continue MVI with minerals daily -Continue Magic cup BID with meals, each supplement provides 290 kcal and 9 grams of protein  NUTRITION DIAGNOSIS:   Moderate Malnutrition related to social / environmental circumstances (IVDU) as evidenced by mild fat depletion,moderate fat depletion,mild muscle depletion,moderate muscle depletion.  Ongoing  GOAL:   Patient will meet greater than or equal to 90% of their needs  Progressing   MONITOR:   PO intake,Supplement acceptance,Labs,Weight trends,Skin,I & O's  REASON FOR ASSESSMENT:   Consult Assessment of nutrition requirement/status,Poor PO  ASSESSMENT:   22 YOF admitted for severe sepsis w/ MRSA acute organ dysfunction. Septic emboli to both lungs, kidneys, R should, L ankle. Hx of IVDU, acute resp failure, tachypnea, AKI, cellulitis on L ankle, chronic constipation.  02/20 - SLP eval, recommendation for full liquid diet  02/23 - SLP eval, Dysphagia 1 diet with thin liquids 02/26 - diet advanced to dysphagia 2, thin liquids  3/3- cortrak tube removed; s/p MRI of rt shoulder 3/4- s/p BSE- advanced to regular diet with thin liquids  Reviewed I/O's: +710 ml x 24 hours and -3.7 L since 11/21/20  Pt unavailable at time of visit.  Per orthopedics notes, MRI is positive for subdeltoid abscess with reactive marrow edema within the proximal humerus laterally and effusion of the glenohumeral joint. Plan for I&D tomorrow (12/06/20).   Pt with poor meal intake. Noted meal completion 0-10%. Per RN, pt is tolerating liquids well and consuming Ensure Enlive supplements. Pt just advanced to a regular diet from a  dysphagia 2 diet; hopeful that oral intake will improve with diet advancement.   Wt has been stable since admission.   Medications reviewed and include miralax, thiamine, and senokot.   Per therapy notes, recommending SNF placement once medically stable.   Labs reviewed: CBGS: 94-98.   Diet Order:   Diet Order            Diet NPO time specified  Diet effective ____           Diet NPO time specified  Diet effective midnight           Diet regular Room service appropriate? Yes; Fluid consistency: Thin  Diet effective now                 EDUCATION NEEDS:   No education needs have been identified at this time  Skin:  Skin Assessment: Reviewed RN Assessment  Last BM:  12/03/20  Height:   Ht Readings from Last 1 Encounters:  11/16/20 5\' 7"  (1.702 m)    Weight:   Wt Readings from Last 1 Encounters:  12/02/20 75.2 kg    Ideal Body Weight:  61.4 kg  BMI:  Body mass index is 25.97 kg/m.  Estimated Nutritional Needs:   Kcal:  2100-2300  Protein:  115-130 grams  Fluid:  > 2 L    02/01/21, RD, LDN, CDCES Registered Dietitian II Certified Diabetes Care and Education Specialist Please refer to [facility] for RD and/or RD on-call/weekend/after hours pager

## 2020-12-05 NOTE — Progress Notes (Signed)
Brief ID Progress Note:   Noted MRI results of R shoulder c/w infection, as below.  Findings which could be suggestive of osteomyelitis versus reactive marrow seen through the lateral corner of the humeral head. No osseous fracture. Multilocular complex collection seen within the subdeltoid recess extending around the deltoid musculature posteriorly which could be due to infectious/inflammatory bursitis  She had + growth from aspiration from the joint about 2 weeks ago and failure to improve on appropriate antibiotics. Plan for I&D tomorrow with Dr. Susa Simmonds.   Depending on condition of the bone / operative findings will make further recommendations about treatment and duration of antibiotics for her.  Currently tolerating daptomycin well with no changes in CK.  Using low dose oxygen PRN intermittently. No worsening concern for now.   Will follow up again on Monday but Dr. Luciana Axe is available over the weekend for ID questions.    Rexene Alberts, MSN, NP-C [facility] for Infectious Disease [facility]  [email or web address] Pager: [phone] Office: [phone] [facility] Main Line: [phone]

## 2020-12-05 NOTE — Progress Notes (Signed)
Occupational Therapy Treatment Patient Details Name: Anne Bautista MRN: [id number] DOB: 04-13-1986 Today's Date: 12/05/2020    History of present illness 35 yo admitted 2/14 with sepsis due to MRSA bacteremia in setting of IVDU. Rt shoulder pain s/p aspiration 2/14. PMHx: IVDU, malnutrition, poor dentition. MRI results of R shoulder suggestive of osteomyelitis versus reactive marrow seen through the lateral corner of the humeral head. No osseous fracture. Multilocular complex collection seen within the subdeltoid recess extending around the deltoid musculature posteriorly which could be due to infectious/inflammatory bursitis.   OT comments  Patient supine in bed when therapist entered the room. Reports pain in shoulder is less and RUE somewhat functional today. Patient agreeable to get up to chair today. Patient supervision for all bed mobility - patient removed purewick and removed wet bed pad underneath her by bridging and then transferred to side of bed. Patient min guard to stand and take steps to recliner with RW. Patient able to manage food tray - opening drinks and packages. Reports she is able to feed herself with her right hand. In discussing discharge plan patient reports wanting to go home. Therapist educated patient that she needs to demonstrate ability to ambulate and take care of herself if she wants to go home. She also reports her mother can help her. Updated plan to SNF vs [facility] - pending patient progress. Patient's ability to perform today suggests she is able to perform more functionally than she has been. Cont POC   Follow Up Recommendations  SNF;Home health OT;Supervision - Intermittent    Equipment Recommendations  None recommended by OT    Recommendations for Other Services      Precautions / Restrictions Precautions Precautions: Fall Precaution Comments: right shoulder, left ankle, back pain, watch sats/RR, bil UE edema Restrictions Weight Bearing Restrictions: No        Mobility Bed Mobility Overal bed mobility: Needs Assistance Bed Mobility: Supine to Sit     Supine to sit: Supervision     General bed mobility comments: supervision for all bed mobility - including removing purewick prior to movement, bridging to remove pad underneath her.    Transfers Overall transfer level: Needs assistance Equipment used: Rolling walker (2 wheeled) Transfers: Sit to/from [employer] Sit to Stand: Min guard Stand pivot transfers: Min guard       General transfer comment: min guard to stand and take steps to recliner with Rw - declined further activity.    Balance Overall balance assessment: Mild deficits observed, not formally tested                                         ADL either performed or assessed with clinical judgement   ADL Overall ADL's : Needs assistance/impaired Eating/Feeding: Independent Eating/Feeding Details (indicate cue type and reason): Patient independent with food set up today - including opening soda can.                                         Vision Patient Visual Report: No change from baseline     Perception     Praxis      Cognition Arousal/Alertness: Awake/alert Behavior During Therapy: WFL for tasks assessed/performed Overall Cognitive Status: Within Functional Limits for tasks assessed  Exercises     Shoulder Instructions       General Comments      Pertinent Vitals/ Pain       Pain Assessment: Faces Faces Pain Scale: Hurts little more Pain Location: generalized Pain Descriptors / Indicators: Grimacing;Moaning  Home Living                                          Prior Functioning/Environment              Frequency  Min 2X/week        Progress Toward Goals  OT Goals(current goals can now be found in the care plan section)  Progress towards OT goals:  Progressing toward goals  Acute Rehab OT Goals Patient Stated Goal: to go home OT Goal Formulation: With patient Time For Goal Achievement: 12/15/20 Potential to Achieve Goals: Good  Plan Discharge plan remains appropriate;Frequency remains appropriate    Co-evaluation          OT goals addressed during session:  (functional mobility)      AM-PAC OT "6 Clicks" Daily Activity     Outcome Measure   Help from another person eating meals?: None Help from another person taking care of personal grooming?: A Little Help from another person toileting, which includes using toliet, bedpan, or urinal?: A Lot Help from another person bathing (including washing, rinsing, drying)?: A Lot Help from another person to put on and taking off regular upper body clothing?: A Lot Help from another person to put on and taking off regular lower body clothing?: A Lot 6 Click Score: 15    End of Session Equipment Utilized During Treatment: Rolling walker  OT Visit Diagnosis: Unsteadiness on feet (R26.81);Muscle weakness (generalized) (M62.81);Other symptoms and signs involving cognitive function;Pain Pain - Right/Left: Right Pain - part of body: Shoulder   Activity Tolerance Patient tolerated treatment well   Patient Left in chair;with call bell/phone within reach;with chair alarm set   Nurse Communication Mobility status        Time: 1740-8144 OT Time Calculation (min): 17 min  Charges: OT General Charges $OT Visit: 1 Visit OT Treatments $Therapeutic Activity: 8-22 mins  Vonda, OTR/L Acute Care Rehab Services  Office [phone] Pager: [phone]    Kelli Churn 12/05/2020, 3:59 PM

## 2020-12-05 NOTE — Anesthesia Postprocedure Evaluation (Signed)
Anesthesia Post Note  Patient: Anne Bautista  Procedure(s) Performed: MRI WITH ANESTHESIA (Right Shoulder)     Patient location during evaluation: PACU Anesthesia Type: General Level of consciousness: awake and alert Pain management: pain level controlled Vital Signs Assessment: post-procedure vital signs reviewed and stable Respiratory status: spontaneous breathing, nonlabored ventilation, respiratory function stable and patient connected to nasal cannula oxygen Cardiovascular status: blood pressure returned to baseline and stable Postop Assessment: no apparent nausea or vomiting Anesthetic complications: no   No complications documented.  Last Vitals:  Vitals:   12/05/20 0413 12/05/20 0850  BP: 106/82 117/71  Pulse: (!) 117 (!) 108  Resp:  18  Temp: 36.8 C 37.8 C  SpO2: 95% 93%    Last Pain:  Vitals:   12/05/20 0850  TempSrc: Oral  PainSc:                  Kennieth Rad

## 2020-12-05 NOTE — Progress Notes (Signed)
PROGRESS NOTE    Anne Bautista  ZOX:[id number] DOB: Sep 14, 1986 DOA: 11/15/2020 PCP: Patient, No Pcp Per   Chief Complaint  Patient presents with  . 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  She was seen by infectious disease and CT surgery and orthopedics See her once stabilized and transferred to [facility] service 2/22 She remains on IV antibiotics and planned for x6 weeks MRI rt shoulder 3/3  Subjective: No new complaints. Afebrile. Pain on Rt shoulder.   Assessment & Plan:  Severe sepsis due to MRSA POA (sepsis resolved) Infectious traction for endocarditis 2/2 IVDA Septic emboli to both lung kidneys shoulder possible left ankle Septic right shoulder aspiration positive for MRSA Possible septic left ankle (MRI-refused by patient) Right subdeltoid abscess and reactive marrow edema 3/3 MRI+: Patient had extensive evaluation work-up and seen by orthopedics, CT surgery,ID, PCCM. She remains on IV daptomycin pharmacy dosing with weekly CK on Sunday, PICC line since 2/28.Last blood culture negative on 2/18.Cardiac surgery following for possible angiovac debridement once sepsis has cleared. Ortho following-MRI Rt shoulder 3/3 positive for subdeltoid abscess and reactive marrow edema within the proximal humerus and effusion of the glenohumeral joint. Planning for I&D tomorrow n.p.o. past midnight.   IV drug abuse/cocaine use marijuana use tobacco dependence UDS positive for cocaine, hep C negative HIV negative RPR negative, cessation was encouraged.  No signs of withdrawal  Acute hypoxic respiratory failure/narcotic withdrawal with bilateral embolic pneumonia: Currently on room air.  Acute blood  loss anemia/iron deficiency anemia status post PRBC transfusion 2/22 and 2/23.  Monitor and transfuse if less than 7 g.  Holding in 7 to 8 g range. Recent Labs  Lab 11/29/20 0051 11/30/20 0637 12/01/20 0026 12/02/20 0150 12/03/20 0440  HGB 8.1* 8.3* 8.1* 7.1* 7.4*  HCT 24.7* 25.4* 26.4* 22.6* 22.7*   Acute cystitis without hematuria culture positive for MRSA, was treated with antibiotics.  Nonoliguric AKI with ATN, creatinine peaked to 2.8.improved to 1.5. Recent Labs  Lab 11/29/20 0051 11/30/20 0637 12/01/20 0026 12/02/20 0150 12/03/20 0440  BUN 42* 37* 35* 35* 31*  CREATININE 2.10* 1.77* 1.55* 1.64* 1.51*   Hypertension: Controlled continue Norvasc clonidine.  Mild tachycardia.  GERD on PPI  Chronic constipation, continue laxatives x-ray 2/28 - for ileus or obstruction  Debility/deconditioning mobilize.  Moderate protein calorie malnutrition see below. FEN: Cortrak got  dislodged, continue speech and dietitian on board she seems to be tolerating p.o.  Plan of care discussed with the patient she understands the gravity of her illness and the cause behind it - we talked about need to stay in the hospital and complete IV antibiotics she understands if she does not finish the course she is at risk of worsening sepsis septic shock which could lead into multiorgan failure and death. I have educated and counseled her not to leave AGAINST MEDICAL ADVICE at any point before completing treatment. Monitor closely at risk of decompensation.  Diet Order            Diet NPO time specified  Diet effective ____           Diet NPO time specified  Diet effective midnight  Diet regular Room service appropriate? Yes; Fluid consistency: Thin  Diet effective now                 Nutrition Problem: Moderate Malnutrition Etiology: social / environmental circumstances (IVDU) Signs/Symptoms: mild fat depletion,moderate fat depletion,mild muscle depletion,moderate muscle  depletion Interventions: Ensure Enlive (each supplement provides 350kcal and 20 grams of protein),MVI Patient's Body mass index is 25.97 kg/m.   DVT prophylaxis: heparin injection 5,000 Units Start: 11/20/20 1400 SCDs Start: 11/16/20 1257 Code Status:   Code Status: Full Code Family Communication: plan of care discussed with patient at bedside. Lived w boyfired and self updating  Status is: Inpatient Remains inpatient appropriate because:Unsafe d/c plan, IV treatments appropriate due to intensity of illness or inability to take PO and Inpatient level of care appropriate due to severity of illness  Dispo: The patient is from: Home              Anticipated d/c is to: TBD              Patient currently is not medically stable to d/c.   Difficult to place patient Yes   Unresulted Labs (From admission, onward)          Start     Ordered   11/30/20 0500  CK  Weekly,   R     Question:  Specimen collection method  Answer:  Lab=Lab collect   11/29/20 1242          Medications reviewed: Scheduled Meds: . amLODipine  10 mg Oral Daily  . chlorhexidine  15 mL Mouth Rinse BID  . Chlorhexidine Gluconate Cloth  6 each Topical Daily  . cloNIDine  0.1 mg Oral BID  . dicyclomine  20 mg Oral TID AC & HS  . docusate sodium  100 mg Oral BID  . feeding supplement  237 mL Oral QID  . folic acid  1 mg Oral Daily  . heparin  5,000 Units Subcutaneous Q8H  . mouth rinse  15 mL Mouth Rinse q12n4p  . methadone  10 mg Oral Q8H  . multivitamin with minerals  1 tablet Oral Daily  . pantoprazole  40 mg Oral Daily  . polyethylene glycol  17 g Oral BID  . QUEtiapine  25 mg Oral BID  . senna  2 tablet Oral Daily  . sodium chloride flush  10-40 mL Intracatheter Q12H  . sodium chloride flush  10-40 mL Intracatheter Q12H  . sodium chloride flush  3 mL Intravenous Q12H  . thiamine  100 mg Oral Daily   Continuous Infusions: . sodium chloride Stopped (12/05/20 0656)  . DAPTOmycin (CUBICIN)  IV Stopped  (12/05/20 7322)    Consultants:see note  Procedures:see note  Antimicrobials: Anti-infectives (From admission, onward)   Start     Dose/Rate Route Frequency Ordered Stop   11/28/20 2000  DAPTOmycin (CUBICIN) 764 mg in sodium chloride 0.9 % IVPB        10 mg/kg  76.4 kg 230.6 mL/hr over 30 Minutes Intravenous Daily 11/28/20 0727     11/24/20 2000  DAPTOmycin (CUBICIN) 827 mg in sodium chloride 0.9 % IVPB  Status:  Discontinued        10 mg/kg  82.7 kg 233.1 mL/hr over 30 Minutes Intravenous Daily 11/24/20 0821 11/28/20 0727   11/19/20 1000  DAPTOmycin (CUBICIN) 770 mg in sodium chloride 0.9 % IVPB  Status:  Discontinued        10 mg/kg  77 kg 230.8 mL/hr over 30 Minutes  Intravenous Daily 11/19/20 0908 11/24/20 0821   11/19/20 1000  doxycycline (VIBRA-TABS) tablet 100 mg        100 mg Oral Every 12 hours 11/19/20 0908 11/23/20 2123   11/19/20 0439  vancomycin variable dose per unstable renal function (pharmacist dosing)  Status:  Discontinued         Does not apply See admin instructions 11/19/20 0439 11/19/20 0853   11/16/20 2000  vancomycin (VANCOREADY) IVPB 1250 mg/250 mL  Status:  Discontinued        1,250 mg 166.7 mL/hr over 90 Minutes Intravenous Every 24 hours 11/15/20 1933 11/16/20 1108   11/16/20 1200  vancomycin (VANCOCIN) IVPB 1000 mg/200 mL premix  Status:  Discontinued        1,000 mg 200 mL/hr over 60 Minutes Intravenous Every 12 hours 11/16/20 1108 11/19/20 0439   11/16/20 0745  metroNIDAZOLE (FLAGYL) IVPB 500 mg        500 mg 100 mL/hr over 60 Minutes Intravenous  Once 11/16/20 0741 11/16/20 0907   11/15/20 2237  vancomycin (VANCOCIN) 500 MG powder       Note to Pharmacy: Ihor Dow   : cabinet override      11/15/20 2237 11/16/20 1044   11/15/20 2000  ceFEPIme (MAXIPIME) 2 g in sodium chloride 0.9 % 100 mL IVPB  Status:  Discontinued        2 g 200 mL/hr over 30 Minutes Intravenous Every 12 hours 11/15/20 1933 11/16/20 1212   11/15/20 1945  vancomycin  (VANCOREADY) IVPB 1500 mg/300 mL  Status:  Discontinued        1,500 mg 150 mL/hr over 120 Minutes Intravenous  Once 11/15/20 1933 11/15/20 1939   11/15/20 1945  vancomycin (VANCOCIN) IVPB 1000 mg/200 mL premix       "And" Linked Group Details   1,000 mg 200 mL/hr over 60 Minutes Intravenous  Once 11/15/20 1939 11/15/20 2231   11/15/20 1945  vancomycin (VANCOREADY) IVPB 500 mg/100 mL       "And" Linked Group Details   500 mg 100 mL/hr over 60 Minutes Intravenous  Once 11/15/20 1939 11/16/20 0002     Culture/Microbiology    Component Value Date/Time   SDES BLOOD RIGHT HAND 11/21/2020 0526   SDES BLOOD LEFT HAND 11/21/2020 0526   SPECREQUEST  11/21/2020 0526    BOTTLES DRAWN AEROBIC ONLY Blood Culture adequate volume   SPECREQUEST  11/21/2020 0526    BOTTLES DRAWN AEROBIC ONLY Blood Culture adequate volume   CULT  11/21/2020 0526    NO GROWTH 5 DAYS Performed at [facility], [address], [place], [place] [address]    CULT  11/21/2020 0526    NO GROWTH 5 DAYS Performed at [facility], [address] [place], [place] [address]    REPTSTATUS 11/26/2020 FINAL 11/21/2020 0526   REPTSTATUS 11/26/2020 FINAL 11/21/2020 0526    Other culture-see note  Objective: Vitals: Today's Vitals   12/04/20 2242 12/04/20 2348 12/05/20 0413 12/05/20 0850  BP: (!) 152/82 124/68 106/82 117/71  Pulse:  (!) 110 (!) 117 (!) 108  Resp:  18  18  Temp:  97.6 F (36.4 C) 98.2 F (36.8 C) 100 F (37.8 C)  TempSrc:   Oral Oral  SpO2:  96% 95% 93%  Weight:      Height:      PainSc:        Intake/Output Summary (Last 24 hours) at 12/05/2020 1219 Last data filed at 12/04/2020 2229 Gross  per 24 hour  Intake 710 ml  Output --  Net 710 ml   Filed Weights   11/30/20 0500 12/01/20 0328 12/02/20 0300  Weight: 77.6 kg 74 kg 75.2 kg   Weight change:   Intake/Output from previous day: 03/03 0701 - 03/04 0700 In: 710 [I.V.:710] Out: -  Intake/Output this shift: No intake/output  data recorded. Filed Weights   11/30/20 0500 12/01/20 0328 12/02/20 0300  Weight: 77.6 kg 74 kg 75.2 kg    Examination: General exam: AAO x3, old for age, NAD, weak appearing. HEENT:Oral mucosa moist, Ear/Nose WNL grossly, dentition normal. Respiratory system: bilaterally clear,no wheezing or crackles,no use of accessory muscle Cardiovascular system: S1 & S2 +, No JVD,. Gastrointestinal system: Abdomen soft, NT,ND, BS+ Nervous System:Alert, awake, moving extremities and grossly nonfocal Extremities: Tender right shoulder and left ankle no edema, distal peripheral pulses palpable.  Skin: No rashes,no icterus. MSK: Normal muscle bulk,tone, power  Data Reviewed: I have personally reviewed following labs and imaging studies CBC: Recent Labs  Lab 11/29/20 0051 11/30/20 0637 12/01/20 0026 12/02/20 0150 12/03/20 0440  WBC 10.8* 10.2 9.2 8.3 12.1*  HGB 8.1* 8.3* 8.1* 7.1* 7.4*  HCT 24.7* 25.4* 26.4* 22.6* 22.7*  MCV 82.3 83.6 84.3 84.0 82.5  PLT 208 218 215 204 283   Basic Metabolic Panel: Recent Labs  Lab 11/29/20 0051 11/30/20 0637 12/01/20 0026 12/02/20 0150 12/03/20 0440  NA 136 138 137 136 136  K 4.2 4.3 4.3 4.2 4.4  CL 104 102 103 102 103  CO2 23 26 24 25 24   GLUCOSE 126* 120* 122* 120* 106*  BUN 42* 37* 35* 35* 31*  CREATININE 2.10* 1.77* 1.55* 1.64* 1.51*  CALCIUM 8.0* 8.4* 8.1* 8.1* 8.1*  MG  --   --   --   --  2.2   GFR: Estimated Creatinine Clearance: 55 mL/min (A) (by C-G formula based on SCr of 1.51 mg/dL (H)). Liver Function Tests: Recent Labs  Lab 12/02/20 0150  AST 41  ALT 29  ALKPHOS 98  BILITOT 0.6  PROT 7.8  ALBUMIN 1.6*   No results for input(s): LIPASE, AMYLASE in the last 168 hours. No results for input(s): AMMONIA in the last 168 hours. Coagulation Profile: No results for input(s): INR, PROTIME in the last 168 hours. Cardiac Enzymes: Recent Labs  Lab 11/30/20 0637  CKTOTAL 23*   BNP (last 3 results) No results for input(s):  PROBNP in the last 8760 hours. HbA1C: No results for input(s): HGBA1C in the last 72 hours. CBG: Recent Labs  Lab 12/04/20 0019 12/04/20 0419 12/04/20 0808 12/04/20 1058 12/04/20 1539  GLUCAP 146* 98 94 96 97   Lipid Profile: No results for input(s): CHOL, HDL, LDLCALC, TRIG, CHOLHDL, LDLDIRECT in the last 72 hours. Thyroid Function Tests: No results for input(s): TSH, T4TOTAL, FREET4, T3FREE, THYROIDAB in the last 72 hours. Anemia Panel: No results for input(s): VITAMINB12, FOLATE, FERRITIN, TIBC, IRON, RETICCTPCT in the last 72 hours. Sepsis Labs: No results for input(s): PROCALCITON, LATICACIDVEN in the last 168 hours.  No results found for this or any previous visit (from the past 240 hour(s)).   Radiology Studies: MR SHOULDER RIGHT WO CONTRAST  Result Date: 12/04/2020 CLINICAL DATA:  Shoulder pain EXAM: MRI OF THE RIGHT SHOULDER WITHOUT CONTRAST TECHNIQUE: Multiplanar, multisequence MR imaging of the shoulder was performed. No intravenous contrast was administered. COMPARISON:  None. FINDINGS: Bones/Joint/Cartilage There is heterogeneous T2 hyperintense signal with T1 hypointensity seen at the lateral corner of the humeral head. No  osseous fracture however seen. There is a small glenohumeral joint effusion with edema around the inferior capsule. There TICU are surface however appears to be maintained. Ligaments Suboptimally visualized Muscles and Tendons There is diffuse edema seen within the muscles surrounding the shoulder. Within the subcoracoid subdeltoid bursa there is multilocular complex fluid collection with scattered debris that appears to encompass the deltoid musculature and extend posteriorly. There is significant increased signal seen throughout the deltoid musculature. Soft tissue Subcutaneous edema is seen surrounding the shoulder IMPRESSION: Findings which could be suggestive of osteomyelitis versus reactive marrow seen through the lateral corner of the humeral head. No  osseous fracture. Multilocular complex collection seen within the subdeltoid recess extending around the deltoid musculature posteriorly which could be due to infectious/inflammatory bursitis Diffuse muscular edema/myositis surrounding the shoulder. Electronically Signed   By: Jonna Clark M.D.   On: 12/04/2020 17:20     LOS: 19 days   Lanae Boast, MD [facility]  12/05/2020, 12:19 PM

## 2020-12-05 NOTE — Progress Notes (Signed)
PT Cancellation Note  Patient Details Name: Anne Bautista MRN: [id number] DOB: 12-May-1986   Cancelled Treatment:    Reason Eval/Treat Not Completed: Patient declined, no reason specified Upon arrival, patient laying in bed with husband and politely declined. Educated patient on importance and benefits of mobility. Patient declined. Informed patient that she may not been seen over the weekend, patient continued to decline. PT will re-attempt as time allows.   Shirleymae Hauth A. Dan Humphreys PT, DPT Acute Rehabilitation Services Pager [phone] Office [phone]    Viviann Spare 12/05/2020, 3:43 PM

## 2020-12-06 ENCOUNTER — Inpatient Hospital Stay (HOSPITAL_COMMUNITY): Payer: Self-pay | Admitting: Anesthesiology

## 2020-12-06 ENCOUNTER — Encounter (HOSPITAL_COMMUNITY)
Admission: EM | Discharge status: Against Medical Advice | Payer: Self-pay | Source: Home / Self Care | Attending: Internal Medicine

## 2020-12-06 HISTORY — PX: INCISION AND DRAINAGE OF WOUND: SHX1803

## 2020-12-06 LAB — GLUCOSE, CAPILLARY
Glucose-Capillary: 134 mg/dL — ABNORMAL HIGH (ref 70–99)
Glucose-Capillary: 143 mg/dL — ABNORMAL HIGH (ref 70–99)
Glucose-Capillary: 83 mg/dL (ref 70–99)
Glucose-Capillary: 84 mg/dL (ref 70–99)

## 2020-12-06 SURGERY — IRRIGATION AND DEBRIDEMENT WOUND
Anesthesia: General | Site: Shoulder | Laterality: Right

## 2020-12-06 MED ORDER — OXYCODONE HCL 5 MG/5ML PO SOLN: 5.0000 mg | Freq: Once | ORAL | Status: DC | PRN

## 2020-12-06 MED ORDER — MIDAZOLAM HCL 2 MG/2ML IJ SOLN
INTRAMUSCULAR | Status: DC | PRN
  Administered 2020-12-06: 2 mg via INTRAVENOUS

## 2020-12-06 MED ORDER — HYDROMORPHONE HCL 1 MG/ML IJ SOLN
INTRAMUSCULAR | Status: AC
  Filled 2020-12-06: qty 1

## 2020-12-06 MED ORDER — ONDANSETRON HCL 4 MG/2ML IJ SOLN
INTRAMUSCULAR | Status: DC | PRN
  Administered 2020-12-06: 4 mg via INTRAVENOUS

## 2020-12-06 MED ORDER — ACETAMINOPHEN 10 MG/ML IV SOLN
INTRAVENOUS | Status: AC
  Filled 2020-12-06: qty 100

## 2020-12-06 MED ORDER — DEXAMETHASONE SODIUM PHOSPHATE 10 MG/ML IJ SOLN
INTRAMUSCULAR | Status: AC
  Filled 2020-12-06: qty 1

## 2020-12-06 MED ORDER — KETAMINE HCL 50 MG/5ML IJ SOSY
PREFILLED_SYRINGE | INTRAMUSCULAR | Status: AC
  Filled 2020-12-06: qty 5

## 2020-12-06 MED ORDER — LACTATED RINGERS IV SOLN: INTRAVENOUS | Status: DC | PRN

## 2020-12-06 MED ORDER — PROPOFOL 10 MG/ML IV BOLUS
INTRAVENOUS | Status: AC
  Filled 2020-12-06: qty 20

## 2020-12-06 MED ORDER — PROPOFOL 10 MG/ML IV BOLUS
INTRAVENOUS | Status: DC | PRN
  Administered 2020-12-06: 200 mg via INTRAVENOUS

## 2020-12-06 MED ORDER — LACTATED RINGERS IV SOLN: INTRAVENOUS | Status: DC

## 2020-12-06 MED ORDER — ACETAMINOPHEN 10 MG/ML IV SOLN
1000.0000 mg | Freq: Once | INTRAVENOUS | Status: DC | PRN
  Administered 2020-12-06: 1000 mg via INTRAVENOUS

## 2020-12-06 MED ORDER — SODIUM CHLORIDE 0.9 % IR SOLN
Status: DC | PRN
  Administered 2020-12-06: 3000 mL

## 2020-12-06 MED ORDER — CEFAZOLIN SODIUM-DEXTROSE 2-4 GM/100ML-% IV SOLN
2.0000 g | INTRAVENOUS | Status: AC
  Administered 2020-12-06: 2 g via INTRAVENOUS

## 2020-12-06 MED ORDER — PROMETHAZINE HCL 25 MG/ML IJ SOLN: 6.2500 mg | INTRAMUSCULAR | Status: DC | PRN

## 2020-12-06 MED ORDER — KETOROLAC TROMETHAMINE 30 MG/ML IJ SOLN
30.0000 mg | Freq: Once | INTRAMUSCULAR | Status: AC
  Administered 2020-12-06: 30 mg via INTRAVENOUS

## 2020-12-06 MED ORDER — LIDOCAINE 2% (20 MG/ML) 5 ML SYRINGE
INTRAMUSCULAR | Status: AC
  Filled 2020-12-06: qty 5

## 2020-12-06 MED ORDER — ONDANSETRON HCL 4 MG/2ML IJ SOLN
INTRAMUSCULAR | Status: AC
  Filled 2020-12-06: qty 2

## 2020-12-06 MED ORDER — VANCOMYCIN HCL 1000 MG IV SOLR
INTRAVENOUS | Status: AC
  Filled 2020-12-06: qty 1000

## 2020-12-06 MED ORDER — VANCOMYCIN HCL 1000 MG IV SOLR
INTRAVENOUS | Status: DC | PRN
  Administered 2020-12-06: 1000 mg via TOPICAL

## 2020-12-06 MED ORDER — PHENYLEPHRINE HCL-NACL 10-0.9 MG/250ML-% IV SOLN
INTRAVENOUS | Status: DC | PRN
  Administered 2020-12-06: 20 ug/min via INTRAVENOUS

## 2020-12-06 MED ORDER — DEXAMETHASONE SODIUM PHOSPHATE 10 MG/ML IJ SOLN
INTRAMUSCULAR | Status: DC | PRN
  Administered 2020-12-06: 5 mg via INTRAVENOUS

## 2020-12-06 MED ORDER — KETOROLAC TROMETHAMINE 30 MG/ML IJ SOLN
INTRAMUSCULAR | Status: AC
  Filled 2020-12-06: qty 1

## 2020-12-06 MED ORDER — KETAMINE HCL 10 MG/ML IJ SOLN
INTRAMUSCULAR | Status: DC | PRN
  Administered 2020-12-06: 20 mg via INTRAVENOUS

## 2020-12-06 MED ORDER — MIDAZOLAM HCL 2 MG/2ML IJ SOLN
INTRAMUSCULAR | Status: AC
  Filled 2020-12-06: qty 2

## 2020-12-06 MED ORDER — FENTANYL CITRATE (PF) 250 MCG/5ML IJ SOLN
INTRAMUSCULAR | Status: DC | PRN
  Administered 2020-12-06 (×2): 50 ug via INTRAVENOUS
  Administered 2020-12-06: 25 ug via INTRAVENOUS
  Administered 2020-12-06: 50 ug via INTRAVENOUS
  Administered 2020-12-06: 25 ug via INTRAVENOUS

## 2020-12-06 MED ORDER — LIDOCAINE 2% (20 MG/ML) 5 ML SYRINGE
INTRAMUSCULAR | Status: DC | PRN
  Administered 2020-12-06: 60 mg via INTRAVENOUS

## 2020-12-06 MED ORDER — 0.9 % SODIUM CHLORIDE (POUR BTL) OPTIME
TOPICAL | Status: DC | PRN
  Administered 2020-12-06: 1000 mL

## 2020-12-06 MED ORDER — FENTANYL CITRATE (PF) 250 MCG/5ML IJ SOLN
INTRAMUSCULAR | Status: AC
  Filled 2020-12-06: qty 5

## 2020-12-06 MED ORDER — OXYCODONE HCL 5 MG PO TABS
5.0000 mg | ORAL_TABLET | Freq: Once | ORAL | Status: DC | PRN
Start: 2020-12-06 — End: 2020-12-06

## 2020-12-06 MED ORDER — HYDROMORPHONE HCL 1 MG/ML IJ SOLN
0.2500 mg | INTRAMUSCULAR | Status: DC | PRN
  Administered 2020-12-06 (×2): 0.5 mg via INTRAVENOUS

## 2020-12-06 MED ORDER — DEXMEDETOMIDINE (PRECEDEX) IN NS 20 MCG/5ML (4 MCG/ML) IV SYRINGE
PREFILLED_SYRINGE | INTRAVENOUS | Status: DC | PRN
  Administered 2020-12-06: 4 ug via INTRAVENOUS
  Administered 2020-12-06 (×2): 8 ug via INTRAVENOUS

## 2020-12-06 SURGICAL SUPPLY — 49 items
BANDAGE ESMARK 6X9 LF (GAUZE/BANDAGES/DRESSINGS) IMPLANT
BNDG COHESIVE 4X5 TAN STRL (GAUZE/BANDAGES/DRESSINGS) ×2 IMPLANT
BNDG ELASTIC 6X10 VLCR STRL LF (GAUZE/BANDAGES/DRESSINGS) IMPLANT
BNDG ESMARK 4X9 LF (GAUZE/BANDAGES/DRESSINGS) IMPLANT
BNDG ESMARK 6X9 LF (GAUZE/BANDAGES/DRESSINGS)
BNDG GAUZE ELAST 4 BULKY (GAUZE/BANDAGES/DRESSINGS) IMPLANT
CHLORAPREP W/TINT 26 (MISCELLANEOUS) ×2 IMPLANT
COVER SURGICAL LIGHT HANDLE (MISCELLANEOUS) ×2 IMPLANT
COVER WAND RF STERILE (DRAPES) IMPLANT
CUFF TOURN SGL QUICK 34 (TOURNIQUET CUFF)
CUFF TRNQT CYL 34X4.125X (TOURNIQUET CUFF) IMPLANT
DRAIN CHANNEL 19F RND (DRAIN) ×2 IMPLANT
DRAPE U-SHAPE 47X51 STRL (DRAPES) IMPLANT
DRSG MEPILEX BORDER 4X8 (GAUZE/BANDAGES/DRESSINGS) ×2 IMPLANT
DRSG PAD ABDOMINAL 8X10 ST (GAUZE/BANDAGES/DRESSINGS) IMPLANT
DRSG XEROFORM 1X8 (GAUZE/BANDAGES/DRESSINGS) IMPLANT
DURAPREP 26ML APPLICATOR (WOUND CARE) IMPLANT
ELECT REM PT RETURN 9FT ADLT (ELECTROSURGICAL) ×2
ELECTRODE REM PT RTRN 9FT ADLT (ELECTROSURGICAL) ×1 IMPLANT
EVACUATOR SILICONE 100CC (DRAIN) ×2 IMPLANT
GAUZE SPONGE 4X4 12PLY STRL (GAUZE/BANDAGES/DRESSINGS) IMPLANT
GAUZE SPONGE 4X4 12PLY STRL LF (GAUZE/BANDAGES/DRESSINGS) IMPLANT
GAUZE XEROFORM 1X8 LF (GAUZE/BANDAGES/DRESSINGS) IMPLANT
GLOVE BIOGEL M STRL SZ7.5 (GLOVE) ×2 IMPLANT
GLOVE INDICATOR 8.0 STRL GRN (GLOVE) ×2 IMPLANT
GOWN STRL REUS W/ TWL LRG LVL3 (GOWN DISPOSABLE) ×1 IMPLANT
GOWN STRL REUS W/ TWL XL LVL3 (GOWN DISPOSABLE) ×1 IMPLANT
GOWN STRL REUS W/TWL LRG LVL3 (GOWN DISPOSABLE) ×1
GOWN STRL REUS W/TWL XL LVL3 (GOWN DISPOSABLE) ×1
HANDPIECE INTERPULSE COAX TIP (DISPOSABLE) ×1
IV NS IRRIG 3000ML ARTHROMATIC (IV SOLUTION) ×2 IMPLANT
KIT BASIN OR (CUSTOM PROCEDURE TRAY) ×2 IMPLANT
KIT TURNOVER KIT B (KITS) ×2 IMPLANT
MANIFOLD NEPTUNE II (INSTRUMENTS) ×2 IMPLANT
NEEDLE 22X1 1/2 (OR ONLY) (NEEDLE) IMPLANT
NS IRRIG 1000ML POUR BTL (IV SOLUTION) ×2 IMPLANT
PACK ORTHO EXTREMITY (CUSTOM PROCEDURE TRAY) ×2 IMPLANT
PAD ARMBOARD 7.5X6 YLW CONV (MISCELLANEOUS) ×4 IMPLANT
PAD CAST 4YDX4 CTTN HI CHSV (CAST SUPPLIES) IMPLANT
PADDING CAST COTTON 4X4 STRL (CAST SUPPLIES)
SET HNDPC FAN SPRY TIP SCT (DISPOSABLE) ×1 IMPLANT
SPONGE LAP 18X18 RF (DISPOSABLE) ×2 IMPLANT
STOCKINETTE IMPERVIOUS 9X36 MD (GAUZE/BANDAGES/DRESSINGS) ×2 IMPLANT
SUT ETHILON 2 0 FS 18 (SUTURE) ×4 IMPLANT
SUT MNCRL AB 3-0 PS2 18 (SUTURE) ×2 IMPLANT
SWAB CULTURE ESWAB REG 1ML (MISCELLANEOUS) ×2 IMPLANT
TUBE CONNECTING 12X1/4 (SUCTIONS) ×2 IMPLANT
UNDERPAD 30X36 HEAVY ABSORB (UNDERPADS AND DIAPERS) IMPLANT
YANKAUER SUCT BULB TIP NO VENT (SUCTIONS) ×2 IMPLANT

## 2020-12-06 NOTE — Consult Note (Signed)
Reason for Consult: Right shoulder pain Referring Physician:   [name] is an 35 y.o. female.  HPI: Patient is an IV drug abuser who was admitted with sepsis and cardiac vegetations left ankle pain and right shoulder pain.  Aspiration of the right shoulder was positive for rare MRSA however this was thought to be a contaminant due to patient's exam was not consistent consistent with septic arthritis.  At the time of the aspiration patient with septic.  She had continued shoulder pain and therefore MRI scan was ordered which demonstrated a subdeltoid abscess.  On questioning patient reports having a cyst in her right axilla about 1 month ago that she decompressed on her own.  She has been having progressively worsening shoulder pain since then.  Past Medical History:  Diagnosis Date  . IV drug user   . Malnutrition (HCC) 11/16/2020  . Poor dentition 11/16/2020  . Tobacco dependence 11/16/2020    Past Surgical History:  Procedure Laterality Date  . RADIOLOGY WITH ANESTHESIA Right 12/04/2020   Procedure: MRI WITH ANESTHESIA;  Surgeon: Radiologist, Medication, MD;  Location: [facility] OR;  Service: Radiology;  Laterality: Right;    History reviewed. No pertinent family history.  Social History:  reports that she has been smoking cigarettes. She has a 18.00 pack-year smoking history. She has never used smokeless tobacco. She reports current drug use. Drugs: Cocaine and Marijuana. She reports that she does not drink alcohol.  Allergies: No Known Allergies  Medications: I have reviewed the patient's current medications.  Results for orders placed or performed during the hospital encounter of 11/15/20 (from the past 48 hour(s))  Glucose, capillary     Status: None   Collection Time: 12/04/20  8:08 AM  Result Value Ref Range   Glucose-Capillary 94 70 - 99 mg/dL    Comment: Glucose reference range applies only to samples taken after fasting for at least 8 hours.  Glucose, capillary     Status: None    Collection Time: 12/04/20 10:58 AM  Result Value Ref Range   Glucose-Capillary 96 70 - 99 mg/dL    Comment: Glucose reference range applies only to samples taken after fasting for at least 8 hours.  Glucose, capillary     Status: None   Collection Time: 12/04/20  3:39 PM  Result Value Ref Range   Glucose-Capillary 97 70 - 99 mg/dL    Comment: Glucose reference range applies only to samples taken after fasting for at least 8 hours.  Glucose, capillary     Status: None   Collection Time: 12/05/20 12:51 PM  Result Value Ref Range   Glucose-Capillary 88 70 - 99 mg/dL    Comment: Glucose reference range applies only to samples taken after fasting for at least 8 hours.  Glucose, capillary     Status: None   Collection Time: 12/05/20  8:37 PM  Result Value Ref Range   Glucose-Capillary 93 70 - 99 mg/dL    Comment: Glucose reference range applies only to samples taken after fasting for at least 8 hours.   Comment 1 Notify RN    Comment 2 Document in Chart   Glucose, capillary     Status: None   Collection Time: 12/06/20 12:28 AM  Result Value Ref Range   Glucose-Capillary 83 70 - 99 mg/dL    Comment: Glucose reference range applies only to samples taken after fasting for at least 8 hours.   Comment 1 Notify RN    Comment 2 Document in Chart  Glucose, capillary     Status: None   Collection Time: 12/06/20  4:57 AM  Result Value Ref Range   Glucose-Capillary 84 70 - 99 mg/dL    Comment: Glucose reference range applies only to samples taken after fasting for at least 8 hours.   Comment 1 Notify RN    Comment 2 Document in Chart     MR SHOULDER RIGHT WO CONTRAST  Result Date: 12/04/2020 CLINICAL DATA:  Shoulder pain EXAM: MRI OF THE RIGHT SHOULDER WITHOUT CONTRAST TECHNIQUE: Multiplanar, multisequence MR imaging of the shoulder was performed. No intravenous contrast was administered. COMPARISON:  None. FINDINGS: Bones/Joint/Cartilage There is heterogeneous T2 hyperintense signal with T1  hypointensity seen at the lateral corner of the humeral head. No osseous fracture however seen. There is a small glenohumeral joint effusion with edema around the inferior capsule. There TICU are surface however appears to be maintained. Ligaments Suboptimally visualized Muscles and Tendons There is diffuse edema seen within the muscles surrounding the shoulder. Within the subcoracoid subdeltoid bursa there is multilocular complex fluid collection with scattered debris that appears to encompass the deltoid musculature and extend posteriorly. There is significant increased signal seen throughout the deltoid musculature. Soft tissue Subcutaneous edema is seen surrounding the shoulder IMPRESSION: Findings which could be suggestive of osteomyelitis versus reactive marrow seen through the lateral corner of the humeral head. No osseous fracture. Multilocular complex collection seen within the subdeltoid recess extending around the deltoid musculature posteriorly which could be due to infectious/inflammatory bursitis Diffuse muscular edema/myositis surrounding the shoulder. Electronically Signed   By: Jonna Clark M.D.   On: 12/04/2020 17:20    Review of Systems  Constitutional: Positive for chills and fever.  HENT: Positive for dental problem.   Eyes: Negative.   Respiratory: Negative.   Cardiovascular: Negative.   Gastrointestinal: Negative.   Musculoskeletal: Positive for arthralgias.       Right shoulder pain  Skin: Negative.   Neurological: Negative.   Psychiatric/Behavioral: The patient is nervous/anxious.    Blood pressure (!) 152/76, pulse (!) 108, temperature 99.3 F (37.4 C), temperature source Oral, resp. rate 16, height 5\' 7"  (1.702 m), weight 75.2 kg, last menstrual period 11/10/2020, SpO2 93 %. Physical Exam Vitals reviewed.  HENT:     Head: Normocephalic.     Mouth/Throat:     Mouth: Mucous membranes are dry.  Eyes:     Extraocular Movements: Extraocular movements intact.   Cardiovascular:     Rate and Rhythm: Tachycardia present.     Pulses: Normal pulses.  Pulmonary:     Effort: Pulmonary effort is normal.  Abdominal:     General: Abdomen is flat.  Musculoskeletal:     Cervical back: Neck supple.     Comments: Right shoulder demonstrates no obvious erythema or swelling about the shoulder girdle.  She has mild tenderness to palpation along the posterior aspect of the deltoid.  She tolerates passive shoulder range of motion but with some discomfort.  Actively she can forward elevate to 110 degrees.  She can abduct to 90 degrees albeit with hesitation.  Distally no pain about the elbow or wrist.  She has full digital motion.  Normal sensation about the lateral shoulder as well as the hand.  Palpable radial pulse.  Left ankle without evidence of swelling or erythema.  She tolerates passive and active range of motion of the ankle without difficulty.  No tenderness to palpation.  Skin:    General: Skin is warm.  Neurological:  General: No focal deficit present.     Mental Status: She is alert.  Psychiatric:        Mood and Affect: Mood normal.     Assessment/Plan: MRI demonstrated subdeltoid abscess with some reactive marrow edema in the lateral humerus.  This may represent early osteomyelitis but there is no obvious bony destruction.  Mild glenohumeral joint effusion.  We will plan for incision and drainage of her subdeltoid abscess.  We will send cultures.  We will also likely perform glenohumeral joint arthrotomy and wash the joint out as well.  We discussed the risks, benefits and alternatives to surgery which include but not limited to wound healing complications, continued infection, continued pain, need for long-term antibiotics, need for further surgery and damage to surrounding structures such as nerves, blood vessels and soft tissues.    Terance Hart 12/06/2020, 7:07 AM

## 2020-12-06 NOTE — Anesthesia Preprocedure Evaluation (Addendum)
Anesthesia Evaluation  Patient identified by MRN, date of birth, ID band Patient awake    Reviewed: Allergy & Precautions, NPO status , Patient's Chart, lab work & pertinent test results  Airway Mallampati: III  TM Distance: >3 FB Neck ROM: Full    Dental no notable dental hx.    Pulmonary Current SmokerPatient did not abstain from smoking.,    Pulmonary exam normal breath sounds clear to auscultation       Cardiovascular negative cardio ROS Normal cardiovascular exam Rhythm:Regular Rate:Normal     Neuro/Psych negative neurological ROS  negative psych ROS   GI/Hepatic negative GI ROS, (+)     substance abuse  IV drug use,   Endo/Other  negative endocrine ROS  Renal/GU Renal InsufficiencyRenal disease     Musculoskeletal  (+) Arthritis ,   Abdominal   Peds  Hematology  (+) anemia ,   Anesthesia Other Findings RIGHT SHOULDER ABSCESS  Reproductive/Obstetrics hcg negative on admission                            Anesthesia Physical Anesthesia Plan  ASA: III  Anesthesia Plan: General   Post-op Pain Management:    Induction: Intravenous  PONV Risk Score and Plan: 2 and Ondansetron, Dexamethasone, Midazolam and Treatment may vary due to age or medical condition  Airway Management Planned: LMA  Additional Equipment:   Intra-op Plan:   Post-operative Plan: Extubation in OR  Informed Consent: I have reviewed the patients History and Physical, chart, labs and discussed the procedure including the risks, benefits and alternatives for the proposed anesthesia with the patient or authorized representative who has indicated his/her understanding and acceptance.     Dental advisory given  Plan Discussed with: CRNA  Anesthesia Plan Comments:         Anesthesia Quick Evaluation

## 2020-12-06 NOTE — Anesthesia Postprocedure Evaluation (Signed)
Anesthesia Post Note  Patient: Manuela Halbur  Procedure(s) Performed: IRRIGATION AND DEBRIDEMENT Right SHOULDER ABSCESS (Right Shoulder)     Patient location during evaluation: PACU Anesthesia Type: General Level of consciousness: awake Pain management: pain level controlled Vital Signs Assessment: post-procedure vital signs reviewed and stable Respiratory status: spontaneous breathing, nonlabored ventilation, respiratory function stable and patient connected to nasal cannula oxygen Cardiovascular status: blood pressure returned to baseline and stable Postop Assessment: no apparent nausea or vomiting Anesthetic complications: no   No complications documented.  Last Vitals:  Vitals:   12/06/20 1022 12/06/20 1154  BP: 129/75 121/71  Pulse: 98 97  Resp: 20 18  Temp: 36.9 C 36.6 C  SpO2: 98% 98%    Last Pain:  Vitals:   12/06/20 1154  TempSrc: Oral  PainSc:                  Ramaj Frangos P [facility]

## 2020-12-06 NOTE — Op Note (Signed)
Anne Bautista female 35 y.o. 12/06/2020  PreOperative Diagnosis: Right subdeltoid abscess  PostOperative Diagnosis: Same  PROCEDURE: Incision and drainage of right shoulder subdeltoid abscess with multiple cavities  SURGEON: Dub Mikes, MD  ASSISTANT: None  ANESTHESIA: General LMA anesthesia  FINDINGS: Subdeltoid abscess with necrotic fat tissue and purulent material with multiple loculated cavities.  No direct tract to the glenohumeral joint.  IMPLANTS: None  INDICATIONS:34 y.o. female presented to the emergency department and sepsis with blood cultures positive for MRSA and notable vegetations on her echo.  She was complaining of right shoulder pain.  Her exam was not consistent with septic arthritis however aspiration was performed and was positive for MRSA.  It was thought this was a contaminant due to her exam and negative Gram stain.  MRI was subsequently ordered which demonstrated a subdeltoid abscess with reactive bone marrow edema in the proximal humerus.  She had a history of axillary cyst which was decompressed several weeks ago by the patient.  Given the MRI findings of the abscess and continued pain she was indicated for surgery.   Patient understood the risks, benefits and alternatives to surgery which include but are not limited to wound healing complications, infection, nonunion, malunion, need for further surgery as well as damage to surrounding structures. They also understood the potential for continued pain in that there were no guarantees of acceptable outcome After weighing these risks the patient opted to proceed with surgery.  PROCEDURE: Patient was identified in the preoperative holding area.  Right shoulder was marked by myself.  Consent was signed by myself and the patient.  She was taken to operative suite and placed supine on the operative table.  LMA anesthesia was induced without difficulty.  Then using the beanbag a sloppy lateral position was  obtained.  All bony prominences were well-padded.  The right shoulder area was prepped and draped in usual sterile fashion.  Preoperative antibiotics were given.  Surgical timeout was performed.  I began by making a 4 to 5 cm incision on the posterior border of the deltoid at the site of the abscess.  This taken sharply down through skin and subcutaneous tissue.  Blunt dissection was used to mobilize skin flaps and Bovie cautery was used for skin bleeders.  Then the interval between the posterior aspect of the deltoid was identified.  The fascia in this area was incised in line with the deltoid musculature.  After this there was a small rush of purulent type material.  Using blunt dissection the abscess cavity was opened up and culture material was sent.  Then blunt dissection was used to gain access to the subdeltoid recess.  This had a large amount of necrotic fatty tissue and purulent appearing material.  This was decompressed and the material was discarded.  Then 3000 cc of normal saline was irrigated through and around the abscess cavity.  Care was taken to deloculated all the abscess cavities and there were multiple that extended down into the posterior and lateral distal arm as well as the lateral aspect of the subdeltoid recess.  Attempt was made to identify any tracking to the glenohumeral joint and there was none identified.  Irrigation was performed.  The tissue appeared viable with the exception the necrotic fat which was discarded.  Any nonviable fascial tissue and muscular tissue was removed with dissection scissors.  Then 1000 mg of Vanco powder was placed within the abscess cavity.  A 19 French JP drain was placed through a stab incision distal  to the incision in the posterior arm region.  The wound was then closed in a layered fashion including the fascia, subcuticular tissue and skin using 3-0 Monocryl and 2-0 nylon suture.  The drain was sewn.  A soft dressing was placed.  POST OPERATIVE  INSTRUCTIONS: Monitor drain output and remove drain when less than 30 cc per shift Continue antibiotics per medicine team and infectious disease Follow-up cultures Range of motion and weightbearing as tolerated right shoulder  TOURNIQUET TIME: No tourniquet was used  BLOOD LOSS:  less than 50 mL         DRAINS: 19 [place] JP drain         SPECIMEN: none       COMPLICATIONS:  * No complications entered in OR log *         Disposition: PACU - hemodynamically stable.         Condition: stable

## 2020-12-06 NOTE — Progress Notes (Signed)
PROGRESS NOTE    Anne Bautista  ZOX:[id number] DOB: 12/21/1985 DOA: 11/15/2020 PCP: Patient, No Pcp Per   Chief Complaint  Patient presents with  . 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  She was seen by infectious disease and CT surgery and orthopedics See her once stabilized and transferred to [facility] service 2/22 She remains on IV antibiotics and planned for x6 weeks MRI rt shoulder 3/3 3/5-status post incision and drainage of right shoulder subdeltoid abscess with multiple cavities  Subjective:  Patient underwent I&D of right shoulder Afebrile overnight mildly tachycardic.  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 Status post incision and drainage of right shoulder subdeltoid abscess with multiple cavities 3/5 by Dr Susa Simmonds. Monitor temperature curve CBC.  Continue on  IV daptomycin (pharmacy dosing)  with weekly CK on Sunday, has PICC line since 2/28. Last blood culture negative on 2/18.Cardiac surgery following for possible angiovac debridement once sepsis has cleared.  IV drug abuse/cocaine use marijuana use tobacco dependence UDS positive for cocaine, hep C negative HIV negative RPR negative, cessation was encouraged.  No signs of withdrawal.  Acute hypoxic respiratory failure/narcotic withdrawal with bilateral embolic pneumonia:  She has been on room air but needing oxygen postop  Today  Acute blood loss anemia/iron deficiency anemia status post PRBC transfusion 2/22 and 2/23.  Monitor and transfuse if less than 7 g.  Holding in 7 to 8 g range.  Check CBC in a.m. Recent Labs  Lab 11/30/20 0637 12/01/20 0026 12/02/20 0150 12/03/20 0440  HGB 8.3* 8.1* 7.1* 7.4*  HCT 25.4* 26.4* 22.6* 22.7*   Acute cystitis without hematuria culture positive for MRSA, was treated with antibiotics.  Nonoliguric AKI with ATN, creatinine peaked 2.8.improved to 1.5.  BMP in a.m. Recent Labs  Lab 11/30/20 0637 12/01/20 0026 12/02/20 0150 12/03/20 0440  BUN 37* 35* 35* 31*  CREATININE 1.77* 1.55* 1.64* 1.51*   Hypertension: Well-controlled on amlodipine 10 mg and clonidine 0.1 mg twice daily   GERD on PPI  Chronic constipation, continue laxatives x-ray 2/28 - for ileus or obstruction  Debility/deconditioning mobilize.  Moderate protein calorie malnutrition see below.  FEN: Cortrak got  dislodged, continue speech and dietitian on board she seems to be tolerating p.o.  Plan of care discussed with the patient extensively, she understands the gravity of her illness and the cause behind it - we talked about need to stay in the hospital and complete IV antibiotics she understands if she does not finish the course she is at risk of worsening sepsis septic shock which could lead into multiorgan failure and death. I have educated and counseled her not to leave AGAINST MEDICAL ADVICE at any point before completing treatment. Monitor closely  Diet Order            Diet regular Room service appropriate? Yes; Fluid consistency: Thin  Diet effective now  Nutrition Problem: Moderate Malnutrition Etiology: social / environmental circumstances (IVDU) Signs/Symptoms: mild fat depletion,moderate fat depletion,mild muscle depletion,moderate muscle depletion Interventions: Ensure Enlive (each supplement provides 350kcal and  20 grams of protein),MVI Patient's Body mass index is 25.97 kg/m.   DVT prophylaxis: heparin injection 5,000 Units Start: 11/20/20 1400 SCDs Start: 11/16/20 1257 Code Status:   Code Status: Full Code Family Communication: plan of care discussed with patient at bedside. Lived w boyfired and self updating  Status is: Inpatient Remains inpatient appropriate because:Unsafe d/c plan, IV treatments appropriate due to intensity of illness or inability to take PO and Inpatient level of care appropriate due to severity of illness  Dispo: The patient is from: Home              Anticipated d/c is to: TBD              Patient currently is not medically stable to d/c.   Difficult to place patient Yes   Unresulted Labs (From admission, onward)          Start     Ordered   12/06/20 0846  Aerobic/Anaerobic Culture w Gram Stain (surgical/deep wound)  RELEASE UPON ORDERING,   TIMED       Comments: Specimen A: Pre-op diagnosis: RIGHT SHOULDER ABSCESS    12/06/20 0846   11/30/20 0500  CK  Weekly,   R     Question:  Specimen collection method  Answer:  Lab=Lab collect   11/29/20 1242          Medications reviewed: Scheduled Meds: . amLODipine  10 mg Oral Daily  . chlorhexidine  15 mL Mouth Rinse BID  . Chlorhexidine Gluconate Cloth  6 each Topical Daily  . cloNIDine  0.1 mg Oral BID  . dicyclomine  20 mg Oral TID AC & HS  . docusate sodium  100 mg Oral BID  . feeding supplement  237 mL Oral QID  . folic acid  1 mg Oral Daily  . heparin  5,000 Units Subcutaneous Q8H  . HYDROmorphone      . ketorolac      . mouth rinse  15 mL Mouth Rinse q12n4p  . methadone  10 mg Oral Q8H  . multivitamin with minerals  1 tablet Oral Daily  . pantoprazole  40 mg Oral Daily  . polyethylene glycol  17 g Oral BID  . QUEtiapine  25 mg Oral BID  . senna  2 tablet Oral Daily  . sodium chloride flush  10-40 mL Intracatheter Q12H  . thiamine  100 mg Oral Daily   Continuous Infusions: . sodium chloride  Stopped (12/05/20 0656)  . acetaminophen    . DAPTOmycin (CUBICIN)  IV 764 mg (12/05/20 2130)  . lactated ringers 10 mL/hr at 12/06/20 0730    Consultants:see note  Procedures:see note  Antimicrobials: Anti-infectives (From admission, onward)   Start     Dose/Rate Route Frequency Ordered Stop   12/06/20 0833  vancomycin (VANCOCIN) powder  Status:  Discontinued          As needed 12/06/20 0834 12/06/20 0852   12/06/20 0600  ceFAZolin (ANCEF) IVPB 2g/100 mL premix        2 g 200 mL/hr over 30 Minutes Intravenous On call to O.R. 12/06/20 0306 12/06/20 0755   11/28/20 2000  DAPTOmycin (CUBICIN) 764 mg in sodium chloride 0.9 % IVPB        10 mg/kg  76.4 kg 230.6 mL/hr over 30 Minutes Intravenous Daily 11/28/20 0727  11/24/20 2000  DAPTOmycin (CUBICIN) 827 mg in sodium chloride 0.9 % IVPB  Status:  Discontinued        10 mg/kg  82.7 kg 233.1 mL/hr over 30 Minutes Intravenous Daily 11/24/20 0821 11/28/20 0727   11/19/20 1000  DAPTOmycin (CUBICIN) 770 mg in sodium chloride 0.9 % IVPB  Status:  Discontinued        10 mg/kg  77 kg 230.8 mL/hr over 30 Minutes Intravenous Daily 11/19/20 0908 11/24/20 0821   11/19/20 1000  doxycycline (VIBRA-TABS) tablet 100 mg        100 mg Oral Every 12 hours 11/19/20 0908 11/23/20 2123   11/19/20 0439  vancomycin variable dose per unstable renal function (pharmacist dosing)  Status:  Discontinued         Does not apply See admin instructions 11/19/20 0439 11/19/20 0853   11/16/20 2000  vancomycin (VANCOREADY) IVPB 1250 mg/250 mL  Status:  Discontinued        1,250 mg 166.7 mL/hr over 90 Minutes Intravenous Every 24 hours 11/15/20 1933 11/16/20 1108   11/16/20 1200  vancomycin (VANCOCIN) IVPB 1000 mg/200 mL premix  Status:  Discontinued        1,000 mg 200 mL/hr over 60 Minutes Intravenous Every 12 hours 11/16/20 1108 11/19/20 0439   11/16/20 0745  metroNIDAZOLE (FLAGYL) IVPB 500 mg        500 mg 100 mL/hr over 60 Minutes Intravenous  Once 11/16/20  0741 11/16/20 0907   11/15/20 2237  vancomycin (VANCOCIN) 500 MG powder       Note to Pharmacy: Ihor Dow   : cabinet override      11/15/20 2237 11/16/20 1044   11/15/20 2000  ceFEPIme (MAXIPIME) 2 g in sodium chloride 0.9 % 100 mL IVPB  Status:  Discontinued        2 g 200 mL/hr over 30 Minutes Intravenous Every 12 hours 11/15/20 1933 11/16/20 1212   11/15/20 1945  vancomycin (VANCOREADY) IVPB 1500 mg/300 mL  Status:  Discontinued        1,500 mg 150 mL/hr over 120 Minutes Intravenous  Once 11/15/20 1933 11/15/20 1939   11/15/20 1945  vancomycin (VANCOCIN) IVPB 1000 mg/200 mL premix       "And" Linked Group Details   1,000 mg 200 mL/hr over 60 Minutes Intravenous  Once 11/15/20 1939 11/15/20 2231   11/15/20 1945  vancomycin (VANCOREADY) IVPB 500 mg/100 mL       "And" Linked Group Details   500 mg 100 mL/hr over 60 Minutes Intravenous  Once 11/15/20 1939 11/16/20 0002     Culture/Microbiology    Component Value Date/Time   SDES BLOOD RIGHT HAND 11/21/2020 0526   SDES BLOOD LEFT HAND 11/21/2020 0526   SPECREQUEST  11/21/2020 0526    BOTTLES DRAWN AEROBIC ONLY Blood Culture adequate volume   SPECREQUEST  11/21/2020 0526    BOTTLES DRAWN AEROBIC ONLY Blood Culture adequate volume   CULT  11/21/2020 0526    NO GROWTH 5 DAYS Performed at [facility], [address], [place], [place] [address]    CULT  11/21/2020 0526    NO GROWTH 5 DAYS Performed at [facility], [address], [place], [place] [address]    REPTSTATUS 11/26/2020 FINAL 11/21/2020 0526   REPTSTATUS 11/26/2020 FINAL 11/21/2020 0526    Other culture-see note  Objective: Vitals: Today's Vitals   12/06/20 0941 12/06/20 0956 12/06/20 1022 12/06/20 1154  BP: 131/72 116/72 129/75 121/71  Pulse: Marland Kitchen)  101 (!) 106 98 97  Resp: 20 20 20 18   Temp:   98.5 F (36.9 C) 97.9 F (36.6 C)  TempSrc:   Oral Oral  SpO2: 98% 95% 98% 98%  Weight:      Height:      PainSc: 6  5       Intake/Output  Summary (Last 24 hours) at 12/06/2020 1200 Last data filed at 12/06/2020 1000 Gross per 24 hour  Intake 772 ml  Output 2395 ml  Net -1623 ml   Filed Weights   11/30/20 0500 12/01/20 0328 12/02/20 0300  Weight: 77.6 kg 74 kg 75.2 kg   Weight change:   Intake/Output from previous day: 03/04 0701 - 03/05 0700 In: 472 [P.O.:472] Out: 2350 [Urine:2350] Intake/Output this shift: Total I/O In: 300 [I.V.:300] Out: 45 [Drains:25; Blood:20] Filed Weights   11/30/20 0500 12/01/20 0328 12/02/20 0300  Weight: 77.6 kg 74 kg 75.2 kg    Examination:  General exam: AAO (,NAD, weak appearing. HEENT:Oral mucosa moist, Ear/Nose WNL grossly, dentition normal. Respiratory system: bilaterally diminishedd,no wheezing or crackles,no use of accessory muscle Cardiovascular system: S1 & S2 +, No JVD,. Gastrointestinal system: Abdomen soft, NT,ND, BS+ Nervous System:Alert, awake, moving extremities and grossly nonfocal Extremities: Right shoulder in dressing, no edema, distal peripheral pulses palpable.  Skin: No rashes,no icterus. MSK: Normal muscle bulk,tone, power  Data Reviewed: I have personally reviewed following labs and imaging studies CBC: Recent Labs  Lab 11/30/20 0637 12/01/20 0026 12/02/20 0150 12/03/20 0440  WBC 10.2 9.2 8.3 12.1*  HGB 8.3* 8.1* 7.1* 7.4*  HCT 25.4* 26.4* 22.6* 22.7*  MCV 83.6 84.3 84.0 82.5  PLT 218 215 204 283   Basic Metabolic Panel: Recent Labs  Lab 11/30/20 0637 12/01/20 0026 12/02/20 0150 12/03/20 0440  NA 138 137 136 136  K 4.3 4.3 4.2 4.4  CL 102 103 102 103  CO2 26 24 25 24   GLUCOSE 120* 122* 120* 106*  BUN 37* 35* 35* 31*  CREATININE 1.77* 1.55* 1.64* 1.51*  CALCIUM 8.4* 8.1* 8.1* 8.1*  MG  --   --   --  2.2   GFR: Estimated Creatinine Clearance: 55 mL/min (A) (by C-G formula based on SCr of 1.51 mg/dL (H)). Liver Function Tests: Recent Labs  Lab 12/02/20 0150  AST 41  ALT 29  ALKPHOS 98  BILITOT 0.6  PROT 7.8  ALBUMIN 1.6*   No  results for input(s): LIPASE, AMYLASE in the last 168 hours. No results for input(s): AMMONIA in the last 168 hours. Coagulation Profile: No results for input(s): INR, PROTIME in the last 168 hours. Cardiac Enzymes: Recent Labs  Lab 11/30/20 0637  CKTOTAL 23*   BNP (last 3 results) No results for input(s): PROBNP in the last 8760 hours. HbA1C: No results for input(s): HGBA1C in the last 72 hours. CBG: Recent Labs  Lab 12/05/20 1251 12/05/20 2037 12/06/20 0028 12/06/20 0457 12/06/20 1151  GLUCAP 88 93 83 84 143*   Lipid Profile: No results for input(s): CHOL, HDL, LDLCALC, TRIG, CHOLHDL, LDLDIRECT in the last 72 hours. Thyroid Function Tests: No results for input(s): TSH, T4TOTAL, FREET4, T3FREE, THYROIDAB in the last 72 hours. Anemia Panel: No results for input(s): VITAMINB12, FOLATE, FERRITIN, TIBC, IRON, RETICCTPCT in the last 72 hours. Sepsis Labs: No results for input(s): PROCALCITON, LATICACIDVEN in the last 168 hours.  No results found for this or any previous visit (from the past 240 hour(s)).   Radiology Studies: MR SHOULDER RIGHT WO CONTRAST  Result  Date: 12/04/2020 CLINICAL DATA:  Shoulder pain EXAM: MRI OF THE RIGHT SHOULDER WITHOUT CONTRAST TECHNIQUE: Multiplanar, multisequence MR imaging of the shoulder was performed. No intravenous contrast was administered. COMPARISON:  None. FINDINGS: Bones/Joint/Cartilage There is heterogeneous T2 hyperintense signal with T1 hypointensity seen at the lateral corner of the humeral head. No osseous fracture however seen. There is a small glenohumeral joint effusion with edema around the inferior capsule. There TICU are surface however appears to be maintained. Ligaments Suboptimally visualized Muscles and Tendons There is diffuse edema seen within the muscles surrounding the shoulder. Within the subcoracoid subdeltoid bursa there is multilocular complex fluid collection with scattered debris that appears to encompass the deltoid  musculature and extend posteriorly. There is significant increased signal seen throughout the deltoid musculature. Soft tissue Subcutaneous edema is seen surrounding the shoulder IMPRESSION: Findings which could be suggestive of osteomyelitis versus reactive marrow seen through the lateral corner of the humeral head. No osseous fracture. Multilocular complex collection seen within the subdeltoid recess extending around the deltoid musculature posteriorly which could be due to infectious/inflammatory bursitis Diffuse muscular edema/myositis surrounding the shoulder. Electronically Signed   By: Jonna Clark M.D.   On: 12/04/2020 17:20     LOS: 20 days   Lanae Boast, MD [facility]  12/06/2020, 12:00 PM

## 2020-12-06 NOTE — Anesthesia Procedure Notes (Signed)
Procedure Name: LMA Insertion Date/Time: 12/06/2020 7:53 AM Performed by: Carlos American, CRNA Pre-anesthesia Checklist: Patient identified, Emergency Drugs available, Suction available and Patient being monitored Patient Re-evaluated:Patient Re-evaluated prior to induction Oxygen Delivery Method: Circle System Utilized Preoxygenation: Pre-oxygenation with 100% oxygen Induction Type: IV induction Ventilation: Mask ventilation without difficulty LMA: LMA inserted LMA Size: 4.0 Number of attempts: 1 Airway Equipment and Method: Bite block Placement Confirmation: positive ETCO2 Tube secured with: Tape Dental Injury: Teeth and Oropharynx as per pre-operative assessment

## 2020-12-06 NOTE — Transfer of Care (Signed)
Immediate Anesthesia Transfer of Care Note  Patient: Anne Bautista  Procedure(s) Performed: IRRIGATION AND DEBRIDEMENT Right SHOULDER ABSCESS (Right Shoulder)  Patient Location: PACU  Anesthesia Type:General  Level of Consciousness: drowsy and patient cooperative  Airway & Oxygen Therapy: Patient Spontanous Breathing and Patient connected to face mask oxygen  Post-op Assessment: Report given to RN and Post -op Vital signs reviewed and stable  Post vital signs: Reviewed and stable  Last Vitals:  Vitals Value Taken Time  BP 113/65 12/06/20 0857  Temp 36.5 C 12/06/20 0856  Pulse 95 12/06/20 0859  Resp 20 12/06/20 0859  SpO2 98 % 12/06/20 0859  Vitals shown include unvalidated device data.  Last Pain:  Vitals:   12/06/20 0501  TempSrc: Oral  PainSc: 6       Patients Stated Pain Goal: 1 (12/05/20 2034)  Complications: No complications documented.

## 2020-12-06 NOTE — Progress Notes (Signed)
Report given to short stay RN, patient left the unit for surgery.

## 2020-12-07 LAB — CBC
HCT: 19.8 % — ABNORMAL LOW (ref 36.0–46.0)
Hemoglobin: 6 g/dL — CL (ref 12.0–15.0)
MCH: 25.8 pg — ABNORMAL LOW (ref 26.0–34.0)
MCHC: 30.3 g/dL (ref 30.0–36.0)
MCV: 85 fL (ref 80.0–100.0)
Platelets: 288 10*3/uL (ref 150–400)
RBC: 2.33 MIL/uL — ABNORMAL LOW (ref 3.87–5.11)
RDW: 18.4 % — ABNORMAL HIGH (ref 11.5–15.5)
WBC: 8.6 10*3/uL (ref 4.0–10.5)
nRBC: 0 % (ref 0.0–0.2)

## 2020-12-07 LAB — GLUCOSE, CAPILLARY
Glucose-Capillary: 104 mg/dL — ABNORMAL HIGH (ref 70–99)
Glucose-Capillary: 112 mg/dL — ABNORMAL HIGH (ref 70–99)
Glucose-Capillary: 88 mg/dL (ref 70–99)
Glucose-Capillary: 92 mg/dL (ref 70–99)
Glucose-Capillary: 98 mg/dL (ref 70–99)

## 2020-12-07 LAB — HEMOGLOBIN AND HEMATOCRIT, BLOOD
HCT: 22.6 % — ABNORMAL LOW (ref 36.0–46.0)
Hemoglobin: 7.1 g/dL — ABNORMAL LOW (ref 12.0–15.0)

## 2020-12-07 LAB — COMPREHENSIVE METABOLIC PANEL
ALT: 19 U/L (ref 0–44)
AST: 20 U/L (ref 15–41)
Albumin: 1.6 g/dL — ABNORMAL LOW (ref 3.5–5.0)
Alkaline Phosphatase: 65 U/L (ref 38–126)
Anion gap: 10 (ref 5–15)
BUN: 33 mg/dL — ABNORMAL HIGH (ref 6–20)
CO2: 23 mmol/L (ref 22–32)
Calcium: 8.1 mg/dL — ABNORMAL LOW (ref 8.9–10.3)
Chloride: 103 mmol/L (ref 98–111)
Creatinine, Ser: 1.96 mg/dL — ABNORMAL HIGH (ref 0.44–1.00)
GFR, Estimated: 34 mL/min — ABNORMAL LOW (ref >60)
Glucose, Bld: 107 mg/dL — ABNORMAL HIGH (ref 70–99)
Potassium: 4 mmol/L (ref 3.5–5.1)
Sodium: 136 mmol/L (ref 135–145)
Total Bilirubin: 0.5 mg/dL (ref 0.3–1.2)
Total Protein: 8.1 g/dL (ref 6.5–8.1)

## 2020-12-07 LAB — PREPARE RBC (CROSSMATCH)

## 2020-12-07 LAB — CK: Total CK: 47 U/L (ref 38–234)

## 2020-12-07 MED ORDER — SODIUM CHLORIDE 0.9% IV SOLUTION: Freq: Once | INTRAVENOUS | Status: AC

## 2020-12-07 NOTE — Progress Notes (Signed)
RN notified of Critical Hemoglobin 6.0 at 0615, MD on call notified. N.O. placed for blood typing.

## 2020-12-07 NOTE — Progress Notes (Signed)
Pharmacy Antibiotic Note  Anne Bautista is a 35 y.o. female admitted on 11/15/2020 with Sepsis 2/2 MRSA IE with septic emboli to both lungs, kidneys, R shoulder and possibly L ankle.,.  Pharmacy has been consulted for Daptomycin dosing.  ID: Sepsis 2/2 MRSA IE with septic emboli to both lungs, kidneys, R shoulder and possibly L ankle., CRP 19, ESR 112 -CT AP septic emboli,  LA 3.1>0.8, CK 47, CRP elevated, PC wNL -2/14 ECHO with 2x1 cm vegetation, trivial regurg, f/u TEE -R shoulder synovial fluid WBCs 8250 - Afebrile. WBC trending down to 8.6. Scr to 1.96  Cefepime 2/12 >> 2/13 Vancomycin 2/12 >> 2/16 Dapto 2/16>> Doxy 2/16>>2/20  2/17: CK 280 2/20: CK 45 2/27: CK 23  2/12 Bcx: 3/3 MRSA  2/12 COVID PCR: neg 2/12 UCx: > 100K MRSA 2/14 Bcx: 1/4 MRSA 2/14 R shoulder fluid: MRSA 2/18 Bcx negative 3/5 Wcx: sent  Plan: -Dapto for MRSA TV IE 68m/kg/24h -CK weekly on Sunday  Height: '5\' 7"'  (170.2 cm) Weight:  (Pt refused) IBW/kg (Calculated) : 61.6  Temp (24hrs), Avg:98.1 F (36.7 C), Min:97.7 F (36.5 C), Max:98.5 F (36.9 C)  Recent Labs  Lab 12/01/20 0026 12/02/20 0150 12/03/20 0440 12/07/20 0500  WBC 9.2 8.3 12.1* 8.6  CREATININE 1.55* 1.64* 1.51* 1.96*    Estimated Creatinine Clearance: 42.4 mL/min (A) (by C-G formula based on SCr of 1.96 mg/dL (H)).    No Known Allergies  MDimple Nanas PharmD PGY-1 Acute Care Pharmacy Resident Office: [phone]/03/2021 7:07 AM

## 2020-12-07 NOTE — Progress Notes (Signed)
PROGRESS NOTE  Anne Bautista VZC:[id number] DOB: Jun 09, 1986 DOA: 11/15/2020 PCP: Patient, No Pcp Per   LOS: 21 days   Brief Narrative / Interim history: 35 year old female with history of IV drug use, tobacco use, depression came to the hospital with myalgias and cough, he was septic on arrival admitted to the ICU.  She was found to have MRSA bacteremia, tricuspid valve endocarditis as well as septic emboli to lungs, kidneys.  Infectious disease and cardiothoracic surgery consulted.  Hospital course complicated by worsening right shoulder pain found to have septic arthritis.   Subjective / 24h Interval events: Tells me that she is in pain this morning, underwent shoulder surgery yesterday and has a trach in place.  Also reports cough and intermittent shortness of breath with activities  Assessment & Plan: Principal Problem Severe sepsis due to MRSA bacteremia -Complicated by septic emboli to both lungs and kidneys, right shoulder possibly septic left ankle (MRI refused by patient).  She also has a right subdeltoid abscess. -ID, cardiothoracic surgery, orthopedic surgery following -Status post incision and drainage of right shoulder subdeltoid abscess with multiple cavities on 3/5 by Dr. Susa Simmonds -Continue daptomycin, monitor CK -Last blood cultures negative on 2/18, has a PICC line since 2/28. -Cardiothoracic surgery following for possible angiovac debridement once sepsis has cleared  Active Problems IV drug use, cocaine use, marijuana, tobacco -cessation was encouraged.  UDS was positive for cocaine.  Hep C and HIV are negative.  RPR is negative.  Acute hypoxic respiratory failure, narcotic withdrawal, bilateral pulmonary septic emboli -hypoxia resolved, she is on room air this morning  Acute blood loss anemia, iron deficiency anemia -improving 6.0 this morning, transfuse unit of packed red blood cells  Acute cystitis without hematuria -MRSA in cultures, she is on antibiotics  Nonoliguric  AKI with ATN -creatinine peaked at 2.8, but this lowers 1.5 but back up to 1.9 this morning.  Monitor  Hypertension-continue current regimen  Debility/deconditioning-mobilize  Moderate protein calorie malnutrition   Scheduled Meds: . amLODipine  10 mg Oral Daily  . chlorhexidine  15 mL Mouth Rinse BID  . Chlorhexidine Gluconate Cloth  6 each Topical Daily  . cloNIDine  0.1 mg Oral BID  . dicyclomine  20 mg Oral TID AC & HS  . docusate sodium  100 mg Oral BID  . feeding supplement  237 mL Oral QID  . folic acid  1 mg Oral Daily  . heparin  5,000 Units Subcutaneous Q8H  . mouth rinse  15 mL Mouth Rinse q12n4p  . methadone  10 mg Oral Q8H  . multivitamin with minerals  1 tablet Oral Daily  . pantoprazole  40 mg Oral Daily  . polyethylene glycol  17 g Oral BID  . QUEtiapine  25 mg Oral BID  . senna  2 tablet Oral Daily  . sodium chloride flush  10-40 mL Intracatheter Q12H  . thiamine  100 mg Oral Daily   Continuous Infusions: . sodium chloride Stopped (12/05/20 0656)  . DAPTOmycin (CUBICIN)  IV 764 mg (12/06/20 2036)  . lactated ringers 10 mL/hr at 12/06/20 0730   PRN Meds:.sodium chloride, acetaminophen **OR** acetaminophen, bisacodyl, guaiFENesin, HYDROmorphone (DILAUDID) injection, levalbuterol, LORazepam, metoprolol tartrate, nicotine, oxyCODONE, sodium chloride flush  Diet Orders (From admission, onward)    Start     Ordered   12/06/20 1042  Diet regular Room service appropriate? Yes; Fluid consistency: Thin  Diet effective now       Question Answer Comment  Room service appropriate? Yes  Fluid consistency: Thin      12/06/20 1041          DVT prophylaxis: heparin injection 5,000 Units Start: 11/20/20 1400 SCDs Start: 11/16/20 1257     Code Status: Full Code  Family Communication: no family at bedside   Status is: Inpatient  Remains inpatient appropriate because:Inpatient level of care appropriate due to severity of illness   Dispo: The patient is from:  Home              Anticipated d/c is to: Home              Patient currently is not medically stable to d/c.   Difficult to place patient Yes  Level of care: Progressive  Consultants:  Infectious disease Cardiothoracic surgery Orthopedic surgery  Procedures:  2D echo:  1. Left ventricular ejection fraction, by estimation, is 55 to 60%. The left ventricle has normal function. The left ventricle has no regional wall motion abnormalities. Left ventricular diastolic parameters are consistent with Grade I diastolic dysfunction (impaired relaxation).  2. Right ventricular systolic function is normal. The right ventricular size is normal. There is normal pulmonary artery systolic pressure. The estimated right ventricular systolic pressure is 22.4 mmHg.  3. The mitral valve is normal in structure. No evidence of mitral valve regurgitation. No evidence of mitral stenosis.  4. 2 x 1 cm mobile vegetation attached to the tricuspid valve.  5. The aortic valve is tricuspid. Aortic valve regurgitation is not visualized. No aortic stenosis is present.  6. The inferior vena cava is normal in size with greater than 50% respiratory variability, suggesting right atrial pressure of 3 mmHg.   Microbiology  MRSA blood cultures   Antimicrobials: Daptomycin     Objective: Vitals:   12/06/20 1920 12/07/20 0042 12/07/20 0458 12/07/20 0741  BP: (!) 155/96 124/68 (!) 113/53 116/67  Pulse: 96 88 95 99  Resp: 18 15 18 16   Temp: 97.8 F (36.6 C) 98.3 F (36.8 C) 98.2 F (36.8 C) 98.4 F (36.9 C)  TempSrc: Oral Oral Oral Oral  SpO2: 100% 97% 94% 94%  Weight:      Height:        Intake/Output Summary (Last 24 hours) at 12/07/2020 0957 Last data filed at 12/06/2020 2036 Gross per 24 hour  Intake 240 ml  Output 75 ml  Net 165 ml   Filed Weights   11/30/20 0500 12/01/20 0328 12/02/20 0300  Weight: 77.6 kg 74 kg 75.2 kg    Examination:  Constitutional: NAD Eyes: no scleral icterus ENMT:  Mucous membranes are moist.  Neck: normal, supple Respiratory: clear to auscultation bilaterally, no wheezing, no crackles. Normal respiratory effort.  Cardiovascular: Regular rate and rhythm. No LE edema.  Abdomen: non distended, no tenderness. Bowel sounds positive.  Musculoskeletal: no clubbing / cyanosis.  Dressing on right shoulder CDI, drain in place with serosanguineous drainage Skin: no rashes Neurologic: CN 2-12 grossly intact. Strength 5/5 in all 4.  Psychiatric: Normal judgment and insight. Alert and oriented x 3. Normal mood.    Data Reviewed: I have independently reviewed following labs and imaging studies   CBC: Recent Labs  Lab 12/01/20 0026 12/02/20 0150 12/03/20 0440 12/07/20 0500  WBC 9.2 8.3 12.1* 8.6  HGB 8.1* 7.1* 7.4* 6.0*  HCT 26.4* 22.6* 22.7* 19.8*  MCV 84.3 84.0 82.5 85.0  PLT 215 204 283 288   Basic Metabolic Panel: Recent Labs  Lab 12/01/20 0026 12/02/20 0150 12/03/20 0440 12/07/20 0500  NA 137  136 136 136  K 4.3 4.2 4.4 4.0  CL 103 102 103 103  CO2 24 25 24 23   GLUCOSE 122* 120* 106* 107*  BUN 35* 35* 31* 33*  CREATININE 1.55* 1.64* 1.51* 1.96*  CALCIUM 8.1* 8.1* 8.1* 8.1*  MG  --   --  2.2  --    Liver Function Tests: Recent Labs  Lab 12/02/20 0150 12/07/20 0500  AST 41 20  ALT 29 19  ALKPHOS 98 65  BILITOT 0.6 0.5  PROT 7.8 8.1  ALBUMIN 1.6* 1.6*   Coagulation Profile: No results for input(s): INR, PROTIME in the last 168 hours. HbA1C: No results for input(s): HGBA1C in the last 72 hours. CBG: Recent Labs  Lab 12/06/20 0457 12/06/20 1151 12/06/20 1802 12/07/20 0039 12/07/20 0627  GLUCAP 84 143* 134* 112* 104*    Recent Results (from the past 240 hour(s))  Aerobic/Anaerobic Culture w Gram Stain (surgical/deep wound)     Status: None (Preliminary result)   Collection Time: 12/06/20  8:39 AM   Specimen: PATH Other; Body Fluid  Result Value Ref Range Status   Specimen Description ABSCESS RIGHT SHOULDER ABSCESS SPEC  A  Final   Special Requests RIGHT SHOULDER ABSCESS  Final   Gram Stain   Final    FEW WBC PRESENT, PREDOMINANTLY PMN RARE YEAST Performed at [facility], [address] [place], [place]    Culture PENDING  Incomplete   Report Status PENDING  Incomplete     Radiology Studies: No results found.  [id number], MD, PhD [facility]  Between 7 am - 7 pm I am available, please contact me via Amion or Securechat  Between 7 pm - 7 am I am not available, please contact night coverage MD/APP via Amion

## 2020-12-08 ENCOUNTER — Encounter (HOSPITAL_COMMUNITY): Payer: Self-pay | Admitting: Orthopaedic Surgery

## 2020-12-08 DIAGNOSIS — B49 Unspecified mycosis: Secondary | ICD-10-CM

## 2020-12-08 LAB — COMPREHENSIVE METABOLIC PANEL
ALT: 16 U/L (ref 0–44)
AST: 20 U/L (ref 15–41)
Albumin: 1.8 g/dL — ABNORMAL LOW (ref 3.5–5.0)
Alkaline Phosphatase: 72 U/L (ref 38–126)
Anion gap: 10 (ref 5–15)
BUN: 22 mg/dL — ABNORMAL HIGH (ref 6–20)
CO2: 22 mmol/L (ref 22–32)
Calcium: 8.1 mg/dL — ABNORMAL LOW (ref 8.9–10.3)
Chloride: 104 mmol/L (ref 98–111)
Creatinine, Ser: 1.7 mg/dL — ABNORMAL HIGH (ref 0.44–1.00)
GFR, Estimated: 40 mL/min — ABNORMAL LOW (ref >60)
Glucose, Bld: 103 mg/dL — ABNORMAL HIGH (ref 70–99)
Potassium: 3.5 mmol/L (ref 3.5–5.1)
Sodium: 136 mmol/L (ref 135–145)
Total Bilirubin: 0.4 mg/dL (ref 0.3–1.2)
Total Protein: 8 g/dL (ref 6.5–8.1)

## 2020-12-08 LAB — GLUCOSE, CAPILLARY
Glucose-Capillary: 94 mg/dL (ref 70–99)
Glucose-Capillary: 93 mg/dL (ref 70–99)
Glucose-Capillary: 92 mg/dL (ref 70–99)
Glucose-Capillary: 195 mg/dL — ABNORMAL HIGH (ref 70–99)

## 2020-12-08 LAB — PREPARE RBC (CROSSMATCH)

## 2020-12-08 LAB — CBC
HCT: 20.8 % — ABNORMAL LOW (ref 36.0–46.0)
Hemoglobin: 6.6 g/dL — CL (ref 12.0–15.0)
MCH: 26.5 pg (ref 26.0–34.0)
MCHC: 31.7 g/dL (ref 30.0–36.0)
MCV: 83.5 fL (ref 80.0–100.0)
Platelets: 315 10*3/uL (ref 150–400)
RBC: 2.49 MIL/uL — ABNORMAL LOW (ref 3.87–5.11)
RDW: 17.8 % — ABNORMAL HIGH (ref 11.5–15.5)
WBC: 11.2 10*3/uL — ABNORMAL HIGH (ref 4.0–10.5)
nRBC: 0 % (ref 0.0–0.2)

## 2020-12-08 LAB — MAGNESIUM: Magnesium: 1.8 mg/dL (ref 1.7–2.4)

## 2020-12-08 LAB — PHOSPHORUS: Phosphorus: 3.7 mg/dL (ref 2.5–4.6)

## 2020-12-08 MED ORDER — SODIUM CHLORIDE 0.9 % IV SOLN
200.0000 mg | Freq: Once | INTRAVENOUS | Status: AC
  Administered 2020-12-08: 200 mg via INTRAVENOUS
  Filled 2020-12-08: qty 200

## 2020-12-08 MED ORDER — SODIUM CHLORIDE 0.9 % IV SOLN
100.0000 mg | INTRAVENOUS | Status: DC
  Filled 2020-12-08: qty 100

## 2020-12-08 MED ORDER — SODIUM CHLORIDE 0.9% IV SOLUTION: Freq: Once | INTRAVENOUS | Status: AC

## 2020-12-08 NOTE — Progress Notes (Signed)
Physical Therapy Treatment Patient Details Name: Anne Bautista MRN: [id number] DOB: [birth year] Today's Date: 12/08/2020    History of Present Illness 35 yo admitted 2/14 with sepsis due to MRSA bacteremia in setting of IVDU. Rt shoulder pain s/p aspiration 2/14. Underwent I&D R shoulder 3/5. Pt had multiple subdeltoid abscesses. PMHx: IVDU, malnutrition, poor dentition.    PT Comments    Pt fatigued today and scheduled to receive blood transfusion later in AM but willing to mobilize in room. Pt performed seated balance activities, reaching out of BOS in all planes. Pt mildly unsteady in standing requiring min A for 20' ambulation without AD. For shorter distances around bed, pt guarded closely and used foot board for additional support. Pt assisted with changing bed linens that she requested be changed, including replacing pillow cases while sitting. She initially had some difficulty problem solving task but was successful with encouragement. Pt also began to work on getting matted hair brushed out in unsupported sitting. Became too fatigued to finish this task. Pt reported R should pain, did not complain of ankle pain with ambulation today. RN present after session to begin transfusion so pt assisted back to bed. PT will continue to follow.  Keeping rec for SNF at this point but may be able to progress with subsequent sessions.    Follow Up Recommendations  SNF;Supervision for mobility/OOB     Equipment Recommendations  3in1 (PT)    Recommendations for Other Services       Precautions / Restrictions Precautions Precautions: Fall Precaution Comments: right shoulder, left ankle, back pain, watch sats/RR, bil UE edema Restrictions Weight Bearing Restrictions: No    Mobility  Bed Mobility Overal bed mobility: Needs Assistance Bed Mobility: Supine to Sit;Sit to Supine     Supine to sit: Supervision Sit to supine: Supervision   General bed mobility comments: pt able to come to EOB with  increased time as well as return to supine    Transfers Overall transfer level: Needs assistance Equipment used: None Transfers: Sit to/from Stand Sit to Stand: Min guard         General transfer comment: pt mildly unsteady in standing with increased sway, close guarding given but no physical assist to stand  Ambulation/Gait Ambulation/Gait assistance: Min assist;Min guard Gait Distance (Feet): 30 Feet (20', 5', 5') Assistive device: 1 person hand held assist;None Gait Pattern/deviations: Decreased step length - right;Decreased step length - left;Shuffle;Step-through pattern Gait velocity: slowed Gait velocity interpretation: <1.31 ft/sec, indicative of household ambulator General Gait Details: pt performed several short bouts of walking. No assist to walk around foot of bed several times, used foot board for support. Min HHA to ambulate 20', mildly unsteady   Stairs             Wheelchair Mobility    Modified Rankin (Stroke Patients Only)       Balance Overall balance assessment: Needs assistance Sitting-balance support: Bilateral upper extremity supported;Feet supported Sitting balance-Leahy Scale: Good Sitting balance - Comments: pt performed reaching activities in sitting, fwd, back, and post with twisting, no LOB and able to bring self back to midline   Standing balance support: Bilateral upper extremity supported Standing balance-Leahy Scale: Fair Standing balance comment: able to maintain standing without support, had pt assist with changing bed linens, able to reach within BOS.                            Cognition Arousal/Alertness: Awake/alert Behavior During Therapy:  WFL for tasks assessed/performed Overall Cognitive Status: No family/caregiver present to determine baseline cognitive functioning                         Following Commands: Follows multi-step commands with increased time;Follows one step commands  consistently Safety/Judgement: Decreased awareness of safety;Decreased awareness of deficits Awareness: Emergent Problem Solving: Slow processing;Requires verbal cues General Comments: pt slow to respond but appropriate throughout      Exercises General Exercises - Upper Extremity Shoulder Flexion: AROM;Both;5 reps;Seated Elbow Flexion: AROM;Both;10 reps;Seated    General Comments General comments (skin integrity, edema, etc.): VSS. Pt fatigued after activity      Pertinent Vitals/Pain Pain Assessment: Faces Faces Pain Scale: Hurts even more Pain Location: generalized and R shoulder Pain Descriptors / Indicators: Grimacing;Moaning Pain Intervention(s): Monitored during session    Home Living                      Prior Function            PT Goals (current goals can now be found in the care plan section) Acute Rehab PT Goals Patient Stated Goal: to go home PT Goal Formulation: With patient Time For Goal Achievement: 12/17/20 Potential to Achieve Goals: Fair Progress towards PT goals: Progressing toward goals    Frequency    Min 3X/week      PT Plan Current plan remains appropriate    Co-evaluation              AM-PAC PT "6 Clicks" Mobility   Outcome Measure  Help needed turning from your back to your side while in a flat bed without using bedrails?: None Help needed moving from lying on your back to sitting on the side of a flat bed without using bedrails?: None Help needed moving to and from a bed to a chair (including a wheelchair)?: A Little Help needed standing up from a chair using your arms (e.g., wheelchair or bedside chair)?: A Little Help needed to walk in hospital room?: A Little Help needed climbing 3-5 steps with a railing? : A Lot 6 Click Score: 19    End of Session Equipment Utilized During Treatment: Gait belt Activity Tolerance: Patient limited by pain;Patient limited by fatigue Patient left: with call bell/phone within  reach;in bed;with nursing/sitter in room Nurse Communication: Mobility status PT Visit Diagnosis: Other abnormalities of gait and mobility (R26.89);Difficulty in walking, not elsewhere classified (R26.2);Pain;Muscle weakness (generalized) (M62.81) Pain - Right/Left: Right Pain - part of body: Shoulder     Time: 9030-0923 PT Time Calculation (min) (ACUTE ONLY): 26 min  Charges:  $Gait Training: 8-22 mins $Therapeutic Activity: 8-22 mins                     Lyanne Co, PT  Acute Rehab Services  Pager [phone] Office [phone]    Lawana Chambers Alannah Averhart 12/08/2020, 10:18 AM

## 2020-12-08 NOTE — Progress Notes (Signed)
Subjective:  She is feeling better though slightly short of breath after coming back from the restroom  Antibiotics:  Anti-infectives (From admission, onward)   Start     Dose/Rate Route Frequency Ordered Stop   12/09/20 1200  anidulafungin (ERAXIS) 100 mg in sodium chloride 0.9 % 100 mL IVPB        100 mg 78 mL/hr over 100 Minutes Intravenous Every 24 hours 12/08/20 1109     12/08/20 1200  anidulafungin (ERAXIS) 200 mg in sodium chloride 0.9 % 200 mL IVPB        200 mg 78 mL/hr over 200 Minutes Intravenous  Once 12/08/20 1109     12/06/20 0833  vancomycin (VANCOCIN) powder  Status:  Discontinued          As needed 12/06/20 0834 12/06/20 0852   12/06/20 0600  ceFAZolin (ANCEF) IVPB 2g/100 mL premix        2 g 200 mL/hr over 30 Minutes Intravenous On call to O.R. 12/06/20 0306 12/06/20 0755   11/28/20 2000  DAPTOmycin (CUBICIN) 764 mg in sodium chloride 0.9 % IVPB        10 mg/kg  76.4 kg 230.6 mL/hr over 30 Minutes Intravenous Daily 11/28/20 0727     11/24/20 2000  DAPTOmycin (CUBICIN) 827 mg in sodium chloride 0.9 % IVPB  Status:  Discontinued        10 mg/kg  82.7 kg 233.1 mL/hr over 30 Minutes Intravenous Daily 11/24/20 0821 11/28/20 0727   11/19/20 1000  DAPTOmycin (CUBICIN) 770 mg in sodium chloride 0.9 % IVPB  Status:  Discontinued        10 mg/kg  77 kg 230.8 mL/hr over 30 Minutes Intravenous Daily 11/19/20 0908 11/24/20 0821   11/19/20 1000  doxycycline (VIBRA-TABS) tablet 100 mg        100 mg Oral Every 12 hours 11/19/20 0908 11/23/20 2123   11/19/20 0439  vancomycin variable dose per unstable renal function (pharmacist dosing)  Status:  Discontinued         Does not apply See admin instructions 11/19/20 0439 11/19/20 0853   11/16/20 2000  vancomycin (VANCOREADY) IVPB 1250 mg/250 mL  Status:  Discontinued        1,250 mg 166.7 mL/hr over 90 Minutes Intravenous Every 24 hours 11/15/20 1933 11/16/20 1108   11/16/20 1200  vancomycin (VANCOCIN) IVPB 1000 mg/200  mL premix  Status:  Discontinued        1,000 mg 200 mL/hr over 60 Minutes Intravenous Every 12 hours 11/16/20 1108 11/19/20 0439   11/16/20 0745  metroNIDAZOLE (FLAGYL) IVPB 500 mg        500 mg 100 mL/hr over 60 Minutes Intravenous  Once 11/16/20 0741 11/16/20 0907   11/15/20 2237  vancomycin (VANCOCIN) 500 MG powder       Note to Pharmacy: Ihor Dow   : cabinet override      11/15/20 2237 11/16/20 1044   11/15/20 2000  ceFEPIme (MAXIPIME) 2 g in sodium chloride 0.9 % 100 mL IVPB  Status:  Discontinued        2 g 200 mL/hr over 30 Minutes Intravenous Every 12 hours 11/15/20 1933 11/16/20 1212   11/15/20 1945  vancomycin (VANCOREADY) IVPB 1500 mg/300 mL  Status:  Discontinued        1,500 mg 150 mL/hr over 120 Minutes Intravenous  Once 11/15/20 1933 11/15/20 1939   11/15/20 1945  vancomycin (VANCOCIN) IVPB 1000 mg/200 mL premix       "  And" Linked Group Details   1,000 mg 200 mL/hr over 60 Minutes Intravenous  Once 11/15/20 1939 11/15/20 2231   11/15/20 1945  vancomycin (VANCOREADY) IVPB 500 mg/100 mL       "And" Linked Group Details   500 mg 100 mL/hr over 60 Minutes Intravenous  Once 11/15/20 1939 11/16/20 0002      Medications: Scheduled Meds: . amLODipine  10 mg Oral Daily  . chlorhexidine  15 mL Mouth Rinse BID  . Chlorhexidine Gluconate Cloth  6 each Topical Daily  . cloNIDine  0.1 mg Oral BID  . dicyclomine  20 mg Oral TID AC & HS  . docusate sodium  100 mg Oral BID  . feeding supplement  237 mL Oral QID  . folic acid  1 mg Oral Daily  . heparin  5,000 Units Subcutaneous Q8H  . mouth rinse  15 mL Mouth Rinse q12n4p  . methadone  10 mg Oral Q8H  . multivitamin with minerals  1 tablet Oral Daily  . pantoprazole  40 mg Oral Daily  . polyethylene glycol  17 g Oral BID  . QUEtiapine  25 mg Oral BID  . senna  2 tablet Oral Daily  . sodium chloride flush  10-40 mL Intracatheter Q12H  . thiamine  100 mg Oral Daily   Continuous Infusions: . sodium chloride Stopped  (12/05/20 0656)  . [START ON 12/09/2020] anidulafungin    . anidulafungin    . DAPTOmycin (CUBICIN)  IV Stopped (12/07/20 2051)  . lactated ringers 10 mL/hr at 12/06/20 0730   PRN Meds:.sodium chloride, acetaminophen **OR** acetaminophen, bisacodyl, guaiFENesin, HYDROmorphone (DILAUDID) injection, levalbuterol, LORazepam, metoprolol tartrate, nicotine, oxyCODONE, sodium chloride flush    Objective: Weight change:   Intake/Output Summary (Last 24 hours) at 12/08/2020 1218 Last data filed at 12/08/2020 1045 Gross per 24 hour  Intake 1295 ml  Output 755 ml  Net 540 ml   Blood pressure (!) 161/92, pulse (!) 110, temperature (!) 101 F (38.3 C), temperature source Oral, resp. rate 20, height  (1.702 m), weight 75.2 kg, last menstrual period 11/10/2020, SpO2 96 %. Temp:  [98 F (36.7 C)-101 F (38.3 C)] 101 F (38.3 C) (03/07 1045) Pulse Rate:  [104-119] 110 (03/07 1045) Resp:  [16-20] 20 (03/07 1045) BP: (111-161)/(66-95) 161/92 (03/07 1045) SpO2:  [94 %-98 %] 96 % (03/07 1045)  Physical Exam: Physical Exam Constitutional:      General: She is not in acute distress.    Appearance: She is well-developed and well-nourished. She is not diaphoretic.  HENT:     Head: Normocephalic and atraumatic.     Right Ear: External ear normal.     Left Ear: External ear normal.     Mouth/Throat:     Mouth: Oropharynx is clear and moist.     Pharynx: No oropharyngeal exudate.  Eyes:     General: No scleral icterus.    Extraocular Movements: EOM normal.     Conjunctiva/sclera: Conjunctivae normal.     Pupils: Pupils are equal, round, and reactive to light.  Cardiovascular:     Rate and Rhythm: Regular rhythm. Tachycardia present.     Heart sounds: No murmur heard.   Pulmonary:     Effort: Prolonged expiration present. No respiratory distress.     Breath sounds: No wheezing.  Abdominal:     General: Bowel sounds are normal. There is no distension.     Palpations: Abdomen is soft.      Tenderness: There is no abdominal  tenderness. There is no rebound.  Musculoskeletal:        General: No tenderness or edema. Normal range of motion.  Lymphadenopathy:     Cervical: No cervical adenopathy.  Skin:    General: Skin is warm and dry.     Coloration: Skin is not pale.     Findings: No erythema or rash.  Neurological:     General: No focal deficit present.     Mental Status: She is alert and oriented to person, place, and time.     Motor: No abnormal muscle tone.     Coordination: Coordination normal.  Psychiatric:        Mood and Affect: Mood and affect normal.        Behavior: Behavior normal.        Thought Content: Thought content normal.        Judgment: Judgment normal.   Shoulder bandaged.    CBC:    BMET Recent Labs    12/07/20 0500 12/08/20 0500  NA 136 136  K 4.0 3.5  CL 103 104  CO2 23 22  GLUCOSE 107* 103*  BUN 33* 22*  CREATININE 1.96* 1.70*  CALCIUM 8.1* 8.1*     Liver Panel  Recent Labs    12/07/20 0500 12/08/20 0500  PROT 8.1 8.0  ALBUMIN 1.6* 1.8*  AST 20 20  ALT 19 16  ALKPHOS 65 72  BILITOT 0.5 0.4       Sedimentation Rate No results for input(s): ESRSEDRATE in the last 72 hours. C-Reactive Protein No results for input(s): CRP in the last 72 hours.  Micro Results: Recent Results (from the past 720 hour(s))  Resp Panel by RT-PCR (Flu A&B, Covid) Peripheral     Status: None   Collection Time: 11/15/20  4:10 PM   Specimen: Peripheral; Nasopharyngeal(NP) swabs in vial transport medium  Result Value Ref Range Status   SARS Coronavirus 2 by RT PCR NEGATIVE NEGATIVE Final    Comment: (NOTE) SARS-CoV-2 target nucleic acids are NOT DETECTED.  The SARS-CoV-2 RNA is generally detectable in upper respiratory specimens during the acute phase of infection. The lowest concentration of SARS-CoV-2 viral copies this assay can detect is 138 copies/mL. A negative result does not preclude SARS-Cov-2 infection and should not be  used as the sole basis for treatment or other patient management decisions. A negative result may occur with  improper specimen collection/handling, submission of specimen other than nasopharyngeal swab, presence of viral mutation(s) within the areas targeted by this assay, and inadequate number of viral copies(<138 copies/mL). A negative result must be combined with clinical observations, patient history, and epidemiological information. The expected result is Negative.  Fact Sheet for Patients:  BloggerCourse.com  Fact Sheet for Healthcare Providers:  SeriousBroker.it  This test is no t yet approved or cleared by the [place] FDA and  has been authorized for detection and/or diagnosis of SARS-CoV-2 by FDA under an Emergency Use Authorization (EUA). This EUA will remain  in effect (meaning this test can be used) for the duration of the COVID-19 declaration under Section 564(b)(1) of the Act, 21 U.S.C.section 360bbb-3(b)(1), unless the authorization is terminated  or revoked sooner.       Influenza A by PCR NEGATIVE NEGATIVE Final   Influenza B by PCR NEGATIVE NEGATIVE Final    Comment: (NOTE) The Xpert Xpress SARS-CoV-2/FLU/RSV plus assay is intended as an aid in the diagnosis of influenza from Nasopharyngeal swab specimens and should not be used as a sole  basis for treatment. Nasal washings and aspirates are unacceptable for Xpert Xpress SARS-CoV-2/FLU/RSV testing.  Fact Sheet for Patients: BloggerCourse.com  Fact Sheet for Healthcare Providers: SeriousBroker.it  This test is not yet approved or cleared by the [place] FDA and has been authorized for detection and/or diagnosis of SARS-CoV-2 by FDA under an Emergency Use Authorization (EUA). This EUA will remain in effect (meaning this test can be used) for the duration of the COVID-19 declaration under Section  564(b)(1) of the Act, 21 U.S.C. section 360bbb-3(b)(1), unless the authorization is terminated or revoked.  Performed at [facility], [address] [place], [place] [address]   Blood culture (routine x 2)     Status: Abnormal   Collection Time: 11/15/20  4:10 PM   Specimen: BLOOD  Result Value Ref Range Status   Specimen Description   Final    BLOOD LEFT ANTECUBITAL Performed at [facility], [address] [place], [place] [address]    Special Requests   Final    BOTTLES DRAWN AEROBIC AND ANAEROBIC Blood Culture adequate volume Performed at [facility], [address] [place], [place] [address]    Culture  Setup Time   Final    GRAM POSITIVE COCCI IN BOTH AEROBIC AND ANAEROBIC BOTTLES CRITICAL RESULT CALLED TO, READ BACK BY AND VERIFIED WITH: CINDY REED,RN @0956  11/16/20 EB Performed at [facility], [address], [place], [place]    Culture METHICILLIN RESISTANT STAPHYLOCOCCUS AUREUS (A)  Final   Report Status 11/18/2020 FINAL  Final   Organism ID, Bacteria METHICILLIN RESISTANT STAPHYLOCOCCUS AUREUS  Final      Susceptibility   Methicillin resistant staphylococcus aureus - MIC*    CIPROFLOXACIN <=0.5 SENSITIVE Sensitive     ERYTHROMYCIN >=8 RESISTANT Resistant     GENTAMICIN <=0.5 SENSITIVE Sensitive     OXACILLIN >=4 RESISTANT Resistant     TETRACYCLINE <=1 SENSITIVE Sensitive     VANCOMYCIN 1 SENSITIVE Sensitive     TRIMETH/SULFA <=10 SENSITIVE Sensitive     CLINDAMYCIN <=0.25 SENSITIVE Sensitive     RIFAMPIN <=0.5 SENSITIVE Sensitive     Inducible Clindamycin NEGATIVE Sensitive     * METHICILLIN RESISTANT STAPHYLOCOCCUS AUREUS  Blood Culture ID Panel (Reflexed)     Status: Abnormal   Collection Time: 11/15/20  4:10 PM  Result Value Ref Range Status   Enterococcus faecalis NOT DETECTED NOT DETECTED Final   Enterococcus Faecium NOT DETECTED NOT DETECTED Final   Listeria monocytogenes NOT DETECTED NOT DETECTED  Final   Staphylococcus species DETECTED (A) NOT DETECTED Final    Comment: CRITICAL RESULT CALLED TO, READ BACK BY AND VERIFIED WITH: CINDY REED,RN @0956  11/16/20 EB    Staphylococcus aureus (BCID) DETECTED (A) NOT DETECTED Final    Comment: Methicillin (oxacillin)-resistant Staphylococcus aureus (MRSA). MRSA is predictably resistant to beta-lactam antibiotics (except ceftaroline). Preferred therapy is vancomycin unless clinically contraindicated. Patient requires contact precautions if  hospitalized. CRITICAL RESULT CALLED TO, READ BACK BY AND VERIFIED WITH: CINDY REED,RN @0956  11/16/20 EB    Staphylococcus epidermidis NOT DETECTED NOT DETECTED Final   Staphylococcus lugdunensis NOT DETECTED NOT DETECTED Final   Streptococcus species NOT DETECTED NOT DETECTED Final   Streptococcus agalactiae NOT DETECTED NOT DETECTED Final   Streptococcus pneumoniae NOT DETECTED NOT DETECTED Final   Streptococcus pyogenes NOT DETECTED NOT DETECTED Final   A.calcoaceticus-baumannii NOT DETECTED NOT DETECTED Final   Bacteroides fragilis NOT DETECTED NOT DETECTED Final   Enterobacterales  NOT DETECTED NOT DETECTED Final   Enterobacter cloacae complex NOT DETECTED NOT DETECTED Final   Escherichia coli NOT DETECTED NOT DETECTED Final   Klebsiella aerogenes NOT DETECTED NOT DETECTED Final   Klebsiella oxytoca NOT DETECTED NOT DETECTED Final   Klebsiella pneumoniae NOT DETECTED NOT DETECTED Final   Proteus species NOT DETECTED NOT DETECTED Final   Salmonella species NOT DETECTED NOT DETECTED Final   Serratia marcescens NOT DETECTED NOT DETECTED Final   Haemophilus influenzae NOT DETECTED NOT DETECTED Final   Neisseria meningitidis NOT DETECTED NOT DETECTED Final   Pseudomonas aeruginosa NOT DETECTED NOT DETECTED Final   Stenotrophomonas maltophilia NOT DETECTED NOT DETECTED Final   Candida albicans NOT DETECTED NOT DETECTED Final   Candida auris NOT DETECTED NOT DETECTED Final   Candida glabrata NOT DETECTED  NOT DETECTED Final   Candida krusei NOT DETECTED NOT DETECTED Final   Candida parapsilosis NOT DETECTED NOT DETECTED Final   Candida tropicalis NOT DETECTED NOT DETECTED Final   Cryptococcus neoformans/gattii NOT DETECTED NOT DETECTED Final   Meth resistant mecA/C and MREJ DETECTED (A) NOT DETECTED Final    Comment: CRITICAL RESULT CALLED TO, READ BACK BY AND VERIFIED WITH: CINDY REED,RN  11/16/20 EB Performed at [facility], [address], [place], [place] [address]   Blood culture (routine x 2)     Status: Abnormal   Collection Time: 11/15/20  4:25 PM   Specimen: BLOOD  Result Value Ref Range Status   Specimen Description   Final    BLOOD LEFT ANTECUBITAL Performed at [facility], [address] [place], [place] [address]    Special Requests   Final    BOTTLES DRAWN AEROBIC ONLY Blood Culture results may not be optimal due to an inadequate volume of blood received in culture bottles Performed at [facility], [address] [place], [place] [address]    Culture  Setup Time   Final    GRAM POSITIVE COCCI IN CLUSTERS AEROBIC BOTTLE ONLY CRITICAL VALUE NOTED.  VALUE IS CONSISTENT WITH PREVIOUSLY REPORTED AND CALLED VALUE.    Culture (A)  Final    STAPHYLOCOCCUS AUREUS SUSCEPTIBILITIES PERFORMED ON PREVIOUS CULTURE WITHIN THE LAST 5 DAYS. Performed at [facility], [address], [place], [place] [address]    Report Status 11/18/2020 FINAL  Final  Urine culture     Status: Abnormal   Collection Time: 11/15/20  7:47 PM   Specimen: Urine, Catheterized  Result Value Ref Range Status   Specimen Description   Final    URINE, CATHETERIZED Performed at [facility], [address] [place], [place] [address]    Special Requests   Final    Normal Performed at [facility], [address] [place], [place] [address]    Culture (A)  Final    >=100,000 COLONIES/mL METHICILLIN RESISTANT STAPHYLOCOCCUS AUREUS   Report  Status 11/18/2020 FINAL  Final   Organism ID, Bacteria METHICILLIN RESISTANT STAPHYLOCOCCUS AUREUS (A)  Final      Susceptibility   Methicillin resistant staphylococcus aureus - MIC*    CIPROFLOXACIN <=0.5 SENSITIVE Sensitive     GENTAMICIN <=0.5 SENSITIVE Sensitive     NITROFURANTOIN <=16 SENSITIVE Sensitive     OXACILLIN >=4 RESISTANT Resistant     TETRACYCLINE <=1 SENSITIVE Sensitive     VANCOMYCIN <=0.5 SENSITIVE Sensitive     TRIMETH/SULFA <=10 SENSITIVE Sensitive     CLINDAMYCIN <=0.25 SENSITIVE Sensitive  RIFAMPIN <=0.5 SENSITIVE Sensitive     Inducible Clindamycin NEGATIVE Sensitive     * >=100,000 COLONIES/mL METHICILLIN RESISTANT STAPHYLOCOCCUS AUREUS  Culture, blood (single)     Status: Abnormal   Collection Time: 11/15/20  9:35 PM   Specimen: BLOOD RIGHT HAND  Result Value Ref Range Status   Specimen Description   Final    BLOOD RIGHT HAND Performed at [facility], [address] [place], [place] [address]    Special Requests   Final    BOTTLES DRAWN AEROBIC AND ANAEROBIC Blood Culture adequate volume Performed at [facility], [address] [place], [place] [address]    Culture  Setup Time   Final    GRAM POSITIVE COCCI IN CLUSTERS IN BOTH AEROBIC AND ANAEROBIC BOTTLES CRITICAL VALUE NOTED.  VALUE IS CONSISTENT WITH PREVIOUSLY REPORTED AND CALLED VALUE.    Culture (A)  Final    STAPHYLOCOCCUS AUREUS SUSCEPTIBILITIES PERFORMED ON PREVIOUS CULTURE WITHIN THE LAST 5 DAYS. Performed at [facility], [address] [place], [place] [address]    Report Status 11/20/2020 FINAL  Final  Culture, blood (routine x 2)     Status: None   Collection Time: 11/17/20  9:28 AM   Specimen: BLOOD RIGHT FOREARM  Result Value Ref Range Status   Specimen Description BLOOD RIGHT FOREARM  Final   Special Requests   Final    BOTTLES DRAWN AEROBIC AND ANAEROBIC Blood Culture results may not be optimal due to an inadequate volume of blood received in  culture bottles   Culture   Final    NO GROWTH 5 DAYS Performed at [facility], [address] [place], [place] [address]    Report Status 11/22/2020 FINAL  Final  Culture, blood (routine x 2)     Status: Abnormal   Collection Time: 11/17/20  9:39 AM   Specimen: BLOOD RIGHT HAND  Result Value Ref Range Status   Specimen Description BLOOD RIGHT HAND  Final   Special Requests   Final    BOTTLES DRAWN AEROBIC AND ANAEROBIC Blood Culture results may not be optimal due to an inadequate volume of blood received in culture bottles   Culture  Setup Time   Final    GRAM POSITIVE COCCI IN CLUSTERS ANAEROBIC BOTTLE ONLY Organism ID to follow CRITICAL RESULT CALLED TO, READ BACK BY AND VERIFIED WITHThresa Ross PHARMD, AT 1749 11/20/20 Renato Shin Performed at [facility], [address] [place], [place] [address]    Culture METHICILLIN RESISTANT STAPHYLOCOCCUS AUREUS (A)  Final   Report Status 11/22/2020 FINAL  Final   Organism ID, Bacteria METHICILLIN RESISTANT STAPHYLOCOCCUS AUREUS  Final      Susceptibility   Methicillin resistant staphylococcus aureus - MIC*    CIPROFLOXACIN <=0.5 SENSITIVE Sensitive     ERYTHROMYCIN >=8 RESISTANT Resistant     GENTAMICIN <=0.5 SENSITIVE Sensitive     OXACILLIN >=4 RESISTANT Resistant     TETRACYCLINE <=1 SENSITIVE Sensitive     VANCOMYCIN 1 SENSITIVE Sensitive     TRIMETH/SULFA <=10 SENSITIVE Sensitive     CLINDAMYCIN <=0.25 SENSITIVE Sensitive     RIFAMPIN <=0.5 SENSITIVE Sensitive     Inducible Clindamycin NEGATIVE Sensitive     * METHICILLIN RESISTANT STAPHYLOCOCCUS AUREUS  Blood Culture ID Panel (Reflexed)     Status: Abnormal   Collection Time: 11/17/20  9:39 AM  Result Value Ref Range Status   Enterococcus faecalis NOT DETECTED NOT  DETECTED Final   Enterococcus Faecium NOT DETECTED NOT DETECTED Final   Listeria monocytogenes NOT DETECTED NOT DETECTED Final   Staphylococcus species DETECTED (A) NOT DETECTED Final    Comment:  CRITICAL RESULT CALLED TO, READ BACK BY AND VERIFIED WITHThresa Ross: L VON DOHLEN PHARMD, AT [phone] 11/20/20 D. VANHOOK    Staphylococcus aureus (BCID) DETECTED (A) NOT DETECTED Final    Comment: Methicillin (oxacillin)-resistant Staphylococcus aureus (MRSA). MRSA is predictably resistant to beta-lactam antibiotics (except ceftaroline). Preferred therapy is vancomycin unless clinically contraindicated. Patient requires contact precautions if  hospitalized. CRITICAL RESULT CALLED TO, READ BACK BY AND VERIFIED WITHThresa Ross: L VON DOHLEN PHARMD, AT [phone] 11/20/20 D. VANHOOK    Staphylococcus epidermidis NOT DETECTED NOT DETECTED Final   Staphylococcus lugdunensis NOT DETECTED NOT DETECTED Final   Streptococcus species NOT DETECTED NOT DETECTED Final   Streptococcus agalactiae NOT DETECTED NOT DETECTED Final   Streptococcus pneumoniae NOT DETECTED NOT DETECTED Final   Streptococcus pyogenes NOT DETECTED NOT DETECTED Final   A.calcoaceticus-baumannii NOT DETECTED NOT DETECTED Final   Bacteroides fragilis NOT DETECTED NOT DETECTED Final   Enterobacterales NOT DETECTED NOT DETECTED Final   Enterobacter cloacae complex NOT DETECTED NOT DETECTED Final   Escherichia coli NOT DETECTED NOT DETECTED Final   Klebsiella aerogenes NOT DETECTED NOT DETECTED Final   Klebsiella oxytoca NOT DETECTED NOT DETECTED Final   Klebsiella pneumoniae NOT DETECTED NOT DETECTED Final   Proteus species NOT DETECTED NOT DETECTED Final   Salmonella species NOT DETECTED NOT DETECTED Final   Serratia marcescens NOT DETECTED NOT DETECTED Final   Haemophilus influenzae NOT DETECTED NOT DETECTED Final   Neisseria meningitidis NOT DETECTED NOT DETECTED Final   Pseudomonas aeruginosa NOT DETECTED NOT DETECTED Final   Stenotrophomonas maltophilia NOT DETECTED NOT DETECTED Final   Candida albicans NOT DETECTED NOT DETECTED Final   Candida auris NOT DETECTED NOT DETECTED Final   Candida glabrata NOT DETECTED NOT DETECTED Final   Candida krusei NOT  DETECTED NOT DETECTED Final   Candida parapsilosis NOT DETECTED NOT DETECTED Final   Candida tropicalis NOT DETECTED NOT DETECTED Final   Cryptococcus neoformans/gattii NOT DETECTED NOT DETECTED Final   Meth resistant mecA/C and MREJ DETECTED (A) NOT DETECTED Final    Comment: CRITICAL RESULT CALLED TO, READ BACK BY AND VERIFIED WITHThresa Ross: L VON DOHLEN PHARMD, AT [phone] 11/20/20 Renato Shin. VANHOOK Performed at [facility], [address] [place], [place] [address]   MRSA PCR Screening     Status: Abnormal   Collection Time: 11/17/20 11:38 AM   Specimen: Nasopharyngeal  Result Value Ref Range Status   MRSA by PCR POSITIVE (A) NEGATIVE Final    Comment:        The GeneXpert MRSA Assay (FDA approved for NASAL specimens only), is one component of a comprehensive MRSA colonization surveillance program. It is not intended to diagnose MRSA infection nor to guide or monitor treatment for MRSA infections. RESULT CALLED TO, READ BACK BY AND VERIFIED WITH: R ROBERTS RN 1400 11/17/20 A BROWNING Performed at [facility], [address] [place], [place] [id number]   Body fluid culture w Gram Stain     Status: None   Collection Time: 11/17/20  4:05 PM   Specimen: Synovium; Body Fluid  Result Value Ref Range Status   Specimen Description SYNOVIAL RIGHT SHOULDER  Final   Special Requests NONE  Final   Gram Stain   Final    ABUNDANT WBC PRESENT, PREDOMINANTLY PMN NO ORGANISMS SEEN  Culture   Final    RARE METHICILLIN RESISTANT STAPHYLOCOCCUS AUREUS CRITICAL RESULT CALLED TO, READ BACK BY AND VERIFIED WITH: RN T.SHELTON AT 1211 ON 11/19/2020 BY T.SAAD Performed at [facility], [address], [place], [place] [address]    Report Status 11/21/2020 FINAL  Final   Organism ID, Bacteria METHICILLIN RESISTANT STAPHYLOCOCCUS AUREUS  Final      Susceptibility   Methicillin resistant staphylococcus aureus - MIC*    CIPROFLOXACIN <=0.5 SENSITIVE Sensitive     ERYTHROMYCIN >=8 RESISTANT  Resistant     GENTAMICIN <=0.5 SENSITIVE Sensitive     OXACILLIN >=4 RESISTANT Resistant     TETRACYCLINE <=1 SENSITIVE Sensitive     VANCOMYCIN <=0.5 SENSITIVE Sensitive     TRIMETH/SULFA <=10 SENSITIVE Sensitive     CLINDAMYCIN <=0.25 SENSITIVE Sensitive     RIFAMPIN <=0.5 SENSITIVE Sensitive     Inducible Clindamycin NEGATIVE Sensitive     * RARE METHICILLIN RESISTANT STAPHYLOCOCCUS AUREUS  Culture, blood (Routine X 2) w Reflex to ID Panel     Status: None   Collection Time: 11/21/20  5:26 AM   Specimen: BLOOD RIGHT HAND  Result Value Ref Range Status   Specimen Description BLOOD RIGHT HAND  Final   Special Requests   Final    BOTTLES DRAWN AEROBIC ONLY Blood Culture adequate volume   Culture   Final    NO GROWTH 5 DAYS Performed at [facility], [address], [place], [place] [address]    Report Status 11/26/2020 FINAL  Final  Culture, blood (Routine X 2) w Reflex to ID Panel     Status: None   Collection Time: 11/21/20  5:26 AM   Specimen: BLOOD LEFT HAND  Result Value Ref Range Status   Specimen Description BLOOD LEFT HAND  Final   Special Requests   Final    BOTTLES DRAWN AEROBIC ONLY Blood Culture adequate volume   Culture   Final    NO GROWTH 5 DAYS Performed at [facility], [address] [place], [place] [address]    Report Status 11/26/2020 FINAL  Final  Aerobic/Anaerobic Culture w Gram Stain (surgical/deep wound)     Status: None (Preliminary result)   Collection Time: 12/06/20  8:39 AM   Specimen: PATH Other; Body Fluid  Result Value Ref Range Status   Specimen Description ABSCESS RIGHT SHOULDER ABSCESS SPEC A  Final   Special Requests RIGHT SHOULDER ABSCESS  Final   Gram Stain FEW WBC PRESENT, PREDOMINANTLY PMN RARE YEAST   Final   Culture   Final    NO GROWTH 2 DAYS NO ANAEROBES ISOLATED; CULTURE IN PROGRESS FOR 5 DAYS Performed at [facility], [address] [place], [place] [address]    Report Status PENDING  Incomplete     Studies/Results: No results found.    Assessment/Plan:  INTERVAL HISTORY: She has had orthopedic surgery on Saturday.  Limited culture showing a yeast     Principal Problem:   Severe sepsis due to methicillin resistant Staphylococcus aureus (MRSA) with acute organ dysfunction (HCC) Active Problems:   IV drug abuse (HCC)   Poor dentition   Malnutrition (HCC)   Thrombocytopenia (HCC)   Tobacco dependence   Acute cystitis without hematuria   Malnutrition of moderate degree   Abdominal pain   Left foot pain   Septic embolism (HCC)   Endocarditis of tricuspid valve   Staphylococcal arthritis of right shoulder (HCC)   MRSA bacteremia  Acute on chronic respiratory failure with hypoxia (HCC)    Anne Bautista is a 35 y.o. female with IV drug use MRSA bacteremia tricuspid had about tricuspid valve endocarditis with septic emboli to the lungs right septic shoulder status post orthopedic surgery which disclosed all to pull subdeltoid abscesses within the right side.  She has had I&D.  Cultures are now yielding a yeast  #1 disseminated MRSA infection with tricuspid valve endocarditis septic shoulder and septic emboli the lungs:  Continue daptomycin  #2 septic shoulder with MRSA and now Candida isolated  Continue daptomycin and add Eraxis.     LOS: 22 days   Acey Lav 12/08/2020, 12:18 PM

## 2020-12-08 NOTE — Progress Notes (Addendum)
PROGRESS NOTE  Anne Bautista UEA:[id number] DOB: 09/08/1986 DOA: 11/15/2020 PCP: Patient, No Pcp Per   LOS: 22 days   Brief Narrative / Interim history: 35 year old female with history of IV drug use, tobacco use, depression came to the hospital with myalgias and cough, he was septic on arrival admitted to the ICU.  She was found to have MRSA bacteremia, tricuspid valve endocarditis as well as septic emboli to lungs, kidneys.  Infectious disease and cardiothoracic surgery consulted.  Hospital course complicated by worsening right shoulder pain found to have septic arthritis.   Subjective / 24h Interval events: No shortness of breath, no chest pain, no abdominal pain, nausea or vomiting   Assessment & Plan: Principal Problem Severe sepsis due to MRSA bacteremia -Complicated by septic emboli to both lungs and kidneys, right shoulder possibly septic left ankle (MRI refused by patient).  She also has a right subdeltoid abscess. -ID, cardiothoracic surgery, orthopedic surgery following -Status post incision and drainage of right shoulder subdeltoid abscess with multiple cavities on 3/5 by Dr. Susa Simmonds. Drain in place, removal per ortho  -Continue daptomycin, monitor CK. Shoulder cultures now with yeast also. Defer to ID antifungals -Last blood cultures negative on 2/18, has a PICC line since 2/28. -Cardiothoracic surgery following for possible angiovac debridement once sepsis has cleared  Active Problems IV drug use, cocaine use, marijuana, tobacco -cessation was encouraged.  UDS was positive for cocaine.  Hep C and HIV are negative.  RPR is negative.  Acute hypoxic respiratory failure, narcotic withdrawal, bilateral pulmonary septic emboli -hypoxia resolved, remains on room air   Acute blood loss anemia, iron deficiency anemia -received 1 u pRBC on 3/6 for Hb of 6.0, Hb still in the 6 range today, transfuse an additional unit of pRBC  Acute cystitis without hematuria -MRSA in cultures, she is on  antibiotics  Nonoliguric AKI with ATN -creatinine peaked at 2.8, fluctuating but overall stable, 1.7 today   Hypertension-continue current regimen  Debility/deconditioning-mobilize  Moderate protein calorie malnutrition   Scheduled Meds: . amLODipine  10 mg Oral Daily  . chlorhexidine  15 mL Mouth Rinse BID  . Chlorhexidine Gluconate Cloth  6 each Topical Daily  . cloNIDine  0.1 mg Oral BID  . dicyclomine  20 mg Oral TID AC & HS  . docusate sodium  100 mg Oral BID  . feeding supplement  237 mL Oral QID  . folic acid  1 mg Oral Daily  . heparin  5,000 Units Subcutaneous Q8H  . mouth rinse  15 mL Mouth Rinse q12n4p  . methadone  10 mg Oral Q8H  . multivitamin with minerals  1 tablet Oral Daily  . pantoprazole  40 mg Oral Daily  . polyethylene glycol  17 g Oral BID  . QUEtiapine  25 mg Oral BID  . senna  2 tablet Oral Daily  . sodium chloride flush  10-40 mL Intracatheter Q12H  . thiamine  100 mg Oral Daily   Continuous Infusions: . sodium chloride Stopped (12/05/20 0656)  . DAPTOmycin (CUBICIN)  IV Stopped (12/07/20 2051)  . lactated ringers 10 mL/hr at 12/06/20 0730   PRN Meds:.sodium chloride, acetaminophen **OR** acetaminophen, bisacodyl, guaiFENesin, HYDROmorphone (DILAUDID) injection, levalbuterol, LORazepam, metoprolol tartrate, nicotine, oxyCODONE, sodium chloride flush  Diet Orders (From admission, onward)    Start     Ordered   12/06/20 1042  Diet regular Room service appropriate? Yes; Fluid consistency: Thin  Diet effective now       Question Answer Comment  Room service  appropriate? Yes   Fluid consistency: Thin      12/06/20 1041          DVT prophylaxis: heparin injection 5,000 Units Start: 11/20/20 1400 SCDs Start: 11/16/20 1257     Code Status: Full Code  Family Communication: no family at bedside   Status is: Inpatient  Remains inpatient appropriate because:Inpatient level of care appropriate due to severity of illness   Dispo: The patient  is from: Home              Anticipated d/c is to: Home              Patient currently is not medically stable to d/c.   Difficult to place patient Yes  Level of care: Progressive  Consultants:  Infectious disease Cardiothoracic surgery Orthopedic surgery  Procedures:  2D echo:  1. Left ventricular ejection fraction, by estimation, is 55 to 60%. The left ventricle has normal function. The left ventricle has no regional wall motion abnormalities. Left ventricular diastolic parameters are consistent with Grade I diastolic dysfunction (impaired relaxation).  2. Right ventricular systolic function is normal. The right ventricular size is normal. There is normal pulmonary artery systolic pressure. The estimated right ventricular systolic pressure is 22.4 mmHg.  3. The mitral valve is normal in structure. No evidence of mitral valve regurgitation. No evidence of mitral stenosis.  4. 2 x 1 cm mobile vegetation attached to the tricuspid valve.  5. The aortic valve is tricuspid. Aortic valve regurgitation is not visualized. No aortic stenosis is present.  6. The inferior vena cava is normal in size with greater than 50% respiratory variability, suggesting right atrial pressure of 3 mmHg.   Microbiology  MRSA blood cultures   Antimicrobials: Daptomycin     Objective: Vitals:   12/08/20 0407 12/08/20 0815 12/08/20 0845 12/08/20 0852  BP: 134/74 140/83 (!) 159/82   Pulse: (!) 109 (!) 110 (!) 115 (!) 110  Resp: 18 20 20    Temp: 98 F (36.7 C) 99.1 F (37.3 C) 99 F (37.2 C)   TempSrc: Oral Oral Oral   SpO2: 98% 97% 95%   Weight:      Height:        Intake/Output Summary (Last 24 hours) at 12/08/2020 0950 Last data filed at 12/08/2020 02/07/2021 Gross per 24 hour  Intake 845 ml  Output 755 ml  Net 90 ml   Filed Weights   11/30/20 0500 12/01/20 0328 12/02/20 0300  Weight: 77.6 kg 74 kg 75.2 kg    Examination:  Constitutional: nad Eyes: no icterus  ENMT: mmm Neck: normal,  supple Respiratory: cta biL, no wheezing, no crackles  Cardiovascular: rrr, no edema  Abdomen: soft, nt, nd, bs+ Musculoskeletal: no clubbing / cyanosis. Right shoulder dressing CDI, drain in place\ Skin: no rashes Neurologic: non focal   Data Reviewed: I have independently reviewed following labs and imaging studies   CBC: Recent Labs  Lab 12/02/20 0150 12/03/20 0440 12/07/20 0500 12/07/20 1812 12/08/20 0500  WBC 8.3 12.1* 8.6  --  11.2*  HGB 7.1* 7.4* 6.0* 7.1* 6.6*  HCT 22.6* 22.7* 19.8* 22.6* 20.8*  MCV 84.0 82.5 85.0  --  83.5  PLT 204 283 288  --  315   Basic Metabolic Panel: Recent Labs  Lab 12/02/20 0150 12/03/20 0440 12/07/20 0500 12/08/20 0500  NA 136 136 136 136  K 4.2 4.4 4.0 3.5  CL 102 103 103 104  CO2 25 24 23 22   GLUCOSE 120* 106* 107*  103*  BUN 35* 31* 33* 22*  CREATININE 1.64* 1.51* 1.96* 1.70*  CALCIUM 8.1* 8.1* 8.1* 8.1*  MG  --  2.2  --  1.8  PHOS  --   --   --  3.7   Liver Function Tests: Recent Labs  Lab 12/02/20 0150 12/07/20 0500 12/08/20 0500  AST 41 20 20  ALT 29 19 16   ALKPHOS 98 65 72  BILITOT 0.6 0.5 0.4  PROT 7.8 8.1 8.0  ALBUMIN 1.6* 1.6* 1.8*   Coagulation Profile: No results for input(s): INR, PROTIME in the last 168 hours. HbA1C: No results for input(s): HGBA1C in the last 72 hours. CBG: Recent Labs  Lab 12/07/20 0627 12/07/20 1238 12/07/20 1818 12/07/20 2336 12/08/20 0605  GLUCAP 104* 98 92 88 93    Recent Results (from the past 240 hour(s))  Aerobic/Anaerobic Culture w Gram Stain (surgical/deep wound)     Status: None (Preliminary result)   Collection Time: 12/06/20  8:39 AM   Specimen: PATH Other; Body Fluid  Result Value Ref Range Status   Specimen Description ABSCESS RIGHT SHOULDER ABSCESS SPEC A  Final   Special Requests RIGHT SHOULDER ABSCESS  Final   Gram Stain FEW WBC PRESENT, PREDOMINANTLY PMN RARE YEAST   Final   Culture   Final    NO GROWTH 1 DAY Performed at [facility], [address], [place], [place]    Report Status PENDING  Incomplete     Radiology Studies: No results found.  [id number], MD, PhD [facility]  Between 7 am - 7 pm I am available, please contact me via Amion or Securechat  Between 7 pm - 7 am I am not available, please contact night coverage MD/APP via Amion

## 2020-12-09 LAB — BASIC METABOLIC PANEL
Anion gap: 9 (ref 5–15)
BUN: 16 mg/dL (ref 6–20)
CO2: 23 mmol/L (ref 22–32)
Calcium: 8.3 mg/dL — ABNORMAL LOW (ref 8.9–10.3)
Chloride: 103 mmol/L (ref 98–111)
Creatinine, Ser: 1.43 mg/dL — ABNORMAL HIGH (ref 0.44–1.00)
GFR, Estimated: 49 mL/min — ABNORMAL LOW (ref >60)
Glucose, Bld: 108 mg/dL — ABNORMAL HIGH (ref 70–99)
Potassium: 3.2 mmol/L — ABNORMAL LOW (ref 3.5–5.1)
Sodium: 135 mmol/L (ref 135–145)

## 2020-12-09 LAB — TYPE AND SCREEN
ABO/RH(D): B POS
Antibody Screen: NEGATIVE
Unit division: 0
Unit division: 0

## 2020-12-09 LAB — CBC
HCT: 24.2 % — ABNORMAL LOW (ref 36.0–46.0)
Hemoglobin: 8 g/dL — ABNORMAL LOW (ref 12.0–15.0)
MCH: 27.6 pg (ref 26.0–34.0)
MCHC: 33.1 g/dL (ref 30.0–36.0)
MCV: 83.4 fL (ref 80.0–100.0)
Platelets: 338 10*3/uL (ref 150–400)
RBC: 2.9 MIL/uL — ABNORMAL LOW (ref 3.87–5.11)
RDW: 17.8 % — ABNORMAL HIGH (ref 11.5–15.5)
WBC: 16.3 10*3/uL — ABNORMAL HIGH (ref 4.0–10.5)
nRBC: 0 % (ref 0.0–0.2)

## 2020-12-09 LAB — BPAM RBC
Blood Product Expiration Date: 202203222359
Blood Product Expiration Date: 202204032359
ISSUE DATE / TIME: 202203061150
ISSUE DATE / TIME: 202203070814
Unit Type and Rh: 7300
Unit Type and Rh: 7300

## 2020-12-09 LAB — GLUCOSE, CAPILLARY
Glucose-Capillary: 119 mg/dL — ABNORMAL HIGH (ref 70–99)
Glucose-Capillary: 132 mg/dL — ABNORMAL HIGH (ref 70–99)
Glucose-Capillary: 113 mg/dL — ABNORMAL HIGH (ref 70–99)
Glucose-Capillary: 87 mg/dL (ref 70–99)
Glucose-Capillary: 107 mg/dL — ABNORMAL HIGH (ref 70–99)

## 2020-12-09 MED ORDER — POTASSIUM CHLORIDE CRYS ER 20 MEQ PO TBCR
40.0000 meq | EXTENDED_RELEASE_TABLET | Freq: Once | ORAL | Status: AC
  Administered 2020-12-09: 40 meq via ORAL
  Filled 2020-12-09: qty 2

## 2020-12-09 NOTE — Progress Notes (Signed)
Physical Therapy Treatment Patient Details Name: Anne Bautista MRN: [id number] DOB: Jun 02, 1986 Today's Date: 12/09/2020    History of Present Illness 35 yo admitted 2/14 with sepsis due to MRSA bacteremia in setting of IVDU. Rt shoulder pain s/p aspiration 2/14. Underwent I&D R shoulder 3/5. Pt had multiple subdeltoid abscesses. PMHx: IVDU, malnutrition, poor dentition.    PT Comments    Pt with better color and energy level today than yesterday. Resistant to getting up at first but easily encouraged. Pt worked on sitting and standing balance exercises with balloon that therapist brought. Pt then agreeable to ambulation out of room. With use of RW pt able to ambulate 9' with min-guard A. C/o B ankle pain by end of ambulation and discussed ROM and strengthening for B ankles. PT will continue to follow.    Follow Up Recommendations  SNF;Supervision for mobility/OOB     Equipment Recommendations  3in1 (PT)    Recommendations for Other Services OT consult     Precautions / Restrictions Precautions Precautions: Fall Precaution Comments: right shoulder, B ankles Restrictions Weight Bearing Restrictions: No    Mobility  Bed Mobility Overal bed mobility: Modified Independent Bed Mobility: Supine to Sit;Sit to Supine     Supine to sit: Modified independent (Device/Increase time) Sit to supine: Modified independent (Device/Increase time)   General bed mobility comments: increased time but no physical assist. Pt getting to EOB on her own in room    Transfers Overall transfer level: Needs assistance Equipment used: None Transfers: Sit to/from Stand Sit to Stand: Supervision         General transfer comment: supervision when first up, min-guard when she is fatigued  Ambulation/Gait Ambulation/Gait assistance: Min guard Gait Distance (Feet): 80 Feet Assistive device: Rolling walker (2 wheeled) Gait Pattern/deviations: Decreased step length - right;Decreased step length -  left;Shuffle;Step-through pattern Gait velocity: slowed Gait velocity interpretation: 1.31 - 2.62 ft/sec, indicative of limited community ambulator General Gait Details: pt's ankles bothering her by end of distance, min-guard with RW, no physical assist given   Stairs             Wheelchair Mobility    Modified Rankin (Stroke Patients Only)       Balance Overall balance assessment: Needs assistance Sitting-balance support: Bilateral upper extremity supported;Feet supported Sitting balance-Leahy Scale: Good     Standing balance support: Bilateral upper extremity supported Standing balance-Leahy Scale: Good Standing balance comment: worked on standing balance with balloon hitting back and forth. Pt seemed to enjoy activity                            Cognition Arousal/Alertness: Awake/alert Behavior During Therapy: WFL for tasks assessed/performed Overall Cognitive Status: No family/caregiver present to determine baseline cognitive functioning Area of Impairment: Attention;Following commands;Safety/judgement;Awareness;Problem solving                   Current Attention Level: Selective Memory: Decreased short-term memory Following Commands: Follows multi-step commands with increased time;Follows one step commands consistently Safety/Judgement: Decreased awareness of safety;Decreased awareness of deficits Awareness: Emergent Problem Solving: Slow processing;Requires verbal cues General Comments: pt with quicker responses today than yesterday, only mildly delayed      Exercises      General Comments General comments (skin integrity, edema, etc.): increased coughing when ambulating. VSS      Pertinent Vitals/Pain Pain Assessment: Faces Faces Pain Scale: Hurts little more Pain Location: B ankles and R shoulder Pain Descriptors / Indicators:  Grimacing Pain Intervention(s): Limited activity within patient's tolerance;Monitored during session     Home Living                      Prior Function            PT Goals (current goals can now be found in the care plan section) Acute Rehab PT Goals Patient Stated Goal: to go home PT Goal Formulation: With patient Time For Goal Achievement: 12/17/20 Potential to Achieve Goals: Fair Progress towards PT goals: Progressing toward goals    Frequency    Min 3X/week      PT Plan Current plan remains appropriate    Bautista-evaluation              AM-PAC PT "6 Clicks" Mobility   Outcome Measure  Help needed turning from your back to your side while in a flat bed without using bedrails?: None Help needed moving from lying on your back to sitting on the side of a flat bed without using bedrails?: None Help needed moving to and from a bed to a chair (including a wheelchair)?: A Little Help needed standing up from a chair using your arms (e.g., wheelchair or bedside chair)?: A Little Help needed to walk in hospital room?: A Little Help needed climbing 3-5 steps with a railing? : A Lot 6 Click Score: 19    End of Session   Activity Tolerance: Patient tolerated treatment well Patient left: with call bell/phone within reach;in bed Nurse Communication: Mobility status PT Visit Diagnosis: Other abnormalities of gait and mobility (R26.89);Difficulty in walking, not elsewhere classified (R26.2);Pain;Muscle weakness (generalized) (M62.81) Pain - Right/Left: Right Pain - part of body: Shoulder;Ankle and joints of foot     Time: 1038-1100 PT Time Calculation (min) (ACUTE ONLY): 22 min  Charges:  $Gait Training: 8-22 mins                     .Anne Bautista, PT  Acute Rehab Services  Pager [phone] Office [phone]    Anne Bautista 12/09/2020, 12:15 PM

## 2020-12-09 NOTE — Progress Notes (Signed)
PROGRESS NOTE  Anne Bautista QAS:[id number] DOB: [birth year] DOA: 11/15/2020 PCP: Patient, No Pcp Per   LOS: 23 days   Brief Narrative / Interim history: 35 year old female with history of IV drug use, tobacco use, depression came to the hospital with myalgias and cough, he was septic on arrival admitted to the ICU.  She was found to have MRSA bacteremia, tricuspid valve endocarditis as well as septic emboli to lungs, kidneys.  Infectious disease and cardiothoracic surgery consulted.  Hospital course complicated by worsening right shoulder pain found to have septic arthritis, with cultures showing yeast   Subjective / 24h Interval events: Complains of shoulder pain, no chest pain, no dyspnea. Reports some nausea but no vomiting. Eating well  Assessment & Plan: Principal Problem Severe sepsis due to MRSA bacteremia -Complicated by septic emboli to both lungs and kidneys, right shoulder possibly septic left ankle (MRI refused by patient).  She also has a right subdeltoid abscess. Left ankle improving, no further issues, no pain -ID, cardiothoracic surgery, orthopedic surgery following -Status post incision and drainage of right shoulder subdeltoid abscess with multiple cavities on 3/5 by Dr. Susa Simmonds. Drain in place, removal per ortho  -Continue daptomycin, monitor CK. Shoulder cultures now with yeast also and started on anidulafungin pending sensitivities -Last blood cultures negative on 2/18, has a PICC line since 2/28. -Cardiothoracic surgery following for possible angiovac debridement once sepsis has cleared  Active Problems IV drug use, cocaine use, marijuana, tobacco -cessation was encouraged.  UDS was positive for cocaine.  Hep C and HIV are negative.  RPR is negative.  Acute hypoxic respiratory failure, narcotic withdrawal, bilateral pulmonary septic emboli -hypoxia resolved, remains on room air   Acute blood loss anemia, iron deficiency anemia -post operatively, received a total of 2U  pRBC, one on 3/6 and one on 3/7  Acute cystitis without hematuria -MRSA in cultures, she is on antibiotics  Nonoliguric AKI with ATN -creatinine peaked at 2.8, fluctuating but overall stable, 1.4 today   Hypertension-continue current regimen  Debility/deconditioning-mobilize  Moderate protein calorie malnutrition   Scheduled Meds: . amLODipine  10 mg Oral Daily  . chlorhexidine  15 mL Mouth Rinse BID  . Chlorhexidine Gluconate Cloth  6 each Topical Daily  . cloNIDine  0.1 mg Oral BID  . dicyclomine  20 mg Oral TID AC & HS  . docusate sodium  100 mg Oral BID  . feeding supplement  237 mL Oral QID  . folic acid  1 mg Oral Daily  . heparin  5,000 Units Subcutaneous Q8H  . mouth rinse  15 mL Mouth Rinse q12n4p  . methadone  10 mg Oral Q8H  . multivitamin with minerals  1 tablet Oral Daily  . pantoprazole  40 mg Oral Daily  . polyethylene glycol  17 g Oral BID  . QUEtiapine  25 mg Oral BID  . senna  2 tablet Oral Daily  . sodium chloride flush  10-40 mL Intracatheter Q12H  . thiamine  100 mg Oral Daily   Continuous Infusions: . sodium chloride Stopped (12/05/20 0656)  . anidulafungin    . DAPTOmycin (CUBICIN)  IV Stopped (12/08/20 2303)  . lactated ringers 10 mL/hr at 12/06/20 0730   PRN Meds:.sodium chloride, acetaminophen **OR** acetaminophen, bisacodyl, guaiFENesin, HYDROmorphone (DILAUDID) injection, levalbuterol, LORazepam, metoprolol tartrate, nicotine, oxyCODONE, sodium chloride flush  Diet Orders (From admission, onward)    Start     Ordered   12/06/20 1042  Diet regular Room service appropriate? Yes; Fluid consistency: Thin  Diet  effective now       Question Answer Comment  Room service appropriate? Yes   Fluid consistency: Thin      12/06/20 1041          DVT prophylaxis: heparin injection 5,000 Units Start: 11/20/20 1400 SCDs Start: 11/16/20 1257     Code Status: Full Code  Family Communication: no family at bedside   Status is: Inpatient  Remains  inpatient appropriate because:Inpatient level of care appropriate due to severity of illness   Dispo: The patient is from: Home              Anticipated d/c is to: Home              Patient currently is not medically stable to d/c.   Difficult to place patient Yes  Level of care: Progressive  Consultants:  Infectious disease Cardiothoracic surgery Orthopedic surgery  Procedures:  2D echo:  1. Left ventricular ejection fraction, by estimation, is 55 to 60%. The left ventricle has normal function. The left ventricle has no regional wall motion abnormalities. Left ventricular diastolic parameters are consistent with Grade I diastolic dysfunction (impaired relaxation).  2. Right ventricular systolic function is normal. The right ventricular size is normal. There is normal pulmonary artery systolic pressure. The estimated right ventricular systolic pressure is 22.4 mmHg.  3. The mitral valve is normal in structure. No evidence of mitral valve regurgitation. No evidence of mitral stenosis.  4. 2 x 1 cm mobile vegetation attached to the tricuspid valve.  5. The aortic valve is tricuspid. Aortic valve regurgitation is not visualized. No aortic stenosis is present.  6. The inferior vena cava is normal in size with greater than 50% respiratory variability, suggesting right atrial pressure of 3 mmHg.   Microbiology  MRSA blood cultures   Antimicrobials: Daptomycin     Objective: Vitals:   12/08/20 2100 12/08/20 2326 12/09/20 0401 12/09/20 0730  BP: (!) 145/89 (!) 143/82 (!) 141/85 137/87  Pulse: (!) 120 (!) 102 98 (!) 105  Resp: 18 18 18 18   Temp: 100.3 F (37.9 C) 99.3 F (37.4 C) 99 F (37.2 C) 99.9 F (37.7 C)  TempSrc: Oral Oral Oral Oral  SpO2: 98% 99% 98% 96%  Weight:      Height:        Intake/Output Summary (Last 24 hours) at 12/09/2020 0827 Last data filed at 12/09/2020 0500 Gross per 24 hour  Intake 1318.91 ml  Output 10 ml  Net 1308.91 ml   Filed Weights    11/30/20 0500 12/01/20 0328 12/02/20 0300  Weight: 77.6 kg 74 kg 75.2 kg    Examination:  Constitutional: in no distress, in bed Eyes: no scleral icterus  ENMT: mmm Neck: normal, supple Respiratory: lung clear bilaterally, no wheezing, no crackles  Cardiovascular: rrr, no mrg, no edema Abdomen: soft, nt, nd, bs+ Musculoskeletal: no clubbing / cyanosis. Right shoulder dressing cdi, drain in place Skin: no new rashes Neurologic: no focal deficits   Data Reviewed: I have independently reviewed following labs and imaging studies   CBC: Recent Labs  Lab 12/03/20 0440 12/07/20 0500 12/07/20 1812 12/08/20 0500 12/09/20 0414  WBC 12.1* 8.6  --  11.2* 16.3*  HGB 7.4* 6.0* 7.1* 6.6* 8.0*  HCT 22.7* 19.8* 22.6* 20.8* 24.2*  MCV 82.5 85.0  --  83.5 83.4  PLT 283 288  --  315 338   Basic Metabolic Panel: Recent Labs  Lab 12/03/20 0440 12/07/20 0500 12/08/20 0500 12/09/20 0414  NA  136 136 136 135  K 4.4 4.0 3.5 3.2*  CL 103 103 104 103  CO2 24 23 22 23   GLUCOSE 106* 107* 103* 108*  BUN 31* 33* 22* 16  CREATININE 1.51* 1.96* 1.70* 1.43*  CALCIUM 8.1* 8.1* 8.1* 8.3*  MG 2.2  --  1.8  --   PHOS  --   --  3.7  --    Liver Function Tests: Recent Labs  Lab 12/07/20 0500 12/08/20 0500  AST 20 20  ALT 19 16  ALKPHOS 65 72  BILITOT 0.5 0.4  PROT 8.1 8.0  ALBUMIN 1.6* 1.8*   Coagulation Profile: No results for input(s): INR, PROTIME in the last 168 hours. HbA1C: No results for input(s): HGBA1C in the last 72 hours. CBG: Recent Labs  Lab 12/08/20 0605 12/08/20 1841 12/08/20 2324 12/09/20 0606 12/09/20 0729  GLUCAP 93 92 195* 119* 132*    Recent Results (from the past 240 hour(s))  Aerobic/Anaerobic Culture w Gram Stain (surgical/deep wound)     Status: None (Preliminary result)   Collection Time: 12/06/20  8:39 AM   Specimen: PATH Other; Body Fluid  Result Value Ref Range Status   Specimen Description ABSCESS RIGHT SHOULDER ABSCESS SPEC A  Final   Special  Requests RIGHT SHOULDER ABSCESS  Final   Gram Stain FEW WBC PRESENT, PREDOMINANTLY PMN RARE YEAST   Final   Culture   Final    NO GROWTH 2 DAYS NO ANAEROBES ISOLATED; CULTURE IN PROGRESS FOR 5 DAYS Performed at [facility], [address], [place], [place]    Report Status PENDING  Incomplete     Radiology Studies: No results found.  [id number], MD, PhD [facility]  Between 7 am - 7 pm I am available, please contact me via Amion or Securechat  Between 7 pm - 7 am I am not available, please contact night coverage MD/APP via Amion

## 2020-12-09 NOTE — Progress Notes (Signed)
OT Cancellation Note  Patient Details Name: Magally Vahle MRN: [id number] DOB: 1985/10/30   Cancelled Treatment:    Reason Eval/Treat Not Completed: Pain limiting ability to participate;Other (comment) pt reports pain from HA declining OT session, will check back as time allows.  Pollyann Glen K., COTA/L Acute Rehabilitation Services [phone]   Barron Schmid 12/09/2020, 3:27 PM

## 2020-12-09 NOTE — Progress Notes (Signed)
Subjective:  She is feeling better though slightly short of breath after coming back from the restroom  Antibiotics:  Anti-infectives (From admission, onward)   Start     Dose/Rate Route Frequency Ordered Stop   12/09/20 1200  anidulafungin (ERAXIS) 100 mg in sodium chloride 0.9 % 100 mL IVPB  Status:  Discontinued        100 mg 78 mL/hr over 100 Minutes Intravenous Every 24 hours 12/08/20 1109 12/09/20 1013   12/08/20 1200  anidulafungin (ERAXIS) 200 mg in sodium chloride 0.9 % 200 mL IVPB        200 mg 78 mL/hr over 200 Minutes Intravenous  Once 12/08/20 1109 12/08/20 1724   12/06/20 0833  vancomycin (VANCOCIN) powder  Status:  Discontinued          As needed 12/06/20 0834 12/06/20 0852   12/06/20 0600  ceFAZolin (ANCEF) IVPB 2g/100 mL premix        2 g 200 mL/hr over 30 Minutes Intravenous On call to O.R. 12/06/20 0306 12/06/20 0755   11/28/20 2000  DAPTOmycin (CUBICIN) 764 mg in sodium chloride 0.9 % IVPB        10 mg/kg  76.4 kg 230.6 mL/hr over 30 Minutes Intravenous Daily 11/28/20 0727     11/24/20 2000  DAPTOmycin (CUBICIN) 827 mg in sodium chloride 0.9 % IVPB  Status:  Discontinued        10 mg/kg  82.7 kg 233.1 mL/hr over 30 Minutes Intravenous Daily 11/24/20 0821 11/28/20 0727   11/19/20 1000  DAPTOmycin (CUBICIN) 770 mg in sodium chloride 0.9 % IVPB  Status:  Discontinued        10 mg/kg  77 kg 230.8 mL/hr over 30 Minutes Intravenous Daily 11/19/20 0908 11/24/20 0821   11/19/20 1000  doxycycline (VIBRA-TABS) tablet 100 mg        100 mg Oral Every 12 hours 11/19/20 0908 11/23/20 2123   11/19/20 0439  vancomycin variable dose per unstable renal function (pharmacist dosing)  Status:  Discontinued         Does not apply See admin instructions 11/19/20 0439 11/19/20 0853   11/16/20 2000  vancomycin (VANCOREADY) IVPB 1250 mg/250 mL  Status:  Discontinued        1,250 mg 166.7 mL/hr over 90 Minutes Intravenous Every 24 hours 11/15/20 1933 11/16/20 1108    11/16/20 1200  vancomycin (VANCOCIN) IVPB 1000 mg/200 mL premix  Status:  Discontinued        1,000 mg 200 mL/hr over 60 Minutes Intravenous Every 12 hours 11/16/20 1108 11/19/20 0439   11/16/20 0745  metroNIDAZOLE (FLAGYL) IVPB 500 mg        500 mg 100 mL/hr over 60 Minutes Intravenous  Once 11/16/20 0741 11/16/20 0907   11/15/20 2237  vancomycin (VANCOCIN) 500 MG powder       Note to Pharmacy: [name], [name]   : cabinet override      11/15/20 2237 11/16/20 1044   11/15/20 2000  ceFEPIme (MAXIPIME) 2 g in sodium chloride 0.9 % 100 mL IVPB  Status:  Discontinued        2 g 200 mL/hr over 30 Minutes Intravenous Every 12 hours 11/15/20 1933 11/16/20 1212   11/15/20 1945  vancomycin (VANCOREADY) IVPB 1500 mg/300 mL  Status:  Discontinued        1,500 mg 150 mL/hr over 120 Minutes Intravenous  Once 11/15/20 1933 11/15/20 1939   11/15/20 1945  vancomycin (VANCOCIN) IVPB 1000 mg/200  mL premix       "And" Linked Group Details   1,000 mg 200 mL/hr over 60 Minutes Intravenous  Once 11/15/20 1939 11/15/20 2231   11/15/20 1945  vancomycin (VANCOREADY) IVPB 500 mg/100 mL       "And" Linked Group Details   500 mg 100 mL/hr over 60 Minutes Intravenous  Once 11/15/20 1939 11/16/20 0002      Medications: Scheduled Meds: . amLODipine  10 mg Oral Daily  . chlorhexidine  15 mL Mouth Rinse BID  . Chlorhexidine Gluconate Cloth  6 each Topical Daily  . cloNIDine  0.1 mg Oral BID  . dicyclomine  20 mg Oral TID AC & HS  . docusate sodium  100 mg Oral BID  . feeding supplement  237 mL Oral QID  . folic acid  1 mg Oral Daily  . heparin  5,000 Units Subcutaneous Q8H  . mouth rinse  15 mL Mouth Rinse q12n4p  . methadone  10 mg Oral Q8H  . multivitamin with minerals  1 tablet Oral Daily  . pantoprazole  40 mg Oral Daily  . polyethylene glycol  17 g Oral BID  . QUEtiapine  25 mg Oral BID  . senna  2 tablet Oral Daily  . sodium chloride flush  10-40 mL Intracatheter Q12H  . thiamine  100 mg Oral Daily    Continuous Infusions: . sodium chloride Stopped (12/05/20 0656)  . DAPTOmycin (CUBICIN)  IV Stopped (12/08/20 2303)  . lactated ringers 10 mL/hr at 12/06/20 0730   PRN Meds:.sodium chloride, acetaminophen **OR** acetaminophen, bisacodyl, guaiFENesin, HYDROmorphone (DILAUDID) injection, levalbuterol, LORazepam, metoprolol tartrate, nicotine, oxyCODONE, sodium chloride flush    Objective: Weight change:   Intake/Output Summary (Last 24 hours) at 12/09/2020 1204 Last data filed at 12/09/2020 0500 Gross per 24 hour  Intake 868.91 ml  Output 10 ml  Net 858.91 ml   Blood pressure 137/87, pulse (!) 105, temperature 99.9 F (37.7 C), temperature source Oral, resp. rate 18, height  (1.702 m), weight 75.2 kg, last menstrual period 11/10/2020, SpO2 96 %. Temp:  [98 F (36.7 C)-100.3 F (37.9 C)] 99.9 F (37.7 C) (03/08 0730) Pulse Rate:  [96-132] 105 (03/08 0730) Resp:  [18] 18 (03/08 0730) BP: (133-179)/(79-107) 137/87 (03/08 0730) SpO2:  [94 %-100 %] 96 % (03/08 0730)  Physical Exam: Physical Exam Constitutional:      General: She is not in acute distress.    Appearance: She is well-developed. She is not diaphoretic.  HENT:     Head: Normocephalic and atraumatic.     Right Ear: External ear normal.     Left Ear: External ear normal.     Mouth/Throat:     Pharynx: No oropharyngeal exudate.  Eyes:     General: No scleral icterus.    Conjunctiva/sclera: Conjunctivae normal.     Pupils: Pupils are equal, round, and reactive to light.  Cardiovascular:     Rate and Rhythm: Regular rhythm. Tachycardia present.     Heart sounds: No murmur heard.   Pulmonary:     Effort: Prolonged expiration present. No respiratory distress.     Breath sounds: No wheezing.  Abdominal:     General: Bowel sounds are normal. There is no distension.     Palpations: Abdomen is soft.     Tenderness: There is no abdominal tenderness. There is no rebound.  Musculoskeletal:        General: No  tenderness. Normal range of motion.  Lymphadenopathy:  Cervical: No cervical adenopathy.  Skin:    General: Skin is warm and dry.     Coloration: Skin is not pale.     Findings: No erythema or rash.  Neurological:     General: No focal deficit present.     Mental Status: She is alert and oriented to person, place, and time.     Motor: No abnormal muscle tone.     Coordination: Coordination normal.  Psychiatric:        Behavior: Behavior normal.        Thought Content: Thought content normal.        Judgment: Judgment normal.   Shoulder bandaged.    CBC:    BMET Recent Labs    12/08/20 0500 12/09/20 0414  NA 136 135  K 3.5 3.2*  CL 104 103  CO2 22 23  GLUCOSE 103* 108*  BUN 22* 16  CREATININE 1.70* 1.43*  CALCIUM 8.1* 8.3*     Liver Panel  Recent Labs    12/07/20 0500 12/08/20 0500  PROT 8.1 8.0  ALBUMIN 1.6* 1.8*  AST 20 20  ALT 19 16  ALKPHOS 65 72  BILITOT 0.5 0.4       Sedimentation Rate No results for input(s): ESRSEDRATE in the last 72 hours. C-Reactive Protein No results for input(s): CRP in the last 72 hours.  Micro Results: Recent Results (from the past 720 hour(s))  Resp Panel by RT-PCR (Flu A&B, Covid) Peripheral     Status: None   Collection Time: 11/15/20  4:10 PM   Specimen: Peripheral; Nasopharyngeal(NP) swabs in vial transport medium  Result Value Ref Range Status   SARS Coronavirus 2 by RT PCR NEGATIVE NEGATIVE Final    Comment: (NOTE) SARS-CoV-2 target nucleic acids are NOT DETECTED.  The SARS-CoV-2 RNA is generally detectable in upper respiratory specimens during the acute phase of infection. The lowest concentration of SARS-CoV-2 viral copies this assay can detect is 138 copies/mL. A negative result does not preclude SARS-Cov-2 infection and should not be used as the sole basis for treatment or other patient management decisions. A negative result may occur with  improper specimen collection/handling, submission of  specimen other than nasopharyngeal swab, presence of viral mutation(s) within the areas targeted by this assay, and inadequate number of viral copies(<138 copies/mL). A negative result must be combined with clinical observations, patient history, and epidemiological information. The expected result is Negative.  Fact Sheet for Patients:  BloggerCourse.com  Fact Sheet for Healthcare Providers:  SeriousBroker.it  This test is no t yet approved or cleared by the [place] FDA and  has been authorized for detection and/or diagnosis of SARS-CoV-2 by FDA under an Emergency Use Authorization (EUA). This EUA will remain  in effect (meaning this test can be used) for the duration of the COVID-19 declaration under Section 564(b)(1) of the Act, 21 U.S.C.section 360bbb-3(b)(1), unless the authorization is terminated  or revoked sooner.       Influenza A by PCR NEGATIVE NEGATIVE Final   Influenza B by PCR NEGATIVE NEGATIVE Final    Comment: (NOTE) The Xpert Xpress SARS-CoV-2/FLU/RSV plus assay is intended as an aid in the diagnosis of influenza from Nasopharyngeal swab specimens and should not be used as a sole basis for treatment. Nasal washings and aspirates are unacceptable for Xpert Xpress SARS-CoV-2/FLU/RSV testing.  Fact Sheet for Patients: BloggerCourse.com  Fact Sheet for Healthcare Providers: SeriousBroker.it  This test is not yet approved or cleared by the [place] FDA and has  been authorized for detection and/or diagnosis of SARS-CoV-2 by FDA under an Emergency Use Authorization (EUA). This EUA will remain in effect (meaning this test can be used) for the duration of the COVID-19 declaration under Section 564(b)(1) of the Act, 21 U.S.C. section 360bbb-3(b)(1), unless the authorization is terminated or revoked.  Performed at [facility], [address] [place], [place] [address]   Blood culture (routine x 2)     Status: Abnormal   Collection Time: 11/15/20  4:10 PM   Specimen: BLOOD  Result Value Ref Range Status   Specimen Description   Final    BLOOD LEFT ANTECUBITAL Performed at [facility], [address] [place], [place] [address]    Special Requests   Final    BOTTLES DRAWN AEROBIC AND ANAEROBIC Blood Culture adequate volume Performed at [facility], [address] [place], [place] [address]    Culture  Setup Time   Final    GRAM POSITIVE COCCI IN BOTH AEROBIC AND ANAEROBIC BOTTLES CRITICAL RESULT CALLED TO, READ BACK BY AND VERIFIED WITH: CINDY REED,RN  11/16/20 EB Performed at [facility], [address] [place], [place] [address]    Culture METHICILLIN RESISTANT STAPHYLOCOCCUS AUREUS (A)  Final   Report Status 11/18/2020 FINAL  Final   Organism ID, Bacteria METHICILLIN RESISTANT STAPHYLOCOCCUS AUREUS  Final      Susceptibility   Methicillin resistant staphylococcus aureus - MIC*    CIPROFLOXACIN <=0.5 SENSITIVE Sensitive     ERYTHROMYCIN >=8 RESISTANT Resistant     GENTAMICIN <=0.5 SENSITIVE Sensitive     OXACILLIN >=4 RESISTANT Resistant     TETRACYCLINE <=1 SENSITIVE Sensitive     VANCOMYCIN 1 SENSITIVE Sensitive     TRIMETH/SULFA <=10 SENSITIVE Sensitive     CLINDAMYCIN <=0.25 SENSITIVE Sensitive     RIFAMPIN <=0.5 SENSITIVE Sensitive     Inducible Clindamycin NEGATIVE Sensitive     * METHICILLIN RESISTANT STAPHYLOCOCCUS AUREUS  Blood Culture ID Panel (Reflexed)     Status: Abnormal   Collection Time: 11/15/20  4:10 PM  Result Value Ref Range Status   Enterococcus faecalis NOT DETECTED NOT DETECTED Final   Enterococcus Faecium NOT DETECTED NOT DETECTED Final   Listeria monocytogenes NOT DETECTED NOT DETECTED Final   Staphylococcus species DETECTED (A) NOT DETECTED Final    Comment: CRITICAL RESULT CALLED TO, READ BACK BY AND VERIFIED WITH: CINDY REED,RN  11/16/20  EB    Staphylococcus aureus (BCID) DETECTED (A) NOT DETECTED Final    Comment: Methicillin (oxacillin)-resistant Staphylococcus aureus (MRSA). MRSA is predictably resistant to beta-lactam antibiotics (except ceftaroline). Preferred therapy is vancomycin unless clinically contraindicated. Patient requires contact precautions if  hospitalized. CRITICAL RESULT CALLED TO, READ BACK BY AND VERIFIED WITH: CINDY REED,RN  11/16/20 EB    Staphylococcus epidermidis NOT DETECTED NOT DETECTED Final   Staphylococcus lugdunensis NOT DETECTED NOT DETECTED Final   Streptococcus species NOT DETECTED NOT DETECTED Final   Streptococcus agalactiae NOT DETECTED NOT DETECTED Final   Streptococcus pneumoniae NOT DETECTED NOT DETECTED Final   Streptococcus pyogenes NOT DETECTED NOT DETECTED Final   A.calcoaceticus-baumannii NOT DETECTED NOT DETECTED Final   Bacteroides fragilis NOT DETECTED NOT DETECTED Final   Enterobacterales NOT DETECTED NOT DETECTED Final   Enterobacter cloacae complex NOT DETECTED NOT DETECTED Final   Escherichia coli NOT DETECTED NOT DETECTED Final   Klebsiella aerogenes NOT DETECTED NOT DETECTED Final   Klebsiella oxytoca NOT DETECTED NOT DETECTED Final  Klebsiella pneumoniae NOT DETECTED NOT DETECTED Final   Proteus species NOT DETECTED NOT DETECTED Final   Salmonella species NOT DETECTED NOT DETECTED Final   Serratia marcescens NOT DETECTED NOT DETECTED Final   Haemophilus influenzae NOT DETECTED NOT DETECTED Final   Neisseria meningitidis NOT DETECTED NOT DETECTED Final   Pseudomonas aeruginosa NOT DETECTED NOT DETECTED Final   Stenotrophomonas maltophilia NOT DETECTED NOT DETECTED Final   Candida albicans NOT DETECTED NOT DETECTED Final   Candida auris NOT DETECTED NOT DETECTED Final   Candida glabrata NOT DETECTED NOT DETECTED Final   Candida krusei NOT DETECTED NOT DETECTED Final   Candida parapsilosis NOT DETECTED NOT DETECTED Final   Candida tropicalis NOT DETECTED NOT  DETECTED Final   Cryptococcus neoformans/gattii NOT DETECTED NOT DETECTED Final   Meth resistant mecA/C and MREJ DETECTED (A) NOT DETECTED Final    Comment: CRITICAL RESULT CALLED TO, READ BACK BY AND VERIFIED WITH: CINDY REED,RN  11/16/20 EB Performed at [facility], [address], [place], [place] [address]   Blood culture (routine x 2)     Status: Abnormal   Collection Time: 11/15/20  4:25 PM   Specimen: BLOOD  Result Value Ref Range Status   Specimen Description   Final    BLOOD LEFT ANTECUBITAL Performed at [facility], [address] [place], [place] [address]    Special Requests   Final    BOTTLES DRAWN AEROBIC ONLY Blood Culture results may not be optimal due to an inadequate volume of blood received in culture bottles Performed at [facility], [address] [place], [place] [address]    Culture  Setup Time   Final    GRAM POSITIVE COCCI IN CLUSTERS AEROBIC BOTTLE ONLY CRITICAL VALUE NOTED.  VALUE IS CONSISTENT WITH PREVIOUSLY REPORTED AND CALLED VALUE.    Culture (A)  Final    STAPHYLOCOCCUS AUREUS SUSCEPTIBILITIES PERFORMED ON PREVIOUS CULTURE WITHIN THE LAST 5 DAYS. Performed at [facility], [address] [place], [place] [address]    Report Status 11/18/2020 FINAL  Final  Urine culture     Status: Abnormal   Collection Time: 11/15/20  7:47 PM   Specimen: Urine, Catheterized  Result Value Ref Range Status   Specimen Description   Final    URINE, CATHETERIZED Performed at [facility], [address] [place], [place] [address]    Special Requests   Final    Normal Performed at [facility], [address] [place], [place] [address]    Culture (A)  Final    >=100,000 COLONIES/mL METHICILLIN RESISTANT STAPHYLOCOCCUS AUREUS   Report Status 11/18/2020 FINAL  Final   Organism ID, Bacteria METHICILLIN RESISTANT STAPHYLOCOCCUS AUREUS (A)  Final      Susceptibility   Methicillin resistant  staphylococcus aureus - MIC*    CIPROFLOXACIN <=0.5 SENSITIVE Sensitive     GENTAMICIN <=0.5 SENSITIVE Sensitive     NITROFURANTOIN <=16 SENSITIVE Sensitive     OXACILLIN >=4 RESISTANT Resistant     TETRACYCLINE <=1 SENSITIVE Sensitive     VANCOMYCIN <=0.5 SENSITIVE Sensitive     TRIMETH/SULFA <=10 SENSITIVE Sensitive     CLINDAMYCIN <=0.25 SENSITIVE Sensitive     RIFAMPIN <=0.5 SENSITIVE Sensitive     Inducible Clindamycin NEGATIVE Sensitive     * >=100,000 COLONIES/mL METHICILLIN RESISTANT STAPHYLOCOCCUS AUREUS  Culture, blood (single)     Status: Abnormal   Collection Time: 11/15/20  9:35 PM  Specimen: BLOOD RIGHT HAND  Result Value Ref Range Status   Specimen Description   Final    BLOOD RIGHT HAND Performed at [facility], [address] [place], [place] [address]    Special Requests   Final    BOTTLES DRAWN AEROBIC AND ANAEROBIC Blood Culture adequate volume Performed at [facility], [address] [place], [place] [address]    Culture  Setup Time   Final    GRAM POSITIVE COCCI IN CLUSTERS IN BOTH AEROBIC AND ANAEROBIC BOTTLES CRITICAL VALUE NOTED.  VALUE IS CONSISTENT WITH PREVIOUSLY REPORTED AND CALLED VALUE.    Culture (A)  Final    STAPHYLOCOCCUS AUREUS SUSCEPTIBILITIES PERFORMED ON PREVIOUS CULTURE WITHIN THE LAST 5 DAYS. Performed at [facility], [address], [place], [place] [address]    Report Status 11/20/2020 FINAL  Final  Culture, blood (routine x 2)     Status: None   Collection Time: 11/17/20  9:28 AM   Specimen: BLOOD RIGHT FOREARM  Result Value Ref Range Status   Specimen Description BLOOD RIGHT FOREARM  Final   Special Requests   Final    BOTTLES DRAWN AEROBIC AND ANAEROBIC Blood Culture results may not be optimal due to an inadequate volume of blood received in culture bottles   Culture   Final    NO GROWTH 5 DAYS Performed at [facility], [address] [place], [place] [address]    Report Status  11/22/2020 FINAL  Final  Culture, blood (routine x 2)     Status: Abnormal   Collection Time: 11/17/20  9:39 AM   Specimen: BLOOD RIGHT HAND  Result Value Ref Range Status   Specimen Description BLOOD RIGHT HAND  Final   Special Requests   Final    BOTTLES DRAWN AEROBIC AND ANAEROBIC Blood Culture results may not be optimal due to an inadequate volume of blood received in culture bottles   Culture  Setup Time   Final    GRAM POSITIVE COCCI IN CLUSTERS ANAEROBIC BOTTLE ONLY Organism ID to follow CRITICAL RESULT CALLED TO, READ BACK BY AND VERIFIED WITHThresa Ross PHARMD, AT 2956 11/20/20 Renato Shin Performed at [facility], [address] [place], [place] [address]    Culture METHICILLIN RESISTANT STAPHYLOCOCCUS AUREUS (A)  Final   Report Status 11/22/2020 FINAL  Final   Organism ID, Bacteria METHICILLIN RESISTANT STAPHYLOCOCCUS AUREUS  Final      Susceptibility   Methicillin resistant staphylococcus aureus - MIC*    CIPROFLOXACIN <=0.5 SENSITIVE Sensitive     ERYTHROMYCIN >=8 RESISTANT Resistant     GENTAMICIN <=0.5 SENSITIVE Sensitive     OXACILLIN >=4 RESISTANT Resistant     TETRACYCLINE <=1 SENSITIVE Sensitive     VANCOMYCIN 1 SENSITIVE Sensitive     TRIMETH/SULFA <=10 SENSITIVE Sensitive     CLINDAMYCIN <=0.25 SENSITIVE Sensitive     RIFAMPIN <=0.5 SENSITIVE Sensitive     Inducible Clindamycin NEGATIVE Sensitive     * METHICILLIN RESISTANT STAPHYLOCOCCUS AUREUS  Blood Culture ID Panel (Reflexed)     Status: Abnormal   Collection Time: 11/17/20  9:39 AM  Result Value Ref Range Status   Enterococcus faecalis NOT DETECTED NOT DETECTED Final   Enterococcus Faecium NOT DETECTED NOT DETECTED Final   Listeria monocytogenes NOT DETECTED NOT DETECTED Final   Staphylococcus species DETECTED (A) NOT DETECTED Final    Comment: CRITICAL RESULT CALLED TO, READ BACK BY AND VERIFIED WITH:  Hubert Azure DOHLEN PHARMD, AT 9892 11/20/20 D. VANHOOK    Staphylococcus aureus (BCID) DETECTED  (A) NOT DETECTED Final    Comment: Methicillin (oxacillin)-resistant Staphylococcus aureus (MRSA). MRSA is predictably resistant to beta-lactam antibiotics (except ceftaroline). Preferred therapy is vancomycin unless clinically contraindicated. Patient requires contact precautions if  hospitalized. CRITICAL RESULT CALLED TO, READ BACK BY AND VERIFIED WITHThresa Ross PHARMD, AT 1194 11/20/20 D. VANHOOK    Staphylococcus epidermidis NOT DETECTED NOT DETECTED Final   Staphylococcus lugdunensis NOT DETECTED NOT DETECTED Final   Streptococcus species NOT DETECTED NOT DETECTED Final   Streptococcus agalactiae NOT DETECTED NOT DETECTED Final   Streptococcus pneumoniae NOT DETECTED NOT DETECTED Final   Streptococcus pyogenes NOT DETECTED NOT DETECTED Final   A.calcoaceticus-baumannii NOT DETECTED NOT DETECTED Final   Bacteroides fragilis NOT DETECTED NOT DETECTED Final   Enterobacterales NOT DETECTED NOT DETECTED Final   Enterobacter cloacae complex NOT DETECTED NOT DETECTED Final   Escherichia coli NOT DETECTED NOT DETECTED Final   Klebsiella aerogenes NOT DETECTED NOT DETECTED Final   Klebsiella oxytoca NOT DETECTED NOT DETECTED Final   Klebsiella pneumoniae NOT DETECTED NOT DETECTED Final   Proteus species NOT DETECTED NOT DETECTED Final   Salmonella species NOT DETECTED NOT DETECTED Final   Serratia marcescens NOT DETECTED NOT DETECTED Final   Haemophilus influenzae NOT DETECTED NOT DETECTED Final   Neisseria meningitidis NOT DETECTED NOT DETECTED Final   Pseudomonas aeruginosa NOT DETECTED NOT DETECTED Final   Stenotrophomonas maltophilia NOT DETECTED NOT DETECTED Final   Candida albicans NOT DETECTED NOT DETECTED Final   Candida auris NOT DETECTED NOT DETECTED Final   Candida glabrata NOT DETECTED NOT DETECTED Final   Candida krusei NOT DETECTED NOT DETECTED Final   Candida parapsilosis NOT DETECTED NOT DETECTED Final   Candida tropicalis NOT DETECTED NOT DETECTED Final    Cryptococcus neoformans/gattii NOT DETECTED NOT DETECTED Final   Meth resistant mecA/C and MREJ DETECTED (A) NOT DETECTED Final    Comment: CRITICAL RESULT CALLED TO, READ BACK BY AND VERIFIED WITHThresa Ross PHARMD, AT 1740 11/20/20 Renato Shin Performed at [facility], [address], [place], [place] [address]   MRSA PCR Screening     Status: Abnormal   Collection Time: 11/17/20 11:38 AM   Specimen: Nasopharyngeal  Result Value Ref Range Status   MRSA by PCR POSITIVE (A) NEGATIVE Final    Comment:        The GeneXpert MRSA Assay (FDA approved for NASAL specimens only), is one component of a comprehensive MRSA colonization surveillance program. It is not intended to diagnose MRSA infection nor to guide or monitor treatment for MRSA infections. RESULT CALLED TO, READ BACK BY AND VERIFIED WITH: R ROBERTS RN 1400 11/17/20 A BROWNING Performed at [facility], [address] [place], [place] [address]   Body fluid culture w Gram Stain     Status: None   Collection Time: 11/17/20  4:05 PM   Specimen: Synovium; Body Fluid  Result Value Ref Range Status   Specimen Description SYNOVIAL RIGHT SHOULDER  Final   Special Requests NONE  Final   Gram Stain   Final    ABUNDANT WBC PRESENT, PREDOMINANTLY PMN NO ORGANISMS SEEN    Culture   Final    RARE METHICILLIN RESISTANT STAPHYLOCOCCUS AUREUS CRITICAL RESULT CALLED TO, READ BACK BY AND VERIFIED WITH: RN T.SHELTON AT 1211 ON 11/19/2020 BY T.SAAD Performed at [facility], [address] [place], [place]  [address]    Report Status 11/21/2020 FINAL  Final   Organism ID, Bacteria METHICILLIN RESISTANT STAPHYLOCOCCUS AUREUS  Final      Susceptibility   Methicillin resistant staphylococcus aureus - MIC*    CIPROFLOXACIN <=0.5 SENSITIVE Sensitive     ERYTHROMYCIN >=8 RESISTANT Resistant     GENTAMICIN <=0.5 SENSITIVE Sensitive     OXACILLIN >=4 RESISTANT Resistant     TETRACYCLINE <=1 SENSITIVE Sensitive      VANCOMYCIN <=0.5 SENSITIVE Sensitive     TRIMETH/SULFA <=10 SENSITIVE Sensitive     CLINDAMYCIN <=0.25 SENSITIVE Sensitive     RIFAMPIN <=0.5 SENSITIVE Sensitive     Inducible Clindamycin NEGATIVE Sensitive     * RARE METHICILLIN RESISTANT STAPHYLOCOCCUS AUREUS  Culture, blood (Routine X 2) w Reflex to ID Panel     Status: None   Collection Time: 11/21/20  5:26 AM   Specimen: BLOOD RIGHT HAND  Result Value Ref Range Status   Specimen Description BLOOD RIGHT HAND  Final   Special Requests   Final    BOTTLES DRAWN AEROBIC ONLY Blood Culture adequate volume   Culture   Final    NO GROWTH 5 DAYS Performed at [facility], [address], [place], [place] [address]    Report Status 11/26/2020 FINAL  Final  Culture, blood (Routine X 2) w Reflex to ID Panel     Status: None   Collection Time: 11/21/20  5:26 AM   Specimen: BLOOD LEFT HAND  Result Value Ref Range Status   Specimen Description BLOOD LEFT HAND  Final   Special Requests   Final    BOTTLES DRAWN AEROBIC ONLY Blood Culture adequate volume   Culture   Final    NO GROWTH 5 DAYS Performed at [facility], [address] [place], [place] [address]    Report Status 11/26/2020 FINAL  Final  Aerobic/Anaerobic Culture w Gram Stain (surgical/deep wound)     Status: None (Preliminary result)   Collection Time: 12/06/20  8:39 AM   Specimen: PATH Other; Body Fluid  Result Value Ref Range Status   Specimen Description ABSCESS RIGHT SHOULDER ABSCESS SPEC A  Final   Special Requests RIGHT SHOULDER ABSCESS  Final   Gram Stain   Final    FEW WBC PRESENT, PREDOMINANTLY PMN RARE GRAM POSITIVE COCCI CORRECTED RESULTS PREVIOUSLY REPORTED AS: RARE YEAST CORRECTED RESULTS CALLED TO: TYLER BAUMEISTER PHARMD @1010  12/09/20 EB    Culture   Final    NO GROWTH 3 DAYS NO ANAEROBES ISOLATED; CULTURE IN PROGRESS FOR 5 DAYS Performed at [facility], [address] [place], [place]    Report Status PENDING  Incomplete     Studies/Results: No results found.    Assessment/Plan:  INTERVAL HISTORY: She has had orthopedic surgery on Saturday.  Shoulder culture read as showing yeast but not growing and multiple microbiology techs have reviewed and believe this is not a yeast but more likely a staph species     Principal Problem:   Severe sepsis due to methicillin resistant Staphylococcus aureus (MRSA) with acute organ dysfunction (HCC) Active Problems:   IV drug abuse (HCC)   Poor dentition   Malnutrition (HCC)   Thrombocytopenia (HCC)   Tobacco dependence   Acute cystitis without hematuria   Malnutrition of moderate degree   Abdominal pain   Left foot pain   Septic embolism (HCC)   Endocarditis of tricuspid valve   Staphylococcal arthritis of right shoulder (HCC)  MRSA bacteremia   Acute on chronic respiratory failure with hypoxia (HCC)   Fungal infection    Anne Bautista is a 35 y.o. female with IV drug use MRSA bacteremia tricuspid had about tricuspid valve endocarditis with septic emboli to the lungs right septic shoulder status post orthopedic surgery which disclosed all to pull subdeltoid abscesses within the right side.  She has had I&D.  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

## 2020-12-10 ENCOUNTER — Inpatient Hospital Stay (HOSPITAL_COMMUNITY): Payer: Self-pay

## 2020-12-10 DIAGNOSIS — A419 Sepsis, unspecified organism: Secondary | ICD-10-CM

## 2020-12-10 LAB — GLUCOSE, CAPILLARY: Glucose-Capillary: 101 mg/dL — ABNORMAL HIGH (ref 70–99)

## 2020-12-10 LAB — BRAIN NATRIURETIC PEPTIDE: B Natriuretic Peptide: 458.5 pg/mL — ABNORMAL HIGH (ref 0.0–100.0)

## 2020-12-10 MED ORDER — ACETAMINOPHEN 650 MG RE SUPP: 650.0000 mg | RECTAL | Status: DC | PRN

## 2020-12-10 MED ORDER — ACETAMINOPHEN 325 MG PO TABS
650.0000 mg | ORAL_TABLET | ORAL | Status: DC | PRN
  Administered 2020-12-10 – 2020-12-27 (×14): 650 mg via ORAL
  Filled 2020-12-10 (×15): qty 2

## 2020-12-10 NOTE — Progress Notes (Addendum)
[facility] PROGRESS NOTE  Anne Bautista XNA:[id number] DOB: 1986/02/19 DOA: 11/15/2020 PCP: Patient, No Pcp Per  Status:  Remains inpatient appropriate because:Unsafe d/c plan and IV treatments appropriate due to intensity of illness or inability to take PO   Dispo: The patient is from: Home              Anticipated d/c is to: SNF recommended by PT due to physical debility              Barriers to discharge: Lack of funding-currently Medicaid potential, history of substance abuse.  Of note prior to admission patient was residing at the [facility].  Patient reports she will live with her mother in [place] after discharge.              Patient currently is not medically stable to d/c.  Patient will remain on IV daptomycin until 4/18.  We also need clarification regarding possibility of valvular angio back surgery during hospitalization   Difficult to place patient No   Level of care: Progressive  Code Status: Full Family Communication:  DVT prophylaxis: SQ Heparin Vaccination status: Unknown  Foley catheter: No  HPI: 35 year old female with history of IV drug use, tobacco use, depression came to the hospital with myalgias and cough, he was septic on arrival admitted to the ICU.  She was found to have MRSA bacteremia, tricuspid valve endocarditis as well as septic emboli to lungs, kidneys.  Infectious disease and cardiothoracic surgery consulted.  Hospital course complicated by worsening right shoulder pain found to have septic arthritis, with cultures showing yeast   Subjective: Up in chair. No urinary tract infection symptoms. Would like to ambulate outside of room w/ staff if allowed  Objective: Vitals:   12/10/20 0335 12/10/20 0721  BP: (!) 149/91 (!) 146/86  Pulse: 99 (!) 103  Resp: 19 18  Temp: 99 F (37.2 C) 99.3 F (37.4 C)  SpO2: 99% 96%    Intake/Output Summary (Last 24 hours) at 12/10/2020 0813 Last data filed at 12/10/2020  0600 Gross per 24 hour  Intake 715 ml  Output 1300 ml  Net -585 ml   Filed Weights   11/30/20 0500 12/01/20 0328 12/02/20 0300  Weight: 77.6 kg 74 kg 75.2 kg    Exam:  Constitutional: NAD, calm, comfortable Respiratory: clear to auscultation bilaterally, no wheezing, no crackles. Normal respiratory effort. No accessory muscle use.  Cardiovascular: Regular rate and rhythm, grade 3/5 systolic murmur left sternal border fourth intercostal space. No extremity edema. 2+ pedal pulses.  Abdomen: no tenderness, no masses palpated. Bowel sounds positive.  Musculoskeletal: no clubbing / cyanosis. No joint deformity upper and lower extremities. Good ROM, no contractures. Normal muscle tone.  Skin: no rashes, lesions, ulcers. No induration Neurologic: CN 2-12 grossly intact. Sensation intact, DTR normal. Strength 5/5 x all 4 extremities.  Psychiatric: Normal judgment and insight. Alert and oriented x 3. Normal mood.    Assessment/Plan: Acute problems: Severe sepsis due to MRSA bacteremia -Complicated by septic emboli to both lungs and kidneys, right shoulder possibly septic left ankle (MRI refused by patient).  She also has a right subdeltoid abscess. Left ankle improving, no further issues, no pain -ID, cardiothoracic surgery, orthopedic surgery following -Status post incision and drainage of right shoulder subdeltoid abscess with multiple cavities on 3/5 by Dr. Susa Simmonds. Drain in place, removal per ortho  -Continue daptomycin until 01/19/2021, monitor CK. Shoulder cultures revealed yeast so started on anidulafungin but culture result  corrected as rare gram-positive cocci so antifungal medication discontinued-final cultures pending -Last blood cultures negative on 2/18, has a PICC line since 2/28. -Cardiothoracic surgery evaluated patient early in the hospitalization Oralseptic for possible angio VAC debridement.  On 3/9 I have sent a secure chat to Dr. Cliffton AstersLightfoot to determine if they still wish to  proceed with surgical intervention during this hospitalization.  Fever -Has been having myalgias for several days and last night (3/9) spiked temp 101 -CXR neg and blood cultures pending -will check UA/cx -asymptomatic   IV drug use w/ Heroin, cocaine use, marijuana, tobacco  -cessation was encouraged.   -UDS was positive for cocaine.   -Hep C and HIV are negative.  RPR is negative. -Is on methadone and she would like to continue this after discharge.  Did not wish to transition to Suboxone after discharge.  Acute hypoxic respiratory failure, narcotic withdrawal, bilateral pulmonary septic emboli  -hypoxia resolved, remains on room air   Acute blood loss anemia, iron deficiency anemia  -post operatively, received a total of 2U pRBC, one on 3/6 and one on 3/7  Nonoliguric AKI with ATN  -creatinine peaked at 2.8, fluctuating but overall stable, 1.4 today   Debility/deconditioning PT  NOTE 3/8: Pt with better color and energy level today than yesterday. Resistant to getting up at first but easily encouraged. Pt worked on sitting and standing balance exercises with balloon that therapist brought. Pt then agreeable to ambulation out of room. With use of RW pt able to ambulate 5080' with min-guard A. C/o B ankle pain by end of ambulation and discussed ROM and strengthening for B ankles. PT will continue to follow. OT NOTE 3/8: pt reports pain from HA declining OT session, will check back as time allows.  Moderate protein calorie malnutrition Nutrition Problem: Moderate Malnutrition Etiology: social / environmental circumstances (IVDU) Signs/Symptoms: mild fat depletion,moderate fat depletion,mild muscle depletion,moderate muscle depletion Interventions: Ensure Enlive (each supplement provides 350kcal and 20 grams of protein),MVI Estimated body mass index is 25.97 kg/m as calculated from the following:   Height as of this encounter: 5\' 7"  (1.702 m).   Weight as of this encounter: 75.2  kg.   Wounds: Incision (Closed) 12/06/20 Shoulder Right (Active)  Date First Assessed/Time First Assessed: 12/06/20 0914   Location: Shoulder  Location Orientation: Right    Assessments 12/06/2020  8:56 AM 12/09/2020  8:00 PM  Dressing Type Adhesive bandage Foam - Lift dressing to assess site every shift  Dressing Clean;Dry;Intact --  Site / Wound Assessment Dressing in place / Unable to assess --  Drainage Amount None --     No Linked orders to display     Other problems: Hypertension -continue current regimen  Acute cystitis without hematuria  -MRSA in cultures, she is on antibiotics  Data Reviewed: Basic Metabolic Panel: Recent Labs  Lab 12/07/20 0500 12/08/20 0500 12/09/20 0414  NA 136 136 135  K 4.0 3.5 3.2*  CL 103 104 103  CO2 23 22 23   GLUCOSE 107* 103* 108*  BUN 33* 22* 16  CREATININE 1.96* 1.70* 1.43*  CALCIUM 8.1* 8.1* 8.3*  MG  --  1.8  --   PHOS  --  3.7  --    Liver Function Tests: Recent Labs  Lab 12/07/20 0500 12/08/20 0500  AST 20 20  ALT 19 16  ALKPHOS 65 72  BILITOT 0.5 0.4  PROT 8.1 8.0  ALBUMIN 1.6* 1.8*   No results for input(s): LIPASE, AMYLASE in the last 168  hours. No results for input(s): AMMONIA in the last 168 hours. CBC: Recent Labs  Lab 12/07/20 0500 12/07/20 1812 12/08/20 0500 12/09/20 0414  WBC 8.6  --  11.2* 16.3*  HGB 6.0* 7.1* 6.6* 8.0*  HCT 19.8* 22.6* 20.8* 24.2*  MCV 85.0  --  83.5 83.4  PLT 288  --  315 338   Cardiac Enzymes: Recent Labs  Lab 12/07/20 0500  CKTOTAL 47   BNP (last 3 results) Recent Labs    11/26/20 0419 11/27/20 0430 11/28/20 0505  BNP 506.5* 670.1* 455.5*    ProBNP (last 3 results) No results for input(s): PROBNP in the last 8760 hours.  CBG: Recent Labs  Lab 12/09/20 0729 12/09/20 1216 12/09/20 1752 12/09/20 2114 12/10/20 0609  GLUCAP 132* 113* 87 107* 101*    Recent Results (from the past 240 hour(s))  Aerobic/Anaerobic Culture w Gram Stain (surgical/deep wound)      Status: None (Preliminary result)   Collection Time: 12/06/20  8:39 AM   Specimen: PATH Other; Body Fluid  Result Value Ref Range Status   Specimen Description ABSCESS RIGHT SHOULDER ABSCESS SPEC A  Final   Special Requests RIGHT SHOULDER ABSCESS  Final   Gram Stain   Final    FEW WBC PRESENT, PREDOMINANTLY PMN RARE GRAM POSITIVE COCCI CORRECTED RESULTS PREVIOUSLY REPORTED AS: RARE YEAST CORRECTED RESULTS CALLED TO: TYLER BAUMEISTER PHARMD @1010  12/09/20 EB    Culture   Final    NO GROWTH 3 DAYS NO ANAEROBES ISOLATED; CULTURE IN PROGRESS FOR 5 DAYS Performed at [facility], [address], [place], [place]    Report Status PENDING  Incomplete     Studies: No results found.  Scheduled Meds: . amLODipine  10 mg Oral Daily  . chlorhexidine  15 mL Mouth Rinse BID  . Chlorhexidine Gluconate Cloth  6 each Topical Daily  . cloNIDine  0.1 mg Oral BID  . dicyclomine  20 mg Oral TID AC & HS  . docusate sodium  100 mg Oral BID  . feeding supplement  237 mL Oral QID  . folic acid  1 mg Oral Daily  . heparin  5,000 Units Subcutaneous Q8H  . mouth rinse  15 mL Mouth Rinse q12n4p  . methadone  10 mg Oral Q8H  . multivitamin with minerals  1 tablet Oral Daily  . pantoprazole  40 mg Oral Daily  . polyethylene glycol  17 g Oral BID  . QUEtiapine  25 mg Oral BID  . senna  2 tablet Oral Daily  . sodium chloride flush  10-40 mL Intracatheter Q12H  . thiamine  100 mg Oral Daily   Continuous Infusions: . sodium chloride Stopped (12/05/20 0656)  . DAPTOmycin (CUBICIN)  IV Stopped (12/09/20 2037)  . lactated ringers 10 mL/hr at 12/06/20 0730    Principal Problem:   Severe sepsis due to methicillin resistant Staphylococcus aureus (MRSA) with acute organ dysfunction (HCC) Active Problems:   IV drug abuse (HCC)   Poor dentition   Malnutrition (HCC)   Thrombocytopenia (HCC)   Tobacco dependence   Acute cystitis without hematuria   Malnutrition of moderate degree    Abdominal pain   Left foot pain   Septic embolism (HCC)   Endocarditis of tricuspid valve   Staphylococcal arthritis of right shoulder (HCC)   MRSA bacteremia   Acute on chronic respiratory failure with hypoxia (HCC)   Fungal infection   Consultants: Infectious disease Cardiothoracic surgery Orthopedic surgery  Procedures:  2D echocardiogram  Anesthesia on 3/3  Irrigation and debridement right shoulder abscess on 3/5  Antibiotics: Anti-infectives (From admission, onward)   Start     Dose/Rate Route Frequency Ordered Stop   12/09/20 1200  anidulafungin (ERAXIS) 100 mg in sodium chloride 0.9 % 100 mL IVPB  Status:  Discontinued        100 mg 78 mL/hr over 100 Minutes Intravenous Every 24 hours 12/08/20 1109 12/09/20 1013   12/08/20 1200  anidulafungin (ERAXIS) 200 mg in sodium chloride 0.9 % 200 mL IVPB        200 mg 78 mL/hr over 200 Minutes Intravenous  Once 12/08/20 1109 12/08/20 1724   12/06/20 0833  vancomycin (VANCOCIN) powder  Status:  Discontinued          As needed 12/06/20 0834 12/06/20 0852   12/06/20 0600  ceFAZolin (ANCEF) IVPB 2g/100 mL premix        2 g 200 mL/hr over 30 Minutes Intravenous On call to O.R. 12/06/20 0306 12/06/20 0755   11/28/20 2000  DAPTOmycin (CUBICIN) 764 mg in sodium chloride 0.9 % IVPB        10 mg/kg  76.4 kg 230.6 mL/hr over 30 Minutes Intravenous Daily 11/28/20 0727     11/24/20 2000  DAPTOmycin (CUBICIN) 827 mg in sodium chloride 0.9 % IVPB  Status:  Discontinued        10 mg/kg  82.7 kg 233.1 mL/hr over 30 Minutes Intravenous Daily 11/24/20 0821 11/28/20 0727   11/19/20 1000  DAPTOmycin (CUBICIN) 770 mg in sodium chloride 0.9 % IVPB  Status:  Discontinued        10 mg/kg  77 kg 230.8 mL/hr over 30 Minutes Intravenous Daily 11/19/20 0908 11/24/20 0821   11/19/20 1000  doxycycline (VIBRA-TABS) tablet 100 mg        100 mg Oral Every 12 hours 11/19/20 0908 11/23/20 2123   11/19/20 0439  vancomycin variable dose per unstable renal  function (pharmacist dosing)  Status:  Discontinued         Does not apply See admin instructions 11/19/20 0439 11/19/20 0853   11/16/20 2000  vancomycin (VANCOREADY) IVPB 1250 mg/250 mL  Status:  Discontinued        1,250 mg 166.7 mL/hr over 90 Minutes Intravenous Every 24 hours 11/15/20 1933 11/16/20 1108   11/16/20 1200  vancomycin (VANCOCIN) IVPB 1000 mg/200 mL premix  Status:  Discontinued        1,000 mg 200 mL/hr over 60 Minutes Intravenous Every 12 hours 11/16/20 1108 11/19/20 0439   11/16/20 0745  metroNIDAZOLE (FLAGYL) IVPB 500 mg        500 mg 100 mL/hr over 60 Minutes Intravenous  Once 11/16/20 0741 11/16/20 0907   11/15/20 2237  vancomycin (VANCOCIN) 500 MG powder       Note to Pharmacy: Ihor Dow   : cabinet override      11/15/20 2237 11/16/20 1044   11/15/20 2000  ceFEPIme (MAXIPIME) 2 g in sodium chloride 0.9 % 100 mL IVPB  Status:  Discontinued        2 g 200 mL/hr over 30 Minutes Intravenous Every 12 hours 11/15/20 1933 11/16/20 1212   11/15/20 1945  vancomycin (VANCOREADY) IVPB 1500 mg/300 mL  Status:  Discontinued        1,500 mg 150 mL/hr over 120 Minutes Intravenous  Once 11/15/20 1933 11/15/20 1939   11/15/20 1945  vancomycin (VANCOCIN) IVPB 1000 mg/200 mL premix       "And" Linked Group Details  1,000 mg 200 mL/hr over 60 Minutes Intravenous  Once 11/15/20 1939 11/15/20 2231   11/15/20 1945  vancomycin (VANCOREADY) IVPB 500 mg/100 mL       "And" Linked Group Details   500 mg 100 mL/hr over 60 Minutes Intravenous  Once 11/15/20 1939 11/16/20 0002        Time spent: 30 minutes    Junious Silk ANP  [facility] 7 am - 330 pm/M-F for direct patient care and secure chat Please refer to Amion for contact info 24  days

## 2020-12-10 NOTE — Progress Notes (Signed)
[facility]  Date of Admission:  11/15/2020     Total days of antibiotics 25         ASSESSMENT:  Anne Bautista has improved motion of her right shoulder with JP drain in place with serosanguanous drainage present. Surgical specimens continue without growth and no anaerobes isolated. Continue wound care per orthopedics. CK level of 47 on 3/6 and will continue to monitor while on Daptomycin. Continue daptomycin with plan for at least 6 weeks of therapy from 3/5. Will likely need to remain hospitalized for majority of treatment with consideration for alternatives near the end of treatment prior to 4/18.   PLAN:  1. Continue Daptomycin through 4/18. 2. Monitor CK levels for therapeutic drug monitoring.  3. JP drain and wound management per orthopedics 4. Pain management and opioid use disorder per primary team.    Principal Problem:   Severe sepsis due to methicillin resistant Staphylococcus aureus (MRSA) with acute organ dysfunction (HCC) Active Problems:   Poor dentition   Malnutrition (HCC)   Thrombocytopenia (HCC)   Tobacco dependence   Acute cystitis without hematuria   Malnutrition of moderate degree   Abdominal pain   Left foot pain   Septic embolism (HCC)   Endocarditis of tricuspid valve   Staphylococcal arthritis of right shoulder (HCC)   MRSA bacteremia   Acute on chronic respiratory failure with hypoxia (HCC)   . amLODipine  10 mg Oral Daily  . chlorhexidine  15 mL Mouth Rinse BID  . Chlorhexidine Gluconate Cloth  6 each Topical Daily  . cloNIDine  0.1 mg Oral BID  . dicyclomine  20 mg Oral TID AC & HS  . docusate sodium  100 mg Oral BID  . feeding supplement  237 mL Oral QID  . folic acid  1 mg Oral Daily  . heparin  5,000 Units Subcutaneous Q8H  . mouth rinse  15 mL Mouth Rinse q12n4p  . methadone  10 mg Oral Q8H  . multivitamin with minerals  1 tablet Oral Daily  . pantoprazole  40 mg Oral Daily  . polyethylene glycol  17 g Oral  BID  . QUEtiapine  25 mg Oral BID  . senna  2 tablet Oral Daily  . sodium chloride flush  10-40 mL Intracatheter Q12H  . thiamine  100 mg Oral Daily    SUBJECTIVE:  Afebrile overnight with no acute events. Able to move right shoulder more with continued discomfort. JP drain remains in place.   No Known Allergies   Review of Systems: Review of Systems  Constitutional: Negative for chills, fever and weight loss.  Respiratory: Negative for cough, shortness of breath and wheezing.   Cardiovascular: Negative for chest pain and leg swelling.  Gastrointestinal: Negative for abdominal pain, constipation, diarrhea, nausea and vomiting.  Musculoskeletal:       Right Shoulder pain  Skin: Negative for rash.      OBJECTIVE: Vitals:   12/09/20 1957 12/09/20 2336 12/10/20 0335 12/10/20 0721  BP: (!) 172/106 (!) 139/92 (!) 149/91 (!) 146/86  Pulse: (!) 132 (!) 103 99 (!) 103  Resp: 20 19 19 18   Temp: 98.6 F (37 C) 98.9 F (37.2 C) 99 F (37.2 C) 99.3 F (37.4 C)  TempSrc: Oral Oral Oral Oral  SpO2: 100% 97% 99% 96%  Weight:      Height:       Body mass index is 25.97 kg/m.  Physical Exam Constitutional:      General:  She is not in acute distress.    Appearance: She is well-developed and well-nourished.     Comments: Seated on the side of the bed; pleasant.   Cardiovascular:     Rate and Rhythm: Normal rate and regular rhythm.     Pulses: Intact distal pulses.     Heart sounds: Normal heart sounds.  Pulmonary:     Effort: Pulmonary effort is normal.     Breath sounds: Normal breath sounds.  Musculoskeletal:     Comments: JP drain with serosanguinous fluid.   Skin:    General: Skin is warm and dry.  Neurological:     Mental Status: She is alert and oriented to person, place, and time.  Psychiatric:        Mood and Affect: Mood and affect normal.        Behavior: Behavior normal.        Thought Content: Thought content normal.        Judgment: Judgment normal.      Lab Results Lab Results  Component Value Date   WBC 16.3 (H) 12/09/2020   HGB 8.0 (L) 12/09/2020   HCT 24.2 (L) 12/09/2020   MCV 83.4 12/09/2020   PLT 338 12/09/2020    Lab Results  Component Value Date   CREATININE 1.43 (H) 12/09/2020   BUN 16 12/09/2020   NA 135 12/09/2020   K 3.2 (L) 12/09/2020   CL 103 12/09/2020   CO2 23 12/09/2020    Lab Results  Component Value Date   ALT 16 12/08/2020   AST 20 12/08/2020   ALKPHOS 72 12/08/2020   BILITOT 0.4 12/08/2020     Microbiology: Recent Results (from the past 240 hour(s))  Aerobic/Anaerobic Culture w Gram Stain (surgical/deep wound)     Status: None (Preliminary result)   Collection Time: 12/06/20  8:39 AM   Specimen: PATH Other; Body Fluid  Result Value Ref Range Status   Specimen Description ABSCESS RIGHT SHOULDER ABSCESS SPEC A  Final   Special Requests RIGHT SHOULDER ABSCESS  Final   Gram Stain   Final    FEW WBC PRESENT, PREDOMINANTLY PMN RARE GRAM POSITIVE COCCI CORRECTED RESULTS PREVIOUSLY REPORTED AS: RARE YEAST CORRECTED RESULTS CALLED TO: TYLER BAUMEISTER PHARMD @1010  12/09/20 EB    Culture   Final    NO GROWTH 3 DAYS NO ANAEROBES ISOLATED; CULTURE IN PROGRESS FOR 5 DAYS Performed at [facility], [address], [place], [place]    Report Status PENDING  Incomplete     [id number], NP [facility]  12/10/2020  10:44 AM

## 2020-12-10 NOTE — TOC Initial Note (Signed)
Transition of Care [facility]) - Initial/Assessment Note    Patient Details  Name: Anne Bautista MRN: [id number] Date of Birth: March 23, 1986  Transition of Care [facility]) CM/SW Contact:    Curlene Labrum, RN Phone Number: 12/10/2020, 12:10 PM  Clinical Narrative:                 Case management met with the patient at the bedside in regards to transitions of care.  The patient was admitted for sepsis and bacteremia due to IV drug abuse at home.  The patient has been hospitalized for 24 days at this point and will remain inpatient until 01/19/2021 to receive IV antibiotics through her current PICC lines since the patient has history of IV drug abuse and can not go home with PICC line.  The patient is physically deconditioned at this point and CM will continue to follow the patient for needed dme for home closer to discharge.  The patient verbalizes that she plans to go home to her mother's home in [place], [place] when she is discharged.  The patient has no insurance at this time and will need medical follow up at an indigent PCP in [place], [place] when she is discharged to home.  CM and MSW will continue to follow the patient for discharge needs - pending on/after date - January 19, 2021.   Expected Discharge Plan: Home/Self Care Barriers to Discharge: Continued Medical Work up (Patient to discharge home with mother on/after 01/19/2021)   Patient Goals and CMS Choice Patient states their goals for this hospitalization and ongoing recovery are:: Patient plans to discharge home with her mother in [place], [place] CMS [place].gov Compare Post Acute Care list provided to:: Patient Choice offered to / list presented to : Patient  Expected Discharge Plan and Services Expected Discharge Plan: Home/Self Care In-house Referral: Clinical Social Work Discharge Planning Services: CM [place] appt scheduled,Medication Assistance   Living arrangements for the past 2 months: Apartment                                       Prior Living Arrangements/Services Living arrangements for the past 2 months: Apartment   Patient language and need for interpreter reviewed:: Yes Do you feel safe going back to the place where you live?: No   Patient is physically deconditioned at this point and plans to discharge home with her mother in [place], [place].  Need for Family Participation in Patient Care: Yes (Comment) Care giver support system in place?: Yes (comment)   Criminal Activity/Legal Involvement Pertinent to Current Situation/Hospitalization: No - Comment as needed  Activities of Daily Living      Permission Sought/Granted Permission sought to share information with : Case Manager,PCP,Family Supports Permission granted to share information with : Yes, Verbal Permission Granted     Permission granted to share info w AGENCY: PCP for follow up, most likely in [place], [place] granted to share info w Relationship: Patient's mother     Emotional Assessment Appearance:: Appears stated age Attitude/Demeanor/Rapport: Engaged Affect (typically observed): Accepting Orientation: : Oriented to Self,Oriented to Place,Oriented to  Time,Oriented to Situation Alcohol / Substance Use: Illicit Drugs Psych Involvement: No (comment)  Admission diagnosis:  Septic embolism ([place]) [I76] Acute cystitis without hematuria [N30.00] Severe sepsis ([place]) [A41.9, R65.20] Sepsis, due to unspecified organism, unspecified whether acute organ dysfunction present [facility]) [A41.9] Patient Active Problem List   Diagnosis Date  Noted  . Acute on chronic respiratory failure with hypoxia ([place])   . Abdominal pain   . Left foot pain   . Septic embolism ([place])   . Endocarditis of tricuspid valve   . 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 MRSA bacteremia   . Malnutrition of moderate degree 11/23/2020  . Acute cystitis without hematuria   . Poor dentition 11/16/2020  . Malnutrition ([place])  11/16/2020  . Thrombocytopenia ([place]) 11/16/2020  . Tobacco dependence 11/16/2020  . Severe sepsis due to methicillin resistant Staphylococcus aureus (MRSA) with acute organ dysfunction ([place]) 11/15/2020   PCP:  Patient, No Pcp Per Pharmacy:   [facility] [address] [place] [address] Phone: [phone] Fax: [phone]     Social Determinants of Health (SDOH) Interventions    Readmission Risk Interventions Readmission Risk Prevention Plan 12/10/2020  Transportation Screening Complete  PCP or Specialist Appt within 3-5 Days Complete  HRI or [place] Complete  Social Work Consult for [place] Planning/Counseling Complete  Palliative Care Screening Complete  Medication Review [employer]) Complete

## 2020-12-11 LAB — URINALYSIS, ROUTINE W REFLEX MICROSCOPIC
Bilirubin Urine: NEGATIVE
Glucose, UA: NEGATIVE mg/dL
Ketones, ur: NEGATIVE mg/dL
Nitrite: NEGATIVE
Protein, ur: 30 mg/dL — AB
Specific Gravity, Urine: 1.004 — ABNORMAL LOW (ref 1.005–1.030)
pH: 6 (ref 5.0–8.0)

## 2020-12-11 LAB — AEROBIC/ANAEROBIC CULTURE W GRAM STAIN (SURGICAL/DEEP WOUND): Culture: NO GROWTH

## 2020-12-11 NOTE — Progress Notes (Signed)
Nutrition Follow-up  DOCUMENTATION CODES:   Non-severe (moderate) malnutrition in context of social or environmental circumstances  INTERVENTION:  -Continue Ensure Enlive po QID, each supplement provides 350 kcal and 20 grams of protein -Continue MVI with minerals daily -Continue Magic cup BID with meals, each supplement provides 290 kcal and 9 grams of protein  NUTRITION DIAGNOSIS:   Moderate Malnutrition related to social / environmental circumstances (IVDU) as evidenced by mild fat depletion,moderate fat depletion,mild muscle depletion,moderate muscle depletion. -- ongoing  GOAL:   Patient will meet greater than or equal to 90% of their needs -- progressing  MONITOR:   PO intake,Supplement acceptance,Labs,Weight trends,Skin,I & O's  REASON FOR ASSESSMENT:   Consult Assessment of nutrition requirement/status,Poor PO  ASSESSMENT:   40 YOF admitted for severe sepsis w/ MRSA acute organ dysfunction. Septic emboli to both lungs, kidneys, R should, L ankle. Hx of IVDU, acute resp failure, tachypnea, AKI, cellulitis on L ankle, chronic constipation.  02/20 - SLP eval, recommendation for full liquid diet  02/23 - SLP eval, Dysphagia 1 diet with thin liquids 02/26 - diet advanced to dysphagia 2, thin liquids  3/3- cortrak tube removed; s/p MRI of rt shoulder showed R subdeltoid abscess and reactive marrow edema 3/4- s/p BSE- advanced to regular diet with thin liquids  3/5- s/p I&D of R shoulder subdeltoid abscess w/ multiple cavities  Pt to remain hospitalized until 01/19/21 to receive IV abx through PICC line as pt has a history of IV drug abuse and cannot go home with a PICC line.   Pt denies N/V and states appetite has been improving. Since last RD assessment, pt has had 3 meal completions recorded as 50-90% (73% average intake). Pt with orders for Ensure QID and typically does well with these per RN. Recommend continue current nutrition plan of care.  UOP: x24 hours JP  Drain: 54ml output x24 hours  Medications: colace, folvite, mvi, protonix, miralax, senokot, thiamine Labs: K+ 3.2 (L)  Diet Order:   Diet Order            Diet regular Room service appropriate? Yes; Fluid consistency: Thin  Diet effective now                 EDUCATION NEEDS:   No education needs have been identified at this time  Skin:  Skin Assessment: Skin Integrity Issues: Skin Integrity Issues:: Incisions Incisions: closed incision R shoulder  Last BM:  3/9 type 6  Height:   Ht Readings from Last 1 Encounters:  11/16/20 5\' 7"  (1.702 m)    Weight:   Wt Readings from Last 1 Encounters:  12/02/20 75.2 kg    Ideal Body Weight:  61.4 kg  BMI:  Body mass index is 25.97 kg/m.  Estimated Nutritional Needs:   Kcal:  2100-2300  Protein:  115-130 grams  Fluid:  > 2 L    02/01/21, MS, RD, LDN RD pager number and weekend/on-call pager number located in [place].

## 2020-12-11 NOTE — Progress Notes (Signed)
Occupational Therapy Treatment Patient Details Name: Anne Bautista MRN: [id number] DOB: 04-15-1986 Today's Date: 12/11/2020    History of present illness 35 yo admitted 2/14 with sepsis due to MRSA bacteremia in setting of IVDU. Rt shoulder pain s/p aspiration 2/14. Underwent I&D R shoulder 3/5. Pt had multiple subdeltoid abscesses. PMHx: IVDU, malnutrition, poor dentition.   OT comments  Patient met lying supine in bed in agreement with OT treatment session. Patient making great progress toward goals demonstrating Min A overall for observed ADLs. Patient with continued limited participation declining bathing/toileting/grooming and therapeutic exercise this date. Patient completes sit to stand transfers with supervision A for safety and short-distance mobility in room with Min guard for safety without AD. Patient continues to be limited by deficits listed below 2/2 diagnosis above and would benefit from continued acute OT services in prep for safe d/c to next level of care. Patient states that she would like to d/c home with her mother who lives in a different city. Will need to confirm this information. Patient would benefit from d/c home with initial 24hr supervision/assist. If family is unable to provide necessary assist, recommendation for SNF rehab remains appropriate. Will continue to follow acutely as patient will be here for several more weeks according to MD note.    Follow Up Recommendations  SNF;Home health OT;Supervision - Intermittent    Equipment Recommendations  None recommended by OT    Recommendations for Other Services      Precautions / Restrictions Precautions Precautions: Fall Restrictions Weight Bearing Restrictions: No       Mobility Bed Mobility Overal bed mobility: Modified Independent             General bed mobility comments: Completes task in reasonable amount of time without external asssit.    Transfers Overall transfer level: Needs  assistance Equipment used: None Transfers: Sit to/from Stand Sit to Stand: Supervision         General transfer comment: Supervision A x several trials. Patient with only mild unsteadiness during first trial.    Balance Overall balance assessment: Needs assistance Sitting-balance support: Bilateral upper extremity supported;Feet supported Sitting balance-Leahy Scale: Good     Standing balance support: Bilateral upper extremity supported Standing balance-Leahy Scale: Good                             ADL either performed or assessed with clinical judgement   ADL                       Lower Body Dressing: Minimal assistance;Sit to/from stand Lower Body Dressing Details (indicate cue type and reason): Min A for steadying. Patient able to don bilateral footwear with set-up assist.             Functional mobility during ADLs: Min guard General ADL Comments: Patient in agreement with progressing to recliner. Decreased safety awareness with patient standing multiple times prior to OT instruction to do so.     Vision       Perception     Praxis      Cognition Arousal/Alertness: Awake/alert Behavior During Therapy: WFL for tasks assessed/performed Overall Cognitive Status: Impaired/Different from baseline Area of Impairment: Safety/judgement;Awareness;Problem solving                       Following Commands: Follows multi-step commands with increased time;Follows one step commands consistently Safety/Judgement: Decreased awareness of safety;Decreased awareness of  deficits Awareness: Emergent Problem Solving: Slow processing;Requires verbal cues General Comments: Patient more alert/awake this date. Patient more agreeable with therapy efforts but refuses therapeutic exercise.        Exercises     Shoulder Instructions       General Comments Patient more awake this date. Declined ADLs standing at sink or therapeutic exercise.     Pertinent Vitals/ Pain       Pain Assessment: 0-10 Pain Score: 4  Pain Location: low back Pain Descriptors / Indicators: Aching Pain Intervention(s): Monitored during session  Home Living                                          Prior Functioning/Environment              Frequency  Min 2X/week        Progress Toward Goals  OT Goals(current goals can now be found in the care plan section)  Progress towards OT goals: Progressing toward goals  Acute Rehab OT Goals Patient Stated Goal: to go home OT Goal Formulation: With patient Time For Goal Achievement: 12/15/20 Potential to Achieve Goals: Good ADL Goals Pt Will Perform Grooming: with supervision;sitting Pt Will Perform Upper Body Dressing: with supervision;sitting Pt Will Perform Lower Body Dressing: sit to/from stand;with min guard assist Pt Will Transfer to Toilet: with min guard assist;ambulating Pt/caregiver will Perform Home Exercise Program: Increased strength;Both right and left upper extremity  Plan Discharge plan remains appropriate;Frequency remains appropriate    Co-evaluation                 AM-PAC OT "6 Clicks" Daily Activity     Outcome Measure   Help from another person eating meals?: None Help from another person taking care of personal grooming?: A Little Help from another person toileting, which includes using toliet, bedpan, or urinal?: A Lot Help from another person bathing (including washing, rinsing, drying)?: A Lot Help from another person to put on and taking off regular upper body clothing?: A Little Help from another person to put on and taking off regular lower body clothing?: A Little 6 Click Score: 17    End of Session    OT Visit Diagnosis: Unsteadiness on feet (R26.81);Muscle weakness (generalized) (M62.81);Other symptoms and signs involving cognitive function;Pain   Activity Tolerance Patient tolerated treatment well   Patient Left in  chair;with call bell/phone within reach;with chair alarm set   Nurse Communication Mobility status        Time: 9767-3419 OT Time Calculation (min): 21 min  Charges: OT General Charges $OT Visit: 1 Visit OT Treatments $Therapeutic Activity: 8-22 mins  Beckett Hickmon H. OTR/L Supplemental OT, Department of rehab services [phone]   Tamella Tuccillo R H. 12/11/2020, 1:09 PM

## 2020-12-11 NOTE — Progress Notes (Signed)
     Anne Bautista is a 35 y.o. female   Orthopaedic diagnosis: Status post right shoulder subdeltoid abscess drainage  Subjective: Patient states her right shoulder pain is much better.  She is somewhat sore but is able to use it normally.  Objectyive: Vitals:   12/11/20 0851 12/11/20 1100  BP: 132/79 113/71  Pulse: (!) 113 (!) 101  Resp: 20 19  Temp: 99.2 F (37.3 C) 99 F (37.2 C)  SpO2: 94%      Exam: Awake and alert Respirations even and unlabored No acute distress  Right shoulder range of motion normal.  She is able to put on her sweatshirt without significant discomfort.  Drain in place.  Minimal drainage.  Incision is healing well without evidence of infection.  Assessment: Status post above, doing well   Plan: We will remove the drain today.  We will leave the sutures in place for another week.  They will be removed then.  She is staying in the hospital until late April for IV antibiotics given her severe infection.  Her shoulder is much better.  We will continue to monitor this.  She will not likely require outpatient orthopedic follow-up.     Nicki Gu[place], MD

## 2020-12-12 LAB — URINE CULTURE: Culture: NO GROWTH

## 2020-12-12 MED ORDER — SODIUM CHLORIDE 0.9 % IV SOLN
750.0000 mg | Freq: Every day | INTRAVENOUS | Status: DC
  Administered 2020-12-12 – 2020-12-18 (×7): 750 mg via INTRAVENOUS
  Filled 2020-12-12 (×9): qty 15

## 2020-12-12 NOTE — Progress Notes (Signed)
[facility] PROGRESS NOTE  Anne Bautista RCV:[id number] DOB: [birth year] DOA: 11/15/2020 PCP: Patient, No Pcp Per  Status:  Remains inpatient appropriate because:Unsafe d/c plan and IV treatments appropriate due to intensity of illness or inability to take PO   Dispo: The patient is from: Home              Anticipated d/c is to: SNF recommended by PT due to physical debility              Barriers to discharge: Lack of funding-currently Medicaid potential, history of substance abuse.  Of note prior to admission patient was residing at the [facility].  Patient reports she will live with her mother in [place] after discharge.              Patient currently is not medically stable to d/c.  Patient will remain on IV daptomycin until 4/18.  We also need clarification regarding possibility of valvular angio back surgery during hospitalization   Difficult to place patient No   Level of care: Med-Surg  Code Status: Full Family Communication: Patient only DVT prophylaxis: SQ Heparin Vaccination status: Unknown  Foley catheter: No  HPI: 35 year old female with history of IV drug use, tobacco use, depression came to the hospital with myalgias and cough, he was septic on arrival admitted to the ICU.  She was found to have MRSA bacteremia, tricuspid valve endocarditis as well as septic emboli to lungs, kidneys.  Infectious disease and cardiothoracic surgery consulted.  Hospital course complicated by worsening right shoulder pain found to have septic arthritis, with cultures showing yeast   Subjective: Awake and laying in bed.  Noted has hood of hoodie overhead.  States is cold.  Ambient room temperature increased.  No complaints.  States did not get to ambulate outside of room yesterday.  Unclear if this is related to her contact isolation status.  Objective: Vitals:   12/12/20 0313 12/12/20 0803  BP: 125/74 133/78  Pulse: 91 98  Resp: 19 18  Temp: 98.1 F  (36.7 C) 99.2 F (37.3 C)  SpO2: 98% 98%    Intake/Output Summary (Last 24 hours) at 12/12/2020 0806 Last data filed at 12/12/2020 0727 Gross per 24 hour  Intake 1059 ml  Output 250 ml  Net 809 ml   Filed Weights   11/30/20 0500 12/01/20 0328 12/02/20 0300  Weight: 77.6 kg 74 kg 75.2 kg    Exam:  Constitutional: Alert, calm and in no acute distress Respiratory: Lung sounds are clear and she is stable on room air.  Continues to have dyspnea on exertion Cardiovascular: Pulse is regular, no JVD or peripheral edema.  Grade 3/5 systolic murmur left sternal border fourth intercostal space.  Abdomen: Soft nontender nondistended with normoactive bowel sounds. LBM 3/9 Musculoskeletal: no clubbing / cyanosis. No joint deformity upper and lower extremities. Good ROM, no contractures. Normal muscle tone.  Neurologic: CN 2-12 grossly intact. Sensation intact, DTR normal. Strength 5/5 x all 4 extremities.  Psychiatric: Alert, oriented x3.  Pleasant affect   Assessment/Plan: Acute problems: Severe sepsis due to MRSA bacteremia/ -Complicated by septic emboli to both lungs and kidneys, right shoulder possibly septic left ankle (MRI refused by patient).  She also has a right subdeltoid abscess. Left ankle improving, no further issues, no pain -ID, cardiothoracic surgery, orthopedic surgery following -Status post incision and drainage of right shoulder subdeltoid abscess with multiple cavities on 3/5 by Dr. Susa Simmonds. Drain in place, removal per ortho  -  Continue daptomycin until 01/19/2021, monitor CK. Shoulder cultures revealed yeast so started on anidulafungin but culture result corrected as rare gram-positive cocci so antifungal medication discontinued-final cultures pending -Last blood cultures negative on 2/18, has a PICC line since 2/28.  Tricuspid valve endocarditis -Cardiothoracic surgery evaluated patient early in the hospitalization for possible angio VAC debridement.  -On 3/9 a secure chat  to Dr. Cliffton AstersLightfoot to determine if they still wish to proceed with surgical intervention during this hospitalization. -Recent chest x-ray unremarkable no evidence of heart failure.  BNP stable in the 450 range  Septic emboli to left ankle and right shoulder -Status post drainage of right subdeltoid abscess -Drain removed by orthopedic team on 3/10 with documentation that sutures to remain in place for an additional week  Fever -Has been having myalgias for several days and last night (3/9) spiked temp 101 -at 11 PM on 3/10 patient did have low-grade fever of 100.6 -CXR neg and blood cultures pending -Urinalysis essentially bland with large hemoglobin, trace leukocytes, 30 of protein, rare bacteria with 0-5 WBCs and a specific gravity of 1.004-urine culture  IV drug use w/ Heroin, cocaine use, marijuana, tobacco  -cessation was encouraged.   -UDS was positive for cocaine.   -Hep C and HIV are negative.  RPR is negative. -Is on methadone and she would like to continue this after discharge.  Did not wish to transition to Suboxone after discharge.  Acute hypoxic respiratory failure, narcotic withdrawal, bilateral pulmonary septic emboli  -hypoxia resolved, remains on room air   Acute blood loss anemia, iron deficiency anemia  -post operatively, received a total of 2U pRBC, one on 3/6 and one on 3/7  Nonoliguric AKI with ATN  -creatinine peaked at 2.8, fluctuating but overall stable, 1.4 today   Debility/deconditioning PT  NOTE 3/8: Pt with better color and energy level today than yesterday. Resistant to getting up at first but easily encouraged. Pt worked on sitting and standing balance exercises with balloon that therapist brought. Pt then agreeable to ambulation out of room. With use of RW pt able to ambulate 5380' with min-guard A. C/o B ankle pain by end of ambulation and discussed ROM and strengthening for B ankles. PT will continue to follow. OT NOTE 3/8: pt reports pain from HA  declining OT session, will check back as time allows.  Moderate protein calorie malnutrition Nutrition Problem: Moderate Malnutrition Etiology: social / environmental circumstances (IVDU) Signs/Symptoms: mild fat depletion,moderate fat depletion,mild muscle depletion,moderate muscle depletion Interventions: Ensure Enlive (each supplement provides 350kcal and 20 grams of protein),MVI Estimated body mass index is 25.97 kg/m as calculated from the following:   Height as of this encounter: 5\' 7"  (1.702 m).   Weight as of this encounter: 75.2 kg.   Wounds: Incision (Closed) 12/06/20 Shoulder Right (Active)  Date First Assessed/Time First Assessed: 12/06/20 0914   Location: Shoulder  Location Orientation: Right    Assessments 12/06/2020  8:56 AM 12/11/2020  8:15 PM  Dressing Type Adhesive bandage Gauze (Comment)  Dressing Clean;Dry;Intact Reinforced  Site / Wound Assessment Dressing in place / Unable to assess Dry;Clean  Drainage Amount None None     No Linked orders to display     Other problems: Hypertension -continue current regimen  Acute cystitis without hematuria  -MRSA in cultures, she is on antibiotics  Data Reviewed: Basic Metabolic Panel: Recent Labs  Lab 12/07/20 0500 12/08/20 0500 12/09/20 0414  NA 136 136 135  K 4.0 3.5 3.2*  CL 103 104 103  CO2 23 22 23   GLUCOSE 107* 103* 108*  BUN 33* 22* 16  CREATININE 1.96* 1.70* 1.43*  CALCIUM 8.1* 8.1* 8.3*  MG  --  1.8  --   PHOS  --  3.7  --    Liver Function Tests: Recent Labs  Lab 12/07/20 0500 12/08/20 0500  AST 20 20  ALT 19 16  ALKPHOS 65 72  BILITOT 0.5 0.4  PROT 8.1 8.0  ALBUMIN 1.6* 1.8*   No results for input(s): LIPASE, AMYLASE in the last 168 hours. No results for input(s): AMMONIA in the last 168 hours. CBC: Recent Labs  Lab 12/07/20 0500 12/07/20 1812 12/08/20 0500 12/09/20 0414  WBC 8.6  --  11.2* 16.3*  HGB 6.0* 7.1* 6.6* 8.0*  HCT 19.8* 22.6* 20.8* 24.2*  MCV 85.0  --  83.5 83.4   PLT 288  --  315 338   Cardiac Enzymes: Recent Labs  Lab 12/07/20 0500  CKTOTAL 47   BNP (last 3 results) Recent Labs    11/27/20 0430 11/28/20 0505 12/10/20 1600  BNP 670.1* 455.5* 458.5*    ProBNP (last 3 results) No results for input(s): PROBNP in the last 8760 hours.  CBG: Recent Labs  Lab 12/09/20 0729 12/09/20 1216 12/09/20 1752 12/09/20 2114 12/10/20 0609  GLUCAP 132* 113* 87 107* 101*    Recent Results (from the past 240 hour(s))  Aerobic/Anaerobic Culture w Gram Stain (surgical/deep wound)     Status: None   Collection Time: 12/06/20  8:39 AM   Specimen: PATH Other; Body Fluid  Result Value Ref Range Status   Specimen Description ABSCESS RIGHT SHOULDER ABSCESS SPEC A  Final   Special Requests RIGHT SHOULDER ABSCESS  Final   Gram Stain   Final    FEW WBC PRESENT, PREDOMINANTLY PMN RARE GRAM POSITIVE COCCI CORRECTED RESULTS PREVIOUSLY REPORTED AS: RARE YEAST CORRECTED RESULTS CALLED TO: 02/05/21 PHARMD @1010  12/09/20 EB    Culture   Final    No growth aerobically or anaerobically. Performed at [facility], [address] [place], [address] [place]    Report Status 12/11/2020 FINAL  Final  Culture, blood (routine x 2)     Status: None (Preliminary result)   Collection Time: 12/10/20 10:00 PM   Specimen: BLOOD RIGHT HAND  Result Value Ref Range Status   Specimen Description BLOOD RIGHT HAND  Final   Special Requests   Final    BOTTLES DRAWN AEROBIC AND ANAEROBIC Blood Culture adequate volume   Culture   Final    NO GROWTH < 24 HOURS Performed at [facility], [address] [place], [address] [place]    Report Status PENDING  Incomplete  Culture, blood (routine x 2)     Status: None (Preliminary result)   Collection Time: 12/10/20 10:15 PM   Specimen: BLOOD LEFT HAND  Result Value Ref Range Status   Specimen Description BLOOD LEFT HAND  Final   Special Requests   Final    BOTTLES DRAWN AEROBIC AND ANAEROBIC Blood Culture  adequate volume   Culture   Final    NO GROWTH < 24 HOURS Performed at [facility], [address], [place], [address] [place]    Report Status PENDING  Incomplete     Studies: DG CHEST PORT 1 VIEW  Result Date: 12/10/2020 CLINICAL DATA:  Dyspnea EXAM: PORTABLE CHEST 1 VIEW COMPARISON:  11/25/2020 FINDINGS: Persistent bilateral consolidative and nodular pulmonary opacities with improved aeration compared to the prior  study. Possible small left pleural effusion. No pneumothorax. Stable cardiomediastinal contours. Left PICC line tip overlies cavoatrial junction. IMPRESSION: Persistent bilateral pulmonary opacities with improvement in aeration. Electronically Signed   By: Guadlupe Spanish M.D.   On: 12/10/2020 15:55    Scheduled Meds: . amLODipine  10 mg Oral Daily  . chlorhexidine  15 mL Mouth Rinse BID  . Chlorhexidine Gluconate Cloth  6 each Topical Daily  . cloNIDine  0.1 mg Oral BID  . dicyclomine  20 mg Oral TID AC & HS  . docusate sodium  100 mg Oral BID  . feeding supplement  237 mL Oral QID  . folic acid  1 mg Oral Daily  . heparin  5,000 Units Subcutaneous Q8H  . mouth rinse  15 mL Mouth Rinse q12n4p  . methadone  10 mg Oral Q8H  . multivitamin with minerals  1 tablet Oral Daily  . pantoprazole  40 mg Oral Daily  . polyethylene glycol  17 g Oral BID  . QUEtiapine  25 mg Oral BID  . senna  2 tablet Oral Daily  . sodium chloride flush  10-40 mL Intracatheter Q12H  . thiamine  100 mg Oral Daily   Continuous Infusions: . sodium chloride Stopped (12/05/20 0656)  . DAPTOmycin (CUBICIN)  IV    . lactated ringers 10 mL/hr at 12/06/20 0730    Principal Problem:   Severe sepsis due to methicillin resistant Staphylococcus aureus (MRSA) with acute organ dysfunction (HCC) Active Problems:   Poor dentition   Malnutrition (HCC)   Thrombocytopenia (HCC)   Tobacco dependence   Acute cystitis without hematuria   Malnutrition of moderate degree   Abdominal pain   Left foot  pain   Septic embolism (HCC)   Endocarditis of tricuspid valve   Staphylococcal arthritis of right shoulder (HCC)   MRSA bacteremia   Acute on chronic respiratory failure with hypoxia (HCC)   Sepsis (HCC)   Consultants: Infectious disease Cardiothoracic surgery Orthopedic surgery  Procedures:  2D echocardiogram  Anesthesia on 3/3  Irrigation and debridement right shoulder abscess on 3/5  Antibiotics: Anti-infectives (From admission, onward)   Start     Dose/Rate Route Frequency Ordered Stop   12/12/20 2000  DAPTOmycin (CUBICIN) 750 mg in sodium chloride 0.9 % IVPB        750 mg 230 mL/hr over 30 Minutes Intravenous Daily 12/12/20 0719     12/09/20 1200  anidulafungin (ERAXIS) 100 mg in sodium chloride 0.9 % 100 mL IVPB  Status:  Discontinued        100 mg 78 mL/hr over 100 Minutes Intravenous Every 24 hours 12/08/20 1109 12/09/20 1013   12/08/20 1200  anidulafungin (ERAXIS) 200 mg in sodium chloride 0.9 % 200 mL IVPB        200 mg 78 mL/hr over 200 Minutes Intravenous  Once 12/08/20 1109 12/08/20 1724   12/06/20 0833  vancomycin (VANCOCIN) powder  Status:  Discontinued          As needed 12/06/20 0834 12/06/20 0852   12/06/20 0600  ceFAZolin (ANCEF) IVPB 2g/100 mL premix        2 g 200 mL/hr over 30 Minutes Intravenous On call to O.R. 12/06/20 0306 12/06/20 0755   11/28/20 2000  DAPTOmycin (CUBICIN) 764 mg in sodium chloride 0.9 % IVPB  Status:  Discontinued        10 mg/kg  76.4 kg 230.6 mL/hr over 30 Minutes Intravenous Daily 11/28/20 0727 12/12/20 0719   11/24/20 2000  DAPTOmycin (  CUBICIN) 827 mg in sodium chloride 0.9 % IVPB  Status:  Discontinued        10 mg/kg  82.7 kg 233.1 mL/hr over 30 Minutes Intravenous Daily 11/24/20 0821 11/28/20 0727   11/19/20 1000  DAPTOmycin (CUBICIN) 770 mg in sodium chloride 0.9 % IVPB  Status:  Discontinued        10 mg/kg  77 kg 230.8 mL/hr over 30 Minutes Intravenous Daily 11/19/20 0908 11/24/20 0821   11/19/20 1000   doxycycline (VIBRA-TABS) tablet 100 mg        100 mg Oral Every 12 hours 11/19/20 0908 11/23/20 2123   11/19/20 0439  vancomycin variable dose per unstable renal function (pharmacist dosing)  Status:  Discontinued         Does not apply See admin instructions 11/19/20 0439 11/19/20 0853   11/16/20 2000  vancomycin (VANCOREADY) IVPB 1250 mg/250 mL  Status:  Discontinued        1,250 mg 166.7 mL/hr over 90 Minutes Intravenous Every 24 hours 11/15/20 1933 11/16/20 1108   11/16/20 1200  vancomycin (VANCOCIN) IVPB 1000 mg/200 mL premix  Status:  Discontinued        1,000 mg 200 mL/hr over 60 Minutes Intravenous Every 12 hours 11/16/20 1108 11/19/20 0439   11/16/20 0745  metroNIDAZOLE (FLAGYL) IVPB 500 mg        500 mg 100 mL/hr over 60 Minutes Intravenous  Once 11/16/20 0741 11/16/20 0907   11/15/20 2237  vancomycin (VANCOCIN) 500 MG powder       Note to Pharmacy: Ihor Dow   : cabinet override      11/15/20 2237 11/16/20 1044   11/15/20 2000  ceFEPIme (MAXIPIME) 2 g in sodium chloride 0.9 % 100 mL IVPB  Status:  Discontinued        2 g 200 mL/hr over 30 Minutes Intravenous Every 12 hours 11/15/20 1933 11/16/20 1212   11/15/20 1945  vancomycin (VANCOREADY) IVPB 1500 mg/300 mL  Status:  Discontinued        1,500 mg 150 mL/hr over 120 Minutes Intravenous  Once 11/15/20 1933 11/15/20 1939   11/15/20 1945  vancomycin (VANCOCIN) IVPB 1000 mg/200 mL premix       "And" Linked Group Details   1,000 mg 200 mL/hr over 60 Minutes Intravenous  Once 11/15/20 1939 11/15/20 2231   11/15/20 1945  vancomycin (VANCOREADY) IVPB 500 mg/100 mL       "And" Linked Group Details   500 mg 100 mL/hr over 60 Minutes Intravenous  Once 11/15/20 1939 11/16/20 0002       Time spent: 20 minutes    Junious Silk ANP  [facility] 7 am - 330 pm/M-F for direct patient care and secure chat Please refer to Amion for contact info 26  days

## 2020-12-12 NOTE — Progress Notes (Signed)
PT Cancellation Note  Patient Details Name: Anne Bautista MRN: [id number] DOB: 1986-07-01   Cancelled Treatment:    Reason Eval/Treat Not Completed: Pain limiting ability to participate - pt reports severe nausea, unable to mobilize at this time. PT to check back as schedule allows.  Marye Round, PT Acute Rehabilitation Services Pager [phone]  Office [phone]    Truddie Coco 12/12/2020, 5:02 PM

## 2020-12-12 NOTE — Progress Notes (Signed)
Subjective:  No new complaints  Antibiotics:  Anti-infectives (From admission, onward)   Start     Dose/Rate Route Frequency Ordered Stop   12/12/20 2000  DAPTOmycin (CUBICIN) 750 mg in sodium chloride 0.9 % IVPB        750 mg 230 mL/hr over 30 Minutes Intravenous Daily 12/12/20 0719     12/09/20 1200  anidulafungin (ERAXIS) 100 mg in sodium chloride 0.9 % 100 mL IVPB  Status:  Discontinued        100 mg 78 mL/hr over 100 Minutes Intravenous Every 24 hours 12/08/20 1109 12/09/20 1013   12/08/20 1200  anidulafungin (ERAXIS) 200 mg in sodium chloride 0.9 % 200 mL IVPB        200 mg 78 mL/hr over 200 Minutes Intravenous  Once 12/08/20 1109 12/08/20 1724   12/06/20 0833  vancomycin (VANCOCIN) powder  Status:  Discontinued          As needed 12/06/20 0834 12/06/20 0852   12/06/20 0600  ceFAZolin (ANCEF) IVPB 2g/100 mL premix        2 g 200 mL/hr over 30 Minutes Intravenous On call to O.R. 12/06/20 0306 12/06/20 0755   11/28/20 2000  DAPTOmycin (CUBICIN) 764 mg in sodium chloride 0.9 % IVPB  Status:  Discontinued        10 mg/kg  76.4 kg 230.6 mL/hr over 30 Minutes Intravenous Daily 11/28/20 0727 12/12/20 0719   11/24/20 2000  DAPTOmycin (CUBICIN) 827 mg in sodium chloride 0.9 % IVPB  Status:  Discontinued        10 mg/kg  82.7 kg 233.1 mL/hr over 30 Minutes Intravenous Daily 11/24/20 0821 11/28/20 0727   11/19/20 1000  DAPTOmycin (CUBICIN) 770 mg in sodium chloride 0.9 % IVPB  Status:  Discontinued        10 mg/kg  77 kg 230.8 mL/hr over 30 Minutes Intravenous Daily 11/19/20 0908 11/24/20 0821   11/19/20 1000  doxycycline (VIBRA-TABS) tablet 100 mg        100 mg Oral Every 12 hours 11/19/20 0908 11/23/20 2123   11/19/20 0439  vancomycin variable dose per unstable renal function (pharmacist dosing)  Status:  Discontinued         Does not apply See admin instructions 11/19/20 0439 11/19/20 0853   11/16/20 2000  vancomycin (VANCOREADY) IVPB 1250 mg/250 mL  Status:   Discontinued        1,250 mg 166.7 mL/hr over 90 Minutes Intravenous Every 24 hours 11/15/20 1933 11/16/20 1108   11/16/20 1200  vancomycin (VANCOCIN) IVPB 1000 mg/200 mL premix  Status:  Discontinued        1,000 mg 200 mL/hr over 60 Minutes Intravenous Every 12 hours 11/16/20 1108 11/19/20 0439   11/16/20 0745  metroNIDAZOLE (FLAGYL) IVPB 500 mg        500 mg 100 mL/hr over 60 Minutes Intravenous  Once 11/16/20 0741 11/16/20 0907   11/15/20 2237  vancomycin (VANCOCIN) 500 MG powder       Note to Pharmacy: Ihor Dow   : cabinet override      11/15/20 2237 11/16/20 1044   11/15/20 2000  ceFEPIme (MAXIPIME) 2 g in sodium chloride 0.9 % 100 mL IVPB  Status:  Discontinued        2 g 200 mL/hr over 30 Minutes Intravenous Every 12 hours 11/15/20 1933 11/16/20 1212   11/15/20 1945  vancomycin (VANCOREADY) IVPB 1500 mg/300 mL  Status:  Discontinued  1,500 mg 150 mL/hr over 120 Minutes Intravenous  Once 11/15/20 1933 11/15/20 1939   11/15/20 1945  vancomycin (VANCOCIN) IVPB 1000 mg/200 mL premix       "And" Linked Group Details   1,000 mg 200 mL/hr over 60 Minutes Intravenous  Once 11/15/20 1939 11/15/20 2231   11/15/20 1945  vancomycin (VANCOREADY) IVPB 500 mg/100 mL       "And" Linked Group Details   500 mg 100 mL/hr over 60 Minutes Intravenous  Once 11/15/20 1939 11/16/20 0002      Medications: Scheduled Meds: . amLODipine  10 mg Oral Daily  . chlorhexidine  15 mL Mouth Rinse BID  . Chlorhexidine Gluconate Cloth  6 each Topical Daily  . cloNIDine  0.1 mg Oral BID  . dicyclomine  20 mg Oral TID AC & HS  . docusate sodium  100 mg Oral BID  . feeding supplement  237 mL Oral QID  . folic acid  1 mg Oral Daily  . heparin  5,000 Units Subcutaneous Q8H  . mouth rinse  15 mL Mouth Rinse q12n4p  . methadone  10 mg Oral Q8H  . multivitamin with minerals  1 tablet Oral Daily  . pantoprazole  40 mg Oral Daily  . polyethylene glycol  17 g Oral BID  . QUEtiapine  25 mg Oral BID   . senna  2 tablet Oral Daily  . sodium chloride flush  10-40 mL Intracatheter Q12H  . thiamine  100 mg Oral Daily   Continuous Infusions: . sodium chloride Stopped (12/05/20 0656)  . DAPTOmycin (CUBICIN)  IV    . lactated ringers 10 mL/hr at 12/06/20 0730   PRN Meds:.sodium chloride, acetaminophen **OR** acetaminophen, bisacodyl, guaiFENesin, HYDROmorphone (DILAUDID) injection, levalbuterol, LORazepam, metoprolol tartrate, nicotine, oxyCODONE, sodium chloride flush    Objective: Weight change:   Intake/Output Summary (Last 24 hours) at 12/12/2020 1221 Last data filed at 12/12/2020 0900 Gross per 24 hour  Intake 907 ml  Output 800 ml  Net 107 ml   Blood pressure 131/74, pulse 87, temperature 98.6 F (37 C), temperature source Oral, resp. rate 18, height  (1.702 m), weight 75.2 kg, last menstrual period 11/10/2020, SpO2 97 %. Temp:  [98.1 F (36.7 C)-100.1 F (37.8 C)] 98.6 F (37 C) (03/11 1120) Pulse Rate:  [87-118] 87 (03/11 1120) Resp:  [18-19] 18 (03/11 1120) BP: (122-143)/(72-78) 131/74 (03/11 1120) SpO2:  [96 %-100 %] 97 % (03/11 1120)  Physical Exam: Physical Exam Constitutional:      General: She is not in acute distress.    Appearance: She is well-developed. She is not diaphoretic.  HENT:     Head: Normocephalic and atraumatic.     Right Ear: External ear normal.     Left Ear: External ear normal.     Mouth/Throat:     Pharynx: No oropharyngeal exudate.  Eyes:     General: No scleral icterus.    Conjunctiva/sclera: Conjunctivae normal.     Pupils: Pupils are equal, round, and reactive to light.  Cardiovascular:     Rate and Rhythm: Regular rhythm. Tachycardia present.     Heart sounds: No murmur heard.   Pulmonary:     Effort: Prolonged expiration present. No respiratory distress.     Breath sounds: No wheezing.  Abdominal:     General: Bowel sounds are normal. There is no distension.     Palpations: Abdomen is soft.     Tenderness: There is no  abdominal tenderness. There is no rebound.  Musculoskeletal:        General: No tenderness. Normal range of motion.  Lymphadenopathy:     Cervical: No cervical adenopathy.  Skin:    General: Skin is warm and dry.     Coloration: Skin is not pale.     Findings: No erythema or rash.  Neurological:     General: No focal deficit present.     Mental Status: She is alert and oriented to person, place, and time.     Motor: No abnormal muscle tone.     Coordination: Coordination normal.  Psychiatric:        Behavior: Behavior normal.        Thought Content: Thought content normal.        Judgment: Judgment normal.   Shoulder bandaged.    CBC:    BMET No results for input(s): NA, K, CL, CO2, GLUCOSE, BUN, CREATININE, CALCIUM in the last 72 hours.   Liver Panel  No results for input(s): PROT, ALBUMIN, AST, ALT, ALKPHOS, BILITOT, BILIDIR, IBILI in the last 72 hours.     Sedimentation Rate No results for input(s): ESRSEDRATE in the last 72 hours. C-Reactive Protein No results for input(s): CRP in the last 72 hours.  Micro Results: Recent Results (from the past 720 hour(s))  Resp Panel by RT-PCR (Flu A&B, Covid) Peripheral     Status: None   Collection Time: 11/15/20  4:10 PM   Specimen: Peripheral; Nasopharyngeal(NP) swabs in vial transport medium  Result Value Ref Range Status   SARS Coronavirus 2 by RT PCR NEGATIVE NEGATIVE Final    Comment: (NOTE) SARS-CoV-2 target nucleic acids are NOT DETECTED.  The SARS-CoV-2 RNA is generally detectable in upper respiratory specimens during the acute phase of infection. The lowest concentration of SARS-CoV-2 viral copies this assay can detect is 138 copies/mL. A negative result does not preclude SARS-Cov-2 infection and should not be used as the sole basis for treatment or other patient management decisions. A negative result may occur with  improper specimen collection/handling, submission of specimen other than nasopharyngeal  swab, presence of viral mutation(s) within the areas targeted by this assay, and inadequate number of viral copies(<138 copies/mL). A negative result must be combined with clinical observations, patient history, and epidemiological information. The expected result is Negative.  Fact Sheet for Patients:  BloggerCourse.comhttps://www.fda.gov/media/152166/download  Fact Sheet for Healthcare Providers:  SeriousBroker.ithttps://www.fda.gov/media/152162/download  This test is no t yet approved or cleared by the [place] FDA and  has been authorized for detection and/or diagnosis of SARS-CoV-2 by FDA under an Emergency Use Authorization (EUA). This EUA will remain  in effect (meaning this test can be used) for the duration of the COVID-19 declaration under Section 564(b)(1) of the Act, 21 U.S.C.section 360bbb-3(b)(1), unless the authorization is terminated  or revoked sooner.       Influenza A by PCR NEGATIVE NEGATIVE Final   Influenza B by PCR NEGATIVE NEGATIVE Final    Comment: (NOTE) The Xpert Xpress SARS-CoV-2/FLU/RSV plus assay is intended as an aid in the diagnosis of influenza from Nasopharyngeal swab specimens and should not be used as a sole basis for treatment. Nasal washings and aspirates are unacceptable for Xpert Xpress SARS-CoV-2/FLU/RSV testing.  Fact Sheet for Patients: BloggerCourse.comhttps://www.fda.gov/media/152166/download  Fact Sheet for Healthcare Providers: SeriousBroker.ithttps://www.fda.gov/media/152162/download  This test is not yet approved or cleared by the [place] FDA and has been authorized for detection and/or diagnosis of SARS-CoV-2 by FDA under an Emergency Use Authorization (EUA). This EUA will remain in effect (meaning  this test can be used) for the duration of the COVID-19 declaration under Section 564(b)(1) of the Act, 21 U.S.C. section 360bbb-3(b)(1), unless the authorization is terminated or revoked.  Performed at [facility], [address] [place], [place] [address]   Blood  culture (routine x 2)     Status: Abnormal   Collection Time: 11/15/20  4:10 PM   Specimen: BLOOD  Result Value Ref Range Status   Specimen Description   Final    BLOOD LEFT ANTECUBITAL Performed at [facility], [address] [place], [place] [address]    Special Requests   Final    BOTTLES DRAWN AEROBIC AND ANAEROBIC Blood Culture adequate volume Performed at [facility], [address] [place], [place] [address]    Culture  Setup Time   Final    GRAM POSITIVE COCCI IN BOTH AEROBIC AND ANAEROBIC BOTTLES CRITICAL RESULT CALLED TO, READ BACK BY AND VERIFIED WITH: CINDY REED,RN @0956  11/16/20 EB Performed at [facility], [address] [place], [place]    Culture METHICILLIN RESISTANT STAPHYLOCOCCUS AUREUS (A)  Final   Report Status 11/18/2020 FINAL  Final   Organism ID, Bacteria METHICILLIN RESISTANT STAPHYLOCOCCUS AUREUS  Final      Susceptibility   Methicillin resistant staphylococcus aureus - MIC*    CIPROFLOXACIN <=0.5 SENSITIVE Sensitive     ERYTHROMYCIN >=8 RESISTANT Resistant     GENTAMICIN <=0.5 SENSITIVE Sensitive     OXACILLIN >=4 RESISTANT Resistant     TETRACYCLINE <=1 SENSITIVE Sensitive     VANCOMYCIN 1 SENSITIVE Sensitive     TRIMETH/SULFA <=10 SENSITIVE Sensitive     CLINDAMYCIN <=0.25 SENSITIVE Sensitive     RIFAMPIN <=0.5 SENSITIVE Sensitive     Inducible Clindamycin NEGATIVE Sensitive     * METHICILLIN RESISTANT STAPHYLOCOCCUS AUREUS  Blood Culture ID Panel (Reflexed)     Status: Abnormal   Collection Time: 11/15/20  4:10 PM  Result Value Ref Range Status   Enterococcus faecalis NOT DETECTED NOT DETECTED Final   Enterococcus Faecium NOT DETECTED NOT DETECTED Final   Listeria monocytogenes NOT DETECTED NOT DETECTED Final   Staphylococcus species DETECTED (A) NOT DETECTED Final    Comment: CRITICAL RESULT CALLED TO, READ BACK BY AND VERIFIED WITH: CINDY REED,RN @0956  11/16/20 EB    Staphylococcus aureus (BCID)  DETECTED (A) NOT DETECTED Final    Comment: Methicillin (oxacillin)-resistant Staphylococcus aureus (MRSA). MRSA is predictably resistant to beta-lactam antibiotics (except ceftaroline). Preferred therapy is vancomycin unless clinically contraindicated. Patient requires contact precautions if  hospitalized. CRITICAL RESULT CALLED TO, READ BACK BY AND VERIFIED WITH: CINDY REED,RN @0956  11/16/20 EB    Staphylococcus epidermidis NOT DETECTED NOT DETECTED Final   Staphylococcus lugdunensis NOT DETECTED NOT DETECTED Final   Streptococcus species NOT DETECTED NOT DETECTED Final   Streptococcus agalactiae NOT DETECTED NOT DETECTED Final   Streptococcus pneumoniae NOT DETECTED NOT DETECTED Final   Streptococcus pyogenes NOT DETECTED NOT DETECTED Final   A.calcoaceticus-baumannii NOT DETECTED NOT DETECTED Final   Bacteroides fragilis NOT DETECTED NOT DETECTED Final   Enterobacterales NOT DETECTED NOT DETECTED Final   Enterobacter cloacae complex NOT DETECTED NOT DETECTED Final   Escherichia coli NOT DETECTED NOT DETECTED Final   Klebsiella aerogenes NOT DETECTED NOT DETECTED Final   Klebsiella oxytoca NOT DETECTED NOT DETECTED Final   Klebsiella pneumoniae NOT DETECTED NOT DETECTED Final   Proteus species NOT DETECTED NOT DETECTED Final   Salmonella species NOT DETECTED  NOT DETECTED Final   Serratia marcescens NOT DETECTED NOT DETECTED Final   Haemophilus influenzae NOT DETECTED NOT DETECTED Final   Neisseria meningitidis NOT DETECTED NOT DETECTED Final   Pseudomonas aeruginosa NOT DETECTED NOT DETECTED Final   Stenotrophomonas maltophilia NOT DETECTED NOT DETECTED Final   Candida albicans NOT DETECTED NOT DETECTED Final   Candida auris NOT DETECTED NOT DETECTED Final   Candida glabrata NOT DETECTED NOT DETECTED Final   Candida krusei NOT DETECTED NOT DETECTED Final   Candida parapsilosis NOT DETECTED NOT DETECTED Final   Candida tropicalis NOT DETECTED NOT DETECTED Final   Cryptococcus  neoformans/gattii NOT DETECTED NOT DETECTED Final   Meth resistant mecA/C and MREJ DETECTED (A) NOT DETECTED Final    Comment: CRITICAL RESULT CALLED TO, READ BACK BY AND VERIFIED WITH: CINDY REED,RN  11/16/20 EB Performed at [facility], [address] [place], [place] [address]   Blood culture (routine x 2)     Status: Abnormal   Collection Time: 11/15/20  4:25 PM   Specimen: BLOOD  Result Value Ref Range Status   Specimen Description   Final    BLOOD LEFT ANTECUBITAL Performed at [facility], [address] [place], [place] [address]    Special Requests   Final    BOTTLES DRAWN AEROBIC ONLY Blood Culture results may not be optimal due to an inadequate volume of blood received in culture bottles Performed at [facility], [address] [place], [place] [address]    Culture  Setup Time   Final    GRAM POSITIVE COCCI IN CLUSTERS AEROBIC BOTTLE ONLY CRITICAL VALUE NOTED.  VALUE IS CONSISTENT WITH PREVIOUSLY REPORTED AND CALLED VALUE.    Culture (A)  Final    STAPHYLOCOCCUS AUREUS SUSCEPTIBILITIES PERFORMED ON PREVIOUS CULTURE WITHIN THE LAST 5 DAYS. Performed at [facility], [address], [place], [place] [address]    Report Status 11/18/2020 FINAL  Final  Urine culture     Status: Abnormal   Collection Time: 11/15/20  7:47 PM   Specimen: Urine, Catheterized  Result Value Ref Range Status   Specimen Description   Final    URINE, CATHETERIZED Performed at [facility], [address] [place], [place] [address]    Special Requests   Final    Normal Performed at [facility], [address] [place], [place] [address]    Culture (A)  Final    >=100,000 COLONIES/mL METHICILLIN RESISTANT STAPHYLOCOCCUS AUREUS   Report Status 11/18/2020 FINAL  Final   Organism ID, Bacteria METHICILLIN RESISTANT STAPHYLOCOCCUS AUREUS (A)  Final      Susceptibility   Methicillin resistant staphylococcus aureus - MIC*     CIPROFLOXACIN <=0.5 SENSITIVE Sensitive     GENTAMICIN <=0.5 SENSITIVE Sensitive     NITROFURANTOIN <=16 SENSITIVE Sensitive     OXACILLIN >=4 RESISTANT Resistant     TETRACYCLINE <=1 SENSITIVE Sensitive     VANCOMYCIN <=0.5 SENSITIVE Sensitive     TRIMETH/SULFA <=10 SENSITIVE Sensitive     CLINDAMYCIN <=0.25 SENSITIVE Sensitive     RIFAMPIN <=0.5 SENSITIVE Sensitive     Inducible Clindamycin NEGATIVE Sensitive     * >=100,000 COLONIES/mL METHICILLIN RESISTANT STAPHYLOCOCCUS AUREUS  Culture, blood (single)     Status: Abnormal   Collection Time: 11/15/20  9:35 PM   Specimen: BLOOD RIGHT HAND  Result Value Ref Range Status   Specimen Description   Final  BLOOD RIGHT HAND Performed at [facility], [address] [place], [place] [address]    Special Requests   Final    BOTTLES DRAWN AEROBIC AND ANAEROBIC Blood Culture adequate volume Performed at [facility], [address] [place], [place] [address]    Culture  Setup Time   Final    GRAM POSITIVE COCCI IN CLUSTERS IN BOTH AEROBIC AND ANAEROBIC BOTTLES CRITICAL VALUE NOTED.  VALUE IS CONSISTENT WITH PREVIOUSLY REPORTED AND CALLED VALUE.    Culture (A)  Final    STAPHYLOCOCCUS AUREUS SUSCEPTIBILITIES PERFORMED ON PREVIOUS CULTURE WITHIN THE LAST 5 DAYS. Performed at [facility], [address] [place], [place] [address]    Report Status 11/20/2020 FINAL  Final  Culture, blood (routine x 2)     Status: None   Collection Time: 11/17/20  9:28 AM   Specimen: BLOOD RIGHT FOREARM  Result Value Ref Range Status   Specimen Description BLOOD RIGHT FOREARM  Final   Special Requests   Final    BOTTLES DRAWN AEROBIC AND ANAEROBIC Blood Culture results may not be optimal due to an inadequate volume of blood received in culture bottles   Culture   Final    NO GROWTH 5 DAYS Performed at [facility], [address] [place], [place] [address]    Report Status 11/22/2020 FINAL  Final  Culture,  blood (routine x 2)     Status: Abnormal   Collection Time: 11/17/20  9:39 AM   Specimen: BLOOD RIGHT HAND  Result Value Ref Range Status   Specimen Description BLOOD RIGHT HAND  Final   Special Requests   Final    BOTTLES DRAWN AEROBIC AND ANAEROBIC Blood Culture results may not be optimal due to an inadequate volume of blood received in culture bottles   Culture  Setup Time   Final    GRAM POSITIVE COCCI IN CLUSTERS ANAEROBIC BOTTLE ONLY Organism ID to follow CRITICAL RESULT CALLED TO, READ BACK BY AND VERIFIED WITHThresa Ross PHARMD, AT 2956 11/20/20 Renato Shin Performed at [facility], [address] [place], [place] [address]    Culture METHICILLIN RESISTANT STAPHYLOCOCCUS AUREUS (A)  Final   Report Status 11/22/2020 FINAL  Final   Organism ID, Bacteria METHICILLIN RESISTANT STAPHYLOCOCCUS AUREUS  Final      Susceptibility   Methicillin resistant staphylococcus aureus - MIC*    CIPROFLOXACIN <=0.5 SENSITIVE Sensitive     ERYTHROMYCIN >=8 RESISTANT Resistant     GENTAMICIN <=0.5 SENSITIVE Sensitive     OXACILLIN >=4 RESISTANT Resistant     TETRACYCLINE <=1 SENSITIVE Sensitive     VANCOMYCIN 1 SENSITIVE Sensitive     TRIMETH/SULFA <=10 SENSITIVE Sensitive     CLINDAMYCIN <=0.25 SENSITIVE Sensitive     RIFAMPIN <=0.5 SENSITIVE Sensitive     Inducible Clindamycin NEGATIVE Sensitive     * METHICILLIN RESISTANT STAPHYLOCOCCUS AUREUS  Blood Culture ID Panel (Reflexed)     Status: Abnormal   Collection Time: 11/17/20  9:39 AM  Result Value Ref Range Status   Enterococcus faecalis NOT DETECTED NOT DETECTED Final   Enterococcus Faecium NOT DETECTED NOT DETECTED Final   Listeria monocytogenes NOT DETECTED NOT DETECTED Final   Staphylococcus species DETECTED (A) NOT DETECTED Final    Comment: CRITICAL RESULT CALLED TO, READ BACK BY AND VERIFIED WITHThresa Ross PHARMD, AT 6578 11/20/20 D. VANHOOK    Staphylococcus aureus (BCID) DETECTED (A) NOT DETECTED Final  Comment:  Methicillin (oxacillin)-resistant Staphylococcus aureus (MRSA). MRSA is predictably resistant to beta-lactam antibiotics (except ceftaroline). Preferred therapy is vancomycin unless clinically contraindicated. Patient requires contact precautions if  hospitalized. CRITICAL RESULT CALLED TO, READ BACK BY AND VERIFIED WITHThresa Ross PHARMD, AT 1610 11/20/20 D. VANHOOK    Staphylococcus epidermidis NOT DETECTED NOT DETECTED Final   Staphylococcus lugdunensis NOT DETECTED NOT DETECTED Final   Streptococcus species NOT DETECTED NOT DETECTED Final   Streptococcus agalactiae NOT DETECTED NOT DETECTED Final   Streptococcus pneumoniae NOT DETECTED NOT DETECTED Final   Streptococcus pyogenes NOT DETECTED NOT DETECTED Final   A.calcoaceticus-baumannii NOT DETECTED NOT DETECTED Final   Bacteroides fragilis NOT DETECTED NOT DETECTED Final   Enterobacterales NOT DETECTED NOT DETECTED Final   Enterobacter cloacae complex NOT DETECTED NOT DETECTED Final   Escherichia coli NOT DETECTED NOT DETECTED Final   Klebsiella aerogenes NOT DETECTED NOT DETECTED Final   Klebsiella oxytoca NOT DETECTED NOT DETECTED Final   Klebsiella pneumoniae NOT DETECTED NOT DETECTED Final   Proteus species NOT DETECTED NOT DETECTED Final   Salmonella species NOT DETECTED NOT DETECTED Final   Serratia marcescens NOT DETECTED NOT DETECTED Final   Haemophilus influenzae NOT DETECTED NOT DETECTED Final   Neisseria meningitidis NOT DETECTED NOT DETECTED Final   Pseudomonas aeruginosa NOT DETECTED NOT DETECTED Final   Stenotrophomonas maltophilia NOT DETECTED NOT DETECTED Final   Candida albicans NOT DETECTED NOT DETECTED Final   Candida auris NOT DETECTED NOT DETECTED Final   Candida glabrata NOT DETECTED NOT DETECTED Final   Candida krusei NOT DETECTED NOT DETECTED Final   Candida parapsilosis NOT DETECTED NOT DETECTED Final   Candida tropicalis NOT DETECTED NOT DETECTED Final   Cryptococcus neoformans/gattii NOT DETECTED NOT  DETECTED Final   Meth resistant mecA/C and MREJ DETECTED (A) NOT DETECTED Final    Comment: CRITICAL RESULT CALLED TO, READ BACK BY AND VERIFIED WITHThresa Ross PHARMD, AT 9604 11/20/20 Renato Shin Performed at [facility], [address], [place], [place] [address]   MRSA PCR Screening     Status: Abnormal   Collection Time: 11/17/20 11:38 AM   Specimen: Nasopharyngeal  Result Value Ref Range Status   MRSA by PCR POSITIVE (A) NEGATIVE Final    Comment:        The GeneXpert MRSA Assay (FDA approved for NASAL specimens only), is one component of a comprehensive MRSA colonization surveillance program. It is not intended to diagnose MRSA infection nor to guide or monitor treatment for MRSA infections. RESULT CALLED TO, READ BACK BY AND VERIFIED WITH: R ROBERTS RN 1400 11/17/20 A BROWNING Performed at [facility], [address], [place], [place] [address]   Body fluid culture w Gram Stain     Status: None   Collection Time: 11/17/20  4:05 PM   Specimen: Synovium; Body Fluid  Result Value Ref Range Status   Specimen Description SYNOVIAL RIGHT SHOULDER  Final   Special Requests NONE  Final   Gram Stain   Final    ABUNDANT WBC PRESENT, PREDOMINANTLY PMN NO ORGANISMS SEEN    Culture   Final    RARE METHICILLIN RESISTANT STAPHYLOCOCCUS AUREUS CRITICAL RESULT CALLED TO, READ BACK BY AND VERIFIED WITH: RN T.SHELTON AT 1211 ON 11/19/2020 BY T.SAAD Performed at [facility], [address] [place], [place] [address]    Report Status 11/21/2020 FINAL  Final   Organism ID, Bacteria METHICILLIN RESISTANT STAPHYLOCOCCUS AUREUS  Final  Susceptibility   Methicillin resistant staphylococcus aureus - MIC*    CIPROFLOXACIN <=0.5 SENSITIVE Sensitive     ERYTHROMYCIN >=8 RESISTANT Resistant     GENTAMICIN <=0.5 SENSITIVE Sensitive     OXACILLIN >=4 RESISTANT Resistant     TETRACYCLINE <=1 SENSITIVE Sensitive     VANCOMYCIN <=0.5 SENSITIVE Sensitive      TRIMETH/SULFA <=10 SENSITIVE Sensitive     CLINDAMYCIN <=0.25 SENSITIVE Sensitive     RIFAMPIN <=0.5 SENSITIVE Sensitive     Inducible Clindamycin NEGATIVE Sensitive     * RARE METHICILLIN RESISTANT STAPHYLOCOCCUS AUREUS  Culture, blood (Routine X 2) w Reflex to ID Panel     Status: None   Collection Time: 11/21/20  5:26 AM   Specimen: BLOOD RIGHT HAND  Result Value Ref Range Status   Specimen Description BLOOD RIGHT HAND  Final   Special Requests   Final    BOTTLES DRAWN AEROBIC ONLY Blood Culture adequate volume   Culture   Final    NO GROWTH 5 DAYS Performed at [facility], [address], [place], [place] [address]    Report Status 11/26/2020 FINAL  Final  Culture, blood (Routine X 2) w Reflex to ID Panel     Status: None   Collection Time: 11/21/20  5:26 AM   Specimen: BLOOD LEFT HAND  Result Value Ref Range Status   Specimen Description BLOOD LEFT HAND  Final   Special Requests   Final    BOTTLES DRAWN AEROBIC ONLY Blood Culture adequate volume   Culture   Final    NO GROWTH 5 DAYS Performed at [facility], [address], [place], [place] [address]    Report Status 11/26/2020 FINAL  Final  Aerobic/Anaerobic Culture w Gram Stain (surgical/deep wound)     Status: None   Collection Time: 12/06/20  8:39 AM   Specimen: PATH Other; Body Fluid  Result Value Ref Range Status   Specimen Description ABSCESS RIGHT SHOULDER ABSCESS SPEC A  Final   Special Requests RIGHT SHOULDER ABSCESS  Final   Gram Stain   Final    FEW WBC PRESENT, PREDOMINANTLY PMN RARE GRAM POSITIVE COCCI CORRECTED RESULTS PREVIOUSLY REPORTED AS: RARE YEAST CORRECTED RESULTS CALLED TO: Jettie Pagan PHARMD @1010  12/09/20 EB    Culture   Final    No growth aerobically or anaerobically. Performed at [facility], [address] [place], [place]    Report Status 12/11/2020 FINAL  Final  Culture, blood (routine x 2)     Status: None (Preliminary result)   Collection Time:  12/10/20 10:00 PM   Specimen: BLOOD RIGHT HAND  Result Value Ref Range Status   Specimen Description BLOOD RIGHT HAND  Final   Special Requests   Final    BOTTLES DRAWN AEROBIC AND ANAEROBIC Blood Culture adequate volume   Culture   Final    NO GROWTH < 24 HOURS Performed at [facility], [address] [place], [place]    Report Status PENDING  Incomplete  Culture, blood (routine x 2)     Status: None (Preliminary result)   Collection Time: 12/10/20 10:15 PM   Specimen: BLOOD LEFT HAND  Result Value Ref Range Status   Specimen Description BLOOD LEFT HAND  Final   Special Requests   Final    BOTTLES DRAWN AEROBIC AND ANAEROBIC Blood Culture adequate volume   Culture   Final    NO GROWTH < 24 HOURS Performed at  [facility], [address] [place], [place], [place] [address]    Report Status PENDING  Incomplete  Culture, Urine     Status: None   Collection Time: 12/11/20 10:38 AM   Specimen: Urine, Clean Catch  Result Value Ref Range Status   Specimen Description URINE, CLEAN CATCH  Final   Special Requests DAPTOMYCIN  Final   Culture   Final    NO GROWTH Performed at [facility], [address], [place], [place] [address]    Report Status 12/12/2020 FINAL  Final    Studies/Results: DG CHEST PORT 1 VIEW  Result Date: 12/10/2020 CLINICAL DATA:  Dyspnea EXAM: PORTABLE CHEST 1 VIEW COMPARISON:  11/25/2020 FINDINGS: Persistent bilateral consolidative and nodular pulmonary opacities with improved aeration compared to the prior study. Possible small left pleural effusion. No pneumothorax. Stable cardiomediastinal contours. Left PICC line tip overlies cavoatrial junction. IMPRESSION: Persistent bilateral pulmonary opacities with improvement in aeration. Electronically Signed   By: Guadlupe Spanish M.D.   On: 12/10/2020 15:55      Assessment/Plan:  INTERVAL HISTORY:  Cultures from the shoulder not yielding an organism yet but are undoubtedly due to  MRSA     Principal Problem:   Severe sepsis due to methicillin resistant Staphylococcus aureus (MRSA) with acute organ dysfunction (HCC) Active Problems:   Poor dentition   Malnutrition (HCC)   Thrombocytopenia (HCC)   Tobacco dependence   Acute cystitis without hematuria   Malnutrition of moderate degree   Abdominal pain   Left foot pain   Septic embolism (HCC)   Endocarditis of tricuspid valve   Staphylococcal arthritis of right shoulder (HCC)   MRSA bacteremia   Acute on chronic respiratory failure with hypoxia (HCC)   Sepsis (HCC)    Anne Bautista is a 35 y.o. female with IV drug use MRSA bacteremia tricuspid had about tricuspid valve endocarditis with septic emboli to the lungs right septic shoulder status post orthopedic surgery which disclosed all to pull subdeltoid abscesses within the right side.  She has had I&D.  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  I suspect we will have to give her long-acting oritavancin followed by oral antibiotics when she no longer is agreeable to staying in the hospital.  My partner Dr. Elinor Parkinson is avaiable for questions this weekend I will be back on Monday. We will only follow peripherally with intermittent check ins for now.   LOS: 26 days   Acey Lav 12/12/2020, 12:21 PM

## 2020-12-13 MED ORDER — LORAZEPAM 1 MG PO TABS
1.0000 mg | ORAL_TABLET | ORAL | Status: DC | PRN
  Administered 2020-12-18: 1 mg via ORAL
  Filled 2020-12-13: qty 1

## 2020-12-13 MED ORDER — ENOXAPARIN SODIUM 40 MG/0.4ML ~~LOC~~ SOLN
40.0000 mg | SUBCUTANEOUS | Status: DC
  Filled 2020-12-13 (×5): qty 0.4

## 2020-12-13 NOTE — Plan of Care (Signed)
  Problem: Fluid Volume: Goal: Hemodynamic stability will improve Outcome: Progressing   Problem: Respiratory: Goal: Ability to maintain adequate ventilation will improve Outcome: Progressing   Problem: Education: Goal: Knowledge of General Education information will improve Description: Including pain rating scale, medication(s)/side effects and non-pharmacologic comfort measures Outcome: Progressing   Problem: Activity: Goal: Risk for activity intolerance will decrease Outcome: Progressing   Problem: Nutrition: Goal: Adequate nutrition will be maintained Outcome: Progressing   Problem: Coping: Goal: Level of anxiety will decrease Outcome: Progressing

## 2020-12-13 NOTE — Progress Notes (Signed)
PROGRESS NOTE    Anne Bautista  OVA:[id number] DOB: 01-14-1986 DOA: 11/15/2020 PCP: Patient, No Pcp Per    Brief Narrative:  Anne Bautista was admitted to the hospital with a working diagnosis of tricuspid valve endocarditis with MRSA bacteremia complicated with severe sepsis, septic emboli to the lungs and kidneys.  Right shoulder septic arthritis, right subdeltoid abscess. Patient with prolonged hospitalization, plan to complete IV antibiotics inpatient until April 26.  35 year old female past medical history for intravenous drug abuse, who presented with cough and myalgias.  Positive right shoulder pain, associated with dyspnea and fever.  Ongoing symptoms for about 7 days.  Recent IV drug abuse about 3 days prior to hospitalization.  She was initially evaluated at [facility] then transferred to [facility] for further management.   Her vitals at the time of transfer include a blood pressure of 131/79, heart rate 124, respiratory rate 46, temperature 98.8, oxygen saturation 96%.  Heart S1-S2, present, tachycardic, lungs with scattered rhonchi bilaterally, increased work of breathing, abdomen soft non tender, positive track marks bilateral upper extremities and left lower extremity.  No edema.  She was diagnosed with sepsis and was admitted to the intensive care unit.  Her blood cultures were positive for MRSA, further work-up revealed tricuspid valve endocarditis.  She was found to have septic emboli to her lungs and kidneys.  During her hospitalization she was found to have right shoulder septic arthritis, underwent I&D 3/5.  Note patient has a PICC line which was placed 2/28, plan to continue antibiotic therapy with daptomycin till 01/19/2021.  Pending final decision about angio VAC debridement per CT surgery.   Right shoulder drain has been removed on 3.10. to remove sutures on until 03/17.   Assessment & Plan:   Principal Problem:   Endocarditis of tricuspid valve Active Problems:   Severe sepsis due  to methicillin resistant Staphylococcus aureus (MRSA) with acute organ dysfunction (HCC)   Poor dentition   Malnutrition (HCC)   Thrombocytopenia (HCC)   Tobacco dependence   Acute cystitis without hematuria   Malnutrition of moderate degree   Abdominal pain   Left foot pain   Septic embolism (HCC)   Staphylococcal arthritis of right shoulder (HCC)   MRSA bacteremia   Acute on chronic respiratory failure with hypoxia (HCC)   Sepsis (HCC)   1. Tricuspid valve MRSA endocarditis, complicated with bacteremia and severe sepsis. Right shoulder septic arthritis and right subdeltoid abscess. (present on admission).  Positive septic emboli to lungs and kidneys.  Right shoulder pain is well controlled with current analgesics. Continue to encourage mobility and out of bed to chair tid with meals.   Tolerating well antibiotic therapy with Daptomycin, plan to continue inpatient until 4.18 patient is a unsafe discharge due to hx of IV drug abuse.  Last Ck on 03/06 was 47  Patient with no chest pain, and oxygenation well and has remained afebrile.  Pain control with hydromorphone and oxycodone as needed.   2. Acute hypoxemic respiratory failure. Bilateral septic emboli. Patient with no dyspnea today, no chest pain.  Continue incentive spirometer and out of bed to chair.  3. 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 Today patient is calm and cooperative. Tolerating well methadone that will be continued at discharge.  On clonidine 0.1 mg bid. Lorazepam change from IV to po.  Thiamine 100 mg daily.   4. AKI with ATN/ hypokalemia and hypomagnesemia. Renal function has been improving, serum cr on 03/08 at 1,43 with K at 3,2 and bicarbonate  at 23. Mg was 1.8 on 03/07.  Patient has been tolerating po well.  Plan to follow up renal function and electrolytes in am.   5. Acute post operative blood loss anemia with iron deficiency . Hgb on 03.08 at 8,0 and hct at 24,2 Right shoulder drain has  been removed.   Reactive leukocytosis with wbc at 16,3 will follow cell count in am.   6. Moderate calorie protein malnutrition. Continue with nutritional supplementation   7. HTN. Continue blood pressure control with amlodipine.   8. Depression and anxiety. Continue with quetiapine.   Status is: Inpatient  Remains inpatient appropriate because:IV treatments appropriate due to intensity of illness or inability to take PO and Inpatient level of care appropriate due to severity of illness   Dispo: The patient is from: Home              Anticipated d/c is to: Home              Patient currently is not medically stable to d/c.   Difficult to place patient No   DVT prophylaxis: Enoxaparin   Code Status:   full  Family Communication:  No family at the bedside      Nutrition Status: Nutrition Problem: Moderate Malnutrition Etiology: social / environmental circumstances (IVDU) Signs/Symptoms: mild fat depletion,moderate fat depletion,mild muscle depletion,moderate muscle depletion Interventions: Ensure Enlive (each supplement provides 350kcal and 20 grams of protein),MVI     Consultants:   ID  CT surgery   Procedures:   Right shoulder I&D  Antimicrobials:   daptomycin     Subjective: Patient continue with right shoulder pain, controlled with analgesics, no nausea or vomiting, no chest pain or dyspnea,   Objective: Vitals:   12/12/20 2005 12/13/20 0004 12/13/20 0415 12/13/20 0900  BP: 140/79 (!) 107/58 (!) 152/91 136/78  Pulse: (!) 109 97 98 (!) 105  Resp: 18 17 17 18   Temp: 98.5 F (36.9 C) 98.8 F (37.1 C) 99 F (37.2 C) 99.4 F (37.4 C)  TempSrc: Oral Oral Oral Oral  SpO2: 97% 95% 98% 97%  Weight:      Height:        Intake/Output Summary (Last 24 hours) at 12/13/2020 1123 Last data filed at 12/12/2020 1800 Gross per 24 hour  Intake --  Output 800 ml  Net -800 ml   Filed Weights   11/30/20 0500 12/01/20 0328 12/02/20 0300  Weight: 77.6 kg 74 kg  75.2 kg    Examination:   General: Not in pain or dyspnea, deconditioned  Neurology: Awake and alert, non focal  E ENT: no pallor, no icterus, oral mucosa moist Cardiovascular: No JVD. S1-S2 present, rhythmic, no gallops, rubs, or murmurs. No lower extremity edema. Pulmonary: positive breath sounds bilaterally, adequate air movement, no wheezing, rhonchi or rales. Gastrointestinal. Abdomen soft and non tender Skin. No rashes Musculoskeletal: no joint deformities. Mild pain to palpation right shoulder.      Data Reviewed: I have personally reviewed following labs and imaging studies  CBC: Recent Labs  Lab 12/07/20 0500 12/07/20 1812 12/08/20 0500 12/09/20 0414  WBC 8.6  --  11.2* 16.3*  HGB 6.0* 7.1* 6.6* 8.0*  HCT 19.8* 22.6* 20.8* 24.2*  MCV 85.0  --  83.5 83.4  PLT 288  --  315 338   Basic Metabolic Panel: Recent Labs  Lab 12/07/20 0500 12/08/20 0500 12/09/20 0414  NA 136 136 135  K 4.0 3.5 3.2*  CL 103 104 103  CO2 23 22  23  GLUCOSE 107* 103* 108*  BUN 33* 22* 16  CREATININE 1.96* 1.70* 1.43*  CALCIUM 8.1* 8.1* 8.3*  MG  --  1.8  --   PHOS  --  3.7  --    GFR: Estimated Creatinine Clearance: 57.5 mL/min (A) (by C-G formula based on SCr of 1.43 mg/dL (H)). Liver Function Tests: Recent Labs  Lab 12/07/20 0500 12/08/20 0500  AST 20 20  ALT 19 16  ALKPHOS 65 72  BILITOT 0.5 0.4  PROT 8.1 8.0  ALBUMIN 1.6* 1.8*   No results for input(s): LIPASE, AMYLASE in the last 168 hours. No results for input(s): AMMONIA in the last 168 hours. Coagulation Profile: No results for input(s): INR, PROTIME in the last 168 hours. Cardiac Enzymes: Recent Labs  Lab 12/07/20 0500  CKTOTAL 47   BNP (last 3 results) No results for input(s): PROBNP in the last 8760 hours. HbA1C: No results for input(s): HGBA1C in the last 72 hours. CBG: Recent Labs  Lab 12/09/20 0729 12/09/20 1216 12/09/20 1752 12/09/20 2114 12/10/20 0609  GLUCAP 132* 113* 87 107* 101*    Lipid Profile: No results for input(s): CHOL, HDL, LDLCALC, TRIG, CHOLHDL, LDLDIRECT in the last 72 hours. Thyroid Function Tests: No results for input(s): TSH, T4TOTAL, FREET4, T3FREE, THYROIDAB in the last 72 hours. Anemia Panel: No results for input(s): VITAMINB12, FOLATE, FERRITIN, TIBC, IRON, RETICCTPCT in the last 72 hours.    Radiology Studies: I have reviewed all of the imaging during this hospital visit personally     Scheduled Meds: . amLODipine  10 mg Oral Daily  . chlorhexidine  15 mL Mouth Rinse BID  . Chlorhexidine Gluconate Cloth  6 each Topical Daily  . cloNIDine  0.1 mg Oral BID  . dicyclomine  20 mg Oral TID AC & HS  . docusate sodium  100 mg Oral BID  . feeding supplement  237 mL Oral QID  . folic acid  1 mg Oral Daily  . heparin  5,000 Units Subcutaneous Q8H  . mouth rinse  15 mL Mouth Rinse q12n4p  . methadone  10 mg Oral Q8H  . multivitamin with minerals  1 tablet Oral Daily  . pantoprazole  40 mg Oral Daily  . polyethylene glycol  17 g Oral BID  . QUEtiapine  25 mg Oral BID  . senna  2 tablet Oral Daily  . sodium chloride flush  10-40 mL Intracatheter Q12H  . thiamine  100 mg Oral Daily   Continuous Infusions: . sodium chloride Stopped (12/05/20 0656)  . DAPTOmycin (CUBICIN)  IV 750 mg (12/12/20 2118)  . lactated ringers 10 mL/hr at 12/06/20 0730     LOS: 27 days        Darene Nappi Annett Gula, MD

## 2020-12-13 NOTE — Plan of Care (Signed)
  Problem: Education: Goal: Knowledge of secondary prevention will improve Outcome: Progressing   

## 2020-12-14 LAB — CBC WITH DIFFERENTIAL/PLATELET
Abs Immature Granulocytes: 0.05 10*3/uL (ref 0.00–0.07)
Basophils Absolute: 0.1 10*3/uL (ref 0.0–0.1)
Basophils Relative: 1 %
Eosinophils Absolute: 0.1 10*3/uL (ref 0.0–0.5)
Eosinophils Relative: 1 %
HCT: 22.6 % — ABNORMAL LOW (ref 36.0–46.0)
Hemoglobin: 7.3 g/dL — ABNORMAL LOW (ref 12.0–15.0)
Immature Granulocytes: 1 %
Lymphocytes Relative: 32 %
Lymphs Abs: 3.6 10*3/uL (ref 0.7–4.0)
MCH: 27.2 pg (ref 26.0–34.0)
MCHC: 32.3 g/dL (ref 30.0–36.0)
MCV: 84.3 fL (ref 80.0–100.0)
Monocytes Absolute: 0.9 10*3/uL (ref 0.1–1.0)
Monocytes Relative: 8 %
Neutro Abs: 6.5 10*3/uL (ref 1.7–7.7)
Neutrophils Relative %: 57 %
Platelets: 331 10*3/uL (ref 150–400)
RBC: 2.68 MIL/uL — ABNORMAL LOW (ref 3.87–5.11)
RDW: 17.7 % — ABNORMAL HIGH (ref 11.5–15.5)
WBC: 11.1 10*3/uL — ABNORMAL HIGH (ref 4.0–10.5)
nRBC: 0 % (ref 0.0–0.2)

## 2020-12-14 LAB — BASIC METABOLIC PANEL
Anion gap: 7 (ref 5–15)
BUN: 9 mg/dL (ref 6–20)
CO2: 25 mmol/L (ref 22–32)
Calcium: 8.2 mg/dL — ABNORMAL LOW (ref 8.9–10.3)
Chloride: 105 mmol/L (ref 98–111)
Creatinine, Ser: 1.21 mg/dL — ABNORMAL HIGH (ref 0.44–1.00)
GFR, Estimated: 60 mL/min — ABNORMAL LOW (ref >60)
Glucose, Bld: 102 mg/dL — ABNORMAL HIGH (ref 70–99)
Potassium: 3.2 mmol/L — ABNORMAL LOW (ref 3.5–5.1)
Sodium: 137 mmol/L (ref 135–145)

## 2020-12-14 LAB — MAGNESIUM: Magnesium: 1.7 mg/dL (ref 1.7–2.4)

## 2020-12-14 LAB — CK: Total CK: 13 U/L — ABNORMAL LOW (ref 38–234)

## 2020-12-14 MED ORDER — MAGNESIUM SULFATE 2 GM/50ML IV SOLN
2.0000 g | Freq: Once | INTRAVENOUS | Status: AC
  Administered 2020-12-14: 2 g via INTRAVENOUS
  Filled 2020-12-14: qty 50

## 2020-12-14 MED ORDER — POTASSIUM CHLORIDE CRYS ER 20 MEQ PO TBCR
40.0000 meq | EXTENDED_RELEASE_TABLET | Freq: Once | ORAL | Status: AC
  Administered 2020-12-14: 40 meq via ORAL
  Filled 2020-12-14: qty 2

## 2020-12-14 NOTE — Progress Notes (Signed)
Ok to give KCL PO x1 for K+ 3.2 per Dr. Ella Jubilee.  Ulyses Southward, PharmD, BCIDP, AAHIVP, CPP Infectious Disease Pharmacist 12/14/2020 9:08 AM

## 2020-12-14 NOTE — Progress Notes (Signed)
PROGRESS NOTE    Anne Bautista  WUJ:[id number] DOB: Jun 13, 1986 DOA: 11/15/2020 PCP: Patient, No Pcp Per    Brief Narrative:  Anne Bautista was admitted to the hospital with a working diagnosis of tricuspid valve endocarditis with MRSA bacteremia complicated with severe sepsis, septic emboli to the lungs and kidneys.  Right shoulder septic arthritis, right subdeltoid abscess. Patient with prolonged hospitalization, plan to complete IV antibiotics inpatient until April 27.  35 year old female past medical history for intravenous drug abuse, who presented with cough and myalgias.  Positive right shoulder pain, associated with dyspnea and fever.  Ongoing symptoms for about 7 days.  Recent IV drug abuse about 3 days prior to hospitalization.  She was initially evaluated at [facility] then transferred to [facility] for further management.   Her vitals at the time of transfer include a blood pressure of 131/79, heart rate 124, respiratory rate 46, temperature 98.8, oxygen saturation 96%.  Heart S1-S2, present, tachycardic, lungs with scattered rhonchi bilaterally, increased work of breathing, abdomen soft non tender, positive track marks bilateral upper extremities and left lower extremity.  No edema.  She was diagnosed with sepsis and was admitted to the intensive care unit.  Her blood cultures were positive for MRSA, further work-up revealed tricuspid valve endocarditis.  She was found to have septic emboli to her lungs and kidneys.  During her hospitalization she was found to have right shoulder septic arthritis, underwent I&D 3/5.  Note patient has a PICC line which was placed 2/28, plan to continue antibiotic therapy with daptomycin till 01/19/2021.  Pending final decision about angio VAC debridement per CT surgery.   Right shoulder drain has been removed on 3.10. to remove sutures on until 03/17.   Assessment & Plan:   Assessment & Plan:   Principal Problem:   Endocarditis of tricuspid valve Active  Problems:   Severe sepsis due to methicillin resistant Staphylococcus aureus (MRSA) with acute organ dysfunction (HCC)   Poor dentition   Malnutrition (HCC)   Thrombocytopenia (HCC)   Tobacco dependence   Acute cystitis without hematuria   Malnutrition of moderate degree   Abdominal pain   Left foot pain   Septic embolism (HCC)   Staphylococcal arthritis of right shoulder (HCC)   MRSA bacteremia   Acute on chronic respiratory failure with hypoxia (HCC)   Sepsis (HCC)    1. Tricuspid valve MRSA endocarditis, complicated with bacteremia (disseminated MRSA infection) and severe sepsis. Right shoulder septic arthritis and right subdeltoid abscess. (present on admission).  Positive septic emboli to lungs and kidneys.  She is seating at the edge of the bed today. Her shoulder pain is well controlled with current analgesics.   Continue with Daptomycin, plan to continue inpatient until 01/19/21  Considered patient not unsafe for home discharge with IV antibiotics due to history of IV drug abuse. Ck on 03/06 was 47  2. Acute hypoxemic respiratory failure. Bilateral septic emboli. Patient with no dyspnea today, no chest pain.  On incentive spirometer and out of bed to chair. Continue to encourage mobility.   3. 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  Continue with methadone with good toleration.  Clonidine 0.1 mg bid. Lorazepam change from IV to po, and Thiamine 100 mg daily.   4. AKI with ATN/ hypokalemia and hypomagnesemia.  Today renal function with serum cr at 1,21 with K at 3,2 and bicarbonate at 25. Mg is 1,7.  Add 40 meq Kcl and 2 g Mag sulfate.   Patient is tolerating po well.  5. Acute post operative blood loss anemia with iron deficiency Right shoulder drain has been removed.   Follow up hgb is 7.3 and hct 22.6.   Cell count is down to 11,1   6. Moderate calorie protein malnutrition. On nutritional supplementation   7. HTN. On amlodipine for blood  pressure control.   8. Depression and anxiety. On quetiapine.    Status is: Inpatient  Remains inpatient appropriate because:IV treatments appropriate due to intensity of illness or inability to take PO   Dispo: The patient is from: Home              Anticipated d/c is to: Home              Patient currently is not medically stable to d/c.   Difficult to place patient No  DVT prophylaxis: Enoxaparin   Code Status:   full  Family Communication:  No family at the bedside      Nutrition Status: Nutrition Problem: Moderate Malnutrition Etiology: social / environmental circumstances (IVDU) Signs/Symptoms: mild fat depletion,moderate fat depletion,mild muscle depletion,moderate muscle depletion Interventions: Ensure Enlive (each supplement provides 350kcal and 20 grams of protein),MVI    Consultants:   ID  CT surgery   Procedures:   Right shoulder I&D  Antimicrobials:   daptomycin      Subjective: Patient is feeling well, right shoulder pain is controlled, no nausea or vomiting, no chest pain or dyspnea,   Objective: Vitals:   12/13/20 1931 12/13/20 2357 12/14/20 0349 12/14/20 0855  BP: 134/88 120/69 (!) 155/74 133/78  Pulse: (!) 109 (!) 103 (!) 101 (!) 103  Resp: 18 18 16 18   Temp: 98.8 F (37.1 C) 99.9 F (37.7 C) (!) 100.4 F (38 C) 100.2 F (37.9 C)  TempSrc: Oral Oral Oral Oral  SpO2: 98% 95% 96% 96%  Weight:      Height:        Intake/Output Summary (Last 24 hours) at 12/14/2020 1356 Last data filed at 12/14/2020 12/16/2020 Gross per 24 hour  Intake 440 ml  Output --  Net 440 ml   Filed Weights   11/30/20 0500 12/01/20 0328 12/02/20 0300  Weight: 77.6 kg 74 kg 75.2 kg    Examination:   General: Not in pain or dyspnea, deconditioned  Neurology: Awake and alert, non focal  E ENT: mild pallor, no icterus, oral mucosa moist Cardiovascular: No JVD. S1-S2 present, rhythmic, no gallops, rubs, or murmurs. No lower extremity edema. Pulmonary:  positive breath sounds bilaterally, adequate air movement, no wheezing, rhonchi or rales. Gastrointestinal. Abdomen soft and non tender Skin. No rashes Musculoskeletal: no joint deformities     Data Reviewed: I have personally reviewed following labs and imaging studies  CBC: Recent Labs  Lab 12/07/20 1812 12/08/20 0500 12/09/20 0414 12/14/20 0420  WBC  --  11.2* 16.3* 11.1*  NEUTROABS  --   --   --  6.5  HGB 7.1* 6.6* 8.0* 7.3*  HCT 22.6* 20.8* 24.2* 22.6*  MCV  --  83.5 83.4 84.3  PLT  --  315 338 331   Basic Metabolic Panel: Recent Labs  Lab 12/08/20 0500 12/09/20 0414 12/14/20 0420  NA 136 135 137  K 3.5 3.2* 3.2*  CL 104 103 105  CO2 22 23 25   GLUCOSE 103* 108* 102*  BUN 22* 16 9  CREATININE 1.70* 1.43* 1.21*  CALCIUM 8.1* 8.3* 8.2*  MG 1.8  --  1.7  PHOS 3.7  --   --  GFR: Estimated Creatinine Clearance: 68 mL/min (A) (by C-G formula based on SCr of 1.21 mg/dL (H)). Liver Function Tests: Recent Labs  Lab 12/08/20 0500  AST 20  ALT 16  ALKPHOS 72  BILITOT 0.4  PROT 8.0  ALBUMIN 1.8*   No results for input(s): LIPASE, AMYLASE in the last 168 hours. No results for input(s): AMMONIA in the last 168 hours. Coagulation Profile: No results for input(s): INR, PROTIME in the last 168 hours. Cardiac Enzymes: Recent Labs  Lab 12/14/20 0420  CKTOTAL 13*   BNP (last 3 results) No results for input(s): PROBNP in the last 8760 hours. HbA1C: No results for input(s): HGBA1C in the last 72 hours. CBG: Recent Labs  Lab 12/09/20 0729 12/09/20 1216 12/09/20 1752 12/09/20 2114 12/10/20 0609  GLUCAP 132* 113* 87 107* 101*   Lipid Profile: No results for input(s): CHOL, HDL, LDLCALC, TRIG, CHOLHDL, LDLDIRECT in the last 72 hours. Thyroid Function Tests: No results for input(s): TSH, T4TOTAL, FREET4, T3FREE, THYROIDAB in the last 72 hours. Anemia Panel: No results for input(s): VITAMINB12, FOLATE, FERRITIN, TIBC, IRON, RETICCTPCT in the last 72  hours.    Radiology Studies: I have reviewed all of the imaging during this hospital visit personally     Scheduled Meds: . amLODipine  10 mg Oral Daily  . chlorhexidine  15 mL Mouth Rinse BID  . Chlorhexidine Gluconate Cloth  6 each Topical Daily  . cloNIDine  0.1 mg Oral BID  . dicyclomine  20 mg Oral TID AC & HS  . docusate sodium  100 mg Oral BID  . enoxaparin (LOVENOX) injection  40 mg Subcutaneous Q24H  . feeding supplement  237 mL Oral QID  . folic acid  1 mg Oral Daily  . mouth rinse  15 mL Mouth Rinse q12n4p  . methadone  10 mg Oral Q8H  . multivitamin with minerals  1 tablet Oral Daily  . pantoprazole  40 mg Oral Daily  . polyethylene glycol  17 g Oral BID  . QUEtiapine  25 mg Oral BID  . senna  2 tablet Oral Daily  . sodium chloride flush  10-40 mL Intracatheter Q12H  . thiamine  100 mg Oral Daily   Continuous Infusions: . sodium chloride Stopped (12/05/20 0656)  . DAPTOmycin (CUBICIN)  IV 750 mg (12/13/20 2224)  . lactated ringers 10 mL/hr at 12/06/20 0730     LOS: 28 days        Anne Bautista Annett Gula, MD

## 2020-12-14 NOTE — Progress Notes (Signed)
Pharmacy Antibiotic Note  Anne Bautista is a 35 y.o. female admitted on 11/15/2020 with Sepsis 2/2 MRSA IE with septic emboli to both lungs, kidneys, R shoulder and possibly L ankle.,.  Pharmacy has been consulted for Daptomycin dosing.  ID: Sepsis 2/2 MRSA IE with septic emboli to both lungs, kidneys, R shoulder and possibly L ankle., CRP 19, ESR 112 -CT AP septic emboli,  LA 3.1>0.8, CK 47, CRP elevated, PC wNL -2/14 ECHO with 2x1 cm vegetation, trivial regurg, f/u TEE -R shoulder synovial fluid WBCs 8250 Wbc came back at 11.1. Cont to have low grade fever (Tm 100.4). CK remains within range.   Cefepime 2/12 >> 2/13 Vancomycin 2/12 >> 2/16 Dapto 2/16>>4/18 Doxy 2/16>>2/20  2/17: CK 280 2/20: CK 45 2/27: CK 23 3/13: CK 13  2/12Bcx: 3/3 MRSA  2/12 COVIDPCR:neg 2/12 UCx: > 100K MRSA 2/14 Bcx: 1/4 MRSA 2/14 R shoulder fluid: MRSA 2/18 Bcx negative 3/5 Wcx: rare GPC 3/9 BCx >> ngtd 3/9 UCx >>negF  Plan: -Dapto for MRSA TV IE 11m/kg/24h -CK weekly on Sunday  Height: 5' 7" (170.2 cm) Weight:  (Pt refused) IBW/kg (Calculated) : 61.6  Temp (24hrs), Avg:99.6 F (37.6 C), Min:98.8 F (37.1 C), Max:100.4 F (38 C)  Recent Labs  Lab 12/08/20 0500 12/09/20 0414 12/14/20 0420  WBC 11.2* 16.3* 11.1*  CREATININE 1.70* 1.43* 1.21*    Estimated Creatinine Clearance: 68 mL/min (A) (by C-G formula based on SCr of 1.21 mg/dL (H)).    No Known Allergies  MOnnie Boer PharmD, BCIDP, AAHIVP, CPP Infectious Disease Pharmacist 12/14/2020 8:51 AM

## 2020-12-15 ENCOUNTER — Inpatient Hospital Stay (HOSPITAL_COMMUNITY): Payer: Self-pay

## 2020-12-15 DIAGNOSIS — I38 Endocarditis, valve unspecified: Secondary | ICD-10-CM

## 2020-12-15 LAB — ECHOCARDIOGRAM LIMITED
Calc EF: 55.7 %
Height: 67 in
Single Plane A2C EF: 44.8 %
Single Plane A4C EF: 65.3 %
Weight: 2652.57 oz

## 2020-12-15 LAB — CULTURE, BLOOD (ROUTINE X 2)
Culture: NO GROWTH
Culture: NO GROWTH
Special Requests: ADEQUATE
Special Requests: ADEQUATE

## 2020-12-15 MED ORDER — ASPIRIN-ACETAMINOPHEN-CAFFEINE 250-250-65 MG PO TABS
2.0000 | ORAL_TABLET | Freq: Four times a day (QID) | ORAL | Status: DC | PRN
  Filled 2020-12-15: qty 2

## 2020-12-15 NOTE — Progress Notes (Signed)
[facility] PROGRESS NOTE  Anne Bautista GEX:[id number] DOB: June 17, 1986 DOA: 11/15/2020 PCP: Patient, No Pcp Per  Status:  Remains inpatient appropriate because:Unsafe d/c plan and IV treatments appropriate due to intensity of illness or inability to take PO   Dispo: The patient is from: Home              Anticipated d/c is to: SNF recommended by PT due to physical debility              Barriers to discharge: Lack of funding-currently Medicaid potential, history of substance abuse.  Of note prior to admission patient was residing at the [facility].  Patient reports she will live with her mother in [place] after discharge.              Patient currently is not medically stable to d/c.  Patient will remain on IV daptomycin until 4/18.  We also need clarification regarding possibility of valvular angio back surgery during hospitalization   Difficult to place patient No   Level of care: Med-Surg  Code Status: Full Family Communication: Patient only DVT prophylaxis: SQ Heparin Vaccination status: Unknown  Foley catheter: No  HPI: 35 year old female with history of IV drug use, tobacco use, depression came to the hospital with myalgias and cough, he was septic on arrival admitted to the ICU.  She was found to have MRSA bacteremia, tricuspid valve endocarditis as well as septic emboli to lungs, kidneys.  Infectious disease and cardiothoracic surgery consulted.  Hospital course complicated by worsening right shoulder pain found to have septic arthritis, with cultures showing yeast   Subjective: Awakened.  Was covered up in blankets secondary to sensation of feeling cold.  During about nausea over the weekend she said it was related to a migraine.  She states she normally takes Excedrin Migraine for her headaches.  Objective: Vitals:   12/15/20 0500 12/15/20 0700  BP: 138/77 119/72  Pulse: (!) 102 97  Resp: 18 17  Temp: 100 F (37.8 C) 99.5 F (37.5 C)   SpO2: 95% 96%    Intake/Output Summary (Last 24 hours) at 12/15/2020 0802 Last data filed at 12/15/2020 0538 Gross per 24 hour  Intake 210.91 ml  Output --  Net 210.91 ml   Filed Weights   11/30/20 0500 12/01/20 0328 12/02/20 0300  Weight: 77.6 kg 74 kg 75.2 kg    Exam:  Constitutional: Calm, no acute distress Respiratory: Bilateral lung sounds are clear to auscultation, respiratory effort nonlabored and she is stable on room air Cardiovascular: No JVD, pulse regular, no peripheral edema.  Grade 3/5 systolic murmur left sternal border fourth intercostal space.  Abdomen: Soft nontender nondistended with normoactive bowel sounds. LBM 3/9 Musculoskeletal: no clubbing / cyanosis. No joint deformity upper and lower extremities. Good ROM, no contractures. Normal muscle tone.  Neurologic: CN 2-12 grossly intact.  Currently denies migraine headache.  Moves all extremities x4. Psychiatric: Alert and oriented x3, no acute distress   Assessment/Plan: Acute problems: Severe sepsis due to MRSA bacteremia/ -Complicated by septic emboli to both lungs and kidneys, right shoulder possibly septic left ankle (MRI refused by patient).  She also has a right subdeltoid abscess. Left ankle improving, no further issues, no pain -ID, cardiothoracic surgery, orthopedic surgery following -Status post incision and drainage of right shoulder subdeltoid abscess with multiple cavities on 3/5 by Dr. Susa Simmonds. Drain in place, removal per ortho  -Continue daptomycin until 01/19/2021, monitor CK. Shoulder cultures revealed yeast so started  on anidulafungin but culture result corrected as rare gram-positive cocci so antifungal medication discontinued-final cultures pending -Last blood cultures negative on 2/18, has a PICC line since 2/28.  Tricuspid valve endocarditis -Cardiothoracic surgery evaluated patient early in the hospitalization for possible angio VAC debridement.  -Discussed with cardiac surgeon Dr. Cliffton Asters  via secure chat on 3/14.  Recommends repeating echocardiogram given initial vegetation quite sessile in appearance -Recent chest x-ray unremarkable no evidence of heart failure.  BNP stable in the 450 range  Septic emboli to left ankle and right shoulder -Status post drainage of right subdeltoid abscess -Drain removed by orthopedic team on 3/10 with documentation that sutures to remain in place for an additional week  Migraine headache -Patient reports recent nausea secondary to migraine holocord of the weekend and normally talks Excedrin Migraine which has been ordered as of 3/14.  Fever -Has been having waxing and waning febrile state throughout the admission without any new sources of infection determined.  IV drug use w/ Heroin, cocaine use, marijuana, tobacco  -cessation was encouraged.   -UDS was positive for cocaine.   -Hep C and HIV are negative.  RPR is negative. -Is on methadone and she would like to continue this after discharge.  Did not wish to transition to Suboxone after discharge.  Acute hypoxic respiratory failure, narcotic withdrawal, bilateral pulmonary septic emboli  -hypoxia resolved, remains on room air   Acute blood loss anemia, iron deficiency anemia  -post operatively, received a total of 2U pRBC, one on 3/6 and one on 3/7  Nonoliguric AKI with ATN  -creatinine peaked at 2.8, fluctuating but overall stable, 1.4 today   Debility/deconditioning PT  NOTE 3/8: Pt with better color and energy level today than yesterday. Resistant to getting up at first but easily encouraged. Pt worked on sitting and standing balance exercises with balloon that therapist brought. Pt then agreeable to ambulation out of room. With use of RW pt able to ambulate 56' with min-guard A. C/o B ankle pain by end of ambulation and discussed ROM and strengthening for B ankles. PT will continue to follow. On 3/11 patient refused PT given severe nausea likely related to migraine and was unable  to actively participate in therapy. OT NOTE 3/8: pt reports pain from HA declining OT session, will check back as time allows.  Moderate protein calorie malnutrition Nutrition Problem: Moderate Malnutrition Etiology: social / environmental circumstances (IVDU) Signs/Symptoms: mild fat depletion,moderate fat depletion,mild muscle depletion,moderate muscle depletion Interventions: Ensure Enlive (each supplement provides 350kcal and 20 grams of protein),MVI Estimated body mass index is 25.97 kg/m as calculated from the following:   Height as of this encounter: 5\' 7"  (1.702 m).   Weight as of this encounter: 75.2 kg.   Wounds: Incision (Closed) 12/06/20 Shoulder Right (Active)  Date First Assessed/Time First Assessed: 12/06/20 0914   Location: Shoulder  Location Orientation: Right    Assessments 12/06/2020  8:56 AM 12/15/2020  8:00 AM  Dressing Type Adhesive bandage Gauze (Comment)  Dressing Clean;Dry;Intact Clean;Dry;Intact  Site / Wound Assessment Dressing in place / Unable to assess Clean;Dry;Dressing in place / Unable to assess  Drainage Amount None None     No Linked orders to display     Other problems: Hypertension -continue current regimen  Acute cystitis without hematuria  -MRSA in cultures, she is on antibiotics  Data Reviewed: Basic Metabolic Panel: Recent Labs  Lab 12/09/20 0414 12/14/20 0420  NA 135 137  K 3.2* 3.2*  CL 103 105  CO2 23  25  GLUCOSE 108* 102*  BUN 16 9  CREATININE 1.43* 1.21*  CALCIUM 8.3* 8.2*  MG  --  1.7   Liver Function Tests: No results for input(s): AST, ALT, ALKPHOS, BILITOT, PROT, ALBUMIN in the last 168 hours. No results for input(s): LIPASE, AMYLASE in the last 168 hours. No results for input(s): AMMONIA in the last 168 hours. CBC: Recent Labs  Lab 12/09/20 0414 12/14/20 0420  WBC 16.3* 11.1*  NEUTROABS  --  6.5  HGB 8.0* 7.3*  HCT 24.2* 22.6*  MCV 83.4 84.3  PLT 338 331   Cardiac Enzymes: Recent Labs  Lab  12/14/20 0420  CKTOTAL 13*   BNP (last 3 results) Recent Labs    11/27/20 0430 11/28/20 0505 12/10/20 1600  BNP 670.1* 455.5* 458.5*    ProBNP (last 3 results) No results for input(s): PROBNP in the last 8760 hours.  CBG: Recent Labs  Lab 12/09/20 0729 12/09/20 1216 12/09/20 1752 12/09/20 2114 12/10/20 0609  GLUCAP 132* 113* 87 107* 101*    Recent Results (from the past 240 hour(s))  Aerobic/Anaerobic Culture w Gram Stain (surgical/deep wound)     Status: None   Collection Time: 12/06/20  8:39 AM   Specimen: PATH Other; Body Fluid  Result Value Ref Range Status   Specimen Description ABSCESS RIGHT SHOULDER ABSCESS SPEC A  Final   Special Requests RIGHT SHOULDER ABSCESS  Final   Gram Stain   Final    FEW WBC PRESENT, PREDOMINANTLY PMN RARE GRAM POSITIVE COCCI CORRECTED RESULTS PREVIOUSLY REPORTED AS: RARE YEAST CORRECTED RESULTS CALLED TO: Jettie Pagan PHARMD @1010  12/09/20 EB    Culture   Final    No growth aerobically or anaerobically. Performed at [facility], [address], [place], [place]    Report Status 12/11/2020 FINAL  Final  Culture, blood (routine x 2)     Status: None (Preliminary result)   Collection Time: 12/10/20 10:00 PM   Specimen: BLOOD RIGHT HAND  Result Value Ref Range Status   Specimen Description BLOOD RIGHT HAND  Final   Special Requests   Final    BOTTLES DRAWN AEROBIC AND ANAEROBIC Blood Culture adequate volume   Culture   Final    NO GROWTH 4 DAYS Performed at [facility], [address] [place], [place]    Report Status PENDING  Incomplete  Culture, blood (routine x 2)     Status: None (Preliminary result)   Collection Time: 12/10/20 10:15 PM   Specimen: BLOOD LEFT HAND  Result Value Ref Range Status   Specimen Description BLOOD LEFT HAND  Final   Special Requests   Final    BOTTLES DRAWN AEROBIC AND ANAEROBIC Blood Culture adequate volume   Culture   Final    NO GROWTH 4 DAYS Performed  at [facility], [address] [place], [place]    Report Status PENDING  Incomplete  Culture, Urine     Status: None   Collection Time: 12/11/20 10:38 AM   Specimen: Urine, Clean Catch  Result Value Ref Range Status   Specimen Description URINE, CLEAN CATCH  Final   Special Requests DAPTOMYCIN  Final   Culture   Final    NO GROWTH Performed at [facility], [address] [place], [place]    Report Status 12/12/2020 FINAL  Final     Studies: No results found.  Scheduled Meds: . amLODipine  10 mg Oral Daily  .  chlorhexidine  15 mL Mouth Rinse BID  . Chlorhexidine Gluconate Cloth  6 each Topical Daily  . cloNIDine  0.1 mg Oral BID  . dicyclomine  20 mg Oral TID AC & HS  . docusate sodium  100 mg Oral BID  . enoxaparin (LOVENOX) injection  40 mg Subcutaneous Q24H  . feeding supplement  237 mL Oral QID  . folic acid  1 mg Oral Daily  . mouth rinse  15 mL Mouth Rinse q12n4p  . methadone  10 mg Oral Q8H  . multivitamin with minerals  1 tablet Oral Daily  . pantoprazole  40 mg Oral Daily  . polyethylene glycol  17 g Oral BID  . QUEtiapine  25 mg Oral BID  . senna  2 tablet Oral Daily  . sodium chloride flush  10-40 mL Intracatheter Q12H  . thiamine  100 mg Oral Daily   Continuous Infusions: . sodium chloride Stopped (12/05/20 0656)  . DAPTOmycin (CUBICIN)  IV 750 mg (12/14/20 2055)  . lactated ringers 10 mL/hr at 12/06/20 0730    Principal Problem:   Endocarditis of tricuspid valve Active Problems:   Severe sepsis due to methicillin resistant Staphylococcus aureus (MRSA) with acute organ dysfunction (HCC)   Poor dentition   Malnutrition (HCC)   Thrombocytopenia (HCC)   Tobacco dependence   Acute cystitis without hematuria   Malnutrition of moderate degree   Abdominal pain   Left foot pain   Septic embolism (HCC)   Staphylococcal arthritis of right shoulder (HCC)   MRSA bacteremia   Acute on chronic respiratory failure with  hypoxia (HCC)   Sepsis (HCC)   Consultants: Infectious disease Cardiothoracic surgery Orthopedic surgery  Procedures:  2D echocardiogram  Anesthesia on 3/3  Irrigation and debridement right shoulder abscess on 3/5  Antibiotics: Anti-infectives (From admission, onward)   Start     Dose/Rate Route Frequency Ordered Stop   12/12/20 2000  DAPTOmycin (CUBICIN) 750 mg in sodium chloride 0.9 % IVPB        750 mg 230 mL/hr over 30 Minutes Intravenous Daily 12/12/20 0719 01/19/21 2359   12/09/20 1200  anidulafungin (ERAXIS) 100 mg in sodium chloride 0.9 % 100 mL IVPB  Status:  Discontinued        100 mg 78 mL/hr over 100 Minutes Intravenous Every 24 hours 12/08/20 1109 12/09/20 1013   12/08/20 1200  anidulafungin (ERAXIS) 200 mg in sodium chloride 0.9 % 200 mL IVPB        200 mg 78 mL/hr over 200 Minutes Intravenous  Once 12/08/20 1109 12/08/20 1724   12/06/20 0833  vancomycin (VANCOCIN) powder  Status:  Discontinued          As needed 12/06/20 0834 12/06/20 0852   12/06/20 0600  ceFAZolin (ANCEF) IVPB 2g/100 mL premix        2 g 200 mL/hr over 30 Minutes Intravenous On call to O.R. 12/06/20 0306 12/06/20 0755   11/28/20 2000  DAPTOmycin (CUBICIN) 764 mg in sodium chloride 0.9 % IVPB  Status:  Discontinued        10 mg/kg  76.4 kg 230.6 mL/hr over 30 Minutes Intravenous Daily 11/28/20 0727 12/12/20 0719   11/24/20 2000  DAPTOmycin (CUBICIN) 827 mg in sodium chloride 0.9 % IVPB  Status:  Discontinued        10 mg/kg  82.7 kg 233.1 mL/hr over 30 Minutes Intravenous Daily 11/24/20 0821 11/28/20 0727   11/19/20 1000  DAPTOmycin (CUBICIN) 770 mg in sodium chloride 0.9 % IVPB  Status:  Discontinued        10 mg/kg  77 kg 230.8 mL/hr over 30 Minutes Intravenous Daily 11/19/20 0908 11/24/20 0821   11/19/20 1000  doxycycline (VIBRA-TABS) tablet 100 mg        100 mg Oral Every 12 hours 11/19/20 0908 11/23/20 2123   11/19/20 0439  vancomycin variable dose per unstable renal function  (pharmacist dosing)  Status:  Discontinued         Does not apply See admin instructions 11/19/20 0439 11/19/20 0853   11/16/20 2000  vancomycin (VANCOREADY) IVPB 1250 mg/250 mL  Status:  Discontinued        1,250 mg 166.7 mL/hr over 90 Minutes Intravenous Every 24 hours 11/15/20 1933 11/16/20 1108   11/16/20 1200  vancomycin (VANCOCIN) IVPB 1000 mg/200 mL premix  Status:  Discontinued        1,000 mg 200 mL/hr over 60 Minutes Intravenous Every 12 hours 11/16/20 1108 11/19/20 0439   11/16/20 0745  metroNIDAZOLE (FLAGYL) IVPB 500 mg        500 mg 100 mL/hr over 60 Minutes Intravenous  Once 11/16/20 0741 11/16/20 0907   11/15/20 2237  vancomycin (VANCOCIN) 500 MG powder       Note to Pharmacy: Ihor Dow   : cabinet override      11/15/20 2237 11/16/20 1044   11/15/20 2000  ceFEPIme (MAXIPIME) 2 g in sodium chloride 0.9 % 100 mL IVPB  Status:  Discontinued        2 g 200 mL/hr over 30 Minutes Intravenous Every 12 hours 11/15/20 1933 11/16/20 1212   11/15/20 1945  vancomycin (VANCOREADY) IVPB 1500 mg/300 mL  Status:  Discontinued        1,500 mg 150 mL/hr over 120 Minutes Intravenous  Once 11/15/20 1933 11/15/20 1939   11/15/20 1945  vancomycin (VANCOCIN) IVPB 1000 mg/200 mL premix       "And" Linked Group Details   1,000 mg 200 mL/hr over 60 Minutes Intravenous  Once 11/15/20 1939 11/15/20 2231   11/15/20 1945  vancomycin (VANCOREADY) IVPB 500 mg/100 mL       "And" Linked Group Details   500 mg 100 mL/hr over 60 Minutes Intravenous  Once 11/15/20 1939 11/16/20 0002       Time spent: 20 minutes    Junious Silk ANP  [facility] 7 am - 330 pm/M-F for direct patient care and secure chat Please refer to Amion for contact info 29  days

## 2020-12-15 NOTE — Progress Notes (Signed)
PT Cancellation Note  Patient Details Name: Anne Bautista MRN: [id number] DOB: Nov 11, 1985   Cancelled Treatment:    Reason Eval/Treat Not Completed: Patient declined, no reason specified. Pt stating too tired and sleeping right now. Requested PT return after lunch. Educated pt PT would try as time allows to return today, otherwise it will be on another date. Pt verbalized understanding. Acute PT to continue during pt's hospital stay.   Sallyanne Kuster, PTA, CLT Acute Rehab Services [phone] 12/15/20, 11:47 AM   Sallyanne Kuster 12/15/2020, 11:46 AM

## 2020-12-16 NOTE — Progress Notes (Addendum)
Subjective:  No new complaints  Antibiotics:  Anti-infectives (From admission, onward)   Start     Dose/Rate Route Frequency Ordered Stop   12/12/20 2000  DAPTOmycin (CUBICIN) 750 mg in sodium chloride 0.9 % IVPB        750 mg 230 mL/hr over 30 Minutes Intravenous Daily 12/12/20 0719 01/19/21 2359   12/09/20 1200  anidulafungin (ERAXIS) 100 mg in sodium chloride 0.9 % 100 mL IVPB  Status:  Discontinued        100 mg 78 mL/hr over 100 Minutes Intravenous Every 24 hours 12/08/20 1109 12/09/20 1013   12/08/20 1200  anidulafungin (ERAXIS) 200 mg in sodium chloride 0.9 % 200 mL IVPB        200 mg 78 mL/hr over 200 Minutes Intravenous  Once 12/08/20 1109 12/08/20 1724   12/06/20 0833  vancomycin (VANCOCIN) powder  Status:  Discontinued          As needed 12/06/20 0834 12/06/20 0852   12/06/20 0600  ceFAZolin (ANCEF) IVPB 2g/100 mL premix        2 g 200 mL/hr over 30 Minutes Intravenous On call to O.R. 12/06/20 0306 12/06/20 0755   11/28/20 2000  DAPTOmycin (CUBICIN) 764 mg in sodium chloride 0.9 % IVPB  Status:  Discontinued        10 mg/kg  76.4 kg 230.6 mL/hr over 30 Minutes Intravenous Daily 11/28/20 0727 12/12/20 0719   11/24/20 2000  DAPTOmycin (CUBICIN) 827 mg in sodium chloride 0.9 % IVPB  Status:  Discontinued        10 mg/kg  82.7 kg 233.1 mL/hr over 30 Minutes Intravenous Daily 11/24/20 0821 11/28/20 0727   11/19/20 1000  DAPTOmycin (CUBICIN) 770 mg in sodium chloride 0.9 % IVPB  Status:  Discontinued        10 mg/kg  77 kg 230.8 mL/hr over 30 Minutes Intravenous Daily 11/19/20 0908 11/24/20 0821   11/19/20 1000  doxycycline (VIBRA-TABS) tablet 100 mg        100 mg Oral Every 12 hours 11/19/20 0908 11/23/20 2123   11/19/20 0439  vancomycin variable dose per unstable renal function (pharmacist dosing)  Status:  Discontinued         Does not apply See admin instructions 11/19/20 0439 11/19/20 0853   11/16/20 2000  vancomycin (VANCOREADY) IVPB 1250 mg/250 mL   Status:  Discontinued        1,250 mg 166.7 mL/hr over 90 Minutes Intravenous Every 24 hours 11/15/20 1933 11/16/20 1108   11/16/20 1200  vancomycin (VANCOCIN) IVPB 1000 mg/200 mL premix  Status:  Discontinued        1,000 mg 200 mL/hr over 60 Minutes Intravenous Every 12 hours 11/16/20 1108 11/19/20 0439   11/16/20 0745  metroNIDAZOLE (FLAGYL) IVPB 500 mg        500 mg 100 mL/hr over 60 Minutes Intravenous  Once 11/16/20 0741 11/16/20 0907   11/15/20 2237  vancomycin (VANCOCIN) 500 MG powder       Note to Pharmacy: Ihor Dow   : cabinet override      11/15/20 2237 11/16/20 1044   11/15/20 2000  ceFEPIme (MAXIPIME) 2 g in sodium chloride 0.9 % 100 mL IVPB  Status:  Discontinued        2 g 200 mL/hr over 30 Minutes Intravenous Every 12 hours 11/15/20 1933 11/16/20 1212   11/15/20 1945  vancomycin (VANCOREADY) IVPB 1500 mg/300 mL  Status:  Discontinued  1,500 mg 150 mL/hr over 120 Minutes Intravenous  Once 11/15/20 1933 11/15/20 1939   11/15/20 1945  vancomycin (VANCOCIN) IVPB 1000 mg/200 mL premix       "And" Linked Group Details   1,000 mg 200 mL/hr over 60 Minutes Intravenous  Once 11/15/20 1939 11/15/20 2231   11/15/20 1945  vancomycin (VANCOREADY) IVPB 500 mg/100 mL       "And" Linked Group Details   500 mg 100 mL/hr over 60 Minutes Intravenous  Once 11/15/20 1939 11/16/20 0002      Medications: Scheduled Meds: . amLODipine  10 mg Oral Daily  . chlorhexidine  15 mL Mouth Rinse BID  . Chlorhexidine Gluconate Cloth  6 each Topical Daily  . cloNIDine  0.1 mg Oral BID  . dicyclomine  20 mg Oral TID AC & HS  . docusate sodium  100 mg Oral BID  . enoxaparin (LOVENOX) injection  40 mg Subcutaneous Q24H  . feeding supplement  237 mL Oral QID  . folic acid  1 mg Oral Daily  . mouth rinse  15 mL Mouth Rinse q12n4p  . methadone  10 mg Oral Q8H  . multivitamin with minerals  1 tablet Oral Daily  . pantoprazole  40 mg Oral Daily  . polyethylene glycol  17 g Oral BID  .  QUEtiapine  25 mg Oral BID  . senna  2 tablet Oral Daily  . sodium chloride flush  10-40 mL Intracatheter Q12H  . thiamine  100 mg Oral Daily   Continuous Infusions: . sodium chloride Stopped (12/05/20 0656)  . DAPTOmycin (CUBICIN)  IV 750 mg (12/15/20 2007)  . lactated ringers 10 mL/hr at 12/06/20 0730   PRN Meds:.sodium chloride, acetaminophen **OR** acetaminophen, aspirin-acetaminophen-caffeine, bisacodyl, guaiFENesin, HYDROmorphone (DILAUDID) injection, levalbuterol, LORazepam, metoprolol tartrate, nicotine, oxyCODONE, sodium chloride flush    Objective: Weight change:  No intake or output data in the 24 hours ending 12/16/20 1515 Blood pressure 128/75, pulse 99, temperature 99 F (37.2 C), temperature source Oral, resp. rate 16, height 5\' 7"  (1.702 m), weight 75.2 kg, last menstrual period 11/10/2020, SpO2 97 %. Temp:  [98.8 F (37.1 C)-99.7 F (37.6 C)] 99 F (37.2 C) (03/15 1228) Pulse Rate:  [98-106] 99 (03/15 1228) Resp:  [16-18] 16 (03/15 1228) BP: (109-128)/(63-78) 128/75 (03/15 1228) SpO2:  [96 %-100 %] 97 % (03/15 1228)  Physical Exam: Physical Exam Constitutional:      General: She is not in acute distress.    Appearance: She is well-developed. She is not diaphoretic.  HENT:     Head: Normocephalic and atraumatic.     Right Ear: External ear normal.     Left Ear: External ear normal.     Mouth/Throat:     Pharynx: No oropharyngeal exudate.  Eyes:     General: No scleral icterus.    Conjunctiva/sclera: Conjunctivae normal.     Pupils: Pupils are equal, round, and reactive to light.  Cardiovascular:     Rate and Rhythm: Regular rhythm. Tachycardia present.     Heart sounds: No murmur heard.   Pulmonary:     Effort: Prolonged expiration present. No respiratory distress.     Breath sounds: No wheezing.  Abdominal:     General: Bowel sounds are normal. There is no distension.     Palpations: Abdomen is soft.     Tenderness: There is no abdominal  tenderness. There is no rebound.  Musculoskeletal:        General: No tenderness. Normal range of motion.  Lymphadenopathy:     Cervical: No cervical adenopathy.  Skin:    General: Skin is warm and dry.     Coloration: Skin is not pale.     Findings: No erythema or rash.  Neurological:     General: No focal deficit present.     Mental Status: She is alert and oriented to person, place, and time.     Motor: No abnormal muscle tone.     Coordination: Coordination normal.  Psychiatric:        Behavior: Behavior normal.        Thought Content: Thought content normal.        Judgment: Judgment normal.   Shoulder bandaged.    CBC:    BMET Recent Labs    12/14/20 0420  NA 137  K 3.2*  CL 105  CO2 25  GLUCOSE 102*  BUN 9  CREATININE 1.21*  CALCIUM 8.2*     Liver Panel  No results for input(s): PROT, ALBUMIN, AST, ALT, ALKPHOS, BILITOT, BILIDIR, IBILI in the last 72 hours.     Sedimentation Rate No results for input(s): ESRSEDRATE in the last 72 hours. C-Reactive Protein No results for input(s): CRP in the last 72 hours.  Micro Results: Recent Results (from the past 720 hour(s))  Culture, blood (routine x 2)     Status: None   Collection Time: 11/17/20  9:28 AM   Specimen: BLOOD RIGHT FOREARM  Result Value Ref Range Status   Specimen Description BLOOD RIGHT FOREARM  Final   Special Requests   Final    BOTTLES DRAWN AEROBIC AND ANAEROBIC Blood Culture results may not be optimal due to an inadequate volume of blood received in culture bottles   Culture   Final    NO GROWTH 5 DAYS Performed at [facility], [address] [place], [place] [address]    Report Status 11/22/2020 FINAL  Final  Culture, blood (routine x 2)     Status: Abnormal   Collection Time: 11/17/20  9:39 AM   Specimen: BLOOD RIGHT HAND  Result Value Ref Range Status   Specimen Description BLOOD RIGHT HAND  Final   Special Requests   Final    BOTTLES DRAWN AEROBIC AND ANAEROBIC  Blood Culture results may not be optimal due to an inadequate volume of blood received in culture bottles   Culture  Setup Time   Final    GRAM POSITIVE COCCI IN CLUSTERS ANAEROBIC BOTTLE ONLY Organism ID to follow CRITICAL RESULT CALLED TO, READ BACK BY AND VERIFIED WITHThresa Ross PHARMD, AT 6045 11/20/20 Renato Shin Performed at [facility], [address], [place], [place] [address]    Culture METHICILLIN RESISTANT STAPHYLOCOCCUS AUREUS (A)  Final   Report Status 11/22/2020 FINAL  Final   Organism ID, Bacteria METHICILLIN RESISTANT STAPHYLOCOCCUS AUREUS  Final      Susceptibility   Methicillin resistant staphylococcus aureus - MIC*    CIPROFLOXACIN <=0.5 SENSITIVE Sensitive     ERYTHROMYCIN >=8 RESISTANT Resistant     GENTAMICIN <=0.5 SENSITIVE Sensitive     OXACILLIN >=4 RESISTANT Resistant     TETRACYCLINE <=1 SENSITIVE Sensitive     VANCOMYCIN 1 SENSITIVE Sensitive     TRIMETH/SULFA <=10 SENSITIVE Sensitive     CLINDAMYCIN <=0.25 SENSITIVE Sensitive     RIFAMPIN <=0.5 SENSITIVE Sensitive     Inducible Clindamycin NEGATIVE Sensitive     * METHICILLIN RESISTANT STAPHYLOCOCCUS AUREUS  Blood Culture ID Panel (Reflexed)  Status: Abnormal   Collection Time: 11/17/20  9:39 AM  Result Value Ref Range Status   Enterococcus faecalis NOT DETECTED NOT DETECTED Final   Enterococcus Faecium NOT DETECTED NOT DETECTED Final   Listeria monocytogenes NOT DETECTED NOT DETECTED Final   Staphylococcus species DETECTED (A) NOT DETECTED Final    Comment: CRITICAL RESULT CALLED TO, READ BACK BY AND VERIFIED WITHThresa Ross PHARMD, AT 3244 11/20/20 D. VANHOOK    Staphylococcus aureus (BCID) DETECTED (A) NOT DETECTED Final    Comment: Methicillin (oxacillin)-resistant Staphylococcus aureus (MRSA). MRSA is predictably resistant to beta-lactam antibiotics (except ceftaroline). Preferred therapy is vancomycin unless clinically contraindicated. Patient requires contact precautions if   hospitalized. CRITICAL RESULT CALLED TO, READ BACK BY AND VERIFIED WITHThresa Ross PHARMD, AT 0102 11/20/20 D. VANHOOK    Staphylococcus epidermidis NOT DETECTED NOT DETECTED Final   Staphylococcus lugdunensis NOT DETECTED NOT DETECTED Final   Streptococcus species NOT DETECTED NOT DETECTED Final   Streptococcus agalactiae NOT DETECTED NOT DETECTED Final   Streptococcus pneumoniae NOT DETECTED NOT DETECTED Final   Streptococcus pyogenes NOT DETECTED NOT DETECTED Final   A.calcoaceticus-baumannii NOT DETECTED NOT DETECTED Final   Bacteroides fragilis NOT DETECTED NOT DETECTED Final   Enterobacterales NOT DETECTED NOT DETECTED Final   Enterobacter cloacae complex NOT DETECTED NOT DETECTED Final   Escherichia coli NOT DETECTED NOT DETECTED Final   Klebsiella aerogenes NOT DETECTED NOT DETECTED Final   Klebsiella oxytoca NOT DETECTED NOT DETECTED Final   Klebsiella pneumoniae NOT DETECTED NOT DETECTED Final   Proteus species NOT DETECTED NOT DETECTED Final   Salmonella species NOT DETECTED NOT DETECTED Final   Serratia marcescens NOT DETECTED NOT DETECTED Final   Haemophilus influenzae NOT DETECTED NOT DETECTED Final   Neisseria meningitidis NOT DETECTED NOT DETECTED Final   Pseudomonas aeruginosa NOT DETECTED NOT DETECTED Final   Stenotrophomonas maltophilia NOT DETECTED NOT DETECTED Final   Candida albicans NOT DETECTED NOT DETECTED Final   Candida auris NOT DETECTED NOT DETECTED Final   Candida glabrata NOT DETECTED NOT DETECTED Final   Candida krusei NOT DETECTED NOT DETECTED Final   Candida parapsilosis NOT DETECTED NOT DETECTED Final   Candida tropicalis NOT DETECTED NOT DETECTED Final   Cryptococcus neoformans/gattii NOT DETECTED NOT DETECTED Final   Meth resistant mecA/C and MREJ DETECTED (A) NOT DETECTED Final    Comment: CRITICAL RESULT CALLED TO, READ BACK BY AND VERIFIED WITHThresa Ross PHARMD, AT 7253 11/20/20 Renato Shin Performed at [facility], [address], [place], [place] [address]   MRSA PCR Screening     Status: Abnormal   Collection Time: 11/17/20 11:38 AM   Specimen: Nasopharyngeal  Result Value Ref Range Status   MRSA by PCR POSITIVE (A) NEGATIVE Final    Comment:        The GeneXpert MRSA Assay (FDA approved for NASAL specimens only), is one component of a comprehensive MRSA colonization surveillance program. It is not intended to diagnose MRSA infection nor to guide or monitor treatment for MRSA infections. RESULT CALLED TO, READ BACK BY AND VERIFIED WITH: R ROBERTS RN 1400 11/17/20 A BROWNING Performed at [facility], [address], [place], [place] [address]   Body fluid culture w Gram Stain     Status: None   Collection Time: 11/17/20  4:05 PM   Specimen: Synovium; Body Fluid  Result Value Ref Range Status   Specimen Description SYNOVIAL RIGHT SHOULDER  Final   Special Requests  NONE  Final   Gram Stain   Final    ABUNDANT WBC PRESENT, PREDOMINANTLY PMN NO ORGANISMS SEEN    Culture   Final    RARE METHICILLIN RESISTANT STAPHYLOCOCCUS AUREUS CRITICAL RESULT CALLED TO, READ BACK BY AND VERIFIED WITH: RN T.SHELTON AT 1211 ON 11/19/2020 BY T.SAAD Performed at [facility], [address], [place], [place] [address]    Report Status 11/21/2020 FINAL  Final   Organism ID, Bacteria METHICILLIN RESISTANT STAPHYLOCOCCUS AUREUS  Final      Susceptibility   Methicillin resistant staphylococcus aureus - MIC*    CIPROFLOXACIN <=0.5 SENSITIVE Sensitive     ERYTHROMYCIN >=8 RESISTANT Resistant     GENTAMICIN <=0.5 SENSITIVE Sensitive     OXACILLIN >=4 RESISTANT Resistant     TETRACYCLINE <=1 SENSITIVE Sensitive     VANCOMYCIN <=0.5 SENSITIVE Sensitive     TRIMETH/SULFA <=10 SENSITIVE Sensitive     CLINDAMYCIN <=0.25 SENSITIVE Sensitive     RIFAMPIN <=0.5 SENSITIVE Sensitive     Inducible Clindamycin NEGATIVE Sensitive     * RARE METHICILLIN RESISTANT STAPHYLOCOCCUS AUREUS  Culture, blood (Routine X 2) w  Reflex to ID Panel     Status: None   Collection Time: 11/21/20  5:26 AM   Specimen: BLOOD RIGHT HAND  Result Value Ref Range Status   Specimen Description BLOOD RIGHT HAND  Final   Special Requests   Final    BOTTLES DRAWN AEROBIC ONLY Blood Culture adequate volume   Culture   Final    NO GROWTH 5 DAYS Performed at [facility], [address] [place], [place] [address]    Report Status 11/26/2020 FINAL  Final  Culture, blood (Routine X 2) w Reflex to ID Panel     Status: None   Collection Time: 11/21/20  5:26 AM   Specimen: BLOOD LEFT HAND  Result Value Ref Range Status   Specimen Description BLOOD LEFT HAND  Final   Special Requests   Final    BOTTLES DRAWN AEROBIC ONLY Blood Culture adequate volume   Culture   Final    NO GROWTH 5 DAYS Performed at [facility], [address] [place], [place] [address]    Report Status 11/26/2020 FINAL  Final  Aerobic/Anaerobic Culture w Gram Stain (surgical/deep wound)     Status: None   Collection Time: 12/06/20  8:39 AM   Specimen: PATH Other; Body Fluid  Result Value Ref Range Status   Specimen Description ABSCESS RIGHT SHOULDER ABSCESS SPEC A  Final   Special Requests RIGHT SHOULDER ABSCESS  Final   Gram Stain   Final    FEW WBC PRESENT, PREDOMINANTLY PMN RARE GRAM POSITIVE COCCI CORRECTED RESULTS PREVIOUSLY REPORTED AS: RARE YEAST CORRECTED RESULTS CALLED TO: Jettie Pagan PHARMD @1010  12/09/20 EB    Culture   Final    No growth aerobically or anaerobically. Performed at [facility], [address] [place], [place]    Report Status 12/11/2020 FINAL  Final  Culture, blood (routine x 2)     Status: None   Collection Time: 12/10/20 10:00 PM   Specimen: BLOOD RIGHT HAND  Result Value Ref Range Status   Specimen Description BLOOD RIGHT HAND  Final   Special Requests   Final    BOTTLES DRAWN AEROBIC AND ANAEROBIC Blood Culture adequate volume   Culture   Final    NO GROWTH 5 DAYS Performed at  [facility], [address] [place], [place] [address]    Report Status 12/15/2020 FINAL  Final  Culture, blood (routine x 2)     Status: None   Collection Time: 12/10/20 10:15 PM   Specimen: BLOOD LEFT HAND  Result Value Ref Range Status   Specimen Description BLOOD LEFT HAND  Final   Special Requests   Final    BOTTLES DRAWN AEROBIC AND ANAEROBIC Blood Culture adequate volume   Culture   Final    NO GROWTH 5 DAYS Performed at [facility], [address], [place], [place] [address]    Report Status 12/15/2020 FINAL  Final  Culture, Urine     Status: None   Collection Time: 12/11/20 10:38 AM   Specimen: Urine, Clean Catch  Result Value Ref Range Status   Specimen Description URINE, CLEAN CATCH  Final   Special Requests DAPTOMYCIN  Final   Culture   Final    NO GROWTH Performed at [facility], [address] [place], [place] [address]    Report Status 12/12/2020 FINAL  Final    Studies/Results: ECHOCARDIOGRAM LIMITED  Result Date: 12/15/2020    ECHOCARDIOGRAM LIMITED REPORT   Patient Name:   Anne Bautista Date of Exam: 12/15/2020 Medical Rec #:  [id number]   Height:       67.0 in Accession #:    [id number]  Weight:       165.8 lb Date of Birth:  02-Jul-1986    BSA:          1.867 m Patient Age:    35 years    BP:           132 /71 mmHg Patient Gender: F           HR:           93 bpm. Exam Location:  Inpatient Procedure: Limited Echo, Limited Color Doppler and Color Doppler Indications:    Endocarditis  History:        Patient has prior history of Echocardiogram examinations, most                 recent 11/17/2020. Signs/Symptoms:vegetation.  Sonographer:    Anne Bautista RDCS Referring Phys: [id number] ELLIS  Sonographer Comments: Technically challenging study due to limited acoustic windows. IMPRESSIONS  1. Left ventricular ejection fraction, by estimation, is 60 to 65%. The left ventricle has normal function.  2. There is a large mobile density on the Tricuspid  valve -c/w endocarditis . Suggest TEE if clinically indicated. The study is not significantly changed from the previous study .     Marland Kitchen Tricuspid valve regurgitation is moderate to severe. FINDINGS  Left Ventricle: Left ventricular ejection fraction, by estimation, is 60 to 65%. The left ventricle has normal function. Pericardium: There is no evidence of pericardial effusion. Tricuspid Valve: There is a large mobile density on the Tricuspid valve -c/w endocarditis . Suggest TEE if clinically indicated. The study is not significantly changed from the previous study. Tricuspid valve regurgitation is moderate to severe.  LV Volumes (MOD) LV vol d, MOD A2C: 91.3 ml LV vol d, MOD A4C: 85.1 ml LV vol s, MOD A2C: 50.4 ml LV vol s, MOD A4C: 29.5 ml LV SV MOD A2C:     40.9 ml LV SV MOD A4C:     85.1 ml LV SV MOD BP:      50.4 ml LEFT ATRIUM           Index  RIGHT ATRIUM           Index LA Vol (A2C): 44.4 ml 23.78 ml/m RA Area:     17.00 cm                                   RA Volume:   47.70 ml  25.55 ml/m  TRICUSPID VALVE TR Peak grad:   14.4 mmHg TR Vmax:        190.00 cm/s Anne Miss MD Electronically signed by Anne Miss MD Signature Date/Time: 12/15/2020/5:13:49 PM    Final       Assessment/Plan:  INTERVAL HISTORY:  Repeat echocardiogram shows a large mobile density in the tricuspid valve     Principal Problem:   Endocarditis of tricuspid valve Active Problems:   Severe sepsis due to methicillin resistant Staphylococcus aureus (MRSA) with acute organ dysfunction (HCC)   Poor dentition   Malnutrition (HCC)   Thrombocytopenia (HCC)   Tobacco dependence   Acute cystitis without hematuria   Malnutrition of moderate degree   Abdominal pain   Left foot pain   Septic embolism (HCC)   Staphylococcal arthritis of right shoulder (HCC)   MRSA bacteremia   Acute on chronic respiratory failure with hypoxia (HCC)   Sepsis (HCC)    Anne Bautista is a 35 y.o. female with IV drug use MRSA  bacteremia tricuspid had about tricuspid valve endocarditis with septic emboli to the lungs right septic shoulder status post orthopedic surgery which disclosed all to pull subdeltoid abscesses within the right side.  She has had I&D.  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  Await Dr. Lucilla Lame decision ? Or not to proceed with angiovac  #2 septic shoulder with MRSA   Continue to mycin for now.  I suspect as mentioned before we will likely have to go to unconventional therapy if she decides that she wants to leave the hospital for completing 6 weeks of postoperative antibiotics.  I spent greater than 35 minutes with the patient including greater than 50% of time in face to face counsel of the patient and in coordination of her care.     LOS: 30 days   Acey Lav 12/16/2020, 3:15 PM

## 2020-12-16 NOTE — Progress Notes (Signed)
Occupational Therapy Treatment Patient Details Name: Anne Bautista MRN: [id number] DOB: 01/06/1986 Today's Date: 12/16/2020    History of present illness 35 yo admitted 2/14 with sepsis due to MRSA bacteremia in setting of IVDU. Rt shoulder pain s/p aspiration 2/14. Underwent I&D R shoulder 3/5. Pt had multiple subdeltoid abscesses. PMHx: IVDU, malnutrition, poor dentition.   OT comments  Pt progressing towards established OT goals and demonstrating performance of BADLs at Mod I - Min A level. Pt continues to present with decreased volition to perform BADLs and requiring increased encouragement for participation. Pt performing toileting and hand hygiene at Mod I level with increased time. Pt performing UB and LB bathing with Supervision (Min A for washing back). Discussed new therapy goals and pt reporting she needs to be able to walk farther distances without getting dizzy. Agreeable to HEP for getting stronger but requiring increased cues to problem solve that she needs to practice shorter distances to reach her activity goal. Update dc recommend dc to home once medically stable per physician. Will continue to follow acutely as admitted. Update goals.   Follow Up Recommendations  No OT follow up    Equipment Recommendations  None recommended by OT    Recommendations for Other Services      Precautions / Restrictions Precautions Precautions: Fall Precaution Comments: right shoulder, B ankles       Mobility Bed Mobility Overal bed mobility: Independent                  Transfers Overall transfer level: Needs assistance Equipment used: None Transfers: Sit to/from Stand Sit to Stand: Supervision         General transfer comment: Supervision for safety    Balance Overall balance assessment: Needs assistance Sitting-balance support: Feet supported;No upper extremity supported Sitting balance-Leahy Scale: Normal     Standing balance support: No upper extremity  supported;During functional activity Standing balance-Leahy Scale: Good                             ADL either performed or assessed with clinical judgement   ADL Overall ADL's : Needs assistance/impaired     Grooming: Wash/dry hands;Modified independent;Standing   Upper Body Bathing: Minimal assistance;Sitting   Lower Body Bathing: Supervison/ safety;Sit to/from stand           Toilet Transfer: Modified Independent   Toileting- Clothing Manipulation and Hygiene: Modified independent;Sit to/from stand       Functional mobility during ADLs: Supervision/safety       Vision   Vision Assessment?: Vision impaired- to be further tested in functional context   Perception     Praxis      Cognition Arousal/Alertness: Awake/alert Behavior During Therapy: WFL for tasks assessed/performed Overall Cognitive Status: Impaired/Different from baseline Area of Impairment: Safety/judgement;Awareness;Problem solving                         Safety/Judgement: Decreased awareness of safety;Decreased awareness of deficits Awareness: Emergent Problem Solving: Slow processing;Requires verbal cues General Comments: Pt with decreased volition for performance of ADLs and therapy. Pt reporting she has been "napping" today. Pt requiring increased encouragement for participate in therapy. Demonstrating good following of cues during ADLs and requiring increased time for problem solving. Poor executive functioning to understand how current acitvity level with impact her performance at dc.        Exercises     Shoulder Instructions  General Comments Discussed pt's therapy goals. Pt reporting she needs to be able to walk farther distances without feeling dizzy. Talked with patient about how she will need to pratice this in smaller incriments and work herself up to longer distances.    Pertinent Vitals/ Pain       Pain Assessment: Faces Faces Pain Scale: No  hurt Pain Intervention(s): Monitored during session  Home Living                                          Prior Functioning/Environment              Frequency  Min 1X/week        Progress Toward Goals  OT Goals(current goals can now be found in the care plan section)  Progress towards OT goals: Progressing toward goals  Acute Rehab OT Goals Patient Stated Goal: to go home OT Goal Formulation: With patient Time For Goal Achievement: 12/30/20 Potential to Achieve Goals: Good ADL Goals Pt Will Perform Eating: with set-up;with adaptive utensils;sitting Pt Will Perform Grooming: with supervision;sitting Pt Will Perform Upper Body Dressing: with supervision;sitting Pt Will Perform Lower Body Dressing: sit to/from stand;with min guard assist Pt Will Transfer to Toilet: with min guard assist;ambulating Pt/caregiver will Perform Home Exercise Program: Increased strength;Both right and left upper extremity Additional ADL Goal #1: Pt following 1-2 step commands with minimal multimodal cues.  Plan Frequency needs to be updated;Discharge plan needs to be updated    Co-evaluation                 AM-PAC OT "6 Clicks" Daily Activity     Outcome Measure   Help from another person eating meals?: None Help from another person taking care of personal grooming?: None Help from another person toileting, which includes using toliet, bedpan, or urinal?: None Help from another person bathing (including washing, rinsing, drying)?: A Little Help from another person to put on and taking off regular upper body clothing?: A Little Help from another person to put on and taking off regular lower body clothing?: A Little 6 Click Score: 21    End of Session    OT Visit Diagnosis: Unsteadiness on feet (R26.81);Muscle weakness (generalized) (M62.81);Other symptoms and signs involving cognitive function;Pain Pain - Right/Left: Right Pain - part of body: Shoulder    Activity Tolerance Patient tolerated treatment well   Patient Left with call bell/phone within reach;in bed   Nurse Communication Mobility status        Time: 5176-1607 OT Time Calculation (min): 16 min  Charges: OT General Charges $OT Visit: 1 Visit OT Treatments $Self Care/Home Management : 8-22 mins  Trudy Kory MSOT, OTR/L Acute Rehab Pager: [phone] Office: [phone]   Theodoro Grist Burton Gahan 12/16/2020, 3:36 PM

## 2020-12-16 NOTE — Progress Notes (Signed)
Physical Therapy Treatment Patient Details Name: Anne Bautista MRN: [id number] DOB: 01/17/1986 Today's Date: 12/16/2020    History of Present Illness 35 yo admitted 2/14 with sepsis due to MRSA bacteremia in setting of IVDU. Rt shoulder pain s/p aspiration 2/14. Underwent I&D R shoulder 3/5. Pt had multiple subdeltoid abscesses. PMHx: IVDU, malnutrition, poor dentition.    PT Comments    Pt with improving mobility. Does not need SNF and may progress and likely not need HHPT. Will continue to follow to incr activity tolerance and prepare for eventual DC.    Follow Up Recommendations  Home health PT;Supervision - Intermittent (May progress and not need HHPT)     Equipment Recommendations  None recommended by PT    Recommendations for Other Services       Precautions / Restrictions Precautions Precautions: Fall Precaution Comments: right shoulder, B ankles    Mobility  Bed Mobility Overal bed mobility: Independent                  Transfers Overall transfer level: Modified independent Equipment used: None Transfers: Sit to/from Stand Sit to Stand: Modified independent (Device/Increase time)         General transfer comment: Supervision for safety  Ambulation/Gait Ambulation/Gait assistance: Supervision Gait Distance (Feet): 250 Feet Assistive device: None Gait Pattern/deviations: Step-through pattern;Decreased stride length   Gait velocity interpretation: >2.62 ft/sec, indicative of community ambulatory General Gait Details: No loss of balance. Pt reports dyspnea near end of amb   Stairs             Wheelchair Mobility    Modified Rankin (Stroke Patients Only)       Balance Overall balance assessment: Needs assistance Sitting-balance support: Feet supported;No upper extremity supported Sitting balance-Leahy Scale: Normal     Standing balance support: No upper extremity supported;During functional activity Standing balance-Leahy Scale: Good                               Cognition Arousal/Alertness: Awake/alert Behavior During Therapy: WFL for tasks assessed/performed Overall Cognitive Status: No family/caregiver present to determine baseline cognitive functioning                                   Exercises      General Comments       Pertinent Vitals/Pain Pain Assessment: No/denies pain Faces Pain Scale: No hurt Pain Intervention(s): Monitored during session    Home Living                      Prior Function            PT Goals (current goals can now be found in the care plan section) Acute Rehab PT Goals Patient Stated Goal: to go home PT Goal Formulation: With patient Time For Goal Achievement: 12/30/20 Potential to Achieve Goals: Good Progress towards PT goals: Goals met and updated - see care plan    Frequency    Min 3X/week      PT Plan Discharge plan needs to be updated    Co-evaluation              AM-PAC PT "6 Clicks" Mobility   Outcome Measure  Help needed turning from your back to your side while in a flat bed without using bedrails?: None Help needed moving from lying on your back to sitting  on the side of a flat bed without using bedrails?: None Help needed moving to and from a bed to a chair (including a wheelchair)?: None Help needed standing up from a chair using your arms (e.g., wheelchair or bedside chair)?: None Help needed to walk in hospital room?: A Little Help needed climbing 3-5 steps with a railing? : A Little 6 Click Score: 22    End of Session   Activity Tolerance: Patient tolerated treatment well Patient left: in bed;with call bell/phone within reach;with nursing/sitter in room   PT Visit Diagnosis: Other abnormalities of gait and mobility (R26.89)     Time: 5749-3552 PT Time Calculation (min) (ACUTE ONLY): 11 min  Charges:  $Gait Training: 8-22 mins                     [place] Pager [phone] Office [place] 12/16/2020, 6:21 PM

## 2020-12-16 NOTE — Progress Notes (Signed)
[facility] PROGRESS NOTE  Anne Bautista BLT:[id number] DOB: 1986/06/05 DOA: 11/15/2020 PCP: Patient, No Pcp Per  Status:  Remains inpatient appropriate because:Unsafe d/c plan and IV treatments appropriate due to intensity of illness or inability to take PO   Dispo: The patient is from: Home              Anticipated d/c is to: SNF recommended by PT due to physical debility              Barriers to discharge: Lack of funding-currently Medicaid potential, history of substance abuse.  Of note prior to admission patient was residing at the [facility].  Patient reports she will live with her mother in [place] after discharge.              Patient currently is not medically stable to d/c.  Patient will remain on IV daptomycin until 4/18.  We also need clarification regarding possibility of valvular angio back surgery during hospitalization   Difficult to place patient No   Level of care: Med-Surg  Code Status: Full Family Communication: Patient only DVT prophylaxis: SQ Heparin Vaccination status: Unknown  Foley catheter: No  HPI: 35 year old female with history of IV drug use, tobacco use, depression came to the hospital with myalgias and cough, he was septic on arrival admitted to the ICU.  She was found to have MRSA bacteremia, tricuspid valve endocarditis as well as septic emboli to lungs, kidneys.  Infectious disease and cardiothoracic surgery consulted.  Hospital course complicated by worsening right shoulder pain found to have septic arthritis, with cultures showing yeast   Subjective: Awake and alert.  No further headaches.  Discussed with patient her lack of participation with PT.  She states she normally stays up late at night sleeps during the day and is more awake in the afternoons.  I encouraged her to pass this along to PT in the event PT can be scheduled for later in the day where she is more apt to participate.  I also updated her on the  results of her echo and my conversation with Dr. Cliffton Asters, and the possibility she may undergo angio Vac procedure if she is a candidate.  She reports fatigue and shortness of breath with activity.  Objective: Vitals:   12/15/20 2333 12/16/20 0319  BP: 109/63 113/65  Pulse: 99 98  Resp: 18 18  Temp: 99.7 F (37.6 C) 99.5 F (37.5 C)  SpO2: 97% 100%    Intake/Output Summary (Last 24 hours) at 12/16/2020 0076 Last data filed at 12/15/2020 1500 Gross per 24 hour  Intake 120 ml  Output --  Net 120 ml   Filed Weights   11/30/20 0500 12/01/20 0328 12/02/20 0300  Weight: 77.6 kg 74 kg 75.2 kg    Exam:  Constitutional: Alert, calm, no acute distress Respiratory: Bilateral lung sounds are clear to auscultation anteriorly, no increased work of breathing at rest.  She is stable on room air. Cardiovascular: Pulse regular and nontachycardic.  Grade 3/5 systolic murmur left sternal border fourth intercostal space.  No peripheral edema no JVD. Abdomen: soft and nontender.  Bowel sounds present.  Requesting juice to drink.  LBM 3/9 Musculoskeletal: no clubbing / cyanosis. No joint deformity upper and lower extremities. Good ROM, no contractures. Normal muscle tone.  Neurologic: CN 2-12 grossly intact.  Currently denies migraine headache.  Moves all extremities x4. Psychiatric: Alert and oriented x3.  Pleasant affect   Assessment/Plan: Acute problems: Severe sepsis  due to MRSA bacteremia/ -Complicated by septic emboli to both lungs and kidneys, right shoulder possibly septic left ankle (MRI refused by patient).  She also has a right subdeltoid abscess. Left ankle improving, no further issues, no pain -ID, cardiothoracic surgery, orthopedic surgery following -Status post incision and drainage of right shoulder subdeltoid abscess with multiple cavities on 3/5 by Dr. Susa Simmonds. Drain in place, removal per ortho  -Continue daptomycin until 01/19/2021, monitor CK. Shoulder cultures revealed yeast so  started on anidulafungin but culture result corrected as rare gram-positive cocci so antifungal medication discontinued-final cultures pending -Last blood cultures negative on 2/18, has a PICC line since 2/28.  Tricuspid valve endocarditis -Cardiothoracic surgery evaluated patient early in the hospitalization for possible angio VAC debridement.  -Discussed with cardiac surgeon Dr. Cliffton Asters via secure chat on 3/14.   -Repeat echocardiogram on 3/14 revealed persistent tricuspid valve endocarditis but due to location not amenable to angio vac.  Dr. Cliffton Asters recommends following up in the outpatient setting once patient "clean" to discuss surgical intervention.  She is hemodynamically stable without any evidence of acute heart failure. -Recent chest x-ray unremarkable no evidence of heart failure.  BNP stable in the 450 range  Septic emboli to left ankle and right shoulder -Status post drainage of right subdeltoid abscess -Drain removed by orthopedic team on 3/10 with documentation that sutures to remain in place for an additional week  Migraine headache -Patient reports recent nausea secondary to migraine holocord of the weekend and normally talks Excedrin Migraine which has been ordered as of 3/14.  Fever -Has been having waxing and waning febrile state throughout the admission without any new sources of infection determined.  IV drug use w/ Heroin, cocaine use, marijuana, tobacco  -cessation was encouraged.   -UDS was positive for cocaine.   -Hep C and HIV are negative.  RPR is negative. -Is on methadone and she would like to continue this after discharge.  Did not wish to transition to Suboxone after discharge.  Acute hypoxic respiratory failure, narcotic withdrawal, bilateral pulmonary septic emboli  -hypoxia resolved, remains on room air   Acute blood loss anemia, iron deficiency anemia  -post operatively, received a total of 2U pRBC, one on 3/6 and one on 3/7  Nonoliguric AKI  with ATN  -creatinine peaked at 2.8, fluctuating but overall stable, 1.4 today   Debility/deconditioning PT  NOTE 3/8: Pt with better color and energy level today than yesterday. Resistant to getting up at first but easily encouraged. Pt worked on sitting and standing balance exercises with balloon that therapist brought. Pt then agreeable to ambulation out of room. With use of RW pt able to ambulate 83' with min-guard A. C/o B ankle pain by end of ambulation and discussed ROM and strengthening for B ankles. PT will continue to follow. On 3/11 patient refused PT given severe nausea likely related to migraine and was unable to actively participate in therapy. OT NOTE 3/8: pt reports pain from HA declining OT session, will check back as time allows.  Moderate protein calorie malnutrition Nutrition Problem: Moderate Malnutrition Etiology: social / environmental circumstances (IVDU) Signs/Symptoms: mild fat depletion,moderate fat depletion,mild muscle depletion,moderate muscle depletion Interventions: Ensure Enlive (each supplement provides 350kcal and 20 grams of protein),MVI Estimated body mass index is 25.97 kg/m as calculated from the following:   Height as of this encounter: 5\' 7"  (1.702 m).   Weight as of this encounter: 75.2 kg.   Wounds: Incision (Closed) 12/06/20 Shoulder Right (Active)  Date First Assessed/Time  First Assessed: 12/06/20 0914   Location: Shoulder  Location Orientation: Right    Assessments 12/06/2020  8:56 AM 12/16/2020  7:20 AM  Dressing Type Adhesive bandage Gauze (Comment)  Dressing Clean;Dry;Intact Clean;Dry;Intact  Site / Wound Assessment Dressing in place / Unable to assess Clean;Dry  Drainage Amount None --     No Linked orders to display     Other problems: Hypertension -continue current regimen  Acute cystitis without hematuria  -MRSA in cultures, she is on antibiotics  Data Reviewed: Basic Metabolic Panel: Recent Labs  Lab 12/14/20 0420  NA  137  K 3.2*  CL 105  CO2 25  GLUCOSE 102*  BUN 9  CREATININE 1.21*  CALCIUM 8.2*  MG 1.7   Liver Function Tests: No results for input(s): AST, ALT, ALKPHOS, BILITOT, PROT, ALBUMIN in the last 168 hours. No results for input(s): LIPASE, AMYLASE in the last 168 hours. No results for input(s): AMMONIA in the last 168 hours. CBC: Recent Labs  Lab 12/14/20 0420  WBC 11.1*  NEUTROABS 6.5  HGB 7.3*  HCT 22.6*  MCV 84.3  PLT 331   Cardiac Enzymes: Recent Labs  Lab 12/14/20 0420  CKTOTAL 13*   BNP (last 3 results) Recent Labs    11/27/20 0430 11/28/20 0505 12/10/20 1600  BNP 670.1* 455.5* 458.5*    ProBNP (last 3 results) No results for input(s): PROBNP in the last 8760 hours.  CBG: Recent Labs  Lab 12/09/20 0729 12/09/20 1216 12/09/20 1752 12/09/20 2114 12/10/20 0609  GLUCAP 132* 113* 87 107* 101*    Recent Results (from the past 240 hour(s))  Aerobic/Anaerobic Culture w Gram Stain (surgical/deep wound)     Status: None   Collection Time: 12/06/20  8:39 AM   Specimen: PATH Other; Body Fluid  Result Value Ref Range Status   Specimen Description ABSCESS RIGHT SHOULDER ABSCESS SPEC A  Final   Special Requests RIGHT SHOULDER ABSCESS  Final   Gram Stain   Final    FEW WBC PRESENT, PREDOMINANTLY PMN RARE GRAM POSITIVE COCCI CORRECTED RESULTS PREVIOUSLY REPORTED AS: RARE YEAST CORRECTED RESULTS CALLED TO: TYLER BAUMEISTER PHARMD  12/09/20 EB    Culture   Final    No growth aerobically or anaerobically. Performed at [facility], [address], [place], [place] [address]    Report Status 12/11/2020 FINAL  Final  Culture, blood (routine x 2)     Status: None   Collection Time: 12/10/20 10:00 PM   Specimen: BLOOD RIGHT HAND  Result Value Ref Range Status   Specimen Description BLOOD RIGHT HAND  Final   Special Requests   Final    BOTTLES DRAWN AEROBIC AND ANAEROBIC Blood Culture adequate volume   Culture   Final    NO GROWTH 5 DAYS Performed at  [facility], [address] [place], [place] [address]    Report Status 12/15/2020 FINAL  Final  Culture, blood (routine x 2)     Status: None   Collection Time: 12/10/20 10:15 PM   Specimen: BLOOD LEFT HAND  Result Value Ref Range Status   Specimen Description BLOOD LEFT HAND  Final   Special Requests   Final    BOTTLES DRAWN AEROBIC AND ANAEROBIC Blood Culture adequate volume   Culture   Final    NO GROWTH 5 DAYS Performed at [facility], [address] [place], [place] [address]    Report Status 12/15/2020 FINAL  Final  Culture, Urine  Status: None   Collection Time: 12/11/20 10:38 AM   Specimen: Urine, Clean Catch  Result Value Ref Range Status   Specimen Description URINE, CLEAN CATCH  Final   Special Requests DAPTOMYCIN  Final   Culture   Final    NO GROWTH Performed at [facility], [address] [place], [place] [address]    Report Status 12/12/2020 FINAL  Final     Studies: ECHOCARDIOGRAM LIMITED  Result Date: 12/15/2020    ECHOCARDIOGRAM LIMITED REPORT   Patient Name:   Anne CoulterLAURA Schadt Date of Exam: 12/15/2020 Medical Rec #:  [id number]   Height:       67.0 in Accession #:    [id number]  Weight:       165.8 lb Date of Birth:  02/01/1986    BSA:          1.867 m Patient Age:    35 years    BP:           132 /71 mmHg Patient Gender: F           HR:           93 bpm. Exam Location:  Inpatient Procedure: Limited Echo, Limited Color Doppler and Color Doppler Indications:    Endocarditis  History:        Patient has prior history of Echocardiogram examinations, most                 recent 11/17/2020. Signs/Symptoms:vegetation.  Sonographer:    Neomia DearAMARA CROWN RDCS Referring Phys: [id number] Kelle DartingALLISON L Sherisse Fullilove  Sonographer Comments: Technically challenging study due to limited acoustic windows. IMPRESSIONS  1. Left ventricular ejection fraction, by estimation, is 60 to 65%. The left ventricle has normal function.  2. There is a large mobile density on the Tricuspid valve -c/w  endocarditis . Suggest TEE if clinically indicated. The study is not significantly changed from the previous study .     Marland Kitchen. Tricuspid valve regurgitation is moderate to severe. FINDINGS  Left Ventricle: Left ventricular ejection fraction, by estimation, is 60 to 65%. The left ventricle has normal function. Pericardium: There is no evidence of pericardial effusion. Tricuspid Valve: There is a large mobile density on the Tricuspid valve -c/w endocarditis . Suggest TEE if clinically indicated. The study is not significantly changed from the previous study. Tricuspid valve regurgitation is moderate to severe.  LV Volumes (MOD) LV vol d, MOD A2C: 91.3 ml LV vol d, MOD A4C: 85.1 ml LV vol s, MOD A2C: 50.4 ml LV vol s, MOD A4C: 29.5 ml LV SV MOD A2C:     40.9 ml LV SV MOD A4C:     85.1 ml LV SV MOD BP:      50.4 ml LEFT ATRIUM           Index       RIGHT ATRIUM           Index LA Vol (A2C): 44.4 ml 23.78 ml/m RA Area:     17.00 cm                                   RA Volume:   47.70 ml  25.55 ml/m  TRICUSPID VALVE TR Peak grad:   14.4 mmHg TR Vmax:        190.00 cm/s Kristeen MissPhilip Nahser MD Electronically signed by Kristeen MissPhilip Nahser MD Signature Date/Time: 12/15/2020/5:13:49 PM    Final  Scheduled Meds: . amLODipine  10 mg Oral Daily  . chlorhexidine  15 mL Mouth Rinse BID  . Chlorhexidine Gluconate Cloth  6 each Topical Daily  . cloNIDine  0.1 mg Oral BID  . dicyclomine  20 mg Oral TID AC & HS  . docusate sodium  100 mg Oral BID  . enoxaparin (LOVENOX) injection  40 mg Subcutaneous Q24H  . feeding supplement  237 mL Oral QID  . folic acid  1 mg Oral Daily  . mouth rinse  15 mL Mouth Rinse q12n4p  . methadone  10 mg Oral Q8H  . multivitamin with minerals  1 tablet Oral Daily  . pantoprazole  40 mg Oral Daily  . polyethylene glycol  17 g Oral BID  . QUEtiapine  25 mg Oral BID  . senna  2 tablet Oral Daily  . sodium chloride flush  10-40 mL Intracatheter Q12H  . thiamine  100 mg Oral Daily   Continuous  Infusions: . sodium chloride Stopped (12/05/20 0656)  . DAPTOmycin (CUBICIN)  IV 750 mg (12/15/20 2007)  . lactated ringers 10 mL/hr at 12/06/20 0730    Principal Problem:   Endocarditis of tricuspid valve Active Problems:   Severe sepsis due to methicillin resistant Staphylococcus aureus (MRSA) with acute organ dysfunction (HCC)   Poor dentition   Malnutrition (HCC)   Thrombocytopenia (HCC)   Tobacco dependence   Acute cystitis without hematuria   Malnutrition of moderate degree   Abdominal pain   Left foot pain   Septic embolism (HCC)   Staphylococcal arthritis of right shoulder (HCC)   MRSA bacteremia   Acute on chronic respiratory failure with hypoxia (HCC)   Sepsis (HCC)   Consultants: Infectious disease Cardiothoracic surgery Orthopedic surgery  Procedures:  2D echocardiogram  Anesthesia on 3/3  Irrigation and debridement right shoulder abscess on 3/5  Antibiotics: Anti-infectives (From admission, onward)   Start     Dose/Rate Route Frequency Ordered Stop   12/12/20 2000  DAPTOmycin (CUBICIN) 750 mg in sodium chloride 0.9 % IVPB        750 mg 230 mL/hr over 30 Minutes Intravenous Daily 12/12/20 0719 01/19/21 2359   12/09/20 1200  anidulafungin (ERAXIS) 100 mg in sodium chloride 0.9 % 100 mL IVPB  Status:  Discontinued        100 mg 78 mL/hr over 100 Minutes Intravenous Every 24 hours 12/08/20 1109 12/09/20 1013   12/08/20 1200  anidulafungin (ERAXIS) 200 mg in sodium chloride 0.9 % 200 mL IVPB        200 mg 78 mL/hr over 200 Minutes Intravenous  Once 12/08/20 1109 12/08/20 1724   12/06/20 0833  vancomycin (VANCOCIN) powder  Status:  Discontinued          As needed 12/06/20 0834 12/06/20 0852   12/06/20 0600  ceFAZolin (ANCEF) IVPB 2g/100 mL premix        2 g 200 mL/hr over 30 Minutes Intravenous On call to O.R. 12/06/20 0306 12/06/20 0755   11/28/20 2000  DAPTOmycin (CUBICIN) 764 mg in sodium chloride 0.9 % IVPB  Status:  Discontinued        10 mg/kg   76.4 kg 230.6 mL/hr over 30 Minutes Intravenous Daily 11/28/20 0727 12/12/20 0719   11/24/20 2000  DAPTOmycin (CUBICIN) 827 mg in sodium chloride 0.9 % IVPB  Status:  Discontinued        10 mg/kg  82.7 kg 233.1 mL/hr over 30 Minutes Intravenous Daily 11/24/20 0821 11/28/20 0727   11/19/20  1000  DAPTOmycin (CUBICIN) 770 mg in sodium chloride 0.9 % IVPB  Status:  Discontinued        10 mg/kg  77 kg 230.8 mL/hr over 30 Minutes Intravenous Daily 11/19/20 0908 11/24/20 0821   11/19/20 1000  doxycycline (VIBRA-TABS) tablet 100 mg        100 mg Oral Every 12 hours 11/19/20 0908 11/23/20 2123   11/19/20 0439  vancomycin variable dose per unstable renal function (pharmacist dosing)  Status:  Discontinued         Does not apply See admin instructions 11/19/20 0439 11/19/20 0853   11/16/20 2000  vancomycin (VANCOREADY) IVPB 1250 mg/250 mL  Status:  Discontinued        1,250 mg 166.7 mL/hr over 90 Minutes Intravenous Every 24 hours 11/15/20 1933 11/16/20 1108   11/16/20 1200  vancomycin (VANCOCIN) IVPB 1000 mg/200 mL premix  Status:  Discontinued        1,000 mg 200 mL/hr over 60 Minutes Intravenous Every 12 hours 11/16/20 1108 11/19/20 0439   11/16/20 0745  metroNIDAZOLE (FLAGYL) IVPB 500 mg        500 mg 100 mL/hr over 60 Minutes Intravenous  Once 11/16/20 0741 11/16/20 0907   11/15/20 2237  vancomycin (VANCOCIN) 500 MG powder       Note to Pharmacy: Ihor Dow   : cabinet override      11/15/20 2237 11/16/20 1044   11/15/20 2000  ceFEPIme (MAXIPIME) 2 g in sodium chloride 0.9 % 100 mL IVPB  Status:  Discontinued        2 g 200 mL/hr over 30 Minutes Intravenous Every 12 hours 11/15/20 1933 11/16/20 1212   11/15/20 1945  vancomycin (VANCOREADY) IVPB 1500 mg/300 mL  Status:  Discontinued        1,500 mg 150 mL/hr over 120 Minutes Intravenous  Once 11/15/20 1933 11/15/20 1939   11/15/20 1945  vancomycin (VANCOCIN) IVPB 1000 mg/200 mL premix       "And" Linked Group Details   1,000 mg 200  mL/hr over 60 Minutes Intravenous  Once 11/15/20 1939 11/15/20 2231   11/15/20 1945  vancomycin (VANCOREADY) IVPB 500 mg/100 mL       "And" Linked Group Details   500 mg 100 mL/hr over 60 Minutes Intravenous  Once 11/15/20 1939 11/16/20 0002       Time spent: 20 minutes    Junious Silk ANP  [facility] 7 am - 330 pm/M-F for direct patient care and secure chat Please refer to Amion for contact info 30  days

## 2020-12-17 LAB — CBC WITH DIFFERENTIAL/PLATELET
Abs Immature Granulocytes: 0.06 10*3/uL (ref 0.00–0.07)
Basophils Absolute: 0.1 10*3/uL (ref 0.0–0.1)
Basophils Relative: 1 %
Eosinophils Absolute: 0.1 10*3/uL (ref 0.0–0.5)
Eosinophils Relative: 1 %
HCT: 23 % — ABNORMAL LOW (ref 36.0–46.0)
Hemoglobin: 7.2 g/dL — ABNORMAL LOW (ref 12.0–15.0)
Immature Granulocytes: 1 %
Lymphocytes Relative: 29 %
Lymphs Abs: 3.2 10*3/uL (ref 0.7–4.0)
MCH: 26.5 pg (ref 26.0–34.0)
MCHC: 31.3 g/dL (ref 30.0–36.0)
MCV: 84.6 fL (ref 80.0–100.0)
Monocytes Absolute: 1 10*3/uL (ref 0.1–1.0)
Monocytes Relative: 9 %
Neutro Abs: 6.6 10*3/uL (ref 1.7–7.7)
Neutrophils Relative %: 59 %
Platelets: 311 10*3/uL (ref 150–400)
RBC: 2.72 MIL/uL — ABNORMAL LOW (ref 3.87–5.11)
RDW: 17.2 % — ABNORMAL HIGH (ref 11.5–15.5)
WBC: 11 10*3/uL — ABNORMAL HIGH (ref 4.0–10.5)
nRBC: 0 % (ref 0.0–0.2)

## 2020-12-17 LAB — BASIC METABOLIC PANEL
Anion gap: 7 (ref 5–15)
BUN: 11 mg/dL (ref 6–20)
CO2: 25 mmol/L (ref 22–32)
Calcium: 8.4 mg/dL — ABNORMAL LOW (ref 8.9–10.3)
Chloride: 103 mmol/L (ref 98–111)
Creatinine, Ser: 1.23 mg/dL — ABNORMAL HIGH (ref 0.44–1.00)
GFR, Estimated: 58 mL/min — ABNORMAL LOW (ref >60)
Glucose, Bld: 101 mg/dL — ABNORMAL HIGH (ref 70–99)
Potassium: 3.7 mmol/L (ref 3.5–5.1)
Sodium: 135 mmol/L (ref 135–145)

## 2020-12-17 LAB — VITAMIN B12: Vitamin B-12: 647 pg/mL (ref 180–914)

## 2020-12-17 LAB — MAGNESIUM: Magnesium: 1.9 mg/dL (ref 1.7–2.4)

## 2020-12-17 NOTE — TOC Progression Note (Signed)
Transition of Care [facility]) - Progression Note    Patient Details  Name: Anne Bautista MRN: [id number] Date of Birth: [birth year]  [facility] CM/SW Contact  Curlene Labrum, RN Phone Number: 12/17/2020, 11:18 AM  Clinical Narrative:    Case management met with the patient at the bedside regarding transitions of care needs for the patient.  The patient plans to discharge home with her mother on 01/19/2021 after completion of IV antibiotics.  The patient is not a candidate for discharge with PICC lines and IV antibiotics since the patient is history of IV drug abuse.  CM and MSW will continue to follow the patient for discharge planning and transition of care needs.   Expected Discharge Plan: Home/Self Care Barriers to Discharge: Continued Medical Work up (Patient to discharge home with mother on/after 01/19/2021)  Expected Discharge Plan and Services Expected Discharge Plan: Home/Self Care In-house Referral: Clinical Social Work Discharge Planning Services: CM [place] appt scheduled,Medication Assistance   Living arrangements for the past 2 months: Apartment                                       Social Determinants of Health (SDOH) Interventions    Readmission Risk Interventions Readmission Risk Prevention Plan 12/10/2020  Transportation Screening Complete  PCP or Specialist Appt within 3-5 Days Complete  HRI or [place] Complete  Social Work Consult for [place] Planning/Counseling Complete  Palliative Care Screening Complete  Medication Review Press photographer) Complete

## 2020-12-17 NOTE — Progress Notes (Signed)
     [address]       [place] [address]             [phone]       Echo reviewed. Vegetation remains on the ventricular side, thus not ideal for Angiovac debridement.  Severe TR. Will assess for valve replacement once she completes rehab program  Pamela Intrieri Keane Scrape

## 2020-12-17 NOTE — Progress Notes (Addendum)
Subjective:  No new complaints  Antibiotics:  Anti-infectives (From admission, onward)   Start     Dose/Rate Route Frequency Ordered Stop   12/12/20 2000  DAPTOmycin (CUBICIN) 750 mg in sodium chloride 0.9 % IVPB        750 mg 230 mL/hr over 30 Minutes Intravenous Daily 12/12/20 0719 01/19/21 2359   12/09/20 1200  anidulafungin (ERAXIS) 100 mg in sodium chloride 0.9 % 100 mL IVPB  Status:  Discontinued        100 mg 78 mL/hr over 100 Minutes Intravenous Every 24 hours 12/08/20 1109 12/09/20 1013   12/08/20 1200  anidulafungin (ERAXIS) 200 mg in sodium chloride 0.9 % 200 mL IVPB        200 mg 78 mL/hr over 200 Minutes Intravenous  Once 12/08/20 1109 12/08/20 1724   12/06/20 0833  vancomycin (VANCOCIN) powder  Status:  Discontinued          As needed 12/06/20 0834 12/06/20 0852   12/06/20 0600  ceFAZolin (ANCEF) IVPB 2g/100 mL premix        2 g 200 mL/hr over 30 Minutes Intravenous On call to O.R. 12/06/20 0306 12/06/20 0755   11/28/20 2000  DAPTOmycin (CUBICIN) 764 mg in sodium chloride 0.9 % IVPB  Status:  Discontinued        10 mg/kg  76.4 kg 230.6 mL/hr over 30 Minutes Intravenous Daily 11/28/20 0727 12/12/20 0719   11/24/20 2000  DAPTOmycin (CUBICIN) 827 mg in sodium chloride 0.9 % IVPB  Status:  Discontinued        10 mg/kg  82.7 kg 233.1 mL/hr over 30 Minutes Intravenous Daily 11/24/20 0821 11/28/20 0727   11/19/20 1000  DAPTOmycin (CUBICIN) 770 mg in sodium chloride 0.9 % IVPB  Status:  Discontinued        10 mg/kg  77 kg 230.8 mL/hr over 30 Minutes Intravenous Daily 11/19/20 0908 11/24/20 0821   11/19/20 1000  doxycycline (VIBRA-TABS) tablet 100 mg        100 mg Oral Every 12 hours 11/19/20 0908 11/23/20 2123   11/19/20 0439  vancomycin variable dose per unstable renal function (pharmacist dosing)  Status:  Discontinued         Does not apply See admin instructions 11/19/20 0439 11/19/20 0853   11/16/20 2000  vancomycin (VANCOREADY) IVPB 1250 mg/250 mL   Status:  Discontinued        1,250 mg 166.7 mL/hr over 90 Minutes Intravenous Every 24 hours 11/15/20 1933 11/16/20 1108   11/16/20 1200  vancomycin (VANCOCIN) IVPB 1000 mg/200 mL premix  Status:  Discontinued        1,000 mg 200 mL/hr over 60 Minutes Intravenous Every 12 hours 11/16/20 1108 11/19/20 0439   11/16/20 0745  metroNIDAZOLE (FLAGYL) IVPB 500 mg        500 mg 100 mL/hr over 60 Minutes Intravenous  Once 11/16/20 0741 11/16/20 0907   11/15/20 2237  vancomycin (VANCOCIN) 500 MG powder       Note to Pharmacy: Ihor Dow   : cabinet override      11/15/20 2237 11/16/20 1044   11/15/20 2000  ceFEPIme (MAXIPIME) 2 g in sodium chloride 0.9 % 100 mL IVPB  Status:  Discontinued        2 g 200 mL/hr over 30 Minutes Intravenous Every 12 hours 11/15/20 1933 11/16/20 1212   11/15/20 1945  vancomycin (VANCOREADY) IVPB 1500 mg/300 mL  Status:  Discontinued  1,500 mg 150 mL/hr over 120 Minutes Intravenous  Once 11/15/20 1933 11/15/20 1939   11/15/20 1945  vancomycin (VANCOCIN) IVPB 1000 mg/200 mL premix       "And" Linked Group Details   1,000 mg 200 mL/hr over 60 Minutes Intravenous  Once 11/15/20 1939 11/15/20 2231   11/15/20 1945  vancomycin (VANCOREADY) IVPB 500 mg/100 mL       "And" Linked Group Details   500 mg 100 mL/hr over 60 Minutes Intravenous  Once 11/15/20 1939 11/16/20 0002      Medications: Scheduled Meds: . amLODipine  10 mg Oral Daily  . chlorhexidine  15 mL Mouth Rinse BID  . Chlorhexidine Gluconate Cloth  6 each Topical Daily  . cloNIDine  0.1 mg Oral BID  . dicyclomine  20 mg Oral TID AC & HS  . docusate sodium  100 mg Oral BID  . enoxaparin (LOVENOX) injection  40 mg Subcutaneous Q24H  . feeding supplement  237 mL Oral QID  . folic acid  1 mg Oral Daily  . mouth rinse  15 mL Mouth Rinse q12n4p  . methadone  10 mg Oral Q8H  . multivitamin with minerals  1 tablet Oral Daily  . pantoprazole  40 mg Oral Daily  . polyethylene glycol  17 g Oral BID  .  QUEtiapine  25 mg Oral BID  . senna  2 tablet Oral Daily  . sodium chloride flush  10-40 mL Intracatheter Q12H  . thiamine  100 mg Oral Daily   Continuous Infusions: . sodium chloride Stopped (12/05/20 0656)  . DAPTOmycin (CUBICIN)  IV Stopped (12/16/20 2215)  . lactated ringers 10 mL/hr at 12/06/20 0730   PRN Meds:.sodium chloride, acetaminophen **OR** acetaminophen, aspirin-acetaminophen-caffeine, bisacodyl, guaiFENesin, HYDROmorphone (DILAUDID) injection, levalbuterol, LORazepam, metoprolol tartrate, nicotine, oxyCODONE, sodium chloride flush    Objective: Weight change:   Intake/Output Summary (Last 24 hours) at 12/17/2020 1113 Last data filed at 12/16/2020 2215 Gross per 24 hour  Intake 365 ml  Output --  Net 365 ml   Blood pressure 119/76, pulse 100, temperature 99.2 F (37.3 C), temperature source Oral, resp. rate 20, height 5\' 7"  (1.702 m), weight 66.6 kg, last menstrual period 11/10/2020, SpO2 95 %. Temp:  [98.5 F (36.9 C)-100 F (37.8 C)] 99.2 F (37.3 C) (03/16 0841) Pulse Rate:  [97-110] 100 (03/16 0841) Resp:  [16-20] 20 (03/16 0841) BP: (116-134)/(69-91) 119/76 (03/16 0841) SpO2:  [94 %-98 %] 95 % (03/16 0841) Weight:  [66.6 kg] 66.6 kg (03/15 2100)  Physical Exam: Physical Exam Constitutional:      General: She is not in acute distress.    Appearance: She is well-developed. She is not diaphoretic.  HENT:     Head: Normocephalic and atraumatic.     Right Ear: External ear normal.     Left Ear: External ear normal.     Mouth/Throat:     Pharynx: No oropharyngeal exudate.  Eyes:     General: No scleral icterus.    Conjunctiva/sclera: Conjunctivae normal.     Pupils: Pupils are equal, round, and reactive to light.  Cardiovascular:     Rate and Rhythm: Regular rhythm. Tachycardia present.     Heart sounds: No murmur heard.   Pulmonary:     Effort: Prolonged expiration present. No respiratory distress.     Breath sounds: No wheezing.  Abdominal:      General: Bowel sounds are normal. There is no distension.     Palpations: Abdomen is soft.  Tenderness: There is no abdominal tenderness. There is no rebound.  Musculoskeletal:        General: No tenderness. Normal range of motion.  Lymphadenopathy:     Cervical: No cervical adenopathy.  Skin:    General: Skin is warm and dry.     Coloration: Skin is not pale.     Findings: No erythema or rash.  Neurological:     General: No focal deficit present.     Mental Status: She is alert and oriented to person, place, and time.     Motor: No abnormal muscle tone.     Coordination: Coordination normal.  Psychiatric:        Behavior: Behavior normal.        Thought Content: Thought content normal.        Judgment: Judgment normal.   Shoulder bandaged.    CBC:    BMET No results for input(s): NA, K, CL, CO2, GLUCOSE, BUN, CREATININE, CALCIUM in the last 72 hours.   Liver Panel  No results for input(s): PROT, ALBUMIN, AST, ALT, ALKPHOS, BILITOT, BILIDIR, IBILI in the last 72 hours.     Sedimentation Rate No results for input(s): ESRSEDRATE in the last 72 hours. C-Reactive Protein No results for input(s): CRP in the last 72 hours.  Micro Results: Recent Results (from the past 720 hour(s))  MRSA PCR Screening     Status: Abnormal   Collection Time: 11/17/20 11:38 AM   Specimen: Nasopharyngeal  Result Value Ref Range Status   MRSA by PCR POSITIVE (A) NEGATIVE Final    Comment:        The GeneXpert MRSA Assay (FDA approved for NASAL specimens only), is one component of a comprehensive MRSA colonization surveillance program. It is not intended to diagnose MRSA infection nor to guide or monitor treatment for MRSA infections. RESULT CALLED TO, READ BACK BY AND VERIFIED WITH: R ROBERTS RN 1400 11/17/20 A BROWNING Performed at [facility], [address] [place], [place] [address]   Body fluid culture w Gram Stain     Status: None   Collection Time: 11/17/20   4:05 PM   Specimen: Synovium; Body Fluid  Result Value Ref Range Status   Specimen Description SYNOVIAL RIGHT SHOULDER  Final   Special Requests NONE  Final   Gram Stain   Final    ABUNDANT WBC PRESENT, PREDOMINANTLY PMN NO ORGANISMS SEEN    Culture   Final    RARE METHICILLIN RESISTANT STAPHYLOCOCCUS AUREUS CRITICAL RESULT CALLED TO, READ BACK BY AND VERIFIED WITH: RN T.SHELTON AT 1211 ON 11/19/2020 BY T.SAAD Performed at [facility], [address], [place], [place] [address]    Report Status 11/21/2020 FINAL  Final   Organism ID, Bacteria METHICILLIN RESISTANT STAPHYLOCOCCUS AUREUS  Final      Susceptibility   Methicillin resistant staphylococcus aureus - MIC*    CIPROFLOXACIN <=0.5 SENSITIVE Sensitive     ERYTHROMYCIN >=8 RESISTANT Resistant     GENTAMICIN <=0.5 SENSITIVE Sensitive     OXACILLIN >=4 RESISTANT Resistant     TETRACYCLINE <=1 SENSITIVE Sensitive     VANCOMYCIN <=0.5 SENSITIVE Sensitive     TRIMETH/SULFA <=10 SENSITIVE Sensitive     CLINDAMYCIN <=0.25 SENSITIVE Sensitive     RIFAMPIN <=0.5 SENSITIVE Sensitive     Inducible Clindamycin NEGATIVE Sensitive     * RARE METHICILLIN RESISTANT STAPHYLOCOCCUS AUREUS  Culture, blood (Routine X 2) w Reflex to ID Panel     Status: None  Collection Time: 11/21/20  5:26 AM   Specimen: BLOOD RIGHT HAND  Result Value Ref Range Status   Specimen Description BLOOD RIGHT HAND  Final   Special Requests   Final    BOTTLES DRAWN AEROBIC ONLY Blood Culture adequate volume   Culture   Final    NO GROWTH 5 DAYS Performed at [facility], [address] [place], [place] [address]    Report Status 11/26/2020 FINAL  Final  Culture, blood (Routine X 2) w Reflex to ID Panel     Status: None   Collection Time: 11/21/20  5:26 AM   Specimen: BLOOD LEFT HAND  Result Value Ref Range Status   Specimen Description BLOOD LEFT HAND  Final   Special Requests   Final    BOTTLES DRAWN AEROBIC ONLY Blood Culture adequate volume    Culture   Final    NO GROWTH 5 DAYS Performed at [facility], [address], [place], [place] [address]    Report Status 11/26/2020 FINAL  Final  Aerobic/Anaerobic Culture w Gram Stain (surgical/deep wound)     Status: None   Collection Time: 12/06/20  8:39 AM   Specimen: PATH Other; Body Fluid  Result Value Ref Range Status   Specimen Description ABSCESS RIGHT SHOULDER ABSCESS SPEC A  Final   Special Requests RIGHT SHOULDER ABSCESS  Final   Gram Stain   Final    FEW WBC PRESENT, PREDOMINANTLY PMN RARE GRAM POSITIVE COCCI CORRECTED RESULTS PREVIOUSLY REPORTED AS: RARE YEAST CORRECTED RESULTS CALLED TO: Jettie Pagan PHARMD @1010  12/09/20 EB    Culture   Final    No growth aerobically or anaerobically. Performed at [facility], [address], [place], [place]    Report Status 12/11/2020 FINAL  Final  Culture, blood (routine x 2)     Status: None   Collection Time: 12/10/20 10:00 PM   Specimen: BLOOD RIGHT HAND  Result Value Ref Range Status   Specimen Description BLOOD RIGHT HAND  Final   Special Requests   Final    BOTTLES DRAWN AEROBIC AND ANAEROBIC Blood Culture adequate volume   Culture   Final    NO GROWTH 5 DAYS Performed at [facility], [address], [place], [place]    Report Status 12/15/2020 FINAL  Final  Culture, blood (routine x 2)     Status: None   Collection Time: 12/10/20 10:15 PM   Specimen: BLOOD LEFT HAND  Result Value Ref Range Status   Specimen Description BLOOD LEFT HAND  Final   Special Requests   Final    BOTTLES DRAWN AEROBIC AND ANAEROBIC Blood Culture adequate volume   Culture   Final    NO GROWTH 5 DAYS Performed at [facility], [address], [place], [place]    Report Status 12/15/2020 FINAL  Final  Culture, Urine     Status: None   Collection Time: 12/11/20 10:38 AM   Specimen: Urine, Clean Catch  Result Value Ref Range Status   Specimen Description URINE, CLEAN CATCH  Final    Special Requests DAPTOMYCIN  Final   Culture   Final    NO GROWTH Performed at [facility], [address] [place], [place]    Report Status 12/12/2020 FINAL  Final    Studies/Results: ECHOCARDIOGRAM LIMITED  Result Date: 12/15/2020    ECHOCARDIOGRAM LIMITED REPORT   Patient Name:   KYAH BUESING Date  of Exam: 12/15/2020 Medical Rec #:  [id number]   Height:       67.0 in Accession #:    [id number]  Weight:       165.8 lb Date of Birth:  1986-01-27    BSA:          1.867 m Patient Age:    35 years    BP:           132 /71 mmHg Patient Gender: F           HR:           93 bpm. Exam Location:  Inpatient Procedure: Limited Echo, Limited Color Doppler and Color Doppler Indications:    Endocarditis  History:        Patient has prior history of Echocardiogram examinations, most                 recent 11/17/2020. Signs/Symptoms:vegetation.  Sonographer:    Neomia Dear RDCS Referring Phys: [id number] ELLIS  Sonographer Comments: Technically challenging study due to limited acoustic windows. IMPRESSIONS  1. Left ventricular ejection fraction, by estimation, is 60 to 65%. The left ventricle has normal function.  2. There is a large mobile density on the Tricuspid valve -c/w endocarditis . Suggest TEE if clinically indicated. The study is not significantly changed from the previous study .     Marland Kitchen Tricuspid valve regurgitation is moderate to severe. FINDINGS  Left Ventricle: Left ventricular ejection fraction, by estimation, is 60 to 65%. The left ventricle has normal function. Pericardium: There is no evidence of pericardial effusion. Tricuspid Valve: There is a large mobile density on the Tricuspid valve -c/w endocarditis . Suggest TEE if clinically indicated. The study is not significantly changed from the previous study. Tricuspid valve regurgitation is moderate to severe.  LV Volumes (MOD) LV vol d, MOD A2C: 91.3 ml LV vol d, MOD A4C: 85.1 ml LV vol s, MOD A2C: 50.4 ml LV vol s, MOD A4C: 29.5  ml LV SV MOD A2C:     40.9 ml LV SV MOD A4C:     85.1 ml LV SV MOD BP:      50.4 ml LEFT ATRIUM           Index       RIGHT ATRIUM           Index LA Vol (A2C): 44.4 ml 23.78 ml/m RA Area:     17.00 cm                                   RA Volume:   47.70 ml  25.55 ml/m  TRICUSPID VALVE TR Peak grad:   14.4 mmHg TR Vmax:        190.00 cm/s Kristeen Miss MD Electronically signed by Kristeen Miss MD Signature Date/Time: 12/15/2020/5:13:49 PM    Final       Assessment/Plan:  INTERVAL HISTORY:  Her [place] is reviewed transthoracic echocardiogram and due to the location of the vegetation she is not a candidate for angio VAC     Principal Problem:   Endocarditis of tricuspid valve Active Problems:   Severe sepsis due to methicillin resistant Staphylococcus aureus (MRSA) with acute organ dysfunction (HCC)   Poor dentition   Malnutrition (HCC)   Thrombocytopenia (HCC)   Tobacco dependence   Acute cystitis without hematuria   Malnutrition of moderate degree   Abdominal pain  Left foot pain   Septic embolism (HCC)   Staphylococcal arthritis of right shoulder (HCC)   MRSA bacteremia   Acute on chronic respiratory failure with hypoxia (HCC)   Sepsis (HCC)    Milbert CoulterLaura Prue is a 35 y.o. female with IV drug use MRSA bacteremia tricuspid had about tricuspid valve endocarditis with septic emboli to the lungs right septic shoulder status post orthopedic surgery which disclosed all to pull subdeltoid abscesses within the right side.  She has had I&D.  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  I have asked her to try to stay for 6 weeks from the date of her negative blood cultures  because I am worried about her endocarditis and treating it suboptimally.  With her cultures having cleared in February by my math she should be done with 6 weeks of IV therapy on 31 March.  At that point in time if she does not want to stay further we can change her over to oral therapy and make sure she completes at least 8 weeks of postoperative antibiotics for her shoulder.   #2 septic shoulder with MRSA  See above  I will sign off for now but please call us back as she nears the 31st or if she insists on leaving the hospital before then.  I spent greater than 35 minutes with the patient including greater than 50% of time in face to face counsel of the patient and in coordination of their care.    LOS: 31 days   Acey LavCornelius Van Dam 12/17/2020, 11:13 AM

## 2020-12-17 NOTE — Progress Notes (Signed)
PROGRESS NOTE    Anne Bautista  ZWC:[id number] DOB: Jan 14, 1986 DOA: 11/15/2020 PCP: Patient, No Pcp Per    Chief Complaint  Patient presents with  . Generalized Body Aches    Brief Narrative:  Anne Bautista was admitted to the hospital with a working diagnosis of tricuspid valve endocarditis with MRSA bacteremia complicated with severe sepsis, septic emboli to the lungs and kidneys.  Right shoulder septic arthritis, right subdeltoid abscess. Patient with prolonged hospitalization, plan to complete IV antibiotics inpatient until April 31.  35 year old female past medical history for intravenous drug abuse, who presented with cough and myalgias.  Positive right shoulder pain, associated with dyspnea and fever.  Ongoing symptoms for about 7 days.  Recent IV drug abuse about 3 days prior to hospitalization.  She was initially evaluated at [facility] then transferred to [facility] for further management.   Her vitals at the time of transfer include a blood pressure of 131/79, heart rate 124, respiratory rate 46, temperature 98.8, oxygen saturation 96%.  Heart S1-S2, present, tachycardic, lungs with scattered rhonchi bilaterally, increased work of breathing, abdomen soft non tender, positive track marks bilateral upper extremities and left lower extremity.  No edema.  She was diagnosed with sepsis and was admitted to the intensive care unit.  Her blood cultures were positive for MRSA, further work-up revealed tricuspid valve endocarditis.  She was found to have septic emboli to her lungs and kidneys.  During her hospitalization she was found to have right shoulder septic arthritis, underwent I&D 3/5.  Note patient has a PICC line which was placed 2/28, plan to continue antibiotic therapy with daptomycin till 01/19/2021.  Pending final decision about angio VAC debridement per CT surgery.   Right shoulder drain has been removed on 3.10. to remove sutures on until 03/17.     Assessment & Plan:   Principal  Problem:   Endocarditis of tricuspid valve Active Problems:   Severe sepsis due to methicillin resistant Staphylococcus aureus (MRSA) with acute organ dysfunction (HCC)   Poor dentition   Malnutrition (HCC)   Thrombocytopenia (HCC)   Tobacco dependence   Acute cystitis without hematuria   Malnutrition of moderate degree   Abdominal pain   Left foot pain   Septic embolism (HCC)   Staphylococcal arthritis of right shoulder (HCC)   MRSA bacteremia   Acute on chronic respiratory failure with hypoxia (HCC)   Sepsis (HCC)  1 tricuspid valve MRSA endocarditis, complicated with bacteremia (disseminated MRSA infection) and severe sepsis.  Right shoulder septic arthritis and right subdeltoid abscess, POA/positive emboli septic to lungs and kidneys -Secondary to IV drug use  -slowly improving clinically -Patient has been reassessed by CT surgery, and noted to have vegetations on the tricuspid valve and not ideal for angio VAC debridement at this time.  Patient with severe TR. -Per CT surgery patient will be reassessed for valve replacement once she has completed rehab. -ID following and recommending continuation of IV daptomycin. -ID spoke with patient and patient noted to have told ID she did not think she could stay in the hospital for 6 weeks for IV antibiotics postop. -ID recommended antibiotics for 6 weeks from the date of negative blood cultures due to concerns of endocarditis will be treated suboptimally if less. -Per IDs calculation patient should be done with 6 weeks of therapy on January 01, 2021. -Per ID on March 31 if patient does not want to stay any further she could be changed to oral antibiotics/oral therapy to complete at least 8  weeks of postop antibiotics for her shoulder. -Appreciate ID input and recommendations.  2.  Acute hypoxemic respiratory failure Secondary to bilateral septic emboli. -Clinically improved. -Patient with sats of 97% on room air -Continue IV  daptomycin. -Continue incentive spirometry.  3.  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  4.  AKI with ATN/hypokalemia/hypomagnesemia Renal function improved.  Tolerating oral intake. -Good urine output. -Follow.  5.  Acute postop blood loss anemia/iron deficiency anemia Right shoulder drain has been removed. -Hemoglobin currently stable at 7.2. -May need IV iron however will discuss with ID in light of patient's endocarditis/septic emboli. -Transfusion threshold hemoglobin < 7.  6.  Moderate protein calorie malnutrition -Nutritional supplementation.  7.  Hypertension Continue Norvasc.  Clonidine.  8.  Depression/anxiety Seroquel.  9.  Migraine headache On Excedrin Migraine as needed.  10.  Debility/deconditioning PT/OT.  DVT prophylaxis: Lovenox Code Status: Full Family Communication: Updated patient.  No family at bedside Disposition:   Status is: Inpatient    Dispo: The patient is from: Home              Anticipated d/c is to: SNF versus home with home health              Patient currently on IV daptomycin, will need to stay hospitalized until completion of IV antibiotics.  Not safe to be discharged home on IV antibiotics due to history of IV drug abuse.  Not stable for discharge.   Difficult to place patient yes       Consultants:   ID: Dr. Drue Second 11/16/2020  PCCM: Dr. Chestine Spore 11/17/2020  Orthopedics: Dr. Susa Simmonds 11/17/2020,/12/06/2020  CT surgery: Dr. Cliffton Asters 11/19/2020  Nephrology: Dr. Melanee Spry 11/21/2020    Procedures:   2D echo 11/17/2020, 12/15/2020  Lower extremity Dopplers 11/17/2020  Left lower extremity soft tissue ultrasound 11/27/2020  Chest x-ray 12/10/2020, 11/25/2020, 11/24/2020, 11/23/2020, 11/20/2020, 11/17/2020, 11/15/2020  Abdominal films 12/01/2020  CT head 11/15/2020  CT angiogram chest 11/15/2020  CT abdomen and pelvis 11/15/2020  Irrigation and debridement  right shoulder abscess 12/06/2020  PICC line 12/01/2020  Antimicrobials:  Anti-infectives (From admission, onward)   Start     Dose/Rate Route Frequency Ordered Stop   12/12/20 2000  DAPTOmycin (CUBICIN) 750 mg in sodium chloride 0.9 % IVPB        750 mg 230 mL/hr over 30 Minutes Intravenous Daily 12/12/20 0719 01/19/21 2359   12/09/20 1200  anidulafungin (ERAXIS) 100 mg in sodium chloride 0.9 % 100 mL IVPB  Status:  Discontinued        100 mg 78 mL/hr over 100 Minutes Intravenous Every 24 hours 12/08/20 1109 12/09/20 1013   12/08/20 1200  anidulafungin (ERAXIS) 200 mg in sodium chloride 0.9 % 200 mL IVPB        200 mg 78 mL/hr over 200 Minutes Intravenous  Once 12/08/20 1109 12/08/20 1724   12/06/20 0833  vancomycin (VANCOCIN) powder  Status:  Discontinued          As needed 12/06/20 0834 12/06/20 0852   12/06/20 0600  ceFAZolin (ANCEF) IVPB 2g/100 mL premix        2 g 200 mL/hr over 30 Minutes Intravenous On call to O.R. 12/06/20 0306 12/06/20 0755   11/28/20 2000  DAPTOmycin (CUBICIN) 764 mg in sodium chloride 0.9 % IVPB  Status:  Discontinued        10 mg/kg  76.4 kg 230.6 mL/hr over 30 Minutes Intravenous Daily 11/28/20 0727  12/12/20 0719   11/24/20 2000  DAPTOmycin (CUBICIN) 827 mg in sodium chloride 0.9 % IVPB  Status:  Discontinued        10 mg/kg  82.7 kg 233.1 mL/hr over 30 Minutes Intravenous Daily 11/24/20 0821 11/28/20 0727   11/19/20 1000  DAPTOmycin (CUBICIN) 770 mg in sodium chloride 0.9 % IVPB  Status:  Discontinued        10 mg/kg  77 kg 230.8 mL/hr over 30 Minutes Intravenous Daily 11/19/20 0908 11/24/20 0821   11/19/20 1000  doxycycline (VIBRA-TABS) tablet 100 mg        100 mg Oral Every 12 hours 11/19/20 0908 11/23/20 2123   11/19/20 0439  vancomycin variable dose per unstable renal function (pharmacist dosing)  Status:  Discontinued         Does not apply See admin instructions 11/19/20 0439 11/19/20 0853   11/16/20 2000  vancomycin (VANCOREADY) IVPB 1250  mg/250 mL  Status:  Discontinued        1,250 mg 166.7 mL/hr over 90 Minutes Intravenous Every 24 hours 11/15/20 1933 11/16/20 1108   11/16/20 1200  vancomycin (VANCOCIN) IVPB 1000 mg/200 mL premix  Status:  Discontinued        1,000 mg 200 mL/hr over 60 Minutes Intravenous Every 12 hours 11/16/20 1108 11/19/20 0439   11/16/20 0745  metroNIDAZOLE (FLAGYL) IVPB 500 mg        500 mg 100 mL/hr over 60 Minutes Intravenous  Once 11/16/20 0741 11/16/20 0907   11/15/20 2237  vancomycin (VANCOCIN) 500 MG powder       Note to Pharmacy: [name], [name]   : cabinet override      11/15/20 2237 11/16/20 1044   11/15/20 2000  ceFEPIme (MAXIPIME) 2 g in sodium chloride 0.9 % 100 mL IVPB  Status:  Discontinued        2 g 200 mL/hr over 30 Minutes Intravenous Every 12 hours 11/15/20 1933 11/16/20 1212   11/15/20 1945  vancomycin (VANCOREADY) IVPB 1500 mg/300 mL  Status:  Discontinued        1,500 mg 150 mL/hr over 120 Minutes Intravenous  Once 11/15/20 1933 11/15/20 1939   11/15/20 1945  vancomycin (VANCOCIN) IVPB 1000 mg/200 mL premix       "And" Linked Group Details   1,000 mg 200 mL/hr over 60 Minutes Intravenous  Once 11/15/20 1939 11/15/20 2231   11/15/20 1945  vancomycin (VANCOREADY) IVPB 500 mg/100 mL       "And" Linked Group Details   500 mg 100 mL/hr over 60 Minutes Intravenous  Once 11/15/20 1939 11/16/20 0002        Subjective: Patient sitting up in bed.  Denies any significant chest pain.  No shortness of breath.  Still with pain in the right shoulder.  Asking when stitches will be removed.  Objective: Vitals:   12/17/20 0026 12/17/20 0428 12/17/20 0841 12/17/20 1131  BP: 116/69 131/71 119/76 123/68  Pulse: 97 98 100 96  Resp: 16 19 20 16   Temp: 98.8 F (37.1 C) 98.9 F (37.2 C) 99.2 F (37.3 C) 99 F (37.2 C)  TempSrc:   Oral Oral  SpO2: 94% 95% 95% 95%  Weight:      Height:        Intake/Output Summary (Last 24 hours) at 12/17/2020 1258 Last data filed at 12/16/2020  2215 Gross per 24 hour  Intake 365 ml  Output -  Net 365 ml   Filed Weights   12/01/20 0328 12/02/20 0300  12/16/20 2100  Weight: 74 kg 75.2 kg 66.6 kg    Examination:  General exam: Appears calm and comfortable  Respiratory system: Clear to auscultation. Respiratory effort normal. Cardiovascular system: RRR with 3/6 SEM left sternal border.  No lower extremity edema.  Gastrointestinal system: Abdomen is nondistended, soft and nontender. No organomegaly or masses felt. Normal bowel sounds heard. Central nervous system: Alert and oriented. No focal neurological deficits. Extremities: Symmetric 5 x 5 power. Skin: No rashes, lesions or ulcers Psychiatry: Judgement and insight appear normal. Mood & affect appropriate.     Data Reviewed: I have personally reviewed following labs and imaging studies  CBC: Recent Labs  Lab 12/14/20 0420 12/17/20 1102  WBC 11.1* 11.0*  NEUTROABS 6.5 6.6  HGB 7.3* 7.2*  HCT 22.6* 23.0*  MCV 84.3 84.6  PLT 331 311    Basic Metabolic Panel: Recent Labs  Lab 12/14/20 0420 12/17/20 1102  NA 137 135  K 3.2* 3.7  CL 105 103  CO2 25 25  GLUCOSE 102* 101*  BUN 9 11  CREATININE 1.21* 1.23*  CALCIUM 8.2* 8.4*  MG 1.7 1.9    GFR: Estimated Creatinine Clearance: 61.5 mL/min (A) (by C-G formula based on SCr of 1.23 mg/dL (H)).  Liver Function Tests: No results for input(s): AST, ALT, ALKPHOS, BILITOT, PROT, ALBUMIN in the last 168 hours.  CBG: No results for input(s): GLUCAP in the last 168 hours.   Recent Results (from the past 240 hour(s))  Culture, blood (routine x 2)     Status: None   Collection Time: 12/10/20 10:00 PM   Specimen: BLOOD RIGHT HAND  Result Value Ref Range Status   Specimen Description BLOOD RIGHT HAND  Final   Special Requests   Final    BOTTLES DRAWN AEROBIC AND ANAEROBIC Blood Culture adequate volume   Culture   Final    NO GROWTH 5 DAYS Performed at [facility], [address] [place], [place]  [address]    Report Status 12/15/2020 FINAL  Final  Culture, blood (routine x 2)     Status: None   Collection Time: 12/10/20 10:15 PM   Specimen: BLOOD LEFT HAND  Result Value Ref Range Status   Specimen Description BLOOD LEFT HAND  Final   Special Requests   Final    BOTTLES DRAWN AEROBIC AND ANAEROBIC Blood Culture adequate volume   Culture   Final    NO GROWTH 5 DAYS Performed at [facility], [address], [place], [place] [address]    Report Status 12/15/2020 FINAL  Final  Culture, Urine     Status: None   Collection Time: 12/11/20 10:38 AM   Specimen: Urine, Clean Catch  Result Value Ref Range Status   Specimen Description URINE, CLEAN CATCH  Final   Special Requests DAPTOMYCIN  Final   Culture   Final    NO GROWTH Performed at [facility], [address] [place], [place] [address]    Report Status 12/12/2020 FINAL  Final         Radiology Studies: ECHOCARDIOGRAM LIMITED  Result Date: 12/15/2020    ECHOCARDIOGRAM LIMITED REPORT   Patient Name:   Anne Bautista Date of Exam: 12/15/2020 Medical Rec #:  [id number]   Height:       67.0 in Accession #:    [id number]  Weight:       165.8 lb Date of Birth:  10/24/1985    BSA:  1.867 m Patient Age:    35 years    BP:           132 /71 mmHg Patient Gender: F           HR:           93 bpm. Exam Location:  Inpatient Procedure: Limited Echo, Limited Color Doppler and Color Doppler Indications:    Endocarditis  History:        Patient has prior history of Echocardiogram examinations, most                 recent 11/17/2020. Signs/Symptoms:vegetation.  Sonographer:    Neomia Dear RDCS Referring Phys: [id number] Darting ELLIS  Sonographer Comments: Technically challenging study due to limited acoustic windows. IMPRESSIONS  1. Left ventricular ejection fraction, by estimation, is 60 to 65%. The left ventricle has normal function.  2. There is a large mobile density on the Tricuspid valve -c/w endocarditis . Suggest TEE if  clinically indicated. The study is not significantly changed from the previous study .     Marland Kitchen Tricuspid valve regurgitation is moderate to severe. FINDINGS  Left Ventricle: Left ventricular ejection fraction, by estimation, is 60 to 65%. The left ventricle has normal function. Pericardium: There is no evidence of pericardial effusion. Tricuspid Valve: There is a large mobile density on the Tricuspid valve -c/w endocarditis . Suggest TEE if clinically indicated. The study is not significantly changed from the previous study. Tricuspid valve regurgitation is moderate to severe.  LV Volumes (MOD) LV vol d, MOD A2C: 91.3 ml LV vol d, MOD A4C: 85.1 ml LV vol s, MOD A2C: 50.4 ml LV vol s, MOD A4C: 29.5 ml LV SV MOD A2C:     40.9 ml LV SV MOD A4C:     85.1 ml LV SV MOD BP:      50.4 ml LEFT ATRIUM           Index       RIGHT ATRIUM           Index LA Vol (A2C): 44.4 ml 23.78 ml/m RA Area:     17.00 cm                                   RA Volume:   47.70 ml  25.55 ml/m  TRICUSPID VALVE TR Peak grad:   14.4 mmHg TR Vmax:        190.00 cm/s Kristeen Miss MD Electronically signed by Kristeen Miss MD Signature Date/Time: 12/15/2020/5:13:49 PM    Final         Scheduled Meds: . amLODipine  10 mg Oral Daily  . chlorhexidine  15 mL Mouth Rinse BID  . Chlorhexidine Gluconate Cloth  6 each Topical Daily  . cloNIDine  0.1 mg Oral BID  . dicyclomine  20 mg Oral TID AC & HS  . docusate sodium  100 mg Oral BID  . enoxaparin (LOVENOX) injection  40 mg Subcutaneous Q24H  . feeding supplement  237 mL Oral QID  . folic acid  1 mg Oral Daily  . mouth rinse  15 mL Mouth Rinse q12n4p  . methadone  10 mg Oral Q8H  . multivitamin with minerals  1 tablet Oral Daily  . pantoprazole  40 mg Oral Daily  . polyethylene glycol  17 g Oral BID  . QUEtiapine  25 mg Oral BID  . senna  2  tablet Oral Daily  . sodium chloride flush  10-40 mL Intracatheter Q12H  . thiamine  100 mg Oral Daily   Continuous Infusions: . sodium  chloride Stopped (12/05/20 0656)  . DAPTOmycin (CUBICIN)  IV Stopped (12/16/20 2215)  . lactated ringers 10 mL/hr at 12/06/20 0730     LOS: 31 days    Time spent: 40 minutes    Ramiro Harvest, MD [facility]   To contact the attending provider between 7A-7P or the covering provider during after hours 7P-7A, please log into the web site [email or web address] and access using universal [place] password for that web site. If you do not have the password, please call the hospital operator.  12/17/2020, 12:58 PM

## 2020-12-18 LAB — CBC WITH DIFFERENTIAL/PLATELET
Abs Immature Granulocytes: 0.06 10*3/uL (ref 0.00–0.07)
Basophils Absolute: 0.1 10*3/uL (ref 0.0–0.1)
Basophils Relative: 1 %
Eosinophils Absolute: 0.1 10*3/uL (ref 0.0–0.5)
Eosinophils Relative: 1 %
HCT: 23.6 % — ABNORMAL LOW (ref 36.0–46.0)
Hemoglobin: 7.4 g/dL — ABNORMAL LOW (ref 12.0–15.0)
Immature Granulocytes: 1 %
Lymphocytes Relative: 32 %
Lymphs Abs: 3.2 10*3/uL (ref 0.7–4.0)
MCH: 26.5 pg (ref 26.0–34.0)
MCHC: 31.4 g/dL (ref 30.0–36.0)
MCV: 84.6 fL (ref 80.0–100.0)
Monocytes Absolute: 0.9 10*3/uL (ref 0.1–1.0)
Monocytes Relative: 9 %
Neutro Abs: 5.8 10*3/uL (ref 1.7–7.7)
Neutrophils Relative %: 56 %
Platelets: 306 10*3/uL (ref 150–400)
RBC: 2.79 MIL/uL — ABNORMAL LOW (ref 3.87–5.11)
RDW: 17.2 % — ABNORMAL HIGH (ref 11.5–15.5)
WBC: 10.1 10*3/uL (ref 4.0–10.5)
nRBC: 0 % (ref 0.0–0.2)

## 2020-12-18 LAB — RENAL FUNCTION PANEL
Albumin: 1.9 g/dL — ABNORMAL LOW (ref 3.5–5.0)
Anion gap: 9 (ref 5–15)
BUN: 13 mg/dL (ref 6–20)
CO2: 25 mmol/L (ref 22–32)
Calcium: 8.5 mg/dL — ABNORMAL LOW (ref 8.9–10.3)
Chloride: 101 mmol/L (ref 98–111)
Creatinine, Ser: 1.28 mg/dL — ABNORMAL HIGH (ref 0.44–1.00)
GFR, Estimated: 56 mL/min — ABNORMAL LOW (ref >60)
Glucose, Bld: 101 mg/dL — ABNORMAL HIGH (ref 70–99)
Phosphorus: 5.1 mg/dL — ABNORMAL HIGH (ref 2.5–4.6)
Potassium: 3.7 mmol/L (ref 3.5–5.1)
Sodium: 135 mmol/L (ref 135–145)

## 2020-12-18 LAB — MAGNESIUM: Magnesium: 2.1 mg/dL (ref 1.7–2.4)

## 2020-12-18 MED ORDER — BOOST / RESOURCE BREEZE PO LIQD CUSTOM
1.0000 | Freq: Two times a day (BID) | ORAL | Status: DC
  Administered 2020-12-18 – 2020-12-25 (×9): 1 via ORAL
  Filled 2020-12-18 (×4): qty 1

## 2020-12-18 MED ORDER — ENSURE ENLIVE PO LIQD
237.0000 mL | Freq: Two times a day (BID) | ORAL | Status: DC
  Administered 2020-12-19 – 2020-12-25 (×6): 237 mL via ORAL

## 2020-12-18 NOTE — Progress Notes (Signed)
Nutrition Follow-up  DOCUMENTATION CODES:   Non-severe (moderate) malnutrition in context of social or environmental circumstances  INTERVENTION:   Boost Breeze po BID, each supplement provides 250 kcal and 9 grams of protein  Decrease to Ensure Enlive po BID, each supplement provides 350 kcal and 20 grams of protein  Continue Magic cup BID with meals, each supplement provides 290 kcal and 9 grams of protein  Continue MVI with minerals daily  NUTRITION DIAGNOSIS:   Moderate Malnutrition related to social / environmental circumstances (IVDU) as evidenced by mild fat depletion,moderate fat depletion,mild muscle depletion,moderate muscle depletion.  ongoing  GOAL:   Patient will meet greater than or equal to 90% of their needs  progressing  MONITOR:   PO intake,Supplement acceptance,Labs,Weight trends,Skin,I & O's  REASON FOR ASSESSMENT:   Consult Assessment of nutrition requirement/status,Poor PO  ASSESSMENT:   14 YOF admitted for severe sepsis w/ MRSA acute organ dysfunction. Septic emboli to both lungs, kidneys, R should, L ankle. Hx of IVDU, acute resp failure, tachypnea, AKI, cellulitis on L ankle, chronic constipation.  02/20 - SLP eval, recommendation for full liquid diet  02/23 - SLP eval, Dysphagia 1 diet with thin liquids 02/26 - diet advanced to dysphagia 2, thin liquids 3/3- cortrak tube removed; s/p MRI of rt shoulder showed R subdeltoid abscess and reactive marrow edema 3/4- s/p BSE- advanced to regular diet with thin liquids  3/5- s/p I&D of R shoulder subdeltoid abscess w/ multiple cavities 3/10 R shoulder drain removed 3/14 repeat echocardiogram revealed persistent tricuspid valve endocarditis  Per NP, pt's tricuspid valve endocarditis is not amenable to angio vac due to location. It has been recommended pt follow-up in outpatient setting. NP notes pt is hemodynamically stable without any evidence of acute heart failure.   Pt unavailable at time of  RD visit.   Limited meal documentation since last RD assessment -- 1 meal documented as 90% completion. Discussed pt with RN who reports pt refused dinner last night and breakfast today. RN also reports pt has been refusing some Ensure; suspect pt is growing tired of the lack of variety. Will adjust supplement regimen in hopes of increasing pt's acceptance. RN to stress importance of adequate intake to pt given RD was not able to reach pt today. Will follow-up as able.   No UOP documented  Medications: colace, folvite mvi with minerals, protonix, miralax, senokot, thiamine Labs: PO4 5.1 (H)  Diet Order:   Diet Order            Diet regular Room service appropriate? Yes; Fluid consistency: Thin  Diet effective now                 EDUCATION NEEDS:   No education needs have been identified at this time  Skin:  Skin Assessment: Skin Integrity Issues: Skin Integrity Issues:: Incisions Incisions: closed incision R shoulder  Last BM:  3/15  Height:   Ht Readings from Last 1 Encounters:  11/16/20 5\' 7"  (1.702 m)    Weight:   Wt Readings from Last 1 Encounters:  12/16/20 66.6 kg    Ideal Body Weight:  61.4 kg  BMI:  Body mass index is 23 kg/m.  Estimated Nutritional Needs:   Kcal:  2100-2300  Protein:  115-130 grams  Fluid:  > 2 L    12/18/20, MS, RD, LDN RD pager number and weekend/on-call pager number located in [place].

## 2020-12-18 NOTE — Progress Notes (Addendum)
PT Cancellation Note  Patient Details Name: Anne Bautista MRN: [id number] DOB: 12-30-1985   Cancelled Treatment:    Reason Eval/Treat Not Completed: Patient declined, no reason specified Pt refused stating she walked too long earlier today and needs to rest. Pt's eyes remained closed during education regarding participating in therapy. Will attempt in PM time permitting  Attempted 1310 with pt continued to refuse and eye remained closed   Ginette Otto, DPT Acute Rehabilitation Services [phone]   Lucretia Field 12/18/2020, 10:48 AM

## 2020-12-18 NOTE — Progress Notes (Signed)
PROGRESS NOTE    Anne Bautista  ZWC:[id number] DOB: Jan 14, 1986 DOA: 11/15/2020 PCP: Patient, No Pcp Per    Chief Complaint  Patient presents with  . Generalized Body Aches    Brief Narrative:  Anne Bautista was admitted to the hospital with a working diagnosis of tricuspid valve endocarditis with MRSA bacteremia complicated with severe sepsis, septic emboli to the lungs and kidneys.  Right shoulder septic arthritis, right subdeltoid abscess. Patient with prolonged hospitalization, plan to complete IV antibiotics inpatient until April 31.  35 year old female past medical history for intravenous drug abuse, who presented with cough and myalgias.  Positive right shoulder pain, associated with dyspnea and fever.  Ongoing symptoms for about 7 days.  Recent IV drug abuse about 3 days prior to hospitalization.  She was initially evaluated at [facility] then transferred to [facility] for further management.   Her vitals at the time of transfer include a blood pressure of 131/79, heart rate 124, respiratory rate 46, temperature 98.8, oxygen saturation 96%.  Heart S1-S2, present, tachycardic, lungs with scattered rhonchi bilaterally, increased work of breathing, abdomen soft non tender, positive track marks bilateral upper extremities and left lower extremity.  No edema.  She was diagnosed with sepsis and was admitted to the intensive care unit.  Her blood cultures were positive for MRSA, further work-up revealed tricuspid valve endocarditis.  She was found to have septic emboli to her lungs and kidneys.  During her hospitalization she was found to have right shoulder septic arthritis, underwent I&D 3/5.  Note patient has a PICC line which was placed 2/28, plan to continue antibiotic therapy with daptomycin till 01/19/2021.  Pending final decision about angio VAC debridement per CT surgery.   Right shoulder drain has been removed on 3.10. to remove sutures on until 03/17.     Assessment & Plan:   Principal  Problem:   Endocarditis of tricuspid valve Active Problems:   Severe sepsis due to methicillin resistant Staphylococcus aureus (MRSA) with acute organ dysfunction (HCC)   Poor dentition   Malnutrition (HCC)   Thrombocytopenia (HCC)   Tobacco dependence   Acute cystitis without hematuria   Malnutrition of moderate degree   Abdominal pain   Left foot pain   Septic embolism (HCC)   Staphylococcal arthritis of right shoulder (HCC)   MRSA bacteremia   Acute on chronic respiratory failure with hypoxia (HCC)   Sepsis (HCC)  1 tricuspid valve MRSA endocarditis, complicated with bacteremia (disseminated MRSA infection) and severe sepsis.  Right shoulder septic arthritis and right subdeltoid abscess, POA/positive emboli septic to lungs and kidneys -Secondary to IV drug use  -slowly improving clinically -Patient has been reassessed by CT surgery, and noted to have vegetations on the tricuspid valve and not ideal for angio VAC debridement at this time.  Patient with severe TR. -Per CT surgery patient will be reassessed for valve replacement once she has completed rehab. -ID following and recommending continuation of IV daptomycin. -ID spoke with patient and patient noted to have told ID she did not think she could stay in the hospital for 6 weeks for IV antibiotics postop. -ID recommended antibiotics for 6 weeks from the date of negative blood cultures due to concerns of endocarditis will be treated suboptimally if less. -Per IDs calculation patient should be done with 6 weeks of therapy on January 01, 2021. -Per ID on March 31 if patient does not want to stay any further she could be changed to oral antibiotics/oral therapy to complete at least 8  weeks of postop antibiotics for her shoulder. -Appreciate ID input and recommendations.  2.  Acute hypoxemic respiratory failure Secondary to bilateral septic emboli. -Clinically improved. -Patient with sats of 96% on room air -Continue IV  daptomycin. -Continue incentive spirometry.  3.  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   -Continue clonidine twice daily, lorazepam, thiamine.   4.  AKI with ATN/hypokalemia/hypomagnesemia Renal function improved.  Tolerating oral intake. -Good urine output. -Follow.  5.  Acute postop blood loss anemia/iron deficiency anemia Right shoulder drain has been removed. -Hemoglobin stable at 7.4.  -May need IV iron however will discuss with ID in light of patient's endocarditis/septic emboli. -Transfusion threshold hemoglobin < 7.  6.  Moderate protein calorie malnutrition -Nutritional supplementation.  7.  Hypertension Controlled on current regimen of Norvasc, clonidine.  8.  Depression/anxiety Continue Seroquel.    9.  Migraine headache On Excedrin Migraine as needed.  10.  Debility/deconditioning PT/OT.   DVT prophylaxis: Lovenox Code Status: Full Family Communication: Updated patient.  No family at bedside Disposition:   Status is: Inpatient    Dispo: The patient is from: Home              Anticipated d/c is to: SNF versus home with home health              Patient currently on IV daptomycin, will need to stay hospitalized until completion of IV antibiotics.  Not safe to be discharged home on IV antibiotics due to history of IV drug abuse.  Not stable for discharge.   Difficult to place patient yes       Consultants:   ID: Dr. Drue Second 11/16/2020  PCCM: Dr. Chestine Spore 11/17/2020  Orthopedics: Dr. Susa Simmonds 11/17/2020,/12/06/2020  CT surgery: Dr. Cliffton Asters 11/19/2020  Nephrology: Dr. Melanee Spry 11/21/2020    Procedures:   2D echo 11/17/2020, 12/15/2020  Lower extremity Dopplers 11/17/2020  Left lower extremity soft tissue ultrasound 11/27/2020  Chest x-ray 12/10/2020, 11/25/2020, 11/24/2020, 11/23/2020, 11/20/2020, 11/17/2020, 11/15/2020  Abdominal films 12/01/2020  CT head 11/15/2020  CT angiogram chest 11/15/2020  CT abdomen and pelvis  11/15/2020  Irrigation and debridement right shoulder abscess 12/06/2020  PICC line 12/01/2020  Antimicrobials:  Anti-infectives (From admission, onward)   Start     Dose/Rate Route Frequency Ordered Stop   12/12/20 2000  DAPTOmycin (CUBICIN) 750 mg in sodium chloride 0.9 % IVPB        750 mg 230 mL/hr over 30 Minutes Intravenous Daily 12/12/20 0719 01/19/21 2359   12/09/20 1200  anidulafungin (ERAXIS) 100 mg in sodium chloride 0.9 % 100 mL IVPB  Status:  Discontinued        100 mg 78 mL/hr over 100 Minutes Intravenous Every 24 hours 12/08/20 1109 12/09/20 1013   12/08/20 1200  anidulafungin (ERAXIS) 200 mg in sodium chloride 0.9 % 200 mL IVPB        200 mg 78 mL/hr over 200 Minutes Intravenous  Once 12/08/20 1109 12/08/20 1724   12/06/20 0833  vancomycin (VANCOCIN) powder  Status:  Discontinued          As needed 12/06/20 0834 12/06/20 0852   12/06/20 0600  ceFAZolin (ANCEF) IVPB 2g/100 mL premix        2 g 200 mL/hr over 30 Minutes Intravenous On call to O.R. 12/06/20 0306 12/06/20 0755   11/28/20 2000  DAPTOmycin (CUBICIN) 764 mg in sodium chloride 0.9 % IVPB  Status:  Discontinued        10 mg/kg  76.4  kg 230.6 mL/hr over 30 Minutes Intravenous Daily 11/28/20 0727 12/12/20 0719   11/24/20 2000  DAPTOmycin (CUBICIN) 827 mg in sodium chloride 0.9 % IVPB  Status:  Discontinued        10 mg/kg  82.7 kg 233.1 mL/hr over 30 Minutes Intravenous Daily 11/24/20 0821 11/28/20 0727   11/19/20 1000  DAPTOmycin (CUBICIN) 770 mg in sodium chloride 0.9 % IVPB  Status:  Discontinued        10 mg/kg  77 kg 230.8 mL/hr over 30 Minutes Intravenous Daily 11/19/20 0908 11/24/20 0821   11/19/20 1000  doxycycline (VIBRA-TABS) tablet 100 mg        100 mg Oral Every 12 hours 11/19/20 0908 11/23/20 2123   11/19/20 0439  vancomycin variable dose per unstable renal function (pharmacist dosing)  Status:  Discontinued         Does not apply See admin instructions 11/19/20 0439 11/19/20 0853   11/16/20 2000   vancomycin (VANCOREADY) IVPB 1250 mg/250 mL  Status:  Discontinued        1,250 mg 166.7 mL/hr over 90 Minutes Intravenous Every 24 hours 11/15/20 1933 11/16/20 1108   11/16/20 1200  vancomycin (VANCOCIN) IVPB 1000 mg/200 mL premix  Status:  Discontinued        1,000 mg 200 mL/hr over 60 Minutes Intravenous Every 12 hours 11/16/20 1108 11/19/20 0439   11/16/20 0745  metroNIDAZOLE (FLAGYL) IVPB 500 mg        500 mg 100 mL/hr over 60 Minutes Intravenous  Once 11/16/20 0741 11/16/20 0907   11/15/20 2237  vancomycin (VANCOCIN) 500 MG powder       Note to Pharmacy: Ihor Dow   : cabinet override      11/15/20 2237 11/16/20 1044   11/15/20 2000  ceFEPIme (MAXIPIME) 2 g in sodium chloride 0.9 % 100 mL IVPB  Status:  Discontinued        2 g 200 mL/hr over 30 Minutes Intravenous Every 12 hours 11/15/20 1933 11/16/20 1212   11/15/20 1945  vancomycin (VANCOREADY) IVPB 1500 mg/300 mL  Status:  Discontinued        1,500 mg 150 mL/hr over 120 Minutes Intravenous  Once 11/15/20 1933 11/15/20 1939   11/15/20 1945  vancomycin (VANCOCIN) IVPB 1000 mg/200 mL premix       "And" Linked Group Details   1,000 mg 200 mL/hr over 60 Minutes Intravenous  Once 11/15/20 1939 11/15/20 2231   11/15/20 1945  vancomycin (VANCOREADY) IVPB 500 mg/100 mL       "And" Linked Group Details   500 mg 100 mL/hr over 60 Minutes Intravenous  Once 11/15/20 1939 11/16/20 0002       Subjective: Asleep but arousable.  Denies any chest pain or shortness of breath.    Objective: Vitals:   12/18/20 0418 12/18/20 0700 12/18/20 1138 12/18/20 1500  BP: 112/72 116/74 (!) 95/58 114/71  Pulse: 83 93 87 89  Resp:  16 18 17   Temp: (!) 97.3 F (36.3 C) 98.9 F (37.2 C) 97.8 F (36.6 C) 98.7 F (37.1 C)  TempSrc: Oral Oral Oral Oral  SpO2: 97% 98% 96% 98%  Weight:      Height:       No intake or output data in the 24 hours ending 12/18/20 1730 Filed Weights   12/01/20 0328 12/02/20 0300 12/16/20 2100  Weight: 74 kg 75.2  kg 66.6 kg    Examination:  General exam: NAD Respiratory system: CTA B.  No wheezes, no  crackles, no rhonchi.  Normal respiratory effort.   Cardiovascular system: RRR with 3/6 systolic ejection murmur left sternal border.  No lower extremity edema.  Gastrointestinal system: Abdomen is soft, nontender, nondistended, positive bowel sounds.  No rebound.  No guarding. Central nervous system: Alert and oriented. No focal neurological deficits. Extremities: Symmetric 5 x 5 power. Skin: No rashes, lesions or ulcers Psychiatry: Judgement and insight appear normal. Mood & affect appropriate.     Data Reviewed: I have personally reviewed following labs and imaging studies  CBC: Recent Labs  Lab 12/14/20 0420 12/17/20 1102 12/18/20 0656  WBC 11.1* 11.0* 10.1  NEUTROABS 6.5 6.6 5.8  HGB 7.3* 7.2* 7.4*  HCT 22.6* 23.0* 23.6*  MCV 84.3 84.6 84.6  PLT 331 311 306    Basic Metabolic Panel: Recent Labs  Lab 12/14/20 0420 12/17/20 1102 12/18/20 0656  NA 137 135 135  K 3.2* 3.7 3.7  CL 105 103 101  CO2 25 25 25   GLUCOSE 102* 101* 101*  BUN 9 11 13   CREATININE 1.21* 1.23* 1.28*  CALCIUM 8.2* 8.4* 8.5*  MG 1.7 1.9 2.1  PHOS  --   --  5.1*    GFR: Estimated Creatinine Clearance: 59.1 mL/min (A) (by C-G formula based on SCr of 1.28 mg/dL (H)).  Liver Function Tests: Recent Labs  Lab 12/18/20 0656  ALBUMIN 1.9*    CBG: No results for input(s): GLUCAP in the last 168 hours.   Recent Results (from the past 240 hour(s))  Culture, blood (routine x 2)     Status: None   Collection Time: 12/10/20 10:00 PM   Specimen: BLOOD RIGHT HAND  Result Value Ref Range Status   Specimen Description BLOOD RIGHT HAND  Final   Special Requests   Final    BOTTLES DRAWN AEROBIC AND ANAEROBIC Blood Culture adequate volume   Culture   Final    NO GROWTH 5 DAYS Performed at [facility], [address] [place], [place] [address]    Report Status 12/15/2020 FINAL  Final  Culture,  blood (routine x 2)     Status: None   Collection Time: 12/10/20 10:15 PM   Specimen: BLOOD LEFT HAND  Result Value Ref Range Status   Specimen Description BLOOD LEFT HAND  Final   Special Requests   Final    BOTTLES DRAWN AEROBIC AND ANAEROBIC Blood Culture adequate volume   Culture   Final    NO GROWTH 5 DAYS Performed at [facility], [address] [place], [place] [address]    Report Status 12/15/2020 FINAL  Final  Culture, Urine     Status: None   Collection Time: 12/11/20 10:38 AM   Specimen: Urine, Clean Catch  Result Value Ref Range Status   Specimen Description URINE, CLEAN CATCH  Final   Special Requests DAPTOMYCIN  Final   Culture   Final    NO GROWTH Performed at [facility], [address] [place], [place] [address]    Report Status 12/12/2020 FINAL  Final         Radiology Studies: No results found.      Scheduled Meds: . amLODipine  10 mg Oral Daily  . chlorhexidine  15 mL Mouth Rinse BID  . Chlorhexidine Gluconate Cloth  6 each Topical Daily  . cloNIDine  0.1 mg Oral BID  . dicyclomine  20 mg Oral TID AC & HS  . docusate sodium  100 mg Oral BID  . enoxaparin (  LOVENOX) injection  40 mg Subcutaneous Q24H  . feeding supplement  1 Container Oral BID BM  . feeding supplement  237 mL Oral BID BM  . folic acid  1 mg Oral Daily  . mouth rinse  15 mL Mouth Rinse q12n4p  . methadone  10 mg Oral Q8H  . multivitamin with minerals  1 tablet Oral Daily  . pantoprazole  40 mg Oral Daily  . polyethylene glycol  17 g Oral BID  . QUEtiapine  25 mg Oral BID  . senna  2 tablet Oral Daily  . sodium chloride flush  10-40 mL Intracatheter Q12H  . thiamine  100 mg Oral Daily   Continuous Infusions: . sodium chloride Stopped (12/17/20 2352)  . DAPTOmycin (CUBICIN)  IV Stopped (12/17/20 2353)  . lactated ringers 10 mL/hr at 12/17/20 2351     LOS: 32 days    Time spent: 35 minutes    Ramiro Harvest, MD [facility]   To contact the  attending provider between 7A-7P or the covering provider during after hours 7P-7A, please log into the web site [email or web address] and access using universal [place] password for that web site. If you do not have the password, please call the hospital operator.  12/18/2020, 5:30 PM

## 2020-12-19 LAB — BASIC METABOLIC PANEL
Anion gap: 8 (ref 5–15)
BUN: 15 mg/dL (ref 6–20)
CO2: 24 mmol/L (ref 22–32)
Calcium: 8.4 mg/dL — ABNORMAL LOW (ref 8.9–10.3)
Chloride: 102 mmol/L (ref 98–111)
Creatinine, Ser: 1.32 mg/dL — ABNORMAL HIGH (ref 0.44–1.00)
GFR, Estimated: 54 mL/min — ABNORMAL LOW (ref >60)
Glucose, Bld: 94 mg/dL (ref 70–99)
Potassium: 3.7 mmol/L (ref 3.5–5.1)
Sodium: 134 mmol/L — ABNORMAL LOW (ref 135–145)

## 2020-12-19 LAB — PREPARE RBC (CROSSMATCH)

## 2020-12-19 LAB — CBC WITH DIFFERENTIAL/PLATELET
Abs Immature Granulocytes: 0.04 10*3/uL (ref 0.00–0.07)
Basophils Absolute: 0 10*3/uL (ref 0.0–0.1)
Basophils Relative: 0 %
Eosinophils Absolute: 0.1 10*3/uL (ref 0.0–0.5)
Eosinophils Relative: 1 %
HCT: 21.5 % — ABNORMAL LOW (ref 36.0–46.0)
Hemoglobin: 6.8 g/dL — CL (ref 12.0–15.0)
Immature Granulocytes: 0 %
Lymphocytes Relative: 33 %
Lymphs Abs: 3.1 10*3/uL (ref 0.7–4.0)
MCH: 26.6 pg (ref 26.0–34.0)
MCHC: 31.6 g/dL (ref 30.0–36.0)
MCV: 84 fL (ref 80.0–100.0)
Monocytes Absolute: 0.8 10*3/uL (ref 0.1–1.0)
Monocytes Relative: 9 %
Neutro Abs: 5.1 10*3/uL (ref 1.7–7.7)
Neutrophils Relative %: 57 %
Platelets: 306 10*3/uL (ref 150–400)
RBC: 2.56 MIL/uL — ABNORMAL LOW (ref 3.87–5.11)
RDW: 17.2 % — ABNORMAL HIGH (ref 11.5–15.5)
WBC: 9.2 10*3/uL (ref 4.0–10.5)
nRBC: 0 % (ref 0.0–0.2)

## 2020-12-19 MED ORDER — FUROSEMIDE 10 MG/ML IJ SOLN
20.0000 mg | Freq: Once | INTRAMUSCULAR | Status: AC
  Administered 2020-12-19: 20 mg via INTRAVENOUS
  Filled 2020-12-19: qty 4

## 2020-12-19 MED ORDER — SODIUM CHLORIDE 0.9% IV SOLUTION: Freq: Once | INTRAVENOUS | Status: DC

## 2020-12-19 MED ORDER — ACETAMINOPHEN 325 MG PO TABS
650.0000 mg | ORAL_TABLET | Freq: Once | ORAL | Status: AC
  Administered 2020-12-19: 650 mg via ORAL
  Filled 2020-12-19: qty 2

## 2020-12-19 MED ORDER — DIPHENHYDRAMINE HCL 25 MG PO CAPS
25.0000 mg | ORAL_CAPSULE | Freq: Once | ORAL | Status: AC
  Administered 2020-12-19: 25 mg via ORAL
  Filled 2020-12-19: qty 1

## 2020-12-19 MED ORDER — SODIUM CHLORIDE 0.9 % IV SOLN
650.0000 mg | Freq: Every day | INTRAVENOUS | Status: DC
  Administered 2020-12-19 – 2020-12-28 (×10): 650 mg via INTRAVENOUS
  Filled 2020-12-19 (×11): qty 13

## 2020-12-19 NOTE — Plan of Care (Signed)

## 2020-12-19 NOTE — Progress Notes (Signed)
Patient ID: Anne Bautista, female   DOB: 1986/06/10, 35 y.o.   MRN: [id number]   LOS: 33 days   Subjective: Shoulder sore but doing better.    Objective: Vital signs in last 24 hours: Temp:  [97.8 F (36.6 C)-100.4 F (38 C)] 98.3 F (36.8 C) (03/18 0850) Pulse Rate:  [85-100] 85 (03/18 0850) Resp:  [17-20] 18 (03/18 0850) BP: (90-122)/(58-80) 122/72 (03/18 0850) SpO2:  [96 %-98 %] 98 % (03/18 0850) Last BM Date: 12/17/20   Laboratory  CBC Recent Labs    12/17/20 1102 12/18/20 0656  WBC 11.0* 10.1  HGB 7.2* 7.4*  HCT 23.0* 23.6*  PLT 311 306   BMET Recent Labs    12/17/20 1102 12/18/20 0656  NA 135 135  K 3.7 3.7  CL 103 101  CO2 25 25  GLUCOSE 101* 101*  BUN 11 13  CREATININE 1.23* 1.28*  CALCIUM 8.4* 8.5*     Physical Exam General appearance: alert and no distress  Right shoulder: Incision C/D/I, ROM improving with extension ~90 and abduction >45   Assessment/Plan: S/p right septic shoulder s/p I&D -- Will remove sutures.    Freeman Caldron, PA-C Orthopedic Surgery [phone] 12/19/2020

## 2020-12-19 NOTE — Plan of Care (Signed)
  Problem: Fluid Volume: Goal: Hemodynamic stability will improve Outcome: Progressing   Problem: Clinical Measurements: Goal: Signs and symptoms of infection will decrease Outcome: Progressing   Problem: Respiratory: Goal: Ability to maintain adequate ventilation will improve Outcome: Progressing   Problem: Education: Goal: Knowledge of General Education information will improve Description: Including pain rating scale, medication(s)/side effects and non-pharmacologic comfort measures Outcome: Progressing   Problem: Health Behavior/Discharge Planning: Goal: Ability to manage health-related needs will improve Outcome: Progressing   Problem: Clinical Measurements: Goal: Ability to maintain clinical measurements within normal limits will improve Outcome: Progressing Goal: Diagnostic test results will improve Outcome: Progressing Goal: Respiratory complications will improve Outcome: Progressing   Problem: Activity: Goal: Risk for activity intolerance will decrease Outcome: Progressing   Problem: Coping: Goal: Level of anxiety will decrease Outcome: Progressing   Problem: Elimination: Goal: Will not experience complications related to urinary retention Outcome: Progressing   Problem: Pain Managment: Goal: General experience of comfort will improve Outcome: Progressing   Problem: Safety: Goal: Ability to remain free from injury will improve Outcome: Progressing   Problem: Skin Integrity: Goal: Risk for impaired skin integrity will decrease Outcome: Progressing   Problem: Education: Goal: Knowledge of secondary prevention will improve Outcome: Progressing Goal: Knowledge of patient specific risk factors addressed and post discharge goals established will improve Outcome: Progressing

## 2020-12-19 NOTE — Progress Notes (Signed)
Lab called critical value Hgb 6.8, MD notified, awaiting new orders, will continue to monitor

## 2020-12-19 NOTE — Progress Notes (Signed)
Occupational Therapy Treatment Patient Details Name: Anne Bautista MRN: [id number] DOB: 03-Oct-1986 Today's Date: 12/19/2020    History of present illness 35 yo admitted 2/14 with sepsis due to MRSA bacteremia in setting of IVDU. Rt shoulder pain s/p aspiration 2/14. Underwent I&D R shoulder 3/5. Pt had multiple subdeltoid abscesses. PMHx: IVDU, malnutrition, poor dentition.   OT comments  Pt progressing slowly towards established OT goals. Performing PROM of shoulder for forward flexion, abduction, and external rotation; x5 in supine. Providing demonstration and handout for UE exercises with level two theraband. Pt performing 5 reps of each with Max encouragement. Significant other arriving midway through session. Continue to recommend dc to home once medically stable and will continue to follow acutely as admitted.    Follow Up Recommendations  No OT follow up    Equipment Recommendations  None recommended by OT    Recommendations for Other Services      Precautions / Restrictions Precautions Precautions: Fall Precaution Comments: right shoulder, B ankles       Mobility Bed Mobility Overal bed mobility: Independent                  Transfers                      Balance Overall balance assessment: Needs assistance Sitting-balance support: No upper extremity supported;Feet supported Sitting balance-Leahy Scale: Normal                                     ADL either performed or assessed with clinical judgement   ADL                                         General ADL Comments: Focused session on HEP     Vision       Perception     Praxis      Cognition Arousal/Alertness: Awake/alert Behavior During Therapy: WFL for tasks assessed/performed Overall Cognitive Status: Impaired/Different from baseline Area of Impairment: Safety/judgement;Awareness;Problem solving                         Safety/Judgement:  Decreased awareness of safety;Decreased awareness of deficits Awareness: Emergent Problem Solving: Slow processing;Requires verbal cues General Comments: Pt continues to present with poor problem solving and awareness as well as self limiting behavior. requiring increased encouragement        Exercises Exercises: General Upper Extremity;Other exercises General Exercises - Upper Extremity Shoulder Flexion: PROM;Right;5 reps;Supine Shoulder ABduction: PROM;Right;5 reps;Supine Shoulder Horizontal ABduction: Strengthening;Both;5 reps;Seated;Theraband Theraband Level (Shoulder Horizontal Abduction): Level 2 (Red) Shoulder Horizontal ADduction: Strengthening;Both;5 reps;Seated;Theraband Theraband Level (Shoulder Horizontal Adduction): Level 2 (Red) Elbow Flexion: Strengthening;Right;5 reps;Seated;Theraband Theraband Level (Elbow Flexion): Level 2 (Red) Elbow Extension: AROM;Right;5 reps;Seated;Theraband Theraband Level (Elbow Extension): Level 2 (Red) Other Exercises Other Exercises: Demonstrated shoulder forward flexion stretch "spider crawls" up/down wall; pt verbalized understanding Other Exercises: forward flexion of LUE at diagnol with RUE for anchoring   Shoulder Instructions       General Comments      Pertinent Vitals/ Pain       Pain Assessment: Faces Faces Pain Scale: Hurts little more Pain Location: R shoulder Pain Descriptors / Indicators: Aching;Grimacing Pain Intervention(s): Monitored during session;Repositioned  Home Living  Prior Functioning/Environment              Frequency  Min 1X/week        Progress Toward Goals  OT Goals(current goals can now be found in the care plan section)  Progress towards OT goals: Progressing toward goals  Acute Rehab OT Goals Patient Stated Goal: to go home OT Goal Formulation: With patient Time For Goal Achievement: 12/30/20 Potential to Achieve Goals:  Good ADL Goals Pt Will Perform Eating: with set-up;with adaptive utensils;sitting Pt Will Perform Grooming: with supervision;sitting Pt Will Perform Upper Body Dressing: with supervision;sitting Pt Will Perform Lower Body Dressing: sit to/from stand;with min guard assist Pt Will Transfer to Toilet: with min guard assist;ambulating Pt/caregiver will Perform Home Exercise Program: Increased strength;Both right and left upper extremity Additional ADL Goal #1: Pt will perform ADLs at independent level  Plan Frequency needs to be updated;Discharge plan needs to be updated    Co-evaluation                 AM-PAC OT "6 Clicks" Daily Activity     Outcome Measure   Help from another person eating meals?: None Help from another person taking care of personal grooming?: None Help from another person toileting, which includes using toliet, bedpan, or urinal?: None Help from another person bathing (including washing, rinsing, drying)?: A Little Help from another person to put on and taking off regular upper body clothing?: A Little Help from another person to put on and taking off regular lower body clothing?: A Little 6 Click Score: 21    End of Session    OT Visit Diagnosis: Unsteadiness on feet (R26.81);Muscle weakness (generalized) (M62.81);Other symptoms and signs involving cognitive function;Pain Pain - Right/Left: Right Pain - part of body: Shoulder   Activity Tolerance Other (comment) (Self limiting behavior)   Patient Left in bed;with call bell/phone within reach   Nurse Communication Mobility status        Time: 1342-1400 OT Time Calculation (min): 18 min  Charges: OT General Charges $OT Visit: 1 Visit OT Treatments $Therapeutic Exercise: 8-22 mins  Chistian Kasler MSOT, OTR/L Acute Rehab Pager: [phone] Office: [phone]   Theodoro Grist Tamaria Dunleavy 12/19/2020, 2:13 PM

## 2020-12-19 NOTE — Progress Notes (Signed)
PROGRESS NOTE    Anne Bautista  FKC:[id number] DOB: 1985-10-16 DOA: 11/15/2020 PCP: Patient, No Pcp Per    Chief Complaint  Patient presents with  . Generalized Body Aches    Brief Narrative:  Anne Bautista was admitted to the hospital with a working diagnosis of tricuspid valve endocarditis with MRSA bacteremia complicated with severe sepsis, septic emboli to the lungs and kidneys.  Right shoulder septic arthritis, right subdeltoid abscess. Patient with prolonged hospitalization, plan to complete IV antibiotics inpatient until April 30.  35 year old female past medical history for intravenous drug abuse, who presented with cough and myalgias.  Positive right shoulder pain, associated with dyspnea and fever.  Ongoing symptoms for about 7 days.  Recent IV drug abuse about 3 days prior to hospitalization.  She was initially evaluated at [facility] then transferred to [facility] for further management.   Her vitals at the time of transfer include a blood pressure of 131/79, heart rate 124, respiratory rate 46, temperature 98.8, oxygen saturation 96%.  Heart S1-S2, present, tachycardic, lungs with scattered rhonchi bilaterally, increased work of breathing, abdomen soft non tender, positive track marks bilateral upper extremities and left lower extremity.  No edema.  She was diagnosed with sepsis and was admitted to the intensive care unit.  Her blood cultures were positive for MRSA, further work-up revealed tricuspid valve endocarditis.  She was found to have septic emboli to her lungs and kidneys.  During her hospitalization she was found to have right shoulder septic arthritis, underwent I&D 3/5.  Note patient has a PICC line which was placed 2/28, plan to continue antibiotic therapy with daptomycin till 01/19/2021.  Pending final decision about angio VAC debridement per CT surgery.   Right shoulder drain has been removed on 3.10. to remove sutures on until 03/17.     Assessment & Plan:   Principal  Problem:   Endocarditis of tricuspid valve Active Problems:   Severe sepsis due to methicillin resistant Staphylococcus aureus (MRSA) with acute organ dysfunction (HCC)   Poor dentition   Malnutrition (HCC)   Thrombocytopenia (HCC)   Tobacco dependence   Acute cystitis without hematuria   Malnutrition of moderate degree   Abdominal pain   Left foot pain   Septic embolism (HCC)   Staphylococcal arthritis of right shoulder (HCC)   MRSA bacteremia   Acute on chronic respiratory failure with hypoxia (HCC)   Sepsis (HCC)  1 tricuspid valve MRSA endocarditis, complicated with bacteremia (disseminated MRSA infection) and severe sepsis.  Right shoulder septic arthritis and right subdeltoid abscess, POA/positive emboli septic to lungs and kidneys -Secondary to IV drug use  -Improving slowly.  -Patient has been reassessed by CT surgery, and noted to have vegetations on the tricuspid valve and not ideal for angio VAC debridement at this time.  Patient with severe TR. -Per CT surgery patient will be reassessed for valve replacement once she has completed rehab. -ID following and recommending continuation of IV daptomycin. -ID spoke with patient and patient noted to have told ID she did not think she could stay in the hospital for 6 weeks for IV antibiotics postop. -ID recommended antibiotics for 6 weeks from the date of negative blood cultures due to concerns of endocarditis will be treated suboptimally if less. -Per IDs calculation patient should be done with 6 weeks of therapy on January 01, 2021. -Per ID on March 31 if patient does not want to stay any further she could be changed to oral antibiotics/oral therapy to complete at least 8  weeks of postop antibiotics for her shoulder. -Appreciate ID input and recommendations.  2.  Acute hypoxemic respiratory failure Secondary to bilateral septic emboli. -Clinical improvement.   -Patient with sats of 97 to 99% on room air.   -Continue IV daptomycin   -Incentive spirometry.   3.  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   -Continue clonidine twice daily, lorazepam, thiamine.   4.  AKI with ATN/hypokalemia/hypomagnesemia -Renal function improved.   -Good urine output . -Potassium at 3.7.   -Follow.   5.  Acute postop blood loss anemia/iron deficiency anemia Right shoulder drain has been removed. -Hemoglobin trickled down and currently at 6.8.   -Patient with no overt bleeding.   -Transfused 2 units packed red blood cells.  -May need IV iron however will discuss with ID in light of patient's endocarditis/septic emboli. -Transfusion threshold hemoglobin < 7.  6.  Moderate protein calorie malnutrition -Continue nutritional supplementation.   7.  Hypertension Stable on current regimen of clonidine, Norvasc.   8.  Depression/anxiety Seroquel.   9.  Migraine headache Excedrin Migraine as needed.  10.  Debility/deconditioning PT/OT.   DVT prophylaxis: Lovenox Code Status: Full Family Communication: Updated patient.  No family at bedside Disposition:   Status is: Inpatient    Dispo: The patient is from: Home              Anticipated d/c is to: SNF versus home with home health              Patient currently on IV daptomycin, will need to stay hospitalized until completion of IV antibiotics.  Not safe to be discharged home on IV antibiotics due to history of IV drug abuse.  Not stable for discharge.   Difficult to place patient yes       Consultants:   ID: Dr. Drue Second 11/16/2020  PCCM: Dr. Chestine Spore 11/17/2020  Orthopedics: Dr. Susa Simmonds 11/17/2020,/12/06/2020  CT surgery: Dr. Cliffton Asters 11/19/2020  Nephrology: Dr. Melanee Spry 11/21/2020    Procedures:   2D echo 11/17/2020, 12/15/2020  Lower extremity Dopplers 11/17/2020  Left lower extremity soft tissue ultrasound 11/27/2020  Chest x-ray 12/10/2020, 11/25/2020, 11/24/2020, 11/23/2020, 11/20/2020, 11/17/2020, 11/15/2020  Abdominal films 12/01/2020  CT  head 11/15/2020  CT angiogram chest 11/15/2020  CT abdomen and pelvis 11/15/2020  Irrigation and debridement right shoulder abscess 12/06/2020  PICC line 12/01/2020  Antimicrobials:  Anti-infectives (From admission, onward)   Start     Dose/Rate Route Frequency Ordered Stop   12/19/20 2000  DAPTOmycin (CUBICIN) 650 mg in sodium chloride 0.9 % IVPB        650 mg 226 mL/hr over 30 Minutes Intravenous Daily 12/19/20 0900 01/19/21 2359   12/12/20 2000  DAPTOmycin (CUBICIN) 750 mg in sodium chloride 0.9 % IVPB  Status:  Discontinued        750 mg 230 mL/hr over 30 Minutes Intravenous Daily 12/12/20 0719 12/19/20 0900   12/09/20 1200  anidulafungin (ERAXIS) 100 mg in sodium chloride 0.9 % 100 mL IVPB  Status:  Discontinued        100 mg 78 mL/hr over 100 Minutes Intravenous Every 24 hours 12/08/20 1109 12/09/20 1013   12/08/20 1200  anidulafungin (ERAXIS) 200 mg in sodium chloride 0.9 % 200 mL IVPB        200 mg 78 mL/hr over 200 Minutes Intravenous  Once 12/08/20 1109 12/08/20 1724   12/06/20 0833  vancomycin (VANCOCIN) powder  Status:  Discontinued  As needed 12/06/20 0834 12/06/20 0852   12/06/20 0600  ceFAZolin (ANCEF) IVPB 2g/100 mL premix        2 g 200 mL/hr over 30 Minutes Intravenous On call to O.R. 12/06/20 0306 12/06/20 0755   11/28/20 2000  DAPTOmycin (CUBICIN) 764 mg in sodium chloride 0.9 % IVPB  Status:  Discontinued        10 mg/kg  76.4 kg 230.6 mL/hr over 30 Minutes Intravenous Daily 11/28/20 0727 12/12/20 0719   11/24/20 2000  DAPTOmycin (CUBICIN) 827 mg in sodium chloride 0.9 % IVPB  Status:  Discontinued        10 mg/kg  82.7 kg 233.1 mL/hr over 30 Minutes Intravenous Daily 11/24/20 0821 11/28/20 0727   11/19/20 1000  DAPTOmycin (CUBICIN) 770 mg in sodium chloride 0.9 % IVPB  Status:  Discontinued        10 mg/kg  77 kg 230.8 mL/hr over 30 Minutes Intravenous Daily 11/19/20 0908 11/24/20 0821   11/19/20 1000  doxycycline (VIBRA-TABS) tablet 100 mg         100 mg Oral Every 12 hours 11/19/20 0908 11/23/20 2123   11/19/20 0439  vancomycin variable dose per unstable renal function (pharmacist dosing)  Status:  Discontinued         Does not apply See admin instructions 11/19/20 0439 11/19/20 0853   11/16/20 2000  vancomycin (VANCOREADY) IVPB 1250 mg/250 mL  Status:  Discontinued        1,250 mg 166.7 mL/hr over 90 Minutes Intravenous Every 24 hours 11/15/20 1933 11/16/20 1108   11/16/20 1200  vancomycin (VANCOCIN) IVPB 1000 mg/200 mL premix  Status:  Discontinued        1,000 mg 200 mL/hr over 60 Minutes Intravenous Every 12 hours 11/16/20 1108 11/19/20 0439   11/16/20 0745  metroNIDAZOLE (FLAGYL) IVPB 500 mg        500 mg 100 mL/hr over 60 Minutes Intravenous  Once 11/16/20 0741 11/16/20 0907   11/15/20 2237  vancomycin (VANCOCIN) 500 MG powder       Note to Pharmacy: Ihor Dow   : cabinet override      11/15/20 2237 11/16/20 1044   11/15/20 2000  ceFEPIme (MAXIPIME) 2 g in sodium chloride 0.9 % 100 mL IVPB  Status:  Discontinued        2 g 200 mL/hr over 30 Minutes Intravenous Every 12 hours 11/15/20 1933 11/16/20 1212   11/15/20 1945  vancomycin (VANCOREADY) IVPB 1500 mg/300 mL  Status:  Discontinued        1,500 mg 150 mL/hr over 120 Minutes Intravenous  Once 11/15/20 1933 11/15/20 1939   11/15/20 1945  vancomycin (VANCOCIN) IVPB 1000 mg/200 mL premix       "And" Linked Group Details   1,000 mg 200 mL/hr over 60 Minutes Intravenous  Once 11/15/20 1939 11/15/20 2231   11/15/20 1945  vancomycin (VANCOREADY) IVPB 500 mg/100 mL       "And" Linked Group Details   500 mg 100 mL/hr over 60 Minutes Intravenous  Once 11/15/20 1939 11/16/20 0002       Subjective: Alert.  Receiving transfusion of packed red blood cells.  Denies any chest pain.  No shortness of breath.  States right upper extremity/right shoulder pain improving. States orthopedics PA came and saw her and orders placed for stitches to be removed.  Objective: Vitals:    12/19/20 1302 12/19/20 1328 12/19/20 1554 12/19/20 1726  BP: (!) 105/55 100/60 114/75 118/65  Pulse: 89 90 92  99  Resp: 19 18 17 18   Temp:  98.8 F (37.1 C) 98.2 F (36.8 C) 99.2 F (37.3 C)  TempSrc: Oral Oral Oral Oral  SpO2: 97% 97% 99% 99%  Weight:      Height:        Intake/Output Summary (Last 24 hours) at 12/19/2020 1847 Last data filed at 12/19/2020 1302 Gross per 24 hour  Intake 555 ml  Output --  Net 555 ml   Filed Weights   12/01/20 0328 12/02/20 0300 12/16/20 2100  Weight: 74 kg 75.2 kg 66.6 kg    Examination:  General exam: NAD Respiratory system: Lungs clear to auscultation bilaterally.  No wheezes, no crackles, no rhonchi.  Normal respiratory effort.  Cardiovascular system: Regular rate rhythm with 3/6 systolic ejection murmur left sternal border.  No lower extremity edema.  Gastrointestinal system: Abdomen is soft, nontender, nondistended, positive bowel sounds.  No rebound.  No guarding.   Central nervous system: Alert and oriented. No focal neurological deficits. Extremities: Symmetric 5 x 5 power. Skin: No rashes, lesions or ulcers Psychiatry: Judgement and insight appear normal. Mood & affect appropriate.     Data Reviewed: I have personally reviewed following labs and imaging studies  CBC: Recent Labs  Lab 12/14/20 0420 12/17/20 1102 12/18/20 0656 12/19/20 0821  WBC 11.1* 11.0* 10.1 9.2  NEUTROABS 6.5 6.6 5.8 5.1  HGB 7.3* 7.2* 7.4* 6.8*  HCT 22.6* 23.0* 23.6* 21.5*  MCV 84.3 84.6 84.6 84.0  PLT 331 311 306 306    Basic Metabolic Panel: Recent Labs  Lab 12/14/20 0420 12/17/20 1102 12/18/20 0656 12/19/20 0821  NA 137 135 135 134*  K 3.2* 3.7 3.7 3.7  CL 105 103 101 102  CO2 25 25 25 24   GLUCOSE 102* 101* 101* 94  BUN 9 11 13 15   CREATININE 1.21* 1.23* 1.28* 1.32*  CALCIUM 8.2* 8.4* 8.5* 8.4*  MG 1.7 1.9 2.1  --   PHOS  --   --  5.1*  --     GFR: Estimated Creatinine Clearance: 57.3 mL/min (A) (by C-G formula based on SCr of  1.32 mg/dL (H)).  Liver Function Tests: Recent Labs  Lab 12/18/20 0656  ALBUMIN 1.9*    CBG: No results for input(s): GLUCAP in the last 168 hours.   Recent Results (from the past 240 hour(s))  Culture, blood (routine x 2)     Status: None   Collection Time: 12/10/20 10:00 PM   Specimen: BLOOD RIGHT HAND  Result Value Ref Range Status   Specimen Description BLOOD RIGHT HAND  Final   Special Requests   Final    BOTTLES DRAWN AEROBIC AND ANAEROBIC Blood Culture adequate volume   Culture   Final    NO GROWTH 5 DAYS Performed at [facility], [address] [place], [facility] [address]    Report Status 12/15/2020 FINAL  Final  Culture, blood (routine x 2)     Status: None   Collection Time: 12/10/20 10:15 PM   Specimen: BLOOD LEFT HAND  Result Value Ref Range Status   Specimen Description BLOOD LEFT HAND  Final   Special Requests   Final    BOTTLES DRAWN AEROBIC AND ANAEROBIC Blood Culture adequate volume   Culture   Final    NO GROWTH 5 DAYS Performed at [facility], [address], [place], [facility] [address]    Report Status 12/15/2020 FINAL  Final  Culture, Urine     Status: None  Collection Time: 12/11/20 10:38 AM   Specimen: Urine, Clean Catch  Result Value Ref Range Status   Specimen Description URINE, CLEAN CATCH  Final   Special Requests DAPTOMYCIN  Final   Culture   Final    NO GROWTH Performed at [facility], [address] [place], [place] [address]    Report Status 12/12/2020 FINAL  Final         Radiology Studies: No results found.      Scheduled Meds: . sodium chloride   Intravenous Once  . amLODipine  10 mg Oral Daily  . chlorhexidine  15 mL Mouth Rinse BID  . Chlorhexidine Gluconate Cloth  6 each Topical Daily  . cloNIDine  0.1 mg Oral BID  . dicyclomine  20 mg Oral TID AC & HS  . docusate sodium  100 mg Oral BID  . enoxaparin (LOVENOX) injection  40 mg Subcutaneous Q24H  . feeding supplement  1 Container Oral BID  BM  . feeding supplement  237 mL Oral BID BM  . folic acid  1 mg Oral Daily  . mouth rinse  15 mL Mouth Rinse q12n4p  . methadone  10 mg Oral Q8H  . multivitamin with minerals  1 tablet Oral Daily  . pantoprazole  40 mg Oral Daily  . polyethylene glycol  17 g Oral BID  . QUEtiapine  25 mg Oral BID  . senna  2 tablet Oral Daily  . sodium chloride flush  10-40 mL Intracatheter Q12H  . thiamine  100 mg Oral Daily   Continuous Infusions: . sodium chloride Stopped (12/17/20 2352)  . DAPTOmycin (CUBICIN)  IV    . lactated ringers 10 mL/hr at 12/17/20 2351     LOS: 33 days    Time spent: 35 minutes    Ramiro Harvestaniel Latronda Spink, MD [facility]   To contact the attending provider between 7A-7P or the covering provider during after hours 7P-7A, please log into the web site [email or web address] and access using universal [place] password for that web site. If you do not have the password, please call the hospital operator.  12/19/2020, 6:47 PM

## 2020-12-19 NOTE — Progress Notes (Signed)
Physical Therapy Treatment Patient Details Name: Anne Bautista MRN: [id number] DOB: [birth year] Today's Date: 12/19/2020    History of Present Illness 35 yo admitted 2/14 with sepsis due to MRSA bacteremia in setting of IVDU. Rt shoulder pain s/p aspiration 2/14. Underwent I&D R shoulder 3/5. Pt had multiple subdeltoid abscesses. PMHx: IVDU, malnutrition, poor dentition.    PT Comments    Patient requires max encouragement to participate with therapy due to significant other being present and lying in bed with patient. Patient refused ambulation this session due to receiving blood transfusion. Patient performed standing exercises focusing on strengthening and balance. Patient requests HEP to perform in the room, provided patient with HEP and will progress as needed. Updated recommendation to OPPT.    Follow Up Recommendations  Outpatient PT     Equipment Recommendations  None recommended by PT    Recommendations for Other Services       Precautions / Restrictions Precautions Precautions: Fall Precaution Comments: right shoulder, B ankles Restrictions Weight Bearing Restrictions: No    Mobility  Bed Mobility Overal bed mobility: Independent                  Transfers Overall transfer level: Modified independent                  Ambulation/Gait             General Gait Details: refused ambulation due to receiving blood transfusion   Stairs             Wheelchair Mobility    Modified Rankin (Stroke Patients Only)       Balance Overall balance assessment: Needs assistance Sitting-balance support: No upper extremity supported;Feet supported Sitting balance-Leahy Scale: Normal     Standing balance support: No upper extremity supported;During functional activity Standing balance-Leahy Scale: Good Standing balance comment: standing reaching activities with no LOB and able to bring self back to midline                             Cognition Arousal/Alertness: Awake/alert Behavior During Therapy: WFL for tasks assessed/performed Overall Cognitive Status: Impaired/Different from baseline Area of Impairment: Safety/judgement;Awareness;Problem solving                         Safety/Judgement: Decreased awareness of safety;Decreased awareness of deficits Awareness: Emergent Problem Solving: Slow processing;Requires verbal cues General Comments: Self limiting behavior noted requiring increased encouragement to participate      Exercises General Exercises - Upper Extremity Shoulder Flexion: PROM;Right;5 reps;Supine Shoulder ABduction: PROM;Right;5 reps;Supine Shoulder Horizontal ABduction: Strengthening;Both;5 reps;Seated;Theraband Theraband Level (Shoulder Horizontal Abduction): Level 2 (Red) Shoulder Horizontal ADduction: Strengthening;Both;5 reps;Seated;Theraband Theraband Level (Shoulder Horizontal Adduction): Level 2 (Red) Elbow Flexion: Strengthening;Right;5 reps;Seated;Theraband Theraband Level (Elbow Flexion): Level 2 (Red) Elbow Extension: AROM;Right;5 reps;Seated;Theraband Theraband Level (Elbow Extension): Level 2 (Red) General Exercises - Lower Extremity Long Arc Quad: Both;10 reps;Seated Hip Flexion/Marching: Both;10 reps;Standing Other Exercises Other Exercises: standing reaching activities Other Exercises: seated reaching encouraging use of R arm    General Comments General comments (skin integrity, edema, etc.): Provided HEP per patient request which includes SLR, heel slides, bridges, sidelying abduction, prone extension, standing marching, mini squats, standing hip abduction, seated marching, seated LAQs      Pertinent Vitals/Pain Pain Assessment: Faces Faces Pain Scale: Hurts a little bit Pain Location: R shoulder Pain Descriptors / Indicators: Aching;Grimacing Pain Intervention(s): Monitored during session    Home  Living                      Prior Function             PT Goals (current goals can now be found in the care plan section) Acute Rehab PT Goals Patient Stated Goal: to go home PT Goal Formulation: With patient Time For Goal Achievement: 12/30/20 Potential to Achieve Goals: Good Progress towards PT goals: Progressing toward goals    Frequency    Min 3X/week      PT Plan Current plan remains appropriate    Co-evaluation              AM-PAC PT "6 Clicks" Mobility   Outcome Measure  Help needed turning from your back to your side while in a flat bed without using bedrails?: None Help needed moving from lying on your back to sitting on the side of a flat bed without using bedrails?: None Help needed moving to and from a bed to a chair (including a wheelchair)?: None Help needed standing up from a chair using your arms (e.g., wheelchair or bedside chair)?: None Help needed to walk in hospital room?: A Little Help needed climbing 3-5 steps with a railing? : A Little 6 Click Score: 22    End of Session   Activity Tolerance: Patient tolerated treatment well Patient left: in bed;with call bell/phone within reach;with family/visitor present Nurse Communication: Mobility status PT Visit Diagnosis: Other abnormalities of gait and mobility (R26.89) Pain - Right/Left: Right Pain - part of body: Shoulder;Ankle and joints of foot     Time: 4540-9811 PT Time Calculation (min) (ACUTE ONLY): 28 min  Charges:  $Therapeutic Exercise: 23-37 mins                     Netasha Wehrli A. Dan Humphreys PT, DPT Acute Rehabilitation Services Pager [phone] Office [phone]    Viviann Spare 12/19/2020, 5:12 PM

## 2020-12-19 NOTE — Progress Notes (Signed)
PRBC transfusion started at 1313  Pre transfusion vitals Oral temp: 99.1 BP 105/55 HR 89 RR 19 SpO2 97% room air

## 2020-12-20 LAB — BPAM RBC
Blood Product Expiration Date: 202204132359
Blood Product Expiration Date: 202204132359
ISSUE DATE / TIME: 202203181253
ISSUE DATE / TIME: 202203182249
Unit Type and Rh: 7300
Unit Type and Rh: 7300

## 2020-12-20 LAB — TYPE AND SCREEN
ABO/RH(D): B POS
Antibody Screen: NEGATIVE
Unit division: 0
Unit division: 0

## 2020-12-20 LAB — BASIC METABOLIC PANEL
Anion gap: 10 (ref 5–15)
BUN: 14 mg/dL (ref 6–20)
CO2: 24 mmol/L (ref 22–32)
Calcium: 8.4 mg/dL — ABNORMAL LOW (ref 8.9–10.3)
Chloride: 99 mmol/L (ref 98–111)
Creatinine, Ser: 1.33 mg/dL — ABNORMAL HIGH (ref 0.44–1.00)
GFR, Estimated: 53 mL/min — ABNORMAL LOW (ref >60)
Glucose, Bld: 97 mg/dL (ref 70–99)
Potassium: 3.5 mmol/L (ref 3.5–5.1)
Sodium: 133 mmol/L — ABNORMAL LOW (ref 135–145)

## 2020-12-20 LAB — CBC
HCT: 25.7 % — ABNORMAL LOW (ref 36.0–46.0)
Hemoglobin: 8.6 g/dL — ABNORMAL LOW (ref 12.0–15.0)
MCH: 27.4 pg (ref 26.0–34.0)
MCHC: 33.5 g/dL (ref 30.0–36.0)
MCV: 81.8 fL (ref 80.0–100.0)
Platelets: 300 10*3/uL (ref 150–400)
RBC: 3.14 MIL/uL — ABNORMAL LOW (ref 3.87–5.11)
RDW: 15.9 % — ABNORMAL HIGH (ref 11.5–15.5)
WBC: 10.4 10*3/uL (ref 4.0–10.5)
nRBC: 0 % (ref 0.0–0.2)

## 2020-12-20 LAB — MAGNESIUM: Magnesium: 1.9 mg/dL (ref 1.7–2.4)

## 2020-12-20 MED ORDER — POTASSIUM CHLORIDE CRYS ER 20 MEQ PO TBCR
40.0000 meq | EXTENDED_RELEASE_TABLET | Freq: Once | ORAL | Status: AC
  Administered 2020-12-20: 40 meq via ORAL
  Filled 2020-12-20: qty 2

## 2020-12-20 NOTE — Progress Notes (Signed)
PROGRESS NOTE    Anne Bautista  ERD:[id number] DOB: 20-Mar-1986 DOA: 11/15/2020 PCP: Patient, No Pcp Per    Chief Complaint  Patient presents with  . Generalized Body Aches    Brief Narrative:  Anne Bautista was admitted to the hospital with a working diagnosis of tricuspid valve endocarditis with MRSA bacteremia complicated with severe sepsis, septic emboli to the lungs and kidneys.  Right shoulder septic arthritis, right subdeltoid abscess. Patient with prolonged hospitalization, plan to complete IV antibiotics inpatient until April 75.  35 year old female past medical history for intravenous drug abuse, who presented with cough and myalgias.  Positive right shoulder pain, associated with dyspnea and fever.  Ongoing symptoms for about 7 days.  Recent IV drug abuse about 3 days prior to hospitalization.  She was initially evaluated at [facility] then transferred to [facility] for further management.   Her vitals at the time of transfer include a blood pressure of 131/79, heart rate 124, respiratory rate 46, temperature 98.8, oxygen saturation 96%.  Heart S1-S2, present, tachycardic, lungs with scattered rhonchi bilaterally, increased work of breathing, abdomen soft non tender, positive track marks bilateral upper extremities and left lower extremity.  No edema.  She was diagnosed with sepsis and was admitted to the intensive care unit.  Her blood cultures were positive for MRSA, further work-up revealed tricuspid valve endocarditis.  She was found to have septic emboli to her lungs and kidneys.  During her hospitalization she was found to have right shoulder septic arthritis, underwent I&D 3/5.  Note patient has a PICC line which was placed 2/28, plan to continue antibiotic therapy with daptomycin till 01/19/2021.  Pending final decision about angio VAC debridement per CT surgery.   Right shoulder drain has been removed on 3.10. to remove sutures on until 03/17.     Assessment & Plan:   Principal  Problem:   Endocarditis of tricuspid valve Active Problems:   Severe sepsis due to methicillin resistant Staphylococcus aureus (MRSA) with acute organ dysfunction (HCC)   Poor dentition   Malnutrition (HCC)   Thrombocytopenia (HCC)   Tobacco dependence   Acute cystitis without hematuria   Malnutrition of moderate degree   Abdominal pain   Left foot pain   Septic embolism (HCC)   Staphylococcal arthritis of right shoulder (HCC)   MRSA bacteremia   Acute on chronic respiratory failure with hypoxia (HCC)   Sepsis (HCC)  1 tricuspid valve MRSA endocarditis, complicated with bacteremia (disseminated MRSA infection) and severe sepsis.  Right shoulder septic arthritis and right subdeltoid abscess, POA/positive emboli septic to lungs and kidneys -Secondary to IV drug use  -Improving slowly.  -Patient has been reassessed by CT surgery, and noted to have vegetations on the tricuspid valve and not ideal for angio VAC debridement at this time.  Patient with severe TR. -Per CT surgery patient will be reassessed for valve replacement once she has completed rehab. -ID following and recommending continuation of IV daptomycin. -ID spoke with patient and patient noted to have told ID she did not think she could stay in the hospital for 6 weeks for IV antibiotics postop. -ID recommended antibiotics for 6 weeks from the date of negative blood cultures due to concerns of endocarditis will be treated suboptimally if less. -Per IDs calculation patient should be done with 6 weeks of therapy on January 01, 2021. -Per ID on March 31 if patient does not want to stay any further she could be changed to oral antibiotics/oral therapy to complete at least 8  weeks of postop antibiotics for her shoulder. -Appreciate ID input and recommendations.  2.  Acute hypoxemic respiratory failure Secondary to bilateral septic emboli. -.  Improved clinically.   -Sats of 97% on room air.   -Continue IV daptomycin.   -Incentive  spirometry.    3.  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   -Continue clonidine twice daily, lorazepam, thiamine.   4.  AKI with ATN/hypokalemia/hypomagnesemia -Renal function improved.   -Good urine output . -Potassium at 3.5 -K. Dur 40 mEq p.o. x1..   -Follow.   5.  Acute postop blood loss anemia/iron deficiency anemia Right shoulder drain has been removed. -Hemoglobin trickled down to 6.8.   -No overt bleeding.   -Status post transfusion 2 units packed red blood cells 12/19/2020 with posttransfusion hemoglobin stable at 8.6.  -May need IV iron however will need to discuss with ID as patient with an acute infection with endocarditis/septic emboli. -Transfusion threshold hemoglobin < 7.  6.  Moderate protein calorie malnutrition -Continue nutritional supplementation.   7.  Hypertension Clonidine, Norvasc.   8.  Depression/anxiety Stable continue Seroquel.   9.  Migraine headache Excedrin Migraine as needed.  10.  Debility/deconditioning PT/OT.   DVT prophylaxis: Ambulating/SCDs Code Status: Full Family Communication: Updated patient.  No family at bedside Disposition:   Status is: Inpatient    Dispo: The patient is from: Home              Anticipated d/c is to: SNF versus home with home health              Patient currently on IV daptomycin, will need to stay hospitalized until completion of IV antibiotics.  Not safe to be discharged home on IV antibiotics due to history of IV drug abuse.  Not stable for discharge.   Difficult to place patient yes       Consultants:   ID: Dr. Drue Second 11/16/2020  PCCM: Dr. Chestine Spore 11/17/2020  Orthopedics: Dr. Susa Simmonds 11/17/2020,/12/06/2020  CT surgery: Dr. Cliffton Asters 11/19/2020  Nephrology: Dr. Melanee Spry 11/21/2020    Procedures:   2D echo 11/17/2020, 12/15/2020  Lower extremity Dopplers 11/17/2020  Left lower extremity soft tissue ultrasound 11/27/2020  Chest x-ray 12/10/2020, 11/25/2020, 11/24/2020,  11/23/2020, 11/20/2020, 11/17/2020, 11/15/2020  Abdominal films 12/01/2020  CT head 11/15/2020  CT angiogram chest 11/15/2020  CT abdomen and pelvis 11/15/2020  Irrigation and debridement right shoulder abscess 12/06/2020  PICC line 12/01/2020  Transfusion 2 units packed red blood cells 12/19/2020  Antimicrobials:  Anti-infectives (From admission, onward)   Start     Dose/Rate Route Frequency Ordered Stop   12/19/20 2000  DAPTOmycin (CUBICIN) 650 mg in sodium chloride 0.9 % IVPB        650 mg 226 mL/hr over 30 Minutes Intravenous Daily 12/19/20 0900 01/19/21 2359   12/12/20 2000  DAPTOmycin (CUBICIN) 750 mg in sodium chloride 0.9 % IVPB  Status:  Discontinued        750 mg 230 mL/hr over 30 Minutes Intravenous Daily 12/12/20 0719 12/19/20 0900   12/09/20 1200  anidulafungin (ERAXIS) 100 mg in sodium chloride 0.9 % 100 mL IVPB  Status:  Discontinued        100 mg 78 mL/hr over 100 Minutes Intravenous Every 24 hours 12/08/20 1109 12/09/20 1013   12/08/20 1200  anidulafungin (ERAXIS) 200 mg in sodium chloride 0.9 % 200 mL IVPB        200 mg 78 mL/hr over 200 Minutes Intravenous  Once 12/08/20 1109 12/08/20  1724   12/06/20 0833  vancomycin (VANCOCIN) powder  Status:  Discontinued          As needed 12/06/20 0834 12/06/20 0852   12/06/20 0600  ceFAZolin (ANCEF) IVPB 2g/100 mL premix        2 g 200 mL/hr over 30 Minutes Intravenous On call to O.R. 12/06/20 0306 12/06/20 0755   11/28/20 2000  DAPTOmycin (CUBICIN) 764 mg in sodium chloride 0.9 % IVPB  Status:  Discontinued        10 mg/kg  76.4 kg 230.6 mL/hr over 30 Minutes Intravenous Daily 11/28/20 0727 12/12/20 0719   11/24/20 2000  DAPTOmycin (CUBICIN) 827 mg in sodium chloride 0.9 % IVPB  Status:  Discontinued        10 mg/kg  82.7 kg 233.1 mL/hr over 30 Minutes Intravenous Daily 11/24/20 0821 11/28/20 0727   11/19/20 1000  DAPTOmycin (CUBICIN) 770 mg in sodium chloride 0.9 % IVPB  Status:  Discontinued        10 mg/kg  77 kg 230.8  mL/hr over 30 Minutes Intravenous Daily 11/19/20 0908 11/24/20 0821   11/19/20 1000  doxycycline (VIBRA-TABS) tablet 100 mg        100 mg Oral Every 12 hours 11/19/20 0908 11/23/20 2123   11/19/20 0439  vancomycin variable dose per unstable renal function (pharmacist dosing)  Status:  Discontinued         Does not apply See admin instructions 11/19/20 0439 11/19/20 0853   11/16/20 2000  vancomycin (VANCOREADY) IVPB 1250 mg/250 mL  Status:  Discontinued        1,250 mg 166.7 mL/hr over 90 Minutes Intravenous Every 24 hours 11/15/20 1933 11/16/20 1108   11/16/20 1200  vancomycin (VANCOCIN) IVPB 1000 mg/200 mL premix  Status:  Discontinued        1,000 mg 200 mL/hr over 60 Minutes Intravenous Every 12 hours 11/16/20 1108 11/19/20 0439   11/16/20 0745  metroNIDAZOLE (FLAGYL) IVPB 500 mg        500 mg 100 mL/hr over 60 Minutes Intravenous  Once 11/16/20 0741 11/16/20 0907   11/15/20 2237  vancomycin (VANCOCIN) 500 MG powder       Note to Pharmacy: [name], [name]   : cabinet override      11/15/20 2237 11/16/20 1044   11/15/20 2000  ceFEPIme (MAXIPIME) 2 g in sodium chloride 0.9 % 100 mL IVPB  Status:  Discontinued        2 g 200 mL/hr over 30 Minutes Intravenous Every 12 hours 11/15/20 1933 11/16/20 1212   11/15/20 1945  vancomycin (VANCOREADY) IVPB 1500 mg/300 mL  Status:  Discontinued        1,500 mg 150 mL/hr over 120 Minutes Intravenous  Once 11/15/20 1933 11/15/20 1939   11/15/20 1945  vancomycin (VANCOCIN) IVPB 1000 mg/200 mL premix       "And" Linked Group Details   1,000 mg 200 mL/hr over 60 Minutes Intravenous  Once 11/15/20 1939 11/15/20 2231   11/15/20 1945  vancomycin (VANCOREADY) IVPB 500 mg/100 mL       "And" Linked Group Details   500 mg 100 mL/hr over 60 Minutes Intravenous  Once 11/15/20 1939 11/16/20 0002       Subjective: Laying in bed.  Feeling better after transfusion.  Denies any chest pain.  No shortness of breath.  Tolerating oral intake.  Patient noted to be  refusing Lovenox shots and states she has been ambulating and would prefer not to get Lovenox shots.  Objective: Vitals:   12/20/20 0220 12/20/20 0434 12/20/20 0903 12/20/20 1236  BP: 114/65 127/67 112/64 108/60  Pulse: 98 93 96 91  Resp: 18 19 16 18   Temp: 99.6 F (37.6 C) 99.4 F (37.4 C) 98.1 F (36.7 C) 98.4 F (36.9 C)  TempSrc: Oral Oral Oral Oral  SpO2: 98% 96% 97% 96%  Weight:      Height:        Intake/Output Summary (Last 24 hours) at 12/20/2020 1548 Last data filed at 12/20/2020 0220 Gross per 24 hour  Intake 854 ml  Output --  Net 854 ml   Filed Weights   12/01/20 0328 12/02/20 0300 12/16/20 2100  Weight: 74 kg 75.2 kg 66.6 kg    Examination:  General exam: NAD Respiratory system: CTA B.  No wheezes, no crackles, no rhonchi.  Normal respiratory effort.  Cardiovascular system: RRR with 3/6 systolic ejection murmur left sternal border.  No lower extremity edema  Gastrointestinal system: Abdomen is soft, nontender, nondistended, positive bowel sounds.  No rebound.  No guarding.  Central nervous system: Alert and oriented.  No focal neurological deficits.  Extremities: Symmetric 5 x 5 power. Skin: No rashes, lesions or ulcers Psychiatry: Judgement and insight appear normal. Mood & affect appropriate.     Data Reviewed: I have personally reviewed following labs and imaging studies  CBC: Recent Labs  Lab 12/14/20 0420 12/17/20 1102 12/18/20 0656 12/19/20 0821 12/20/20 0223  WBC 11.1* 11.0* 10.1 9.2 10.4  NEUTROABS 6.5 6.6 5.8 5.1  --   HGB 7.3* 7.2* 7.4* 6.8* 8.6*  HCT 22.6* 23.0* 23.6* 21.5* 25.7*  MCV 84.3 84.6 84.6 84.0 81.8  PLT 331 311 306 306 300    Basic Metabolic Panel: Recent Labs  Lab 12/14/20 0420 12/17/20 1102 12/18/20 0656 12/19/20 0821 12/20/20 0223  NA 137 135 135 134* 133*  K 3.2* 3.7 3.7 3.7 3.5  CL 105 103 101 102 99  CO2 25 25 25 24 24   GLUCOSE 102* 101* 101* 94 97  BUN 9 11 13 15 14   CREATININE 1.21* 1.23* 1.28*  1.32* 1.33*  CALCIUM 8.2* 8.4* 8.5* 8.4* 8.4*  MG 1.7 1.9 2.1  --  1.9  PHOS  --   --  5.1*  --   --     GFR: Estimated Creatinine Clearance: 56.9 mL/min (A) (by C-G formula based on SCr of 1.33 mg/dL (H)).  Liver Function Tests: Recent Labs  Lab 12/18/20 0656  ALBUMIN 1.9*    CBG: No results for input(s): GLUCAP in the last 168 hours.   Recent Results (from the past 240 hour(s))  Culture, blood (routine x 2)     Status: None   Collection Time: 12/10/20 10:00 PM   Specimen: BLOOD RIGHT HAND  Result Value Ref Range Status   Specimen Description BLOOD RIGHT HAND  Final   Special Requests   Final    BOTTLES DRAWN AEROBIC AND ANAEROBIC Blood Culture adequate volume   Culture   Final    NO GROWTH 5 DAYS Performed at [facility], [address] [place], [facility] [address]    Report Status 12/15/2020 FINAL  Final  Culture, blood (routine x 2)     Status: None   Collection Time: 12/10/20 10:15 PM   Specimen: BLOOD LEFT HAND  Result Value Ref Range Status   Specimen Description BLOOD LEFT HAND  Final   Special Requests   Final    BOTTLES DRAWN AEROBIC AND ANAEROBIC Blood Culture adequate  volume   Culture   Final    NO GROWTH 5 DAYS Performed at [facility], [address] [place], [place] [address]    Report Status 12/15/2020 FINAL  Final  Culture, Urine     Status: None   Collection Time: 12/11/20 10:38 AM   Specimen: Urine, Clean Catch  Result Value Ref Range Status   Specimen Description URINE, CLEAN CATCH  Final   Special Requests DAPTOMYCIN  Final   Culture   Final    NO GROWTH Performed at [facility], [address], [place], [place] [address]    Report Status 12/12/2020 FINAL  Final         Radiology Studies: No results found.      Scheduled Meds: . sodium chloride   Intravenous Once  . amLODipine  10 mg Oral Daily  . chlorhexidine  15 mL Mouth Rinse BID  . Chlorhexidine Gluconate Cloth  6 each Topical Daily  . cloNIDine   0.1 mg Oral BID  . dicyclomine  20 mg Oral TID AC & HS  . docusate sodium  100 mg Oral BID  . enoxaparin (LOVENOX) injection  40 mg Subcutaneous Q24H  . feeding supplement  1 Container Oral BID BM  . feeding supplement  237 mL Oral BID BM  . folic acid  1 mg Oral Daily  . mouth rinse  15 mL Mouth Rinse q12n4p  . methadone  10 mg Oral Q8H  . multivitamin with minerals  1 tablet Oral Daily  . pantoprazole  40 mg Oral Daily  . polyethylene glycol  17 g Oral BID  . QUEtiapine  25 mg Oral BID  . senna  2 tablet Oral Daily  . sodium chloride flush  10-40 mL Intracatheter Q12H  . thiamine  100 mg Oral Daily   Continuous Infusions: . sodium chloride Stopped (12/17/20 2352)  . DAPTOmycin (CUBICIN)  IV 650 mg (12/19/20 2107)  . lactated ringers 10 mL/hr at 12/17/20 2351     LOS: 34 days    Time spent: 35 minutes    Ramiro Harvest, MD [facility]   To contact the attending provider between 7A-7P or the covering provider during after hours 7P-7A, please log into the web site [email or web address] and access using universal [place] password for that web site. If you do not have the password, please call the hospital operator.  12/20/2020, 3:48 PM

## 2020-12-21 LAB — BASIC METABOLIC PANEL
Anion gap: 6 (ref 5–15)
BUN: 14 mg/dL (ref 6–20)
CO2: 24 mmol/L (ref 22–32)
Calcium: 8.8 mg/dL — ABNORMAL LOW (ref 8.9–10.3)
Chloride: 100 mmol/L (ref 98–111)
Creatinine, Ser: 1.31 mg/dL — ABNORMAL HIGH (ref 0.44–1.00)
GFR, Estimated: 54 mL/min — ABNORMAL LOW (ref >60)
Glucose, Bld: 96 mg/dL (ref 70–99)
Potassium: 3.9 mmol/L (ref 3.5–5.1)
Sodium: 130 mmol/L — ABNORMAL LOW (ref 135–145)

## 2020-12-21 NOTE — Progress Notes (Signed)
PROGRESS NOTE    Anne Bautista  ZOX:[id number] DOB: 1986/04/16 DOA: 11/15/2020 PCP: Patient, No Pcp Per    Chief Complaint  Patient presents with  . Generalized Body Aches    Brief Narrative:  Anne Bautista was admitted to the hospital with a working diagnosis of tricuspid valve endocarditis with MRSA bacteremia complicated with severe sepsis, septic emboli to the lungs and kidneys.  Right shoulder septic arthritis, right subdeltoid abscess. Patient with prolonged hospitalization, plan to complete IV antibiotics inpatient until April 20.  35 year old female past medical history for intravenous drug abuse, who presented with cough and myalgias.  Positive right shoulder pain, associated with dyspnea and fever.  Ongoing symptoms for about 7 days.  Recent IV drug abuse about 3 days prior to hospitalization.  She was initially evaluated at [facility] then transferred to [facility] for further management.   Her vitals at the time of transfer include a blood pressure of 131/79, heart rate 124, respiratory rate 46, temperature 98.8, oxygen saturation 96%.  Heart S1-S2, present, tachycardic, lungs with scattered rhonchi bilaterally, increased work of breathing, abdomen soft non tender, positive track marks bilateral upper extremities and left lower extremity.  No edema.  She was diagnosed with sepsis and was admitted to the intensive care unit.  Her blood cultures were positive for MRSA, further work-up revealed tricuspid valve endocarditis.  She was found to have septic emboli to her lungs and kidneys.  During her hospitalization she was found to have right shoulder septic arthritis, underwent I&D 3/5.  Note patient has a PICC line which was placed 2/28, plan to continue antibiotic therapy with daptomycin till 01/19/2021.  Pending final decision about angio VAC debridement per CT surgery.   Right shoulder drain has been removed on 3.10. to remove sutures on until 03/17.     Assessment & Plan:   Principal  Problem:   Endocarditis of tricuspid valve Active Problems:   Severe sepsis due to methicillin resistant Staphylococcus aureus (MRSA) with acute organ dysfunction (HCC)   Poor dentition   Malnutrition (HCC)   Thrombocytopenia (HCC)   Tobacco dependence   Acute cystitis without hematuria   Malnutrition of moderate degree   Abdominal pain   Left foot pain   Septic embolism (HCC)   Staphylococcal arthritis of right shoulder (HCC)   MRSA bacteremia   Acute on chronic respiratory failure with hypoxia (HCC)   Sepsis (HCC)  1 tricuspid valve MRSA endocarditis, complicated with bacteremia (disseminated MRSA infection) and severe sepsis.  Right shoulder septic arthritis and right subdeltoid abscess, POA/positive emboli septic to lungs and kidneys -Secondary to IV drug use  -Improving slowly.  -Patient has been reassessed by CT surgery, and noted to have vegetations on the tricuspid valve and not ideal for angio VAC debridement at this time.  Patient with severe TR. -Per CT surgery patient will be reassessed for valve replacement once she has completed rehab. -ID following and recommending continuation of IV daptomycin. -ID spoke with patient and patient noted to have told ID she did not think she could stay in the hospital for 6 weeks for IV antibiotics postop. -ID recommended antibiotics for 6 weeks from the date of negative blood cultures due to concerns of endocarditis will be treated suboptimally if less. -Per IDs calculation patient should be done with 6 weeks of therapy on January 01, 2021. -Per ID on March 31 if patient does not want to stay any further she could be changed to oral antibiotics/oral therapy to complete at least 8  weeks of postop antibiotics for her shoulder. -Appreciate ID input and recommendations.  2.  Acute hypoxemic respiratory failure Secondary to bilateral septic emboli. -.  Clinical improvement.  -Sats of 98% on room air.  -Continue IV daptomycin.  -Incentive  spirometry.  3.  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  May need to consider slowly weaning methadone down weekly and patient may need to be set up with methadone clinic on discharge. -Continue clonidine twice daily, lorazepam, thiamine.   4.  AKI with ATN/hypokalemia/hypomagnesemia -Renal function improved.   -Good urine output.   -Potassium at 3.9.    5.  Acute postop blood loss anemia/iron deficiency anemia Right shoulder drain has been removed. -Hemoglobin trickled down to 6.8.   -No overt bleeding.   -Status post transfusion 2 units packed red blood cells 12/19/2020 with posttransfusion hemoglobin stable at 8.6.  -May need IV iron however will need to discuss with ID as patient with an acute infection with endocarditis/septic emboli. -Transfusion threshold hemoglobin < 7.  6.  Moderate protein calorie malnutrition -Continue nutritional supplementation.   7.  Hypertension Stable on current regimen of Norvasc and clonidine.   8.  Depression/anxiety Seroquel.   9.  Migraine headache Excedrin Migraine as needed.  10.  Debility/deconditioning PT/OT.    DVT prophylaxis: Ambulating/SCDs Code Status: Full Family Communication: Updated patient.  No family at bedside Disposition:   Status is: Inpatient    Dispo: The patient is from: Home              Anticipated d/c is to: SNF versus home with home health              Patient currently on IV daptomycin, will need to stay hospitalized until completion of IV antibiotics.  Not safe to be discharged home on IV antibiotics due to history of IV drug abuse.  Not stable for discharge.   Difficult to place patient yes       Consultants:   ID: Dr. Drue Second 11/16/2020  PCCM: Dr. Chestine Spore 11/17/2020  Orthopedics: Dr. Susa Simmonds 11/17/2020,/12/06/2020  CT surgery: Dr. Cliffton Asters 11/19/2020  Nephrology: Dr. Melanee Spry 11/21/2020    Procedures:   2D echo 11/17/2020, 12/15/2020  Lower extremity Dopplers  11/17/2020  Left lower extremity soft tissue ultrasound 11/27/2020  Chest x-ray 12/10/2020, 11/25/2020, 11/24/2020, 11/23/2020, 11/20/2020, 11/17/2020, 11/15/2020  Abdominal films 12/01/2020  CT head 11/15/2020  CT angiogram chest 11/15/2020  CT abdomen and pelvis 11/15/2020  Irrigation and debridement right shoulder abscess 12/06/2020  PICC line 12/01/2020  Transfusion 2 units packed red blood cells 12/19/2020  Antimicrobials:  Anti-infectives (From admission, onward)   Start     Dose/Rate Route Frequency Ordered Stop   12/19/20 2000  DAPTOmycin (CUBICIN) 650 mg in sodium chloride 0.9 % IVPB        650 mg 226 mL/hr over 30 Minutes Intravenous Daily 12/19/20 0900 01/19/21 2359   12/12/20 2000  DAPTOmycin (CUBICIN) 750 mg in sodium chloride 0.9 % IVPB  Status:  Discontinued        750 mg 230 mL/hr over 30 Minutes Intravenous Daily 12/12/20 0719 12/19/20 0900   12/09/20 1200  anidulafungin (ERAXIS) 100 mg in sodium chloride 0.9 % 100 mL IVPB  Status:  Discontinued        100 mg 78 mL/hr over 100 Minutes Intravenous Every 24 hours 12/08/20 1109 12/09/20 1013   12/08/20 1200  anidulafungin (ERAXIS) 200 mg in sodium chloride 0.9 % 200 mL IVPB  200 mg 78 mL/hr over 200 Minutes Intravenous  Once 12/08/20 1109 12/08/20 1724   12/06/20 0833  vancomycin (VANCOCIN) powder  Status:  Discontinued          As needed 12/06/20 0834 12/06/20 0852   12/06/20 0600  ceFAZolin (ANCEF) IVPB 2g/100 mL premix        2 g 200 mL/hr over 30 Minutes Intravenous On call to O.R. 12/06/20 0306 12/06/20 0755   11/28/20 2000  DAPTOmycin (CUBICIN) 764 mg in sodium chloride 0.9 % IVPB  Status:  Discontinued        10 mg/kg  76.4 kg 230.6 mL/hr over 30 Minutes Intravenous Daily 11/28/20 0727 12/12/20 0719   11/24/20 2000  DAPTOmycin (CUBICIN) 827 mg in sodium chloride 0.9 % IVPB  Status:  Discontinued        10 mg/kg  82.7 kg 233.1 mL/hr over 30 Minutes Intravenous Daily 11/24/20 0821 11/28/20 0727   11/19/20 1000   DAPTOmycin (CUBICIN) 770 mg in sodium chloride 0.9 % IVPB  Status:  Discontinued        10 mg/kg  77 kg 230.8 mL/hr over 30 Minutes Intravenous Daily 11/19/20 0908 11/24/20 0821   11/19/20 1000  doxycycline (VIBRA-TABS) tablet 100 mg        100 mg Oral Every 12 hours 11/19/20 0908 11/23/20 2123   11/19/20 0439  vancomycin variable dose per unstable renal function (pharmacist dosing)  Status:  Discontinued         Does not apply See admin instructions 11/19/20 0439 11/19/20 0853   11/16/20 2000  vancomycin (VANCOREADY) IVPB 1250 mg/250 mL  Status:  Discontinued        1,250 mg 166.7 mL/hr over 90 Minutes Intravenous Every 24 hours 11/15/20 1933 11/16/20 1108   11/16/20 1200  vancomycin (VANCOCIN) IVPB 1000 mg/200 mL premix  Status:  Discontinued        1,000 mg 200 mL/hr over 60 Minutes Intravenous Every 12 hours 11/16/20 1108 11/19/20 0439   11/16/20 0745  metroNIDAZOLE (FLAGYL) IVPB 500 mg        500 mg 100 mL/hr over 60 Minutes Intravenous  Once 11/16/20 0741 11/16/20 0907   11/15/20 2237  vancomycin (VANCOCIN) 500 MG powder       Note to Pharmacy: Ihor Dow   : cabinet override      11/15/20 2237 11/16/20 1044   11/15/20 2000  ceFEPIme (MAXIPIME) 2 g in sodium chloride 0.9 % 100 mL IVPB  Status:  Discontinued        2 g 200 mL/hr over 30 Minutes Intravenous Every 12 hours 11/15/20 1933 11/16/20 1212   11/15/20 1945  vancomycin (VANCOREADY) IVPB 1500 mg/300 mL  Status:  Discontinued        1,500 mg 150 mL/hr over 120 Minutes Intravenous  Once 11/15/20 1933 11/15/20 1939   11/15/20 1945  vancomycin (VANCOCIN) IVPB 1000 mg/200 mL premix       "And" Linked Group Details   1,000 mg 200 mL/hr over 60 Minutes Intravenous  Once 11/15/20 1939 11/15/20 2231   11/15/20 1945  vancomycin (VANCOREADY) IVPB 500 mg/100 mL       "And" Linked Group Details   500 mg 100 mL/hr over 60 Minutes Intravenous  Once 11/15/20 1939 11/16/20 0002       Subjective: Sleeping but arousable.  Denies  any chest pain.  No shortness of breath.  Tolerating oral intake.  States has been ambulating in the room.   Objective: Vitals:   12/20/20  2250 12/21/20 0022 12/21/20 0400 12/21/20 0854  BP: 127/70 (!) 103/55 127/68 123/68  Pulse: 99 94 82 91  Resp:  14 14 16   Temp:  99.8 F (37.7 C) 97.8 F (36.6 C) 99.2 F (37.3 C)  TempSrc:  Oral Oral Oral  SpO2:  96% 97% 98%  Weight:      Height:        Intake/Output Summary (Last 24 hours) at 12/21/2020 0929 Last data filed at 12/20/2020 2254 Gross per 24 hour  Intake 373 ml  Output --  Net 373 ml   Filed Weights   12/01/20 0328 12/02/20 0300 12/16/20 2100  Weight: 74 kg 75.2 kg 66.6 kg    Examination:  General exam: : NAD Respiratory system: CTA B anterior lung fields.  No wheezes, no rhonchi.  Speaking in full sentences.  Normal respiratory effort. Cardiovascular system: Regular rate and rhythm with 3/6 SEM left sternal border.  No JVD.  No lower extremity edema. Gastrointestinal system: Abdomen soft, nontender, nondistended, positive bowel sounds.  No rebound.  No guarding. Central nervous system: Alert and oriented. No focal neurological deficits. Extremities: Symmetric 5 x 5 power. Skin: No rashes, lesions or ulcers Psychiatry: Judgement and insight appear normal. Mood & affect appropriate.    Data Reviewed: I have personally reviewed following labs and imaging studies  CBC: Recent Labs  Lab 12/17/20 1102 12/18/20 0656 12/19/20 0821 12/20/20 0223  WBC 11.0* 10.1 9.2 10.4  NEUTROABS 6.6 5.8 5.1  --   HGB 7.2* 7.4* 6.8* 8.6*  HCT 23.0* 23.6* 21.5* 25.7*  MCV 84.6 84.6 84.0 81.8  PLT 311 306 306 300    Basic Metabolic Panel: Recent Labs  Lab 12/17/20 1102 12/18/20 0656 12/19/20 0821 12/20/20 0223 12/21/20 0500  NA 135 135 134* 133* 130*  K 3.7 3.7 3.7 3.5 3.9  CL 103 101 102 99 100  CO2 25 25 24 24 24   GLUCOSE 101* 101* 94 97 96  BUN 11 13 15 14 14   CREATININE 1.23* 1.28* 1.32* 1.33* 1.31*  CALCIUM 8.4*  8.5* 8.4* 8.4* 8.8*  MG 1.9 2.1  --  1.9  --   PHOS  --  5.1*  --   --   --     GFR: Estimated Creatinine Clearance: 57.7 mL/min (A) (by C-G formula based on SCr of 1.31 mg/dL (H)).  Liver Function Tests: Recent Labs  Lab 12/18/20 0656  ALBUMIN 1.9*    CBG: No results for input(s): GLUCAP in the last 168 hours.   Recent Results (from the past 240 hour(s))  Culture, Urine     Status: None   Collection Time: 12/11/20 10:38 AM   Specimen: Urine, Clean Catch  Result Value Ref Range Status   Specimen Description URINE, CLEAN CATCH  Final   Special Requests DAPTOMYCIN  Final   Culture   Final    NO GROWTH Performed at [facility], [address] [place], [facility] [address]    Report Status 12/12/2020 FINAL  Final         Radiology Studies: No results found.      Scheduled Meds: . sodium chloride   Intravenous Once  . amLODipine  10 mg Oral Daily  . chlorhexidine  15 mL Mouth Rinse BID  . Chlorhexidine Gluconate Cloth  6 each Topical Daily  . cloNIDine  0.1 mg Oral BID  . dicyclomine  20 mg Oral TID AC & HS  . docusate sodium  100 mg Oral BID  .  feeding supplement  1 Container Oral BID BM  . feeding supplement  237 mL Oral BID BM  . folic acid  1 mg Oral Daily  . mouth rinse  15 mL Mouth Rinse q12n4p  . methadone  10 mg Oral Q8H  . multivitamin with minerals  1 tablet Oral Daily  . pantoprazole  40 mg Oral Daily  . polyethylene glycol  17 g Oral BID  . QUEtiapine  25 mg Oral BID  . senna  2 tablet Oral Daily  . sodium chloride flush  10-40 mL Intracatheter Q12H  . thiamine  100 mg Oral Daily   Continuous Infusions: . sodium chloride Stopped (12/20/20 2303)  . DAPTOmycin (CUBICIN)  IV Stopped (12/20/20 2254)  . lactated ringers 10 mL/hr at 12/17/20 2351     LOS: 35 days    Time spent: 35 minutes    Ramiro Harvestaniel Viann Nielson, MD [facility]   To contact the attending provider between 7A-7P or the covering provider during after hours 7P-7A,  please log into the web site [email or web address] and access using universal [place] password for that web site. If you do not have the password, please call the hospital operator.  12/21/2020, 9:29 AM

## 2020-12-22 ENCOUNTER — Other Ambulatory Visit: Payer: Self-pay | Admitting: Nurse Practitioner

## 2020-12-22 LAB — CK: Total CK: 11 U/L — ABNORMAL LOW (ref 38–234)

## 2020-12-22 MED ORDER — LINEZOLID 600 MG PO TABS: 600.0000 mg | ORAL_TABLET | Freq: Two times a day (BID) | ORAL | 0 refills | Status: AC

## 2020-12-22 MED ORDER — METHADONE HCL 10 MG PO TABS: 10.0000 mg | ORAL_TABLET | Freq: Three times a day (TID) | ORAL | 0 refills | Status: AC | PRN

## 2020-12-22 MED ORDER — SODIUM CHLORIDE 0.9 % IV SOLN
510.0000 mg | Freq: Once | INTRAVENOUS | Status: AC
  Administered 2020-12-22: 510 mg via INTRAVENOUS
  Filled 2020-12-22: qty 17

## 2020-12-22 MED ORDER — ACETAMINOPHEN 325 MG PO TABS: 650.0000 mg | ORAL_TABLET | ORAL | Status: AC | PRN

## 2020-12-22 NOTE — Progress Notes (Signed)
PROGRESS NOTE    Anne Bautista  ZOX:[id number] DOB: 1986/04/16 DOA: 11/15/2020 PCP: Patient, No Pcp Per    Chief Complaint  Patient presents with  . Generalized Body Aches    Brief Narrative:  [name] was admitted to the hospital with a working diagnosis of tricuspid valve endocarditis with MRSA bacteremia complicated with severe sepsis, septic emboli to the lungs and kidneys.  Right shoulder septic arthritis, right subdeltoid abscess. Patient with prolonged hospitalization, plan to complete IV antibiotics inpatient until April 20.  35 year old female past medical history for intravenous drug abuse, who presented with cough and myalgias.  Positive right shoulder pain, associated with dyspnea and fever.  Ongoing symptoms for about 7 days.  Recent IV drug abuse about 3 days prior to hospitalization.  She was initially evaluated at [facility] then transferred to [facility] for further management.   Her vitals at the time of transfer include a blood pressure of 131/79, heart rate 124, respiratory rate 46, temperature 98.8, oxygen saturation 96%.  Heart S1-S2, present, tachycardic, lungs with scattered rhonchi bilaterally, increased work of breathing, abdomen soft non tender, positive track marks bilateral upper extremities and left lower extremity.  No edema.  She was diagnosed with sepsis and was admitted to the intensive care unit.  Her blood cultures were positive for MRSA, further work-up revealed tricuspid valve endocarditis.  She was found to have septic emboli to her lungs and kidneys.  During her hospitalization she was found to have right shoulder septic arthritis, underwent I&D 3/5.  Note patient has a PICC line which was placed 2/28, plan to continue antibiotic therapy with daptomycin till 01/19/2021.  Pending final decision about angio VAC debridement per CT surgery.   Right shoulder drain has been removed on 3.10. to remove sutures on until 03/17.     Assessment & Plan:   Principal  Problem:   Endocarditis of tricuspid valve Active Problems:   Severe sepsis due to methicillin resistant Staphylococcus aureus (MRSA) with acute organ dysfunction (HCC)   Poor dentition   Malnutrition (HCC)   Thrombocytopenia (HCC)   Tobacco dependence   Acute cystitis without hematuria   Malnutrition of moderate degree   Abdominal pain   Left foot pain   Septic embolism (HCC)   Staphylococcal arthritis of right shoulder (HCC)   MRSA bacteremia   Acute on chronic respiratory failure with hypoxia (HCC)   Sepsis (HCC)  1 tricuspid valve MRSA endocarditis, complicated with bacteremia (disseminated MRSA infection) and severe sepsis.  Right shoulder septic arthritis and right subdeltoid abscess, POA/positive emboli septic to lungs and kidneys -Secondary to IV drug use  -Improving slowly.  -Patient has been reassessed by CT surgery, and noted to have vegetations on the tricuspid valve and not ideal for angio VAC debridement at this time.  Patient with severe TR. -Per CT surgery patient will be reassessed for valve replacement once she has completed rehab. -ID following and recommending continuation of IV daptomycin. -ID spoke with patient and patient noted to have told ID she did not think she could stay in the hospital for 6 weeks for IV antibiotics postop. -ID recommended antibiotics for 6 weeks from the date of negative blood cultures due to concerns of endocarditis will be treated suboptimally if less. -Per IDs calculation patient should be done with 6 weeks of therapy on January 01, 2021. -Per ID on March 31 if patient does not want to stay any further she could be changed to oral antibiotics/oral therapy to complete at least 8  weeks of postop antibiotics for her shoulder. -Appreciate ID input and recommendations.  2.  Acute hypoxemic respiratory failure Secondary to bilateral septic emboli. -.  Clinical improvement.  -Sats 97% on room air.  -Continue IV daptomycin.  -Incentive  spirometry.  3.  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 -We will wean methadone down weekly ( ie methadone twice daily x1 week, then daily x1 week, then every other day, then off), discussed with patient who is in agreement and would like to be weaned off methadone by the time of discharge. -Continue clonidine twice daily, lorazepam, thiamine.   4.  AKI with ATN/hypokalemia/hypomagnesemia -Renal function improved.   -Urine output not properly recorded.   -Electrolytes repleted.  5.  Acute postop blood loss anemia/iron deficiency anemia Right shoulder drain has been removed. -Hemoglobin trickled down to 6.8.   -No overt bleeding.   -Status post transfusion 2 units packed red blood cells 12/19/2020. -Hemoglobin stable at 8.6.    - Will give a dose of IV feraheme x 1. Discussed with ID. -Transfusion threshold hemoglobin < 7.  6.  Moderate protein calorie malnutrition -Continue nutritional supplementation.   7.  Hypertension Continue Norvasc, clonidine.    8.  Depression/anxiety Stable.  Continue Seroquel.   9.  Migraine headache Excedrin Migraine as needed.  10.  Debility/deconditioning PT/OT.    DVT prophylaxis: Ambulating/SCDs Code Status: Full Family Communication: Updated patient.  No family at bedside Disposition:   Status is: Inpatient    Dispo: The patient is from: Home              Anticipated d/c is to: SNF versus home with home health              Patient currently on IV daptomycin, will need to stay hospitalized until completion of IV antibiotics.  Not safe to be discharged home on IV antibiotics due to history of IV drug abuse.  Not stable for discharge.   Difficult to place patient yes       Consultants:   ID: Dr. Drue Second 11/16/2020  PCCM: Dr. Chestine Spore 11/17/2020  Orthopedics: Dr. Susa Simmonds 11/17/2020,/12/06/2020  CT surgery: Dr. Cliffton Asters 11/19/2020  Nephrology: Dr. Melanee Spry 11/21/2020    Procedures:   2D echo 11/17/2020,  12/15/2020  Lower extremity Dopplers 11/17/2020  Left lower extremity soft tissue ultrasound 11/27/2020  Chest x-ray 12/10/2020, 11/25/2020, 11/24/2020, 11/23/2020, 11/20/2020, 11/17/2020, 11/15/2020  Abdominal films 12/01/2020  CT head 11/15/2020  CT angiogram chest 11/15/2020  CT abdomen and pelvis 11/15/2020  Irrigation and debridement right shoulder abscess 12/06/2020  PICC line 12/01/2020  Transfusion 2 units packed red blood cells 12/19/2020  Antimicrobials:  Anti-infectives (From admission, onward)   Start     Dose/Rate Route Frequency Ordered Stop   12/22/20 0000  linezolid (ZYVOX) 600 MG tablet        600 mg Oral 2 times daily 12/22/20 0830     12/19/20 2000  DAPTOmycin (CUBICIN) 650 mg in sodium chloride 0.9 % IVPB        650 mg 226 mL/hr over 30 Minutes Intravenous Daily 12/19/20 0900 01/19/21 2359   12/12/20 2000  DAPTOmycin (CUBICIN) 750 mg in sodium chloride 0.9 % IVPB  Status:  Discontinued        750 mg 230 mL/hr over 30 Minutes Intravenous Daily 12/12/20 0719 12/19/20 0900   12/09/20 1200  anidulafungin (ERAXIS) 100 mg in sodium chloride 0.9 % 100 mL IVPB  Status:  Discontinued        100  mg 78 mL/hr over 100 Minutes Intravenous Every 24 hours 12/08/20 1109 12/09/20 1013   12/08/20 1200  anidulafungin (ERAXIS) 200 mg in sodium chloride 0.9 % 200 mL IVPB        200 mg 78 mL/hr over 200 Minutes Intravenous  Once 12/08/20 1109 12/08/20 1724   12/06/20 0833  vancomycin (VANCOCIN) powder  Status:  Discontinued          As needed 12/06/20 0834 12/06/20 0852   12/06/20 0600  ceFAZolin (ANCEF) IVPB 2g/100 mL premix        2 g 200 mL/hr over 30 Minutes Intravenous On call to O.R. 12/06/20 0306 12/06/20 0755   11/28/20 2000  DAPTOmycin (CUBICIN) 764 mg in sodium chloride 0.9 % IVPB  Status:  Discontinued        10 mg/kg  76.4 kg 230.6 mL/hr over 30 Minutes Intravenous Daily 11/28/20 0727 12/12/20 0719   11/24/20 2000  DAPTOmycin (CUBICIN) 827 mg in sodium chloride 0.9 % IVPB   Status:  Discontinued        10 mg/kg  82.7 kg 233.1 mL/hr over 30 Minutes Intravenous Daily 11/24/20 0821 11/28/20 0727   11/19/20 1000  DAPTOmycin (CUBICIN) 770 mg in sodium chloride 0.9 % IVPB  Status:  Discontinued        10 mg/kg  77 kg 230.8 mL/hr over 30 Minutes Intravenous Daily 11/19/20 0908 11/24/20 0821   11/19/20 1000  doxycycline (VIBRA-TABS) tablet 100 mg        100 mg Oral Every 12 hours 11/19/20 0908 11/23/20 2123   11/19/20 0439  vancomycin variable dose per unstable renal function (pharmacist dosing)  Status:  Discontinued         Does not apply See admin instructions 11/19/20 0439 11/19/20 0853   11/16/20 2000  vancomycin (VANCOREADY) IVPB 1250 mg/250 mL  Status:  Discontinued        1,250 mg 166.7 mL/hr over 90 Minutes Intravenous Every 24 hours 11/15/20 1933 11/16/20 1108   11/16/20 1200  vancomycin (VANCOCIN) IVPB 1000 mg/200 mL premix  Status:  Discontinued        1,000 mg 200 mL/hr over 60 Minutes Intravenous Every 12 hours 11/16/20 1108 11/19/20 0439   11/16/20 0745  metroNIDAZOLE (FLAGYL) IVPB 500 mg        500 mg 100 mL/hr over 60 Minutes Intravenous  Once 11/16/20 0741 11/16/20 0907   11/15/20 2237  vancomycin (VANCOCIN) 500 MG powder       Note to Pharmacy: Ihor Dow   : cabinet override      11/15/20 2237 11/16/20 1044   11/15/20 2000  ceFEPIme (MAXIPIME) 2 g in sodium chloride 0.9 % 100 mL IVPB  Status:  Discontinued        2 g 200 mL/hr over 30 Minutes Intravenous Every 12 hours 11/15/20 1933 11/16/20 1212   11/15/20 1945  vancomycin (VANCOREADY) IVPB 1500 mg/300 mL  Status:  Discontinued        1,500 mg 150 mL/hr over 120 Minutes Intravenous  Once 11/15/20 1933 11/15/20 1939   11/15/20 1945  vancomycin (VANCOCIN) IVPB 1000 mg/200 mL premix       "And" Linked Group Details   1,000 mg 200 mL/hr over 60 Minutes Intravenous  Once 11/15/20 1939 11/15/20 2231   11/15/20 1945  vancomycin (VANCOREADY) IVPB 500 mg/100 mL       "And" Linked Group Details    500 mg 100 mL/hr over 60 Minutes Intravenous  Once 11/15/20 1939 11/16/20 0002  Subjective: Sleeping but arousable.  Denies any chest pain.  No shortness of breath.  Tolerating oral intake.  States has been ambulating in the room.   Objective: Vitals:   12/22/20 0610 12/22/20 0833 12/22/20 1217 12/22/20 1508  BP: 129/80 119/73 114/79 128/76  Pulse: (!) 101 87 91 93  Resp: 15 18 16 18   Temp: 99.1 F (37.3 C) 100.2 F (37.9 C) 98.4 F (36.9 C) 99.7 F (37.6 C)  TempSrc: Oral Oral Oral Oral  SpO2: 100% 96% 97% 94%  Weight:      Height:        Intake/Output Summary (Last 24 hours) at 12/22/2020 1811 Last data filed at 12/21/2020 2030 Gross per 24 hour  Intake 10 ml  Output -  Net 10 ml   Filed Weights   12/01/20 0328 12/02/20 0300 12/16/20 2100  Weight: 74 kg 75.2 kg 66.6 kg    Examination:  General exam: : NAD Respiratory system: CTA B anterior lung fields.  No wheezes, no rhonchi.  Speaking in full sentences.  Normal respiratory effort. Cardiovascular system: Regular rate and rhythm with 3/6 SEM left sternal border.  No JVD.  No lower extremity edema. Gastrointestinal system: Abdomen soft, nontender, nondistended, positive bowel sounds.  No rebound.  No guarding. Central nervous system: Alert and oriented. No focal neurological deficits. Extremities: Symmetric 5 x 5 power. Skin: No rashes, lesions or ulcers Psychiatry: Judgement and insight appear normal. Mood & affect appropriate.    Data Reviewed: I have personally reviewed following labs and imaging studies  CBC: Recent Labs  Lab 12/17/20 1102 12/18/20 0656 12/19/20 0821 12/20/20 0223  WBC 11.0* 10.1 9.2 10.4  NEUTROABS 6.6 5.8 5.1  --   HGB 7.2* 7.4* 6.8* 8.6*  HCT 23.0* 23.6* 21.5* 25.7*  MCV 84.6 84.6 84.0 81.8  PLT 311 306 306 300    Basic Metabolic Panel: Recent Labs  Lab 12/17/20 1102 12/18/20 0656 12/19/20 0821 12/20/20 0223 12/21/20 0500  NA 135 135 134* 133* 130*  K 3.7 3.7  3.7 3.5 3.9  CL 103 101 102 99 100  CO2 25 25 24 24 24   GLUCOSE 101* 101* 94 97 96  BUN 11 13 15 14 14   CREATININE 1.23* 1.28* 1.32* 1.33* 1.31*  CALCIUM 8.4* 8.5* 8.4* 8.4* 8.8*  MG 1.9 2.1  --  1.9  --   PHOS  --  5.1*  --   --   --     GFR: Estimated Creatinine Clearance: 57.7 mL/min (A) (by C-G formula based on SCr of 1.31 mg/dL (H)).  Liver Function Tests: Recent Labs  Lab 12/18/20 0656  ALBUMIN 1.9*    CBG: No results for input(s): GLUCAP in the last 168 hours.   No results found for this or any previous visit (from the past 240 hour(s)).       Radiology Studies: No results found.      Scheduled Meds: . sodium chloride   Intravenous Once  . amLODipine  10 mg Oral Daily  . chlorhexidine  15 mL Mouth Rinse BID  . Chlorhexidine Gluconate Cloth  6 each Topical Daily  . cloNIDine  0.1 mg Oral BID  . dicyclomine  20 mg Oral TID AC & HS  . docusate sodium  100 mg Oral BID  . feeding supplement  1 Container Oral BID BM  . feeding supplement  237 mL Oral BID BM  . folic acid  1 mg Oral Daily  . mouth rinse  15 mL Mouth Rinse  [id number]  . methadone  10 mg Oral Q8H  . multivitamin with minerals  1 tablet Oral Daily  . pantoprazole  40 mg Oral Daily  . polyethylene glycol  17 g Oral BID  . QUEtiapine  25 mg Oral BID  . senna  2 tablet Oral Daily  . sodium chloride flush  10-40 mL Intracatheter Q12H  . thiamine  100 mg Oral Daily   Continuous Infusions: . sodium chloride Stopped (12/21/20 2338)  . DAPTOmycin (CUBICIN)  IV Stopped (12/21/20 2339)  . ferumoxytol    . lactated ringers 10 mL/hr at 12/17/20 2351     LOS: 36 days    Time spent: 35 minutes    Ramiro Harvest, MD [facility]   To contact the attending provider between 7A-7P or the covering provider during after hours 7P-7A, please log into the web site [email or web address] and access using universal [place] password for that web site. If you do not have the password, please call the  hospital operator.  12/22/2020, 6:11 PM

## 2020-12-22 NOTE — TOC Progression Note (Signed)
Transition of Care [facility]) - Progression Note    Patient Details  Name: Anne Bautista MRN: [id number] Date of Birth: 03/15/1986  [facility] CM/SW Contact  Curlene Labrum, RN Phone Number: 12/22/2020, 1:11 PM  Clinical Narrative:    Case management met with the patient at the bedside regarding transitions of care to home.  The patient states that she will not be returning to her mother's home in [place], [place], but will be discharging to her apartment in [place], listed on the [facility].  The patient's mother will be visiting with the patient at her apartment and will be providing transportation to physician appointments and drug rehabilitation.  I called and reschedule the patient's [facility] appointment for a later date - since the patient will be discharging home from the hospital after 01/01/2021 when she is finished with her IV antibiotics and transitioned over to oral treatment for her infection.  See new [facility] appointment in the discharge instructions.  CM called and left a message with [facility] at [phone] and they will follow up with the patient for drug rehabilitation for counseling and drug treatment with Methadone.  I also called and left a message with Thurmond Butts, CM at Dr. Abran Duke office to ask for a follow up appointment after discharge from the hospital.  Patient will need MATCH placed for discharge medication.  The patient planned discharge from the hospital is scheduled for 01/01/2021 once the patient has completed her IV antibiotic therapy at the hospital for infection and vegetation of her tricuspid valve of her heart.   Expected Discharge Plan: Home/Self Care Barriers to Discharge: Continued Medical Work up (Patient to discharge home with mother on/after 01/19/2021)  Expected Discharge Plan and Services Expected Discharge Plan: Home/Self Care In-house Referral: Clinical Social Work Discharge Planning Services: CM [place] appt  scheduled,Medication Assistance   Living arrangements for the past 2 months: Apartment                                       Social Determinants of Health (SDOH) Interventions    Readmission Risk Interventions Readmission Risk Prevention Plan 12/10/2020  Transportation Screening Complete  PCP or Specialist Appt within 3-5 Days Complete  HRI or [place] Complete  Social Work Consult for [place] Planning/Counseling Complete  Palliative Care Screening Complete  Medication Review [employer]) Complete

## 2020-12-22 NOTE — Progress Notes (Signed)
PROGRESS NOTE    Anne Bautista  ZOX:[id number] DOB: 1986/04/16 DOA: 11/15/2020 PCP: Patient, No Pcp Per    Chief Complaint  Patient presents with  . Generalized Body Aches    Brief Narrative:  Anne Bautista was admitted to the hospital with a working diagnosis of tricuspid valve endocarditis with MRSA bacteremia complicated with severe sepsis, septic emboli to the lungs and kidneys.  Right shoulder septic arthritis, right subdeltoid abscess. Patient with prolonged hospitalization, plan to complete IV antibiotics inpatient until April 20.  35 year old female past medical history for intravenous drug abuse, who presented with cough and myalgias.  Positive right shoulder pain, associated with dyspnea and fever.  Ongoing symptoms for about 7 days.  Recent IV drug abuse about 3 days prior to hospitalization.  She was initially evaluated at [facility] then transferred to [facility] for further management.   Her vitals at the time of transfer include a blood pressure of 131/79, heart rate 124, respiratory rate 46, temperature 98.8, oxygen saturation 96%.  Heart S1-S2, present, tachycardic, lungs with scattered rhonchi bilaterally, increased work of breathing, abdomen soft non tender, positive track marks bilateral upper extremities and left lower extremity.  No edema.  She was diagnosed with sepsis and was admitted to the intensive care unit.  Her blood cultures were positive for MRSA, further work-up revealed tricuspid valve endocarditis.  She was found to have septic emboli to her lungs and kidneys.  During her hospitalization she was found to have right shoulder septic arthritis, underwent I&D 3/5.  Note patient has a PICC line which was placed 2/28, plan to continue antibiotic therapy with daptomycin till 01/19/2021.  Pending final decision about angio VAC debridement per CT surgery.   Right shoulder drain has been removed on 3.10. to remove sutures on until 03/17.     Assessment & Plan:   Principal  Problem:   Endocarditis of tricuspid valve Active Problems:   Severe sepsis due to methicillin resistant Staphylococcus aureus (MRSA) with acute organ dysfunction (HCC)   Poor dentition   Malnutrition (HCC)   Thrombocytopenia (HCC)   Tobacco dependence   Acute cystitis without hematuria   Malnutrition of moderate degree   Abdominal pain   Left foot pain   Septic embolism (HCC)   Staphylococcal arthritis of right shoulder (HCC)   MRSA bacteremia   Acute on chronic respiratory failure with hypoxia (HCC)   Sepsis (HCC)  1 tricuspid valve MRSA endocarditis, complicated with bacteremia (disseminated MRSA infection) and severe sepsis.  Right shoulder septic arthritis and right subdeltoid abscess, POA/positive emboli septic to lungs and kidneys -Secondary to IV drug use  -Improving slowly.  -Patient has been reassessed by CT surgery, and noted to have vegetations on the tricuspid valve and not ideal for angio VAC debridement at this time.  Patient with severe TR. -Per CT surgery patient will be reassessed for valve replacement once she has completed rehab. -ID following and recommending continuation of IV daptomycin. -ID spoke with patient and patient noted to have told ID she did not think she could stay in the hospital for 6 weeks for IV antibiotics postop. -ID recommended antibiotics for 6 weeks from the date of negative blood cultures due to concerns of endocarditis will be treated suboptimally if less. -Per IDs calculation patient should be done with 6 weeks of therapy on January 01, 2021. -Per ID on March 31 if patient does not want to stay any further she could be changed to oral antibiotics/oral therapy to complete at least 8  weeks of postop antibiotics for her shoulder. -Appreciate ID input and recommendations.  2.  Acute hypoxemic respiratory failure Secondary to bilateral septic emboli. -.  Clinical improvement.  -Sats 97% on room air.  -Continue IV daptomycin.  -Incentive  spirometry.  3.  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 -We will wean methadone down weekly ( ie methadone twice daily x1 week, then daily x1 week, then every other day, then off), discussed with patient who is in agreement and would like to be weaned off methadone by the time of discharge. -Continue clonidine twice daily, lorazepam, thiamine.   4.  AKI with ATN/hypokalemia/hypomagnesemia -Renal function improved.   -Urine output not properly recorded.   -Electrolytes repleted.  5.  Acute postop blood loss anemia/iron deficiency anemia Right shoulder drain has been removed. -Hemoglobin trickled down to 6.8.   -No overt bleeding.   -Status post transfusion 2 units packed red blood cells 12/19/2020. -Hemoglobin stable at 8.6.    -May need IV iron however will need to discuss with ID as patient with an acute infection with endocarditis/septic emboli. -Transfusion threshold hemoglobin < 7.  6.  Moderate protein calorie malnutrition -Continue nutritional supplementation.   7.  Hypertension Continue Norvasc, clonidine.    8.  Depression/anxiety Stable.  Continue Seroquel.   9.  Migraine headache Excedrin Migraine as needed.  10.  Debility/deconditioning PT/OT.    DVT prophylaxis: Ambulating/SCDs Code Status: Full Family Communication: Updated patient.  No family at bedside Disposition:   Status is: Inpatient    Dispo: The patient is from: Home              Anticipated d/c is to: SNF versus home with home health              Patient currently on IV daptomycin, will need to stay hospitalized until completion of IV antibiotics.  Not safe to be discharged home on IV antibiotics due to history of IV drug abuse.  Not stable for discharge.   Difficult to place patient yes       Consultants:   ID: Dr. Drue Second 11/16/2020  PCCM: Dr. Chestine Spore 11/17/2020  Orthopedics: Dr. Susa Simmonds 11/17/2020,/12/06/2020  CT surgery: Dr. Cliffton Asters 11/19/2020  Nephrology: Dr.  Melanee Spry 11/21/2020    Procedures:   2D echo 11/17/2020, 12/15/2020  Lower extremity Dopplers 11/17/2020  Left lower extremity soft tissue ultrasound 11/27/2020  Chest x-ray 12/10/2020, 11/25/2020, 11/24/2020, 11/23/2020, 11/20/2020, 11/17/2020, 11/15/2020  Abdominal films 12/01/2020  CT head 11/15/2020  CT angiogram chest 11/15/2020  CT abdomen and pelvis 11/15/2020  Irrigation and debridement right shoulder abscess 12/06/2020  PICC line 12/01/2020  Transfusion 2 units packed red blood cells 12/19/2020  Antimicrobials:  Anti-infectives (From admission, onward)   Start     Dose/Rate Route Frequency Ordered Stop   12/22/20 0000  linezolid (ZYVOX) 600 MG tablet        600 mg Oral 2 times daily 12/22/20 0830     12/19/20 2000  DAPTOmycin (CUBICIN) 650 mg in sodium chloride 0.9 % IVPB        650 mg 226 mL/hr over 30 Minutes Intravenous Daily 12/19/20 0900 01/19/21 2359   12/12/20 2000  DAPTOmycin (CUBICIN) 750 mg in sodium chloride 0.9 % IVPB  Status:  Discontinued        750 mg 230 mL/hr over 30 Minutes Intravenous Daily 12/12/20 0719 12/19/20 0900   12/09/20 1200  anidulafungin (ERAXIS) 100 mg in sodium chloride 0.9 % 100 mL IVPB  Status:  Discontinued  100 mg 78 mL/hr over 100 Minutes Intravenous Every 24 hours 12/08/20 1109 12/09/20 1013   12/08/20 1200  anidulafungin (ERAXIS) 200 mg in sodium chloride 0.9 % 200 mL IVPB        200 mg 78 mL/hr over 200 Minutes Intravenous  Once 12/08/20 1109 12/08/20 1724   12/06/20 0833  vancomycin (VANCOCIN) powder  Status:  Discontinued          As needed 12/06/20 0834 12/06/20 0852   12/06/20 0600  ceFAZolin (ANCEF) IVPB 2g/100 mL premix        2 g 200 mL/hr over 30 Minutes Intravenous On call to O.R. 12/06/20 0306 12/06/20 0755   11/28/20 2000  DAPTOmycin (CUBICIN) 764 mg in sodium chloride 0.9 % IVPB  Status:  Discontinued        10 mg/kg  76.4 kg 230.6 mL/hr over 30 Minutes Intravenous Daily 11/28/20 0727 12/12/20 0719   11/24/20 2000   DAPTOmycin (CUBICIN) 827 mg in sodium chloride 0.9 % IVPB  Status:  Discontinued        10 mg/kg  82.7 kg 233.1 mL/hr over 30 Minutes Intravenous Daily 11/24/20 0821 11/28/20 0727   11/19/20 1000  DAPTOmycin (CUBICIN) 770 mg in sodium chloride 0.9 % IVPB  Status:  Discontinued        10 mg/kg  77 kg 230.8 mL/hr over 30 Minutes Intravenous Daily 11/19/20 0908 11/24/20 0821   11/19/20 1000  doxycycline (VIBRA-TABS) tablet 100 mg        100 mg Oral Every 12 hours 11/19/20 0908 11/23/20 2123   11/19/20 0439  vancomycin variable dose per unstable renal function (pharmacist dosing)  Status:  Discontinued         Does not apply See admin instructions 11/19/20 0439 11/19/20 0853   11/16/20 2000  vancomycin (VANCOREADY) IVPB 1250 mg/250 mL  Status:  Discontinued        1,250 mg 166.7 mL/hr over 90 Minutes Intravenous Every 24 hours 11/15/20 1933 11/16/20 1108   11/16/20 1200  vancomycin (VANCOCIN) IVPB 1000 mg/200 mL premix  Status:  Discontinued        1,000 mg 200 mL/hr over 60 Minutes Intravenous Every 12 hours 11/16/20 1108 11/19/20 0439   11/16/20 0745  metroNIDAZOLE (FLAGYL) IVPB 500 mg        500 mg 100 mL/hr over 60 Minutes Intravenous  Once 11/16/20 0741 11/16/20 0907   11/15/20 2237  vancomycin (VANCOCIN) 500 MG powder       Note to Pharmacy: [name], [name]   : cabinet override      11/15/20 2237 11/16/20 1044   11/15/20 2000  ceFEPIme (MAXIPIME) 2 g in sodium chloride 0.9 % 100 mL IVPB  Status:  Discontinued        2 g 200 mL/hr over 30 Minutes Intravenous Every 12 hours 11/15/20 1933 11/16/20 1212   11/15/20 1945  vancomycin (VANCOREADY) IVPB 1500 mg/300 mL  Status:  Discontinued        1,500 mg 150 mL/hr over 120 Minutes Intravenous  Once 11/15/20 1933 11/15/20 1939   11/15/20 1945  vancomycin (VANCOCIN) IVPB 1000 mg/200 mL premix       "And" Linked Group Details   1,000 mg 200 mL/hr over 60 Minutes Intravenous  Once 11/15/20 1939 11/15/20 2231   11/15/20 1945  vancomycin  (VANCOREADY) IVPB 500 mg/100 mL       "And" Linked Group Details   500 mg 100 mL/hr over 60 Minutes Intravenous  Once 11/15/20 1939 11/16/20 0002  Subjective: Sleeping but arousable.  Denies any chest pain.  No shortness of breath.  Tolerating oral intake.  States has been ambulating in the room.   Objective: Vitals:   12/21/20 2034 12/21/20 2342 12/22/20 0610 12/22/20 0833  BP: 112/76 122/70 129/80 119/73  Pulse: 99 86 (!) 101 87  Resp: 16 18 15 18   Temp: 99.2 F (37.3 C) 98.6 F (37 C) 99.1 F (37.3 C) 100.2 F (37.9 C)  TempSrc: Oral Oral Oral Oral  SpO2: 97% 95% 100% 96%  Weight:      Height:        Intake/Output Summary (Last 24 hours) at 12/22/2020 1217 Last data filed at 12/21/2020 2030 Gross per 24 hour  Intake 10 ml  Output 200 ml  Net -190 ml   Filed Weights   12/01/20 0328 12/02/20 0300 12/16/20 2100  Weight: 74 kg 75.2 kg 66.6 kg    Examination:  General exam: : NAD Respiratory system: CTA B anterior lung fields.  No wheezes, no rhonchi.  Speaking in full sentences.  Normal respiratory effort. Cardiovascular system: Regular rate and rhythm with 3/6 SEM left sternal border.  No JVD.  No lower extremity edema. Gastrointestinal system: Abdomen soft, nontender, nondistended, positive bowel sounds.  No rebound.  No guarding. Central nervous system: Alert and oriented. No focal neurological deficits. Extremities: Symmetric 5 x 5 power. Skin: No rashes, lesions or ulcers Psychiatry: Judgement and insight appear normal. Mood & affect appropriate.    Data Reviewed: I have personally reviewed following labs and imaging studies  CBC: Recent Labs  Lab 12/17/20 1102 12/18/20 0656 12/19/20 0821 12/20/20 0223  WBC 11.0* 10.1 9.2 10.4  NEUTROABS 6.6 5.8 5.1  --   HGB 7.2* 7.4* 6.8* 8.6*  HCT 23.0* 23.6* 21.5* 25.7*  MCV 84.6 84.6 84.0 81.8  PLT 311 306 306 300    Basic Metabolic Panel: Recent Labs  Lab 12/17/20 1102 12/18/20 0656 12/19/20 0821  12/20/20 0223 12/21/20 0500  NA 135 135 134* 133* 130*  K 3.7 3.7 3.7 3.5 3.9  CL 103 101 102 99 100  CO2 25 25 24 24 24   GLUCOSE 101* 101* 94 97 96  BUN 11 13 15 14 14   CREATININE 1.23* 1.28* 1.32* 1.33* 1.31*  CALCIUM 8.4* 8.5* 8.4* 8.4* 8.8*  MG 1.9 2.1  --  1.9  --   PHOS  --  5.1*  --   --   --     GFR: Estimated Creatinine Clearance: 57.7 mL/min (A) (by C-G formula based on SCr of 1.31 mg/dL (H)).  Liver Function Tests: Recent Labs  Lab 12/18/20 0656  ALBUMIN 1.9*    CBG: No results for input(s): GLUCAP in the last 168 hours.   No results found for this or any previous visit (from the past 240 hour(s)).       Radiology Studies: No results found.      Scheduled Meds: . sodium chloride   Intravenous Once  . amLODipine  10 mg Oral Daily  . chlorhexidine  15 mL Mouth Rinse BID  . Chlorhexidine Gluconate Cloth  6 each Topical Daily  . cloNIDine  0.1 mg Oral BID  . dicyclomine  20 mg Oral TID AC & HS  . docusate sodium  100 mg Oral BID  . feeding supplement  1 Container Oral BID BM  . feeding supplement  237 mL Oral BID BM  . folic acid  1 mg Oral Daily  . mouth rinse  15 mL Mouth  Rinse q12n4p  . methadone  10 mg Oral Q8H  . multivitamin with minerals  1 tablet Oral Daily  . pantoprazole  40 mg Oral Daily  . polyethylene glycol  17 g Oral BID  . QUEtiapine  25 mg Oral BID  . senna  2 tablet Oral Daily  . sodium chloride flush  10-40 mL Intracatheter Q12H  . thiamine  100 mg Oral Daily   Continuous Infusions: . sodium chloride Stopped (12/21/20 2338)  . DAPTOmycin (CUBICIN)  IV Stopped (12/21/20 2339)  . lactated ringers 10 mL/hr at 12/17/20 2351     LOS: 36 days    Time spent: 35 minutes    Ramiro Harvest, MD [facility]   To contact the attending provider between 7A-7P or the covering provider during after hours 7P-7A, please log into the web site [email or web address] and access using universal [place] password for that web site.  If you do not have the password, please call the hospital operator.  12/22/2020, 12:17 PM

## 2020-12-22 NOTE — Progress Notes (Signed)
PT Cancellation Note  Patient Details Name: Anne Bautista MRN: [id number] DOB: 27-Apr-1986   Cancelled Treatment:    Reason Eval/Treat Not Completed: Patient declined, no reason specified. Significant other present and laying in bed with patient. Max encouragement provided for participation however pt declining, asking PT to return "this evening". Explained therapy will likely not be able to return later and pt expressed understanding. Will check back as schedule allows.   GER NICKS 12/22/2020, 2:12 PM   Conni Slipper, PT, DPT Acute Rehabilitation Services Pager: [phone] Office: [phone]

## 2020-12-22 NOTE — Progress Notes (Signed)
Pharmacy Antibiotic Note  Anne Bautista is a 35 y.o. female admitted on 11/15/2020 with Sepsis 2/2 MRSA IE with septic emboli to both lungs, kidneys, R shoulder and possibly L ankle.,.  Pharmacy has been consulted for Daptomycin dosing.  Renal function has remained stable with SCr in the 1.2-1.4 range. CK today is 11 - so no concern for ADE related to Daptomycin. The dose was adjusted on 3/18 due to an updated weight. Will continue the current dosing and monitor.   Cefepime 2/12 >> 2/13 Vancomycin 2/12 >> 2/16 Dapto 2/16>>4/18 Doxy 2/16>>2/20  2/17: CK 280 2/20: CK 45 2/27: CK 23 3/13: CK 13 3/21: CK 11  2/12Bcx: 3/3 MRSA  2/12 COVIDPCR:neg 2/12 UCx: > 100K MRSA 2/14 Bcx: 1/4 MRSA 2/14 R shoulder fluid: MRSA 2/18 Bcx negative 3/5 Wcx: rare GPC 3/9 BCx >> NGf 3/9 UCx >>negF  Plan: -Dapto for MRSA TV IE 10mg /kg/24h -CK weekly on Mondays  Height: 5\' 7"  (170.2 cm) Weight: 66.6 kg (146 lb 13.2 oz) IBW/kg (Calculated) : 61.6  Temp (24hrs), Avg:98.9 F (37.2 C), Min:97.9 F (36.6 C), Max:100.2 F (37.9 C)  Recent Labs  Lab 12/17/20 1102 12/18/20 0656 12/19/20 0821 12/20/20 0223 12/21/20 0500  WBC 11.0* 10.1 9.2 10.4  --   CREATININE 1.23* 1.28* 1.32* 1.33* 1.31*    Estimated Creatinine Clearance: 57.7 mL/min (A) (by C-G formula based on SCr of 1.31 mg/dL (H)).    No Known Allergies  Thank you for allowing pharmacy to be a part of this patient's care.  12/22/20, PharmD, BCPS Clinical Pharmacist Clinical phone for 12/22/2020: Georgina Pillion 12/22/2020 9:39 AM   **Pharmacist phone directory can now be found on [email or web address] ([email or web address]).  Listed under [facility].

## 2020-12-22 NOTE — Progress Notes (Addendum)
[facility] PROGRESS NOTE  Anne Bautista DPO:[id number] DOB: [birth year] DOA: 11/15/2020 PCP: Patient, No Pcp Per  Status:  Remains inpatient appropriate because:Unsafe d/c plan and IV treatments appropriate due to intensity of illness or inability to take PO   Dispo: The patient is from: Home              Anticipated d/c is to: SNF recommended by PT due to physical debility              Barriers to discharge: Lack of funding-currently Medicaid potential, history of substance abuse.  Patient can discharge on 3/31 after receiving IV dose of oritavancin followed by several more weeks of oral linezolid if she can afford this medication.  Of note prior to admission patient was residing at the [facility].  **As of 3/21 patient now states she will be returning to the [facility] and she states her mother will be staying there with her.  Offered to contact patient's mother to update her on plans regarding procurement of medications and outpatient appointments but patient declined.**              Patient currently is not medically stable to d/c.  Patient will remain on IV daptomycin until 4/18 although she can discharge on 3/31 if able to afford oral linezolid.  As of 3/21 we have confirmed with TOC that out of pocket fee would be around 75 hours.  CM notified and plan is to provide MATCH to pay 100% for this prescription.   Difficult to place patient No   Level of care: Med-Surg  Code Status: Full Family Communication: Patient only DVT prophylaxis: SQ Heparin Vaccination status: Unknown  Foley catheter: No  HPI: 35 year old female with history of IV drug use, tobacco use, depression came to the hospital with myalgias and cough, he was septic on arrival admitted to the ICU.  She was found to have MRSA bacteremia, tricuspid valve endocarditis as well as septic emboli to lungs, kidneys.  Infectious disease and cardiothoracic surgery consulted.  Hospital course complicated by  worsening right shoulder pain found to have septic arthritis, with cultures showing yeast   Subjective: Patient awake and alert.  Affect somewhat sadder today.  Denies pain.  Updated [place] regarding discharge disposition and that she will be remaining in [place] as described above.  Objective: Vitals:   12/21/20 2342 12/22/20 0610  BP: 122/70 129/80  Pulse: 86 (!) 101  Resp: 18 15  Temp: 98.6 F (37 C) 99.1 F (37.3 C)  SpO2: 95% 100%    Intake/Output Summary (Last 24 hours) at 12/22/2020 0814 Last data filed at 12/21/2020 2030 Gross per 24 hour  Intake 10 ml  Output 200 ml  Net -190 ml   Filed Weights   12/01/20 0328 12/02/20 0300 12/16/20 2100  Weight: 74 kg 75.2 kg 66.6 kg    Exam:  Constitutional: Alert, calm, no acute distress Respiratory: Lung sounds are clear, stable on room air.  No peripheral edema Cardiovascular: Regular pulse and no resting tachycardia.  No peripheral edema.  Grade 3/5 systolic murmur left sternal border fourth intercostal space.   Abdomen: Soft, bowel sounds present.  Eating well.  LBM 3/9 Musculoskeletal: Surgical drain and sutures have been removed from right shoulder region just above Neurologic: CN 2-12 grossly intact.  No focal neurological deficits.  Ambulatory without difficulty Psychiatric: Alert and oriented x3.  Flatter than usual affect today.   Assessment/Plan: Acute problems: Severe sepsis due to MRSA  bacteremia/ -Complicated by septic emboli to both lungs and kidneys, right shoulder possibly septic left ankle (MRI refused by patient).  She also has a right subdeltoid abscess. Left ankle improving, no further issues, no pain -ID, cardiothoracic surgery, orthopedic surgery following -Status post incision and drainage of right shoulder subdeltoid abscess with multiple cavities on 3/5 by Dr. Susa SimmondsAdair. Drain dc'd by ortho -Continue daptomycin until 01/19/2021, patient can discharge on 3/31 to complete 2 weeks of oral linezolid now.   Monitor CK. Shoulder cultures revealed yeast so started on anidulafungin but culture result corrected as rare gram-positive cocci  -Last blood cultures negative on 12/10/2020, has a PICC line since 2/28.  Tricuspid valve endocarditis -Cardiothoracic surgery evaluated patient early in the hospitalization for possible angio VAC debridement.  -Echocardiogram on 3/14 revealed persistent tricuspid valve endocarditis but due to location not amenable to angio vac.  Dr. Cliffton AstersLightfoot recommends following up in the outpatient setting once patient "clean" to discuss surgical intervention.  She is hemodynamically stable without any evidence of acute heart failure. -Recent chest x-ray unremarkable no evidence of heart failure.  BNP stable in the 450 range  Acute blood loss anemia, iron deficiency anemia  with episode of symptomatic anemia -post operatively, received a total of 2U pRBC, one on 3/6 and one on 3/7 -Hemoglobin had drifted downward to 6.8 on 3/18 and patient was given 2 units of PRBCs with improvement of hemoglobin to 8.6 -Given IV Iron today per attending.  Septic emboli to left ankle and right shoulder -Status post drainage of right subdeltoid abscess -Drain removed by orthopedic team on 3/10 with documentation that sutures to remain in place for an additional week  Migraine headache -Patient reports recent nausea secondary to migraine holocord of the weekend and normally talks Excedrin Migraine which has been ordered as of 3/14.  Fever -Has been having waxing and waning febrile state throughout the admission without any new sources of infection determined.  IV drug use w/ Heroin, cocaine use, marijuana, tobacco  -cessation was encouraged.   -UDS was positive for cocaine.   -Hep C and HIV are negative.  RPR is negative. -Is on methadone and she would like to continue this after discharge.  Did not wish to transition to Suboxone after discharge. -3/21 Attending spoke at length w/ patient and  plan is to taper Suboxone prior to dc -TOC staff note has given patient contact information for MAT facilities in the area that she is responsible for contacting to arrange for outpatient follow-up  Acute hypoxic respiratory failure, narcotic withdrawal, bilateral pulmonary septic emboli  -hypoxia resolved, remains on room air   Nonoliguric AKI with ATN  -creatinine peaked at 2.8, fluctuating but overall stable, 1.4 today   Debility/deconditioning PT note 3/18 Patient requires max encouragement to participate with therapy due to significant other being present and lying in bed with patient. Patient refused ambulation this session due to receiving blood transfusion. Patient performed standing exercises focusing on strengthening and balance. Patient requests HEP to perform in the room, provided patient with HEP and will progress as needed. Updated recommendation to OPPT.   OT NOTE 3/18: Pt progressing slowly towards established OT goals. Performing PROM of shoulder for forward flexion, abduction, and external rotation; x5 in supine. Providing demonstration and handout for UE exercises with level two theraband. Pt performing 5 reps of each with Max encouragement. Significant other arriving midway through session. Continue to recommend dc to home once medically stable and will continue to follow acutely as admitted.  Moderate protein calorie malnutrition Nutrition Problem: Moderate Malnutrition Etiology: social / environmental circumstances (IVDU) Signs/Symptoms: mild fat depletion,moderate fat depletion,mild muscle depletion,moderate muscle depletion Interventions: Ensure Enlive (each supplement provides 350kcal and 20 grams of protein),MVI Estimated body mass index is 23 kg/m as calculated from the following:   Height as of this encounter:  (1.702 m).   Weight as of this encounter: 66.6 kg.   Wounds: Incision (Closed) 12/06/20 Shoulder Right (Active)  Date First Assessed/Time First  Assessed: 12/06/20 0914   Location: Shoulder  Location Orientation: Right    Assessments 12/06/2020  8:56 AM 12/22/2020  9:20 AM  Dressing Type Adhesive bandage None  Dressing Clean;Dry;Intact -  Site / Wound Assessment Dressing in place / Unable to assess Clean;Dry  Closure - Skin glue  Drainage Amount None None     No Linked orders to display     Other problems: Hypertension -continue current regimen  Acute cystitis without hematuria  -MRSA in cultures, she is on antibiotics  Data Reviewed: Basic Metabolic Panel: Recent Labs  Lab 12/17/20 1102 12/18/20 0656 12/19/20 0821 12/20/20 0223 12/21/20 0500  NA 135 135 134* 133* 130*  K 3.7 3.7 3.7 3.5 3.9  CL 103 101 102 99 100  CO2 GLUCOSE 101* 101* 94 97 96  BUN CREATININE 1.23* 1.28* 1.32* 1.33* 1.31*  CALCIUM 8.4* 8.5* 8.4* 8.4* 8.8*  MG 1.9 2.1  --  1.9  --   PHOS  --  5.1*  --   --   --    Liver Function Tests: Recent Labs  Lab 12/18/20 0656  ALBUMIN 1.9*   No results for input(s): LIPASE, AMYLASE in the last 168 hours. No results for input(s): AMMONIA in the last 168 hours. CBC: Recent Labs  Lab 12/17/20 1102 12/18/20 0656 12/19/20 0821 12/20/20 0223  WBC 11.0* 10.1 9.2 10.4  NEUTROABS 6.6 5.8 5.1  --   HGB 7.2* 7.4* 6.8* 8.6*  HCT 23.0* 23.6* 21.5* 25.7*  MCV 84.6 84.6 84.0 81.8  PLT 311 306 306 300   Cardiac Enzymes: Recent Labs  Lab 12/22/20 0500  CKTOTAL 11*   BNP (last 3 results) Recent Labs    11/27/20 0430 11/28/20 0505 12/10/20 1600  BNP 670.1* 455.5* 458.5*    ProBNP (last 3 results) No results for input(s): PROBNP in the last 8760 hours.  CBG: No results for input(s): GLUCAP in the last 168 hours.  No results found for this or any previous visit (from the past 240 hour(s)).   Studies: No results found.  Scheduled Meds: . sodium chloride   Intravenous Once  . amLODipine  10 mg Oral Daily  . chlorhexidine  15 mL Mouth Rinse BID  .  Chlorhexidine Gluconate Cloth  6 each Topical Daily  . cloNIDine  0.1 mg Oral BID  . dicyclomine  20 mg Oral TID AC & HS  . docusate sodium  100 mg Oral BID  . feeding supplement  1 Container Oral BID BM  . feeding supplement  237 mL Oral BID BM  . folic acid  1 mg Oral Daily  . mouth rinse  15 mL Mouth Rinse q12n4p  . methadone  10 mg Oral Q8H  . multivitamin with minerals  1 tablet Oral Daily  . pantoprazole  40 mg Oral Daily  . polyethylene glycol  17 g Oral BID  . QUEtiapine  25 mg Oral BID  . senna  2 tablet  Oral Daily  . sodium chloride flush  10-40 mL Intracatheter Q12H  . thiamine  100 mg Oral Daily   Continuous Infusions: . sodium chloride Stopped (12/21/20 2338)  . DAPTOmycin (CUBICIN)  IV Stopped (12/21/20 2339)  . lactated ringers 10 mL/hr at 12/17/20 2351    Principal Problem:   Endocarditis of tricuspid valve Active Problems:   Severe sepsis due to methicillin resistant Staphylococcus aureus (MRSA) with acute organ dysfunction (HCC)   Poor dentition   Malnutrition (HCC)   Thrombocytopenia (HCC)   Tobacco dependence   Acute cystitis without hematuria   Malnutrition of moderate degree   Abdominal pain   Left foot pain   Septic embolism (HCC)   Staphylococcal arthritis of right shoulder (HCC)   MRSA bacteremia   Acute on chronic respiratory failure with hypoxia (HCC)   Sepsis (HCC)   Consultants: Infectious disease Cardiothoracic surgery Orthopedic surgery  Procedures:  2D echocardiogram  Anesthesia on 3/3  Irrigation and debridement right shoulder abscess on 3/5  Antibiotics: Anti-infectives (From admission, onward)   Start     Dose/Rate Route Frequency Ordered Stop   12/19/20 2000  DAPTOmycin (CUBICIN) 650 mg in sodium chloride 0.9 % IVPB        650 mg 226 mL/hr over 30 Minutes Intravenous Daily 12/19/20 0900 01/19/21 2359   12/12/20 2000  DAPTOmycin (CUBICIN) 750 mg in sodium chloride 0.9 % IVPB  Status:  Discontinued        750 mg 230  mL/hr over 30 Minutes Intravenous Daily 12/12/20 0719 12/19/20 0900   12/09/20 1200  anidulafungin (ERAXIS) 100 mg in sodium chloride 0.9 % 100 mL IVPB  Status:  Discontinued        100 mg 78 mL/hr over 100 Minutes Intravenous Every 24 hours 12/08/20 1109 12/09/20 1013   12/08/20 1200  anidulafungin (ERAXIS) 200 mg in sodium chloride 0.9 % 200 mL IVPB        200 mg 78 mL/hr over 200 Minutes Intravenous  Once 12/08/20 1109 12/08/20 1724   12/06/20 0833  vancomycin (VANCOCIN) powder  Status:  Discontinued          As needed 12/06/20 0834 12/06/20 0852   12/06/20 0600  ceFAZolin (ANCEF) IVPB 2g/100 mL premix        2 g 200 mL/hr over 30 Minutes Intravenous On call to O.R. 12/06/20 0306 12/06/20 0755   11/28/20 2000  DAPTOmycin (CUBICIN) 764 mg in sodium chloride 0.9 % IVPB  Status:  Discontinued        10 mg/kg  76.4 kg 230.6 mL/hr over 30 Minutes Intravenous Daily 11/28/20 0727 12/12/20 0719   11/24/20 2000  DAPTOmycin (CUBICIN) 827 mg in sodium chloride 0.9 % IVPB  Status:  Discontinued        10 mg/kg  82.7 kg 233.1 mL/hr over 30 Minutes Intravenous Daily 11/24/20 0821 11/28/20 0727   11/19/20 1000  DAPTOmycin (CUBICIN) 770 mg in sodium chloride 0.9 % IVPB  Status:  Discontinued        10 mg/kg  77 kg 230.8 mL/hr over 30 Minutes Intravenous Daily 11/19/20 0908 11/24/20 0821   11/19/20 1000  doxycycline (VIBRA-TABS) tablet 100 mg        100 mg Oral Every 12 hours 11/19/20 0908 11/23/20 2123   11/19/20 0439  vancomycin variable dose per unstable renal function (pharmacist dosing)  Status:  Discontinued         Does not apply See admin instructions 11/19/20 0439 11/19/20 0853   11/16/20 2000  vancomycin (VANCOREADY) IVPB 1250 mg/250 mL  Status:  Discontinued        1,250 mg 166.7 mL/hr over 90 Minutes Intravenous Every 24 hours 11/15/20 1933 11/16/20 1108   11/16/20 1200  vancomycin (VANCOCIN) IVPB 1000 mg/200 mL premix  Status:  Discontinued        1,000 mg 200 mL/hr over 60 Minutes  Intravenous Every 12 hours 11/16/20 1108 11/19/20 0439   11/16/20 0745  metroNIDAZOLE (FLAGYL) IVPB 500 mg        500 mg 100 mL/hr over 60 Minutes Intravenous  Once 11/16/20 0741 11/16/20 0907   11/15/20 2237  vancomycin (VANCOCIN) 500 MG powder       Note to Pharmacy: Ihor Dow   : cabinet override      11/15/20 2237 11/16/20 1044   11/15/20 2000  ceFEPIme (MAXIPIME) 2 g in sodium chloride 0.9 % 100 mL IVPB  Status:  Discontinued        2 g 200 mL/hr over 30 Minutes Intravenous Every 12 hours 11/15/20 1933 11/16/20 1212   11/15/20 1945  vancomycin (VANCOREADY) IVPB 1500 mg/300 mL  Status:  Discontinued        1,500 mg 150 mL/hr over 120 Minutes Intravenous  Once 11/15/20 1933 11/15/20 1939   11/15/20 1945  vancomycin (VANCOCIN) IVPB 1000 mg/200 mL premix       "And" Linked Group Details   1,000 mg 200 mL/hr over 60 Minutes Intravenous  Once 11/15/20 1939 11/15/20 2231   11/15/20 1945  vancomycin (VANCOREADY) IVPB 500 mg/100 mL       "And" Linked Group Details   500 mg 100 mL/hr over 60 Minutes Intravenous  Once 11/15/20 1939 11/16/20 0002       Time spent: 20 minutes    Junious Silk ANP  [facility] 7 am - 330 pm/M-F for direct patient care and secure chat Please refer to Amion for contact info 36  days

## 2020-12-23 DIAGNOSIS — F17200 Nicotine dependence, unspecified, uncomplicated: Secondary | ICD-10-CM

## 2020-12-23 DIAGNOSIS — D509 Iron deficiency anemia, unspecified: Secondary | ICD-10-CM

## 2020-12-23 LAB — BASIC METABOLIC PANEL
Anion gap: 9 (ref 5–15)
BUN: 15 mg/dL (ref 6–20)
CO2: 24 mmol/L (ref 22–32)
Calcium: 9.1 mg/dL (ref 8.9–10.3)
Chloride: 103 mmol/L (ref 98–111)
Creatinine, Ser: 1.26 mg/dL — ABNORMAL HIGH (ref 0.44–1.00)
GFR, Estimated: 57 mL/min — ABNORMAL LOW (ref >60)
Glucose, Bld: 98 mg/dL (ref 70–99)
Potassium: 3.7 mmol/L (ref 3.5–5.1)
Sodium: 136 mmol/L (ref 135–145)

## 2020-12-23 LAB — CBC
HCT: 32.9 % — ABNORMAL LOW (ref 36.0–46.0)
Hemoglobin: 10.6 g/dL — ABNORMAL LOW (ref 12.0–15.0)
MCH: 27 pg (ref 26.0–34.0)
MCHC: 32.2 g/dL (ref 30.0–36.0)
MCV: 83.9 fL (ref 80.0–100.0)
Platelets: 247 10*3/uL (ref 150–400)
RBC: 3.92 MIL/uL (ref 3.87–5.11)
RDW: 16.4 % — ABNORMAL HIGH (ref 11.5–15.5)
WBC: 7.6 10*3/uL (ref 4.0–10.5)
nRBC: 0 % (ref 0.0–0.2)

## 2020-12-23 MED ORDER — METHADONE HCL 10 MG PO TABS
10.0000 mg | ORAL_TABLET | Freq: Two times a day (BID) | ORAL | Status: DC
  Administered 2020-12-23 – 2020-12-26 (×7): 10 mg via ORAL
  Filled 2020-12-23 (×7): qty 1

## 2020-12-23 NOTE — Progress Notes (Signed)
[facility] PROGRESS NOTE  Anne CoulterLaura Bautista ZOX:[id number] DOB: 01/23/1986 DOA: 11/15/2020 PCP: Patient, No Pcp Per  Status:  Remains inpatient appropriate because:Unsafe d/c plan and IV treatments appropriate due to intensity of illness or inability to take PO   Dispo: The patient is from: Home              Anticipated d/c is to: SNF recommended by PT due to physical debility              Barriers to discharge: Lack of funding-currently Medicaid potential, history of substance abuse.  Patient can discharge on 3/31 after receiving IV dose of oritavancin followed by several more weeks of oral linezolid if she can afford this medication.  Of note prior to admission patient was residing at the [facility] room [address].  **As of 3/21 patient now states she will be returning to the [facility] and she states her mother will be staying there with her.  Offered to contact patient's mother to update her on plans regarding procurement of medications and outpatient appointments but patient declined.**              Patient currently is not medically stable to d/c.  Patient will remain on IV daptomycin until 4/18 although she can discharge on 3/31 if able to afford oral linezolid.  As of 3/21 we have confirmed with TOC that out of pocket fee would be around 75 hours.  CM notified and plan is to provide MATCH to pay 100% for this prescription.   Difficult to place patient No   Level of care: Med-Surg  Code Status: Full Family Communication: Patient only DVT prophylaxis: SQ Heparin Vaccination status: Unknown  Foley catheter: No  HPI: 35 year old female with history of IV drug use, tobacco use, depression came to the hospital with myalgias and cough, he was septic on arrival admitted to the ICU.  She was found to have MRSA bacteremia, tricuspid valve endocarditis as well as septic emboli to lungs, kidneys.  Infectious disease and cardiothoracic surgery consulted.  Hospital course complicated by  worsening right shoulder pain found to have septic arthritis, with cultures showing yeast   Subjective: Patient is awake and in good spirits today.  No shoulder pain.  Continues to report that she wishes to wane and hopefully discontinue methadone by discharge.  Objective: Vitals:   12/23/20 0411 12/23/20 0819  BP: 118/69 127/76  Pulse: 88 89  Resp: 19 18  Temp: 97.9 F (36.6 C) 98.4 F (36.9 C)  SpO2: 100% 98%    Intake/Output Summary (Last 24 hours) at 12/23/2020 0833 Last data filed at 12/22/2020 2200 Gross per 24 hour  Intake 233.44 ml  Output --  Net 233.44 ml   Filed Weights   12/01/20 0328 12/02/20 0300 12/16/20 2100  Weight: 74 kg 75.2 kg 66.6 kg    Exam:  Constitutional: Awake, calm, no acute distress Respiratory: Lung sounds are clear, stable on room air Cardiovascular: Pulses regular and nontachycardic at rest.  No peripheral edema.  Grade 3/5 systolic murmur left sternal border fourth intercostal space.   Abdomen: LBM 3/9; soft nontender nondistended.  Eating well. Musculoskeletal: Surgical drain and sutures have been removed from right shoulder region just above Neurologic: CN 2-12 grossly intact.  No focal neurological deficits.  Ambulatory without difficulty Psychiatric: Alert and oriented x3, pleasant affect.   Assessment/Plan: Acute problems: Severe sepsis due to MRSA bacteremia/ -Complicated by septic emboli to both lungs and kidneys, right shoulder possibly septic  left ankle (MRI refused by patient).  She also has a right subdeltoid abscess. Left ankle improving, no further issues, no pain -ID, cardiothoracic surgery, orthopedic surgery following -Status post incision and drainage of right shoulder subdeltoid abscess with multiple cavities on 3/5 by Dr. Susa Simmonds. Drain dc'd by ortho -Continue daptomycin until 01/19/2021, patient can discharge on 3/31 to complete 2 weeks of oral linezolid now.  Monitor CK. Shoulder cultures revealed yeast so started on  anidulafungin but culture result corrected as rare gram-positive cocci  -Last blood cultures negative on 12/10/2020, has a PICC line since 2/28.  Tricuspid valve endocarditis -Cardiothoracic surgery evaluated patient early in the hospitalization for possible angio VAC debridement.  -Echocardiogram on 3/14 revealed persistent tricuspid valve endocarditis but due to location not amenable to angio vac.  Dr. Cliffton Asters recommends following up in the outpatient setting once patient "clean" to discuss surgical intervention.  She is hemodynamically stable without any evidence of acute heart failure. -Recent chest x-ray unremarkable no evidence of heart failure.  BNP stable in the 450 range  Acute blood loss anemia, iron deficiency anemia  with episode of symptomatic anemia -post operatively, received a total of 2U pRBC, one on 3/6 and one on 3/7 -Hemoglobin had drifted downward to 6.8 on 3/18 and patient was given 2 units of PRBCs with improvement of hemoglobin to 8.6 -Given IV Iron on 3/21 per attending.  Hemoglobin on 3/22 has increased from 8.6-10.6.  Creatinine stable at 1.26.  Sodium stable at 136  Septic emboli to left ankle and right shoulder -Status post drainage of right subdeltoid abscess -Drain removed by orthopedic team on 3/10 with documentation that sutures to remain in place for an additional week  Migraine headache -Patient reports recent nausea secondary to migraine holocord of the weekend and normally talks Excedrin Migraine which has been ordered as of 3/14.  Fever -Has been having waxing and waning febrile state throughout the admission without any new sources of infection determined.  IV drug use w/ Heroin, cocaine use, marijuana, tobacco  -cessation was encouraged.   -UDS was positive for cocaine.   -Hep C and HIV are negative.  RPR is negative. -Is on methadone and she would like to continue this after discharge.  Did not wish to transition to Suboxone after discharge. -3/21  Attending spoke at length w/ patient and plan is to taper Suboxone prior to dc; as of 3/21 dose decr to 10 mg BID; current plan is to taper down every 5 days with next dosage anticipated to be 10 mg daily for an additional 5 days then likely will require 5 mg daily for 3 to 5 days after discharge -Methadone can interact with discharge antibiotic linezolid and cause QTC prolongation therefore patient will need to have EKG obtained prior to discharge -[facility] staff note has given patient contact information for MAT facilities in the area that she is responsible for contacting to arrange for outpatient follow-up  Acute hypoxic respiratory failure, narcotic withdrawal, bilateral pulmonary septic emboli  -hypoxia resolved, remains on room air   Nonoliguric AKI with ATN  -creatinine peaked at 2.8, fluctuating but overall stable, 1.4 today   Debility/deconditioning PT note 3/18 Patient requires max encouragement to participate with therapy due to significant other being present and lying in bed with patient. Patient refused ambulation this session due to receiving blood transfusion. Patient performed standing exercises focusing on strengthening and balance. Patient requests HEP to perform in the room, provided patient with HEP and will progress as needed. Updated  recommendation to OPPT.   OT NOTE 3/18: Pt progressing slowly towards established OT goals. Performing PROM of shoulder for forward flexion, abduction, and external rotation; x5 in supine. Providing demonstration and handout for UE exercises with level two theraband. Pt performing 5 reps of each with Max encouragement. Significant other arriving midway through session. Continue to recommend dc to home once medically stable and will continue to follow acutely as admitted.   Moderate protein calorie malnutrition Nutrition Problem: Moderate Malnutrition Etiology: social / environmental circumstances (IVDU) Signs/Symptoms: mild fat depletion,moderate  fat depletion,mild muscle depletion,moderate muscle depletion Interventions: Ensure Enlive (each supplement provides 350kcal and 20 grams of protein),MVI Estimated body mass index is 23 kg/m as calculated from the following:   Height as of this encounter:  (1.702 m).   Weight as of this encounter: 66.6 kg.   Wounds: Incision (Closed) 12/06/20 Shoulder Right (Active)  Date First Assessed/Time First Assessed: 12/06/20 0914   Location: Shoulder  Location Orientation: Right    Assessments 12/06/2020  8:56 AM 12/22/2020  8:30 PM  Dressing Type Adhesive bandage None  Dressing Clean;Dry;Intact --  Site / Wound Assessment Dressing in place / Unable to assess Clean;Dry  Drainage Amount None --     No Linked orders to display     Other problems: Hypertension -continue current regimen  Acute cystitis without hematuria  -MRSA in cultures, she is on antibiotics  Data Reviewed: Basic Metabolic Panel: Recent Labs  Lab 12/17/20 1102 12/18/20 0656 12/19/20 0821 12/20/20 0223 12/21/20 0500 12/23/20 0518  NA 135 135 134* 133* 130* 136  K 3.7 3.7 3.7 3.5 3.9 3.7  CL 103 101 102 99 100 103  CO2 GLUCOSE 101* 101* 94 97 96 98  BUN CREATININE 1.23* 1.28* 1.32* 1.33* 1.31* 1.26*  CALCIUM 8.4* 8.5* 8.4* 8.4* 8.8* 9.1  MG 1.9 2.1  --  1.9  --   --   PHOS  --  5.1*  --   --   --   --    Liver Function Tests: Recent Labs  Lab 12/18/20 0656  ALBUMIN 1.9*   No results for input(s): LIPASE, AMYLASE in the last 168 hours. No results for input(s): AMMONIA in the last 168 hours. CBC: Recent Labs  Lab 12/17/20 1102 12/18/20 0656 12/19/20 0821 12/20/20 0223 12/23/20 0518  WBC 11.0* 10.1 9.2 10.4 7.6  NEUTROABS 6.6 5.8 5.1  --   --   HGB 7.2* 7.4* 6.8* 8.6* 10.6*  HCT 23.0* 23.6* 21.5* 25.7* 32.9*  MCV 84.6 84.6 84.0 81.8 83.9  PLT 311 306 306 300 247   Cardiac Enzymes: Recent Labs  Lab 12/22/20 0500  CKTOTAL 11*   BNP (last 3  results) Recent Labs    11/27/20 0430 11/28/20 0505 12/10/20 1600  BNP 670.1* 455.5* 458.5*    ProBNP (last 3 results) No results for input(s): PROBNP in the last 8760 hours.  CBG: No results for input(s): GLUCAP in the last 168 hours.  No results found for this or any previous visit (from the past 240 hour(s)).   Studies: No results found.  Scheduled Meds: . sodium chloride   Intravenous Once  . amLODipine  10 mg Oral Daily  . chlorhexidine  15 mL Mouth Rinse BID  . Chlorhexidine Gluconate Cloth  6 each Topical Daily  . cloNIDine  0.1 mg Oral BID  . dicyclomine  20 mg Oral TID AC & HS  .  docusate sodium  100 mg Oral BID  . feeding supplement  1 Container Oral BID BM  . feeding supplement  237 mL Oral BID BM  . folic acid  1 mg Oral Daily  . mouth rinse  15 mL Mouth Rinse q12n4p  . methadone  10 mg Oral Q12H  . multivitamin with minerals  1 tablet Oral Daily  . pantoprazole  40 mg Oral Daily  . polyethylene glycol  17 g Oral BID  . QUEtiapine  25 mg Oral BID  . senna  2 tablet Oral Daily  . sodium chloride flush  10-40 mL Intracatheter Q12H  . thiamine  100 mg Oral Daily   Continuous Infusions: . sodium chloride Stopped (12/21/20 2338)  . DAPTOmycin (CUBICIN)  IV 650 mg (12/22/20 2014)  . lactated ringers 10 mL/hr at 12/17/20 2351    Principal Problem:   Endocarditis of tricuspid valve Active Problems:   Severe sepsis due to methicillin resistant Staphylococcus aureus (MRSA) with acute organ dysfunction (HCC)   Poor dentition   Malnutrition (HCC)   Thrombocytopenia (HCC)   Tobacco dependence   Acute cystitis without hematuria   Malnutrition of moderate degree   Abdominal pain   Left foot pain   Septic embolism (HCC)   Staphylococcal arthritis of right shoulder (HCC)   MRSA bacteremia   Acute on chronic respiratory failure with hypoxia (HCC)   Sepsis (HCC)   Consultants: Infectious disease Cardiothoracic surgery Orthopedic  surgery  Procedures:  2D echocardiogram  Anesthesia on 3/3  Irrigation and debridement right shoulder abscess on 3/5  Antibiotics: Anti-infectives (From admission, onward)   Start     Dose/Rate Route Frequency Ordered Stop   12/22/20 0000  linezolid (ZYVOX) 600 MG tablet        600 mg Oral 2 times daily 12/22/20 0830     12/19/20 2000  DAPTOmycin (CUBICIN) 650 mg in sodium chloride 0.9 % IVPB        650 mg 226 mL/hr over 30 Minutes Intravenous Daily 12/19/20 0900 01/19/21 2359   12/12/20 2000  DAPTOmycin (CUBICIN) 750 mg in sodium chloride 0.9 % IVPB  Status:  Discontinued        750 mg 230 mL/hr over 30 Minutes Intravenous Daily 12/12/20 0719 12/19/20 0900   12/09/20 1200  anidulafungin (ERAXIS) 100 mg in sodium chloride 0.9 % 100 mL IVPB  Status:  Discontinued        100 mg 78 mL/hr over 100 Minutes Intravenous Every 24 hours 12/08/20 1109 12/09/20 1013   12/08/20 1200  anidulafungin (ERAXIS) 200 mg in sodium chloride 0.9 % 200 mL IVPB        200 mg 78 mL/hr over 200 Minutes Intravenous  Once 12/08/20 1109 12/08/20 1724   12/06/20 0833  vancomycin (VANCOCIN) powder  Status:  Discontinued          As needed 12/06/20 0834 12/06/20 0852   12/06/20 0600  ceFAZolin (ANCEF) IVPB 2g/100 mL premix        2 g 200 mL/hr over 30 Minutes Intravenous On call to O.R. 12/06/20 0306 12/06/20 0755   11/28/20 2000  DAPTOmycin (CUBICIN) 764 mg in sodium chloride 0.9 % IVPB  Status:  Discontinued        10 mg/kg  76.4 kg 230.6 mL/hr over 30 Minutes Intravenous Daily 11/28/20 0727 12/12/20 0719   11/24/20 2000  DAPTOmycin (CUBICIN) 827 mg in sodium chloride 0.9 % IVPB  Status:  Discontinued        10 mg/kg  82.7 kg 233.1 mL/hr over 30 Minutes Intravenous Daily 11/24/20 0821 11/28/20 0727   11/19/20 1000  DAPTOmycin (CUBICIN) 770 mg in sodium chloride 0.9 % IVPB  Status:  Discontinued        10 mg/kg  77 kg 230.8 mL/hr over 30 Minutes Intravenous Daily 11/19/20 0908 11/24/20 0821   11/19/20  1000  doxycycline (VIBRA-TABS) tablet 100 mg        100 mg Oral Every 12 hours 11/19/20 0908 11/23/20 2123   11/19/20 0439  vancomycin variable dose per unstable renal function (pharmacist dosing)  Status:  Discontinued         Does not apply See admin instructions 11/19/20 0439 11/19/20 0853   11/16/20 2000  vancomycin (VANCOREADY) IVPB 1250 mg/250 mL  Status:  Discontinued        1,250 mg 166.7 mL/hr over 90 Minutes Intravenous Every 24 hours 11/15/20 1933 11/16/20 1108   11/16/20 1200  vancomycin (VANCOCIN) IVPB 1000 mg/200 mL premix  Status:  Discontinued        1,000 mg 200 mL/hr over 60 Minutes Intravenous Every 12 hours 11/16/20 1108 11/19/20 0439   11/16/20 0745  metroNIDAZOLE (FLAGYL) IVPB 500 mg        500 mg 100 mL/hr over 60 Minutes Intravenous  Once 11/16/20 0741 11/16/20 0907   11/15/20 2237  vancomycin (VANCOCIN) 500 MG powder       Note to Pharmacy: Ihor Dow   : cabinet override      11/15/20 2237 11/16/20 1044   11/15/20 2000  ceFEPIme (MAXIPIME) 2 g in sodium chloride 0.9 % 100 mL IVPB  Status:  Discontinued        2 g 200 mL/hr over 30 Minutes Intravenous Every 12 hours 11/15/20 1933 11/16/20 1212   11/15/20 1945  vancomycin (VANCOREADY) IVPB 1500 mg/300 mL  Status:  Discontinued        1,500 mg 150 mL/hr over 120 Minutes Intravenous  Once 11/15/20 1933 11/15/20 1939   11/15/20 1945  vancomycin (VANCOCIN) IVPB 1000 mg/200 mL premix       "And" Linked Group Details   1,000 mg 200 mL/hr over 60 Minutes Intravenous  Once 11/15/20 1939 11/15/20 2231   11/15/20 1945  vancomycin (VANCOREADY) IVPB 500 mg/100 mL       "And" Linked Group Details   500 mg 100 mL/hr over 60 Minutes Intravenous  Once 11/15/20 1939 11/16/20 0002       Time spent: 20 minutes    Junious Silk ANP  [facility] 7 am - 330 pm/M-F for direct patient care and secure chat Please refer to Amion for contact info 37  days

## 2020-12-23 NOTE — Progress Notes (Signed)
OT Cancellation Note  Patient Details Name: Anne Bautista MRN: [id number] DOB: 1986/01/14   Cancelled Treatment:    Reason Eval/Treat Not Completed: Patient declined, no reason specified- pt declined OT, reports just finishing with PT and not up for further therapy at this time, but maybe later.  Reports using band for exercises, and exercise program going well.  Will follow and see as able.   Barry Brunner, OT Acute Rehabilitation Services Pager [phone] Office [phone]   Chancy Milroy 12/23/2020, 12:13 PM

## 2020-12-23 NOTE — Progress Notes (Signed)
Occupational Therapy Treatment/Discharge Patient Details Name: Anne Bautista MRN: [id number] DOB: [birth year] Today's Date: 12/23/2020    History of present illness Pt is a 35 y/o female admitted 2/14 with sepsis due to MRSA bacteremia in setting of IVDU. R shoulder pain s/p aspiration 2/14. Underwent I&D R shoulder 3/5. Pt had multiple subdeltoid abscesses. PMHx: IVDU, malnutrition, poor dentition.   OT comments  Per pt and staff, pt has been managing ADLs/mobility in room without AD and without assist including toileting, bathing, dressing. Pt denies any significant balance issues during mobility. Session focused on carryover of HEP with self limiting behaviors continually noted. Pt demo 2 UE HEP with 5 reps each and returned back to bed. Pt reports soreness after 10 reps of theraband exercises. Educated pt on alternating resistive exercises with AROM exercises to further progress shoulder ROM/strength within tolerance, as well as incorporating R UE use during daily tasks as pt is R-hand dominant. Advised pt that continued R shoulder exercise is needed to avoid stiffness and continued pain. Pt verbalized understanding and no further skilled OT services needed at acute level. OT to sign off. Please reconsult if needs change.    Follow Up Recommendations  No OT follow up    Equipment Recommendations  None recommended by OT    Recommendations for Other Services      Precautions / Restrictions Precautions Precautions: Fall Precaution Comments: right shoulder pain Restrictions Weight Bearing Restrictions: No       Mobility Bed Mobility Overal bed mobility: Independent             General bed mobility comments: No difficulty to manage any aspect of bed mobility    Transfers Overall transfer level: Independent Equipment used: None Transfers: Sit to/from [employer] transfer comment: Min use of UE's for support. No unsteadiness or LOB noted.    Balance Overall  balance assessment: No apparent balance deficits (not formally assessed) Sitting-balance support: No upper extremity supported;Feet supported Sitting balance-Leahy Scale: Normal     Standing balance support: No upper extremity supported;During functional activity Standing balance-Leahy Scale: Good Standing balance comment: standing reaching activities with no LOB and able to bring self back to midline                           ADL either performed or assessed with clinical judgement   ADL                                         General ADL Comments: Pt reports ambulating to bathroom without assist. Reports continued discomfort in shoulder but able to perform UB Dressing without assistance today. Pt reports able to ambulate in hallway with PT and no significant balance deficits. Session focused on assessment of HEP carryover with continued self limiting behaviors. Pt completed only 2 UE exercises with theraband. Pt reports being sore after completing exercises. Encouraged pt to alternate resistive exercises and stretches/AROM if soreness continues but continued movement of shoulder neccessary to regain function and prevent stiffness, further pain, etc     Vision   Vision Assessment?: No apparent visual deficits   Perception     Praxis      Cognition Arousal/Alertness: Awake/alert Behavior During Therapy: Flat affect Overall Cognitive Status: Within Functional Limits for tasks assessed  General Comments: Self limiting behavior noted requiring increased encouragement to participate        Exercises Exercises: General Upper Extremity General Exercises - Upper Extremity Shoulder Horizontal ABduction: Strengthening;Both;5 reps;Seated;Theraband Elbow Flexion: Strengthening;Right;5 reps;Seated;Theraband Theraband Level (Elbow Flexion): Level 1 (Yellow) Other Exercises Other Exercises: educated on AROM exercises  for R shoulder   Shoulder Instructions       General Comments Session focused on UE HEP and education on continued shoulder ROM/movement needed and incorporation of R UE into daily tasks as this is pt's dominant UE    Pertinent Vitals/ Pain       Pain Assessment: Faces Faces Pain Scale: Hurts a little bit Pain Location: R shoulder with exercises Pain Descriptors / Indicators: Discomfort;Sore Pain Intervention(s): Monitored during session  Home Living                                          Prior Functioning/Environment              Frequency  Min 1X/week        Progress Toward Goals  OT Goals(current goals can now be found in the care plan section)  Progress towards OT goals:  (highest potential met, HEP provided, self limiting behaviors - OT to DC)  Acute Rehab OT Goals Patient Stated Goal: to go home OT Goal Formulation: With patient Time For Goal Achievement: 12/30/20 Potential to Achieve Goals: Good  Plan Discharge plan needs to be updated;Other (comment) (self limiting, highest potential met)    Co-evaluation                 AM-PAC OT "6 Clicks" Daily Activity     Outcome Measure   Help from another person eating meals?: None Help from another person taking care of personal grooming?: None Help from another person toileting, which includes using toliet, bedpan, or urinal?: None Help from another person bathing (including washing, rinsing, drying)?: None Help from another person to put on and taking off regular upper body clothing?: None Help from another person to put on and taking off regular lower body clothing?: None 6 Click Score: 24    End of Session    OT Visit Diagnosis: Unsteadiness on feet (R26.81);Muscle weakness (generalized) (M62.81);Other symptoms and signs involving cognitive function;Pain Pain - Right/Left: Right Pain - part of body: Shoulder   Activity Tolerance Other (comment) (self limiting)   Patient  Left in bed;with call bell/phone within reach   Nurse Communication          Time: 7741-4239 OT Time Calculation (min): 16 min  Charges: OT General Charges $OT Visit: 1 Visit OT Treatments $Therapeutic Activity: 8-22 mins  Malachy Chamber, OTR/L Acute Rehab Services Office: [phone]   Layla Maw 12/23/2020, 2:20 PM

## 2020-12-23 NOTE — Progress Notes (Signed)
PROGRESS NOTE    Anne Bautista  ZOX:[id number] DOB: 1986/04/16 DOA: 11/15/2020 PCP: Patient, No Pcp Per    Chief Complaint  Patient presents with  . Generalized Body Aches    Brief Narrative:  Anne Bautista was admitted to the hospital with a working diagnosis of tricuspid valve endocarditis with MRSA bacteremia complicated with severe sepsis, septic emboli to the lungs and kidneys.  Right shoulder septic arthritis, right subdeltoid abscess. Patient with prolonged hospitalization, plan to complete IV antibiotics inpatient until April 20.  35 year old female past medical history for intravenous drug abuse, who presented with cough and myalgias.  Positive right shoulder pain, associated with dyspnea and fever.  Ongoing symptoms for about 7 days.  Recent IV drug abuse about 3 days prior to hospitalization.  She was initially evaluated at [facility] then transferred to [facility] for further management.   Her vitals at the time of transfer include a blood pressure of 131/79, heart rate 124, respiratory rate 46, temperature 98.8, oxygen saturation 96%.  Heart S1-S2, present, tachycardic, lungs with scattered rhonchi bilaterally, increased work of breathing, abdomen soft non tender, positive track marks bilateral upper extremities and left lower extremity.  No edema.  She was diagnosed with sepsis and was admitted to the intensive care unit.  Her blood cultures were positive for MRSA, further work-up revealed tricuspid valve endocarditis.  She was found to have septic emboli to her lungs and kidneys.  During her hospitalization she was found to have right shoulder septic arthritis, underwent I&D 3/5.  Note patient has a PICC line which was placed 2/28, plan to continue antibiotic therapy with daptomycin till 01/19/2021.  Pending final decision about angio VAC debridement per CT surgery.   Right shoulder drain has been removed on 3.10. to remove sutures on until 03/17.     Assessment & Plan:   Principal  Problem:   Endocarditis of tricuspid valve Active Problems:   Severe sepsis due to methicillin resistant Staphylococcus aureus (MRSA) with acute organ dysfunction (HCC)   Poor dentition   Malnutrition (HCC)   Thrombocytopenia (HCC)   Tobacco dependence   Acute cystitis without hematuria   Malnutrition of moderate degree   Abdominal pain   Left foot pain   Septic embolism (HCC)   Staphylococcal arthritis of right shoulder (HCC)   MRSA bacteremia   Acute on chronic respiratory failure with hypoxia (HCC)   Sepsis (HCC)  1 tricuspid valve MRSA endocarditis, complicated with bacteremia (disseminated MRSA infection) and severe sepsis.  Right shoulder septic arthritis and right subdeltoid abscess, POA/positive emboli septic to lungs and kidneys -Secondary to IV drug use  -Improving slowly.  -Patient has been reassessed by CT surgery, and noted to have vegetations on the tricuspid valve and not ideal for angio VAC debridement at this time.  Patient with severe TR. -Per CT surgery patient will be reassessed for valve replacement once she has completed rehab. -ID following and recommending continuation of IV daptomycin. -ID spoke with patient and patient noted to have told ID she did not think she could stay in the hospital for 6 weeks for IV antibiotics postop. -ID recommended antibiotics for 6 weeks from the date of negative blood cultures due to concerns of endocarditis will be treated suboptimally if less. -Per IDs calculation patient should be done with 6 weeks of therapy on January 01, 2021. -Per ID on March 31 if patient does not want to stay any further she could be changed to oral antibiotics/oral therapy to complete at least 8  weeks of postop antibiotics for her shoulder. -Appreciate ID input and recommendations.  2.  Acute hypoxemic respiratory failure Secondary to bilateral septic emboli. -Improved clinically. -On room air with sats of 98%. -Continue IV daptomycin -Pulmonary  toilet.  3.  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 -Continue clonidine twice daily, lorazepam, thiamine.   4.  AKI with ATN/hypokalemia/hypomagnesemia -Renal function improved.   -Urine output not properly recorded.   -Electrolytes repleted.  5.  Acute postop blood loss anemia/iron deficiency anemia Right shoulder drain has been removed. -Hemoglobin trickled down to 6.8.   -Status post transfusion 2 units packed red blood cells 12/19/2020.   -Status post IV Feraheme (12/22/2020). -Hemoglobin improved currently at 10.6.  -Transfusion threshold hemoglobin < 7. -Follow H&H.   6.  Moderate protein calorie malnutrition -Nutritional supplementation.    7.  Hypertension Controlled on current regimen of clonidine, Norvasc.   8.  Depression/anxiety Stable.  Continue Seroquel.   9.  Migraine headache Continue Excedrin Migraine as needed.   10.  Debility/deconditioning PT/OT.    DVT prophylaxis: Ambulating/SCDs Code Status: Full Family Communication: Updated patient.  No family at bedside Disposition:   Status is: Inpatient    Dispo: The patient is from: Home              Anticipated d/c is to: SNF versus home with home health              Patient currently on IV daptomycin, will need to stay hospitalized until completion of IV antibiotics.  Not safe to be discharged home on IV antibiotics due to history of IV drug abuse.  Not stable for discharge.   Difficult to place patient yes       Consultants:   ID: Dr. Drue Second 11/16/2020  PCCM: Dr. Chestine Spore 11/17/2020  Orthopedics: Dr. Susa Simmonds 11/17/2020,/12/06/2020  CT surgery: Dr. Cliffton Asters 11/19/2020  Nephrology: Dr. Melanee Spry 11/21/2020    Procedures:   2D echo 11/17/2020, 12/15/2020  Lower  extremity Dopplers 11/17/2020  Left lower extremity soft tissue ultrasound 11/27/2020  Chest x-ray 12/10/2020, 11/25/2020, 11/24/2020, 11/23/2020, 11/20/2020, 11/17/2020, 11/15/2020  Abdominal films 12/01/2020  CT head 11/15/2020  CT angiogram chest 11/15/2020  CT abdomen and pelvis 11/15/2020  Irrigation and debridement right shoulder abscess 12/06/2020  PICC line 12/01/2020  Transfusion 2 units packed red blood cells 12/19/2020  Antimicrobials:  Anti-infectives (From admission, onward)   Start     Dose/Rate Route Frequency Ordered Stop   12/22/20 0000  linezolid (ZYVOX) 600 MG tablet        600 mg Oral 2 times daily 12/22/20 0830     12/19/20 2000  DAPTOmycin (CUBICIN) 650 mg in sodium chloride 0.9 % IVPB        650 mg 226 mL/hr over 30 Minutes Intravenous Daily 12/19/20 0900 01/19/21 2359   12/12/20 2000  DAPTOmycin (CUBICIN) 750 mg in sodium chloride 0.9 % IVPB  Status:  Discontinued        750 mg 230 mL/hr over 30 Minutes Intravenous Daily 12/12/20 0719 12/19/20 0900   12/09/20 1200  anidulafungin (ERAXIS) 100 mg in sodium chloride 0.9 % 100 mL IVPB  Status:  Discontinued        100 mg 78 mL/hr over 100 Minutes Intravenous Every 24 hours  12/08/20 1109 12/09/20 1013   12/08/20 1200  anidulafungin (ERAXIS) 200 mg in sodium chloride 0.9 % 200 mL IVPB        200 mg 78 mL/hr over 200 Minutes Intravenous  Once 12/08/20 1109 12/08/20 1724   12/06/20 0833  vancomycin (VANCOCIN) powder  Status:  Discontinued          As needed 12/06/20 0834 12/06/20 0852   12/06/20 0600  ceFAZolin (ANCEF) IVPB 2g/100 mL premix        2 g 200 mL/hr over 30 Minutes Intravenous On call to O.R. 12/06/20 0306 12/06/20 0755   11/28/20 2000  DAPTOmycin (CUBICIN) 764 mg in sodium chloride 0.9 % IVPB  Status:  Discontinued        10 mg/kg  76.4 kg 230.6 mL/hr over 30 Minutes Intravenous Daily 11/28/20 0727 12/12/20 0719   11/24/20 2000  DAPTOmycin (CUBICIN) 827 mg in sodium chloride 0.9 % IVPB  Status:  Discontinued         10 mg/kg  82.7 kg 233.1 mL/hr over 30 Minutes Intravenous Daily 11/24/20 0821 11/28/20 0727   11/19/20 1000  DAPTOmycin (CUBICIN) 770 mg in sodium chloride 0.9 % IVPB  Status:  Discontinued        10 mg/kg  77 kg 230.8 mL/hr over 30 Minutes Intravenous Daily 11/19/20 0908 11/24/20 0821   11/19/20 1000  doxycycline (VIBRA-TABS) tablet 100 mg        100 mg Oral Every 12 hours 11/19/20 0908 11/23/20 2123   11/19/20 0439  vancomycin variable dose per unstable renal function (pharmacist dosing)  Status:  Discontinued         Does not apply See admin instructions 11/19/20 0439 11/19/20 0853   11/16/20 2000  vancomycin (VANCOREADY) IVPB 1250 mg/250 mL  Status:  Discontinued        1,250 mg 166.7 mL/hr over 90 Minutes Intravenous Every 24 hours 11/15/20 1933 11/16/20 1108   11/16/20 1200  vancomycin (VANCOCIN) IVPB 1000 mg/200 mL premix  Status:  Discontinued        1,000 mg 200 mL/hr over 60 Minutes Intravenous Every 12 hours 11/16/20 1108 11/19/20 0439   11/16/20 0745  metroNIDAZOLE (FLAGYL) IVPB 500 mg        500 mg 100 mL/hr over 60 Minutes Intravenous  Once 11/16/20 0741 11/16/20 0907   11/15/20 2237  vancomycin (VANCOCIN) 500 MG powder       Note to Pharmacy: Ihor Dow   : cabinet override      11/15/20 2237 11/16/20 1044   11/15/20 2000  ceFEPIme (MAXIPIME) 2 g in sodium chloride 0.9 % 100 mL IVPB  Status:  Discontinued        2 g 200 mL/hr over 30 Minutes Intravenous Every 12 hours 11/15/20 1933 11/16/20 1212   11/15/20 1945  vancomycin (VANCOREADY) IVPB 1500 mg/300 mL  Status:  Discontinued        1,500 mg 150 mL/hr over 120 Minutes Intravenous  Once 11/15/20 1933 11/15/20 1939   11/15/20 1945  vancomycin (VANCOCIN) IVPB 1000 mg/200 mL premix       "And" Linked Group Details   1,000 mg 200 mL/hr over 60 Minutes Intravenous  Once 11/15/20 1939 11/15/20 2231   11/15/20 1945  vancomycin (VANCOREADY) IVPB 500 mg/100 mL       "And" Linked Group Details   500 mg 100 mL/hr  over 60 Minutes Intravenous  Once 11/15/20 1939 11/16/20 0002       Subjective: Laying in bed.  Denies any chest pain.  No shortness of breath.  Feeling better.  Right shoulder/arm pain improving.  Is in agreement to wean methadone.   Objective: Vitals:   12/22/20 2030 12/22/20 2339 12/23/20 0411 12/23/20 0819  BP: 113/63 107/66 118/69 127/76  Pulse: (!) 102 86 88 89  Resp: 18 18 19 18   Temp: 100.1 F (37.8 C) 97.9 F (36.6 C) 97.9 F (36.6 C) 98.4 F (36.9 C)  TempSrc: Oral Oral Oral Oral  SpO2: 97% 98% 100% 98%  Weight:      Height:        Intake/Output Summary (Last 24 hours) at 12/23/2020 1004 Last data filed at 12/22/2020 2200 Gross per 24 hour  Intake 233.44 ml  Output -  Net 233.44 ml   Filed Weights   12/01/20 0328 12/02/20 0300 12/16/20 2100  Weight: 74 kg 75.2 kg 66.6 kg    Examination:  General exam: : NAD Respiratory system: CTA B anterior lung fields.  No wheezes, no rhonchi.  Speaking in full sentences.  Normal respiratory effort. Cardiovascular system: Regular rate and rhythm with 3/6 SEM left sternal border.  No JVD.  No lower extremity edema. Gastrointestinal system: Abdomen soft, nontender, nondistended, positive bowel sounds.  No rebound.  No guarding. Central nervous system: Alert and oriented. No focal neurological deficits. Extremities: Symmetric 5 x 5 power. Skin: No rashes, lesions or ulcers Psychiatry: Judgement and insight appear normal. Mood & affect appropriate..    Data Reviewed: I have personally reviewed following labs and imaging studies  CBC: Recent Labs  Lab 12/17/20 1102 12/18/20 0656 12/19/20 0821 12/20/20 0223 12/23/20 0518  WBC 11.0* 10.1 9.2 10.4 7.6  NEUTROABS 6.6 5.8 5.1  --   --   HGB 7.2* 7.4* 6.8* 8.6* 10.6*  HCT 23.0* 23.6* 21.5* 25.7* 32.9*  MCV 84.6 84.6 84.0 81.8 83.9  PLT 311 306 306 300 247    Basic Metabolic Panel: Recent Labs  Lab 12/17/20 1102 12/18/20 0656 12/19/20 0821 12/20/20 0223  12/21/20 0500 12/23/20 0518  NA 135 135 134* 133* 130* 136  K 3.7 3.7 3.7 3.5 3.9 3.7  CL 103 101 102 99 100 103  CO2 25 25 24 24 24 24   GLUCOSE 101* 101* 94 97 96 98  BUN 11 13 15 14 14 15   CREATININE 1.23* 1.28* 1.32* 1.33* 1.31* 1.26*  CALCIUM 8.4* 8.5* 8.4* 8.4* 8.8* 9.1  MG 1.9 2.1  --  1.9  --   --   PHOS  --  5.1*  --   --   --   --     GFR: Estimated Creatinine Clearance: 60 mL/min (A) (by C-G formula based on SCr of 1.26 mg/dL (H)).  Liver Function Tests: Recent Labs  Lab 12/18/20 0656  ALBUMIN 1.9*    CBG: No results for input(s): GLUCAP in the last 168 hours.   No results found for this or any previous visit (from the past 240 hour(s)).       Radiology Studies: No results found.      Scheduled Meds: . sodium chloride   Intravenous Once  . amLODipine  10 mg Oral Daily  . chlorhexidine  15 mL Mouth Rinse BID  . Chlorhexidine Gluconate Cloth  6 each Topical Daily  . cloNIDine  0.1 mg Oral BID  . dicyclomine  20 mg Oral TID AC & HS  . docusate sodium  100 mg Oral BID  . feeding supplement  1 Container Oral BID BM  . feeding supplement  237 mL Oral BID BM  . folic acid  1 mg Oral Daily  . mouth rinse  15 mL Mouth Rinse q12n4p  . methadone  10 mg Oral Q12H  . multivitamin with minerals  1 tablet Oral Daily  . pantoprazole  40 mg Oral Daily  . polyethylene glycol  17 g Oral BID  . QUEtiapine  25 mg Oral BID  . senna  2 tablet Oral Daily  . sodium chloride flush  10-40 mL Intracatheter Q12H  . thiamine  100 mg Oral Daily   Continuous Infusions: . sodium chloride Stopped (12/21/20 2338)  . DAPTOmycin (CUBICIN)  IV 650 mg (12/22/20 2014)  . lactated ringers 10 mL/hr at 12/17/20 2351     LOS: 37 days    Time spent: 35 minutes    Ramiro Harvestaniel Thompson, MD [facility]   To contact the attending provider between 7A-7P or the covering provider during after hours 7P-7A, please log into the web site [email or web address] and access using universal  [place] password for that web site. If you do not have the password, please call the hospital operator.  12/23/2020, 10:04 AM

## 2020-12-23 NOTE — Progress Notes (Signed)
Physical Therapy Treatment and Discharge Patient Details Name: Anne Bautista MRN: [id number] DOB: 09-03-1986 Today's Date: 12/23/2020    History of Present Illness Pt is a 35 y/o female admitted 2/14 with sepsis due to MRSA bacteremia in setting of IVDU. R shoulder pain s/p aspiration 2/14. Underwent I&D R shoulder 3/5. Pt had multiple subdeltoid abscesses. PMHx: IVDU, malnutrition, poor dentition.    PT Comments    Pt independent in room and ambulating to the nurses station without assistance per unit staff. HEP provided on 3/18 and pt reports performing independently in room between therapy sessions. Endurance is mildly decreased however pt still able to ambulate ~550' without an AD or DOE this session. Encouraged ambulation in room once an hour and a longer walk for exercise 2-3 times a day. Pt agreeable to physical therapy signing off at this time as acute PT goals are met. If needs change, please reconsult.    Follow Up Recommendations  No PT follow up     Equipment Recommendations  None recommended by PT    Recommendations for Other Services OT consult     Precautions / Restrictions Precautions Precautions: Fall Precaution Comments: right shoulder, B ankles Restrictions Weight Bearing Restrictions: No    Mobility  Bed Mobility Overal bed mobility: Independent             General bed mobility comments: No difficulty to manage any aspect of bed mobility    Transfers Overall transfer level: Independent Equipment used: None Transfers: Sit to/from [employer] transfer comment: Min use of UE's for support. No unsteadiness or LOB noted.  Ambulation/Gait Ambulation/Gait assistance: Independent Gait Distance (Feet): 550 Feet Assistive device: None Gait Pattern/deviations: WFL(Within Functional Limits) Gait velocity: Decreased Gait velocity interpretation: 1.31 - 2.62 ft/sec, indicative of limited community ambulator General Gait Details: Mildly  antalgic but overall WFL. Pt reports bilateral ankle pain which sounds as though it is a chronic complaint.   Stairs             Wheelchair Mobility    Modified Rankin (Stroke Patients Only)       Balance Overall balance assessment: Needs assistance Sitting-balance support: No upper extremity supported;Feet supported Sitting balance-Leahy Scale: Normal     Standing balance support: No upper extremity supported;During functional activity Standing balance-Leahy Scale: Good Standing balance comment: standing reaching activities with no LOB and able to bring self back to midline                            Cognition Arousal/Alertness: Awake/alert Behavior During Therapy: Flat affect Overall Cognitive Status: Within Functional Limits for tasks assessed                                 General Comments: Self limiting behavior noted requiring increased encouragement to participate      Exercises Other Exercises Other Exercises: Verbally reviewed printed HEP. Pt reports understanding and states she has been performing in her room between therapy sessions.    General Comments        Pertinent Vitals/Pain Pain Assessment: Faces Faces Pain Scale: Hurts a little bit Pain Location: B ankles with ambulation Pain Descriptors / Indicators: Discomfort;Sore Pain Intervention(s): Limited activity within patient's tolerance;Repositioned;Monitored during session    Home Living  Prior Function            PT Goals (current goals can now be found in the care plan section) Acute Rehab PT Goals Patient Stated Goal: to go home PT Goal Formulation: With patient Time For Goal Achievement: 12/30/20 Potential to Achieve Goals: Good Progress towards PT goals: Goals met/education completed, patient discharged from PT    Frequency    Min 3X/week      PT Plan Discharge plan needs to be updated    Co-evaluation               AM-PAC PT "6 Clicks" Mobility   Outcome Measure  Help needed turning from your back to your side while in a flat bed without using bedrails?: None Help needed moving from lying on your back to sitting on the side of a flat bed without using bedrails?: None Help needed moving to and from a bed to a chair (including a wheelchair)?: None Help needed standing up from a chair using your arms (e.g., wheelchair or bedside chair)?: None Help needed to walk in hospital room?: A Little Help needed climbing 3-5 steps with a railing? : A Little 6 Click Score: 22    End of Session Equipment Utilized During Treatment: Gait belt Activity Tolerance: Patient tolerated treatment well Patient left: in bed;with call bell/phone within reach;with family/visitor present Nurse Communication: Mobility status PT Visit Diagnosis: Other abnormalities of gait and mobility (R26.89) Pain - Right/Left: Right Pain - part of body: Shoulder;Ankle and joints of foot     Time: 0762-2633 PT Time Calculation (min) (ACUTE ONLY): 16 min  Charges:  $Gait Training: 8-22 mins                     Rolinda Roan, PT, DPT Acute Rehabilitation Services Pager: [phone] Office: [phone]    Anne Bautista 12/23/2020, 2:07 PM

## 2020-12-24 MED ORDER — SENNOSIDES-DOCUSATE SODIUM 8.6-50 MG PO TABS
1.0000 | ORAL_TABLET | Freq: Every day | ORAL | Status: DC
  Administered 2020-12-24: 1 via ORAL
  Filled 2020-12-24 (×3): qty 1

## 2020-12-24 MED ORDER — FERROUS SULFATE 325 (65 FE) MG PO TABS
325.0000 mg | ORAL_TABLET | Freq: Every day | ORAL | Status: DC
  Administered 2020-12-25 – 2020-12-29 (×5): 325 mg via ORAL
  Filled 2020-12-24 (×5): qty 1

## 2020-12-24 NOTE — Progress Notes (Signed)
[facility] PROGRESS NOTE  Anne Bautista JXB:[id number] DOB: 03/17/1986 DOA: 11/15/2020 PCP: Patient, No Pcp Per  Status:  Remains inpatient appropriate because:Unsafe d/c plan and IV treatments appropriate due to intensity of illness or inability to take PO   Dispo: The patient is from: Home              Anticipated d/c is to: SNF recommended by PT due to physical debility              Barriers to discharge: Lack of funding-currently Medicaid potential, history of substance abuse.  Patient can discharge on 3/31 after receiving IV dose of oritavancin followed by several more weeks of oral linezolid if she can afford this medication.  Of note prior to admission patient was residing at the [facility] [address].  **As of 3/21 patient now states she will be returning to the [facility] and she states her mother will be staying there with her.  Offered to contact patient's mother to update her on plans regarding procurement of medications and outpatient appointments but patient declined.**              Patient currently is not medically stable to d/c.  Patient will remain on IV daptomycin until 4/18 although she can discharge on 3/31 if able to afford oral linezolid.  As of 3/21 we have confirmed with TOC that out of pocket fee would be around 75 hours.  CM notified and plan is to provide MATCH to pay 100% for this prescription.   Difficult to place patient No   Level of care: Med-Surg  Code Status: Full Family Communication: Patient only DVT prophylaxis: SQ Heparin Vaccination status: Unknown  Foley catheter: No  HPI: 35 year old female with history of IV drug use, tobacco use, depression came to the hospital with myalgias and cough, he was septic on arrival admitted to the ICU.  She was found to have MRSA bacteremia, tricuspid valve endocarditis as well as septic emboli to lungs, kidneys.  Infectious disease and cardiothoracic surgery consulted.  Hospital course complicated by  worsening right shoulder pain found to have septic arthritis, with cultures showing yeast   Subjective: No complaints.  Updated patient that due to tapering schedule she would likely discharge home with a few days of methadone and then be able to completely stop.  She was agreeable to this plan.  Objective: Vitals:   12/23/20 2342 12/24/20 0327  BP: 127/68 116/73  Pulse: 94 89  Resp:  17  Temp: 99.4 F (37.4 C) 98.7 F (37.1 C)  SpO2: 97% 96%    Intake/Output Summary (Last 24 hours) at 12/24/2020 0827 Last data filed at 12/23/2020 2159 Gross per 24 hour  Intake 127.44 ml  Output --  Net 127.44 ml   Filed Weights   12/01/20 0328 12/02/20 0300 12/16/20 2100  Weight: 74 kg 75.2 kg 66.6 kg    Exam:  Constitutional: Alert, calm, no acute distress Respiratory: Lung sounds are clear and she is stable on room air. Cardiovascular: Pulses nontachycardic at rest and she is normotensive.  Skin warm and dry grade 3/5 systolic murmur left sternal border fourth intercostal space.   Abdomen: LBM 3/21, soft and nontender, bowel sounds are present Musculoskeletal: Surgical drain and sutures have been removed from right shoulder region just above Neurologic: CN 2-12 grossly intact.  No focal neurological deficits.  Ambulatory without difficulty Psychiatric: Alert and oriented x3.  Appropriate affect.   Assessment/Plan: Acute problems: Severe sepsis due to  MRSA bacteremia/ -Complicated by septic emboli to both lungs and kidneys, right shoulder possibly septic left ankle (MRI refused by patient).  She also has a right subdeltoid abscess. Left ankle improving, no further issues, no pain -ID, cardiothoracic surgery, orthopedic surgery following -Status post incision and drainage of right shoulder subdeltoid abscess with multiple cavities on 3/5 by Dr. Lucia Gaskins. Drain dc'd by ortho -Continue daptomycin until date of discharge.  Plan discharge date is  3/31 to complete 2 weeks of oral linezolid.    -CK has remained stable on daptomycin  -Last blood cultures negative on 12/10/2020, PICC line placed 2/28.  Tricuspid valve endocarditis -Cardiothoracic surgery evaluated patient early in the hospitalization for possible angio VAC debridement.  -Echocardiogram on 3/14 revealed persistent tricuspid valve endocarditis but due to location not amenable to angio vac.  Dr. Kipp Brood recommends following up in the outpatient setting once patient "clean" to discuss surgical intervention.  She is hemodynamically stable without any evidence of acute heart failure. -Recent chest x-ray unremarkable no evidence of heart failure.  BNP stable in the 450 range  Acute blood loss anemia, iron deficiency anemia  with episode of symptomatic anemia -post operatively, received a total of 2U pRBC, one on 3/6 and one on 3/7 -Hemoglobin had drifted downward to 6.8 on 3/18 and patient was given 2 units of PRBCs with improvement of hemoglobin to 8.6 -Given IV Iron on 3/21 per attending.  Hemoglobin on 3/22 has increased from 8.6-10.6.  Creatinine stable at 1.26.  Sodium stable at 136 -Begin daily oral iron replacement with Senokot at bedtime.  Septic emboli to left ankle and right shoulder -Status post drainage of right subdeltoid abscess -Drain removed by orthopedic team on 3/10 with documentation that sutures to remain in place for an additional week  Migraine headache -Patient reports recent nausea secondary to migraine holocord of the weekend and normally talks Excedrin Migraine which has been ordered as of 3/14.  Fever -Has been having waxing and waning febrile state throughout the admission without any new sources of infection determined.  IV drug use w/ Heroin, cocaine use, marijuana, tobacco  -cessation was encouraged.   -UDS was positive for cocaine.   -Hep C and HIV are negative.  RPR is negative. -Is on methadone and she would like to continue this after discharge.  Did not wish to transition to Suboxone  after discharge. -3/21 Attending spoke at length w/ patient and plan is to taper Suboxone prior to dc; as of 3/21 dose decr to 10 mg BID; current plan is to taper down every 5 days; next change will be 3/26 to 10 mg daily for an additional 5 days then likely will require 5 mg daily for 3 to 5 days after discharge -Methadone can interact with discharge antibiotic linezolid and cause QTC prolongation therefore patient will need to have EKG obtained prior to discharge -[facility] staff note has given patient contact information for MAT facilities in the area that she is responsible for contacting to arrange for outpatient follow-up  Acute hypoxic respiratory failure, narcotic withdrawal, bilateral pulmonary septic emboli  -hypoxia resolved, remains on room air   Nonoliguric AKI with ATN  -creatinine peaked at 2.8, fluctuating but overall stable, 1.4 today   Debility/deconditioning PT note 3/22 Pt independent in room and ambulating to the nurses station without assistance per unit staff. HEP provided on 3/18 and pt reports performing independently in room between therapy sessions. Endurance is mildly decreased however pt still able to ambulate ~550' without an AD  or DOE this session. Encouraged ambulation in room once an hour and a longer walk for exercise 2-3 times a day. Pt agreeable to physical therapy signing off at this time as acute PT goals are met. If needs change, please reconsult.   OT NOTE 3/22:     Per pt and staff, pt has been managing ADLs/mobility in room without AD and without assist including toileting, bathing, dressing. Pt denies any significant balance issues during mobility. Session focused on carryover of HEP with self limiting behaviors continually noted. Pt demo 2 UE HEP with 5 reps each and returned back to bed. Pt reports soreness after 10 reps of theraband exercises. Educated pt on alternating resistive exercises with AROM exercises to further progress shoulder ROM/strength within  tolerance, as well as incorporating R UE use during daily tasks as pt is R-hand dominant. Advised pt that continued R shoulder exercise is needed to avoid stiffness and continued pain. Pt verbalized understanding and no further skilled OT services needed at acute level. OT to sign off. Please reconsult if needs change.      Moderate protein calorie malnutrition Nutrition Problem: Moderate Malnutrition Etiology: social / environmental circumstances (IVDU) Signs/Symptoms: mild fat depletion,moderate fat depletion,mild muscle depletion,moderate muscle depletion Interventions: Ensure Enlive (each supplement provides 350kcal and 20 grams of protein),MVI Estimated body mass index is 23 kg/m as calculated from the following:   Height as of this encounter: _0  (1.702 m).   Weight as of this encounter: 66.6 kg.   Wounds: Incision (Closed) 12/06/20 Shoulder Right (Active)  Date First Assessed/Time First Assessed: 12/06/20 0914   Location: Shoulder  Location Orientation: Right    Assessments 12/06/2020  8:56 AM 12/23/2020  7:35 PM  Dressing Type Adhesive bandage None  Dressing Clean;Dry;Intact Clean;Dry;Intact  Site / Wound Assessment Dressing in place / Unable to assess Clean;Dry  Margins -- Attached edges (approximated)  Closure -- Skin glue  Drainage Amount None None     No Linked orders to display     Other problems: Hypertension -continue current regimen  Acute cystitis without hematuria  -MRSA in cultures, she is on antibiotics  Data Reviewed: Basic Metabolic Panel: Recent Labs  Lab 12/17/20 1102 12/18/20 0656 12/19/20 0821 12/20/20 0223 12/21/20 0500 12/23/20 0518  NA 135 135 134* 133* 130* 136  K 3.7 3.7 3.7 3.5 3.9 3.7  CL 103 101 102 99 100 103  CO2 _1 GLUCOSE 101* 101* 94 97 96 98  BUN _2 CREATININE 1.23* 1.28* 1.32* 1.33* 1.31* 1.26*  CALCIUM 8.4* 8.5* 8.4* 8.4* 8.8* 9.1  MG 1.9 2.1  --  1.9  --   --   PHOS  --  5.1*  --   --    --   --    Liver Function Tests: Recent Labs  Lab 12/18/20 0656  ALBUMIN 1.9*   No results for input(s): LIPASE, AMYLASE in the last 168 hours. No results for input(s): AMMONIA in the last 168 hours. CBC: Recent Labs  Lab 12/17/20 1102 12/18/20 0656 12/19/20 0821 12/20/20 0223 12/23/20 0518  WBC 11.0* 10.1 9.2 10.4 7.6  NEUTROABS 6.6 5.8 5.1  --   --   HGB 7.2* 7.4* 6.8* 8.6* 10.6*  HCT 23.0* 23.6* 21.5* 25.7* 32.9*  MCV 84.6 84.6 84.0 81.8 83.9  PLT 311 306 306 300 247   Cardiac Enzymes: Recent Labs  Lab 12/22/20 0500  CKTOTAL 11*   BNP (last 3 results)  Recent Labs    11/27/20 0430 11/28/20 0505 12/10/20 1600  BNP 670.1* 455.5* 458.5*    ProBNP (last 3 results) No results for input(s): PROBNP in the last 8760 hours.  CBG: No results for input(s): GLUCAP in the last 168 hours.  No results found for this or any previous visit (from the past 240 hour(s)).   Studies: No results found.  Scheduled Meds: . sodium chloride   Intravenous Once  . amLODipine  10 mg Oral Daily  . chlorhexidine  15 mL Mouth Rinse BID  . Chlorhexidine Gluconate Cloth  6 each Topical Daily  . cloNIDine  0.1 mg Oral BID  . dicyclomine  20 mg Oral TID AC & HS  . docusate sodium  100 mg Oral BID  . feeding supplement  1 Container Oral BID BM  . feeding supplement  237 mL Oral BID BM  . folic acid  1 mg Oral Daily  . mouth rinse  15 mL Mouth Rinse q12n4p  . methadone  10 mg Oral Q12H  . multivitamin with minerals  1 tablet Oral Daily  . pantoprazole  40 mg Oral Daily  . polyethylene glycol  17 g Oral BID  . QUEtiapine  25 mg Oral BID  . senna  2 tablet Oral Daily  . sodium chloride flush  10-40 mL Intracatheter Q12H  . thiamine  100 mg Oral Daily   Continuous Infusions: . sodium chloride Stopped (12/23/20 2259)  . DAPTOmycin (CUBICIN)  IV Stopped (12/23/20 2259)  . lactated ringers 10 mL/hr at 12/17/20 2351    Principal Problem:   Endocarditis of tricuspid valve Active  Problems:   Severe sepsis due to methicillin resistant Staphylococcus aureus (MRSA) with acute organ dysfunction (HCC)   Poor dentition   Malnutrition (HCC)   Thrombocytopenia (HCC)   Tobacco dependence   Acute cystitis without hematuria   Malnutrition of moderate degree   Abdominal pain   Left foot pain   Septic embolism (HCC)   Staphylococcal arthritis of right shoulder (HCC)   MRSA bacteremia   Acute on chronic respiratory failure with hypoxia (HCC)   Sepsis ([place])   Iron deficiency anemia   Consultants: Infectious disease Cardiothoracic surgery Orthopedic surgery  Procedures:  2D echocardiogram  Anesthesia on 3/3  Irrigation and debridement right shoulder abscess on 3/5  Antibiotics: Anti-infectives (From admission, onward)   Start     Dose/Rate Route Frequency Ordered Stop   12/22/20 0000  linezolid (ZYVOX) 600 MG tablet        600 mg Oral 2 times daily 12/22/20 0830     12/19/20 2000  DAPTOmycin (CUBICIN) 650 mg in sodium chloride 0.9 % IVPB        650 mg 226 mL/hr over 30 Minutes Intravenous Daily 12/19/20 0900 01/19/21 2359   12/12/20 2000  DAPTOmycin (CUBICIN) 750 mg in sodium chloride 0.9 % IVPB  Status:  Discontinued        750 mg 230 mL/hr over 30 Minutes Intravenous Daily 12/12/20 0719 12/19/20 0900   12/09/20 1200  anidulafungin (ERAXIS) 100 mg in sodium chloride 0.9 % 100 mL IVPB  Status:  Discontinued        100 mg 78 mL/hr over 100 Minutes Intravenous Every 24 hours 12/08/20 1109 12/09/20 1013   12/08/20 1200  anidulafungin (ERAXIS) 200 mg in sodium chloride 0.9 % 200 mL IVPB        200 mg 78 mL/hr over 200 Minutes Intravenous  Once 12/08/20 1109 12/08/20 1724   12/06/20  2878  vancomycin (VANCOCIN) powder  Status:  Discontinued          As needed 12/06/20 0834 12/06/20 0852   12/06/20 0600  ceFAZolin (ANCEF) IVPB 2g/100 mL premix        2 g 200 mL/hr over 30 Minutes Intravenous On call to O.R. 12/06/20 0306 12/06/20 0755   11/28/20 2000  DAPTOmycin  (CUBICIN) 764 mg in sodium chloride 0.9 % IVPB  Status:  Discontinued        10 mg/kg  76.4 kg 230.6 mL/hr over 30 Minutes Intravenous Daily 11/28/20 0727 12/12/20 0719   11/24/20 2000  DAPTOmycin (CUBICIN) 827 mg in sodium chloride 0.9 % IVPB  Status:  Discontinued        10 mg/kg  82.7 kg 233.1 mL/hr over 30 Minutes Intravenous Daily 11/24/20 0821 11/28/20 0727   11/19/20 1000  DAPTOmycin (CUBICIN) 770 mg in sodium chloride 0.9 % IVPB  Status:  Discontinued        10 mg/kg  77 kg 230.8 mL/hr over 30 Minutes Intravenous Daily 11/19/20 0908 11/24/20 0821   11/19/20 1000  doxycycline (VIBRA-TABS) tablet 100 mg        100 mg Oral Every 12 hours 11/19/20 0908 11/23/20 2123   11/19/20 0439  vancomycin variable dose per unstable renal function (pharmacist dosing)  Status:  Discontinued         Does not apply See admin instructions 11/19/20 0439 11/19/20 0853   11/16/20 2000  vancomycin (VANCOREADY) IVPB 1250 mg/250 mL  Status:  Discontinued        1,250 mg 166.7 mL/hr over 90 Minutes Intravenous Every 24 hours 11/15/20 1933 11/16/20 1108   11/16/20 1200  vancomycin (VANCOCIN) IVPB 1000 mg/200 mL premix  Status:  Discontinued        1,000 mg 200 mL/hr over 60 Minutes Intravenous Every 12 hours 11/16/20 1108 11/19/20 0439   11/16/20 0745  metroNIDAZOLE (FLAGYL) IVPB 500 mg        500 mg 100 mL/hr over 60 Minutes Intravenous  Once 11/16/20 0741 11/16/20 0907   11/15/20 2237  vancomycin (VANCOCIN) 500 MG powder       Note to Pharmacy: Marinus Maw   : cabinet override      11/15/20 2237 11/16/20 1044   11/15/20 2000  ceFEPIme (MAXIPIME) 2 g in sodium chloride 0.9 % 100 mL IVPB  Status:  Discontinued        2 g 200 mL/hr over 30 Minutes Intravenous Every 12 hours 11/15/20 1933 11/16/20 1212   11/15/20 1945  vancomycin (VANCOREADY) IVPB 1500 mg/300 mL  Status:  Discontinued        1,500 mg 150 mL/hr over 120 Minutes Intravenous  Once 11/15/20 1933 11/15/20 1939   11/15/20 1945  vancomycin  (VANCOCIN) IVPB 1000 mg/200 mL premix       "And" Linked Group Details   1,000 mg 200 mL/hr over 60 Minutes Intravenous  Once 11/15/20 1939 11/15/20 2231   11/15/20 1945  vancomycin (VANCOREADY) IVPB 500 mg/100 mL       "And" Linked Group Details   500 mg 100 mL/hr over 60 Minutes Intravenous  Once 11/15/20 1939 11/16/20 0002       Time spent: 20 minutes    Erin Hearing ANP  [facility] 7 am - 330 pm/M-F for direct patient care and secure chat Please refer to Amion for contact info 38  days

## 2020-12-25 ENCOUNTER — Inpatient Hospital Stay: Payer: Self-pay | Admitting: Physician Assistant

## 2020-12-25 MED ORDER — IBUPROFEN 200 MG PO TABS: 600.0000 mg | ORAL_TABLET | Freq: Three times a day (TID) | ORAL | Status: DC

## 2020-12-25 MED ORDER — IBUPROFEN 200 MG PO TABS
400.0000 mg | ORAL_TABLET | Freq: Two times a day (BID) | ORAL | Status: DC
  Administered 2020-12-25 – 2020-12-28 (×7): 400 mg via ORAL
  Filled 2020-12-25 (×7): qty 2

## 2020-12-25 MED ORDER — BOOST / RESOURCE BREEZE PO LIQD CUSTOM
1.0000 | Freq: Three times a day (TID) | ORAL | Status: DC
  Administered 2020-12-26 – 2020-12-28 (×5): 1 via ORAL

## 2020-12-25 MED ORDER — DICLOFENAC SODIUM 1 % EX GEL
4.0000 g | Freq: Four times a day (QID) | CUTANEOUS | Status: DC
  Administered 2020-12-25 – 2020-12-27 (×5): 4 g via TOPICAL
  Filled 2020-12-25: qty 100

## 2020-12-25 NOTE — Progress Notes (Signed)
Consult from nurse reporting the unit had no supplies for PICC dressing change. Supplies sent. RN made aware.

## 2020-12-25 NOTE — Progress Notes (Signed)
Go in to start PICC dressing change for patient and she explains how she "wants to be more awake" and wants the day shift RN to do change the PICC dressing since she is still attempting to sleep. I explained the reasoning for the PICC change and patient strongly suggested to wait until later in the morning.   Charge RN Philomena was made aware.

## 2020-12-25 NOTE — Progress Notes (Signed)
[facility] PROGRESS NOTE  Anne Bautista VZD:[id number] DOB: [birth year] DOA: 11/15/2020 PCP: Patient, No Pcp Per  Status:  Remains inpatient appropriate because:Unsafe d/c plan and IV treatments appropriate due to intensity of illness or inability to take PO   Dispo: The patient is from: Home              Anticipated d/c is to: SNF recommended by PT due to physical debility              Barriers to discharge: Lack of funding-currently Medicaid potential, history of substance abuse.  Patient can discharge on 3/31 after receiving IV dose of oritavancin followed by several more weeks of oral linezolid if she can afford this medication.  Of note prior to admission patient was residing at the [facility] [address].  **As of 3/21 patient now states she will be returning to the [facility] and she states her mother will be staying there with her.  Offered to contact patient's mother to update her on plans regarding procurement of medications and outpatient appointments but patient declined.**              Patient currently is not medically stable to d/c.  Patient will remain on IV daptomycin until 4/18 although she can discharge on 3/31 if able to afford oral linezolid.  As of 3/21 we have confirmed with TOC that out of pocket fee would be around 75 hours.  CM notified and plan is to provide MATCH to pay 100% for this prescription.   Difficult to place patient No   Level of care: Med-Surg  Code Status: Full Family Communication: Patient only DVT prophylaxis: SQ Heparin Vaccination status: Unknown  Foley catheter: No  HPI: 35 year old female with history of IV drug use, tobacco use, depression came to the hospital with myalgias and cough, he was septic on arrival admitted to the ICU.  She was found to have MRSA bacteremia, tricuspid valve endocarditis as well as septic emboli to lungs, kidneys.  Infectious disease and cardiothoracic surgery consulted.  Hospital course complicated by  worsening right shoulder pain found to have septic arthritis, with cultures showing yeast   Subjective: Awakened.  Continues to have some shoulder discomfort as methadone is weaned but patient repeatedly states wants to be weaned off of methadone and does not want to take anymore.  States has not had problems with NSAIDs in the past.  Objective: Vitals:   12/24/20 2338 12/25/20 0340  BP: (!) 98/59 118/65  Pulse: 89 83  Resp: 16 16  Temp: 99.2 F (37.3 C) 98.1 F (36.7 C)  SpO2: 98% 97%    Intake/Output Summary (Last 24 hours) at 12/25/2020 0809 Last data filed at 12/25/2020 0000 Gross per 24 hour  Intake 123 ml  Output --  Net 123 ml   Filed Weights   12/01/20 0328 12/02/20 0300 12/16/20 2100  Weight: 74 kg 75.2 kg 66.6 kg    Exam:  Constitutional: Awakens, no acute distress Respiratory: Lungs clear to auscultation stable on room air Heart: No tachycardia normotensive grade 3/5 systolic murmur left sternal border fourth intercostal space.   Abdomen: LBM 3/21, tolerating diet well.  Abdomen unremarkable Musculoskeletal: Pain with AROM of right shoulder Neurologic: CN 2-12 grossly intact.  No focal neurological deficits and ambulates independently Psychiatric: Alert and oriented x3.  Affect appropriate and pleasant.   Assessment/Plan: Acute problems: Severe sepsis due to MRSA bacteremia/ -Complicated by septic emboli to both lungs and kidneys, right shoulder possibly  septic left ankle (MRI refused by patient).  She also has a right subdeltoid abscess. Left ankle improving, no further issues, no pain -ID, cardiothoracic surgery, orthopedic surgery following -Status post incision and drainage of right shoulder subdeltoid abscess with multiple cavities on 3/5 by Dr. Lucia Gaskins. Drain dc'd by ortho -Continue daptomycin until date of discharge.  Plan discharge date is  3/31 to complete 2 weeks of oral linezolid.   -CK has remained stable on daptomycin  -Last blood cultures negative  on 12/10/2020, PICC line placed 2/28.  Tricuspid valve endocarditis -Cardiothoracic surgery evaluated patient early in the hospitalization for possible angio VAC debridement.  -Echocardiogram on 3/14 revealed persistent tricuspid valve endocarditis but due to location not amenable to angio vac.  Dr. Kipp Brood recommends following up in the outpatient setting once patient "clean" to discuss surgical intervention.  She is hemodynamically stable without any evidence of acute heart failure. -Recent chest x-ray unremarkable no evidence of heart failure.  BNP stable in the 450 range  Acute blood loss anemia, iron deficiency anemia  with episode of symptomatic anemia -post operatively, received a total of 2U pRBC, one on 3/6 and one on 3/7 -Hemoglobin had drifted downward to 6.8 on 3/18 and patient was given 2 units of PRBCs with improvement of hemoglobin to 8.6 -Given IV Iron on 3/21 per attending.  Hemoglobin on 3/22 has increased from 8.6-10.6.  Creatinine stable at 1.26.  Sodium stable at 136 -Begin daily oral iron replacement with Senokot at bedtime.  Septic emboli to left ankle and right shoulder -Status post drainage of right subdeltoid abscess -Drain removed by orthopedic team on 3/10 with documentation that sutures to remain in place for an additional week -Continues to have pain in the right shoulder.  Encouraged to perform AROM frequently to avoid frozen shoulder -Repeatedly states wishes to know weaned off of methadone therefore I will begin ibuprofen 400 mg scheduled twice daily-using lower dose regimen given mildly abnormal creatinine with a GFR of 53-57 recently -We will also add Voltaren gel 4 times daily to right shoulder  Migraine headache -Patient reports recent nausea secondary to migraine holocord of the weekend and normally talks Excedrin Migraine which has been ordered as of 3/14.  Fever -Has been having waxing and waning febrile state throughout the admission without any new  sources of infection determined.  IV drug use w/ Heroin, cocaine use, marijuana, tobacco  -cessation was encouraged.   -UDS was positive for cocaine.   -Hep C and HIV are negative.  RPR is negative. -Is on methadone and she would like to continue this after discharge.  Did not wish to transition to Suboxone after discharge. -3/21 Attending spoke at length w/ patient and plan is to taper Suboxone prior to dc; as of 3/21 dose decr to 10 mg BID; current plan is to taper down every 5 days; next change will be 3/26 to 10 mg daily for an additional 5 days then likely will require 5 mg daily for 3 to 5 days after discharge -Methadone can interact with discharge antibiotic linezolid and cause QTC prolongation therefore patient will need to have EKG obtained prior to discharge -[facility] staff note has given patient contact information for MAT facilities in the area that she is responsible for contacting to arrange for outpatient follow-up  Acute hypoxic respiratory failure, narcotic withdrawal, bilateral pulmonary septic emboli  -hypoxia resolved, remains on room air   Nonoliguric AKI with ATN  -creatinine peaked at 2.8, fluctuating but overall stable, 1.4 today  Debility/deconditioning PT note 3/22 Pt independent in room and ambulating to the nurses station without assistance per unit staff. HEP provided on 3/18 and pt reports performing independently in room between therapy sessions. Endurance is mildly decreased however pt still able to ambulate ~550' without an AD or DOE this session. Encouraged ambulation in room once an hour and a longer walk for exercise 2-3 times a day. Pt agreeable to physical therapy signing off at this time as acute PT goals are met. If needs change, please reconsult.   OT NOTE 3/22:     Per pt and staff, pt has been managing ADLs/mobility in room without AD and without assist including toileting, bathing, dressing. Pt denies any significant balance issues during mobility.  Session focused on carryover of HEP with self limiting behaviors continually noted. Pt demo 2 UE HEP with 5 reps each and returned back to bed. Pt reports soreness after 10 reps of theraband exercises. Educated pt on alternating resistive exercises with AROM exercises to further progress shoulder ROM/strength within tolerance, as well as incorporating R UE use during daily tasks as pt is R-hand dominant. Advised pt that continued R shoulder exercise is needed to avoid stiffness and continued pain. Pt verbalized understanding and no further skilled OT services needed at acute level. OT to sign off. Please reconsult if needs change.      Moderate protein calorie malnutrition Nutrition Problem: Moderate Malnutrition Etiology: social / environmental circumstances (IVDU) Signs/Symptoms: mild fat depletion,moderate fat depletion,mild muscle depletion,moderate muscle depletion Interventions: Ensure Enlive (each supplement provides 350kcal and 20 grams of protein),MVI Estimated body mass index is 23 kg/m as calculated from the following:   Height as of this encounter: '5\' 7"'  (1.702 m).   Weight as of this encounter: 66.6 kg.   Wounds: Incision (Closed) 12/06/20 Shoulder Right (Active)  Date First Assessed/Time First Assessed: 12/06/20 0914   Location: Shoulder  Location Orientation: Right    Assessments 12/06/2020  8:56 AM 12/24/2020  8:13 PM  Dressing Type Adhesive bandage None  Dressing Clean;Dry;Intact Clean;Dry;Intact  Site / Wound Assessment Dressing in place / Unable to assess Dry;Clean  Margins -- Attached edges (approximated)  Closure -- Skin glue  Drainage Amount None None     No Linked orders to display     Other problems: Hypertension -continue current regimen  Acute cystitis without hematuria  -MRSA in cultures, she is on antibiotics  Data Reviewed: Basic Metabolic Panel: Recent Labs  Lab 12/19/20 0821 12/20/20 0223 12/21/20 0500 12/23/20 0518  NA 134* 133* 130* 136  K  3.7 3.5 3.9 3.7  CL 102 99 100 103  CO2 '24 24 24 24  ' GLUCOSE 94 97 96 98  BUN '15 14 14 15  ' CREATININE 1.32* 1.33* 1.31* 1.26*  CALCIUM 8.4* 8.4* 8.8* 9.1  MG  --  1.9  --   --    Liver Function Tests: No results for input(s): AST, ALT, ALKPHOS, BILITOT, PROT, ALBUMIN in the last 168 hours. No results for input(s): LIPASE, AMYLASE in the last 168 hours. No results for input(s): AMMONIA in the last 168 hours. CBC: Recent Labs  Lab 12/19/20 0821 12/20/20 0223 12/23/20 0518  WBC 9.2 10.4 7.6  NEUTROABS 5.1  --   --   HGB 6.8* 8.6* 10.6*  HCT 21.5* 25.7* 32.9*  MCV 84.0 81.8 83.9  PLT 306 300 247   Cardiac Enzymes: Recent Labs  Lab 12/22/20 0500  CKTOTAL 11*   BNP (last 3 results) Recent Labs  11/27/20 0430 11/28/20 0505 12/10/20 1600  BNP 670.1* 455.5* 458.5*    ProBNP (last 3 results) No results for input(s): PROBNP in the last 8760 hours.  CBG: No results for input(s): GLUCAP in the last 168 hours.  No results found for this or any previous visit (from the past 240 hour(s)).   Studies: No results found.  Scheduled Meds: . sodium chloride   Intravenous Once  . amLODipine  10 mg Oral Daily  . chlorhexidine  15 mL Mouth Rinse BID  . Chlorhexidine Gluconate Cloth  6 each Topical Daily  . cloNIDine  0.1 mg Oral BID  . dicyclomine  20 mg Oral TID AC & HS  . docusate sodium  100 mg Oral BID  . feeding supplement  1 Container Oral BID BM  . feeding supplement  237 mL Oral BID BM  . ferrous sulfate  325 mg Oral Q breakfast  . folic acid  1 mg Oral Daily  . mouth rinse  15 mL Mouth Rinse q12n4p  . methadone  10 mg Oral Q12H  . multivitamin with minerals  1 tablet Oral Daily  . pantoprazole  40 mg Oral Daily  . polyethylene glycol  17 g Oral BID  . QUEtiapine  25 mg Oral BID  . senna  2 tablet Oral Daily  . senna-docusate  1 tablet Oral QHS  . sodium chloride flush  10-40 mL Intracatheter Q12H  . thiamine  100 mg Oral Daily   Continuous Infusions: .  sodium chloride Stopped (12/25/20 0541)  . DAPTOmycin (CUBICIN)  IV Stopped (12/24/20 2230)  . lactated ringers 10 mL/hr at 12/17/20 2351    Principal Problem:   Endocarditis of tricuspid valve Active Problems:   Severe sepsis due to methicillin resistant Staphylococcus aureus (MRSA) with acute organ dysfunction (HCC)   Poor dentition   Malnutrition (HCC)   Thrombocytopenia (HCC)   Tobacco dependence   Acute cystitis without hematuria   Malnutrition of moderate degree   Abdominal pain   Left foot pain   Septic embolism (HCC)   Staphylococcal arthritis of right shoulder (HCC)   MRSA bacteremia   Acute on chronic respiratory failure with hypoxia (HCC)   Sepsis ([place])   Iron deficiency anemia   Consultants: Infectious disease Cardiothoracic surgery Orthopedic surgery  Procedures:  2D echocardiogram  Anesthesia on 3/3  Irrigation and debridement right shoulder abscess on 3/5  Antibiotics: Anti-infectives (From admission, onward)   Start     Dose/Rate Route Frequency Ordered Stop   12/22/20 0000  linezolid (ZYVOX) 600 MG tablet        600 mg Oral 2 times daily 12/22/20 0830     12/19/20 2000  DAPTOmycin (CUBICIN) 650 mg in sodium chloride 0.9 % IVPB        650 mg 226 mL/hr over 30 Minutes Intravenous Daily 12/19/20 0900 01/19/21 2359   12/12/20 2000  DAPTOmycin (CUBICIN) 750 mg in sodium chloride 0.9 % IVPB  Status:  Discontinued        750 mg 230 mL/hr over 30 Minutes Intravenous Daily 12/12/20 0719 12/19/20 0900   12/09/20 1200  anidulafungin (ERAXIS) 100 mg in sodium chloride 0.9 % 100 mL IVPB  Status:  Discontinued        100 mg 78 mL/hr over 100 Minutes Intravenous Every 24 hours 12/08/20 1109 12/09/20 1013   12/08/20 1200  anidulafungin (ERAXIS) 200 mg in sodium chloride 0.9 % 200 mL IVPB        200 mg 78 mL/hr  over 200 Minutes Intravenous  Once 12/08/20 1109 12/08/20 1724   12/06/20 0833  vancomycin (VANCOCIN) powder  Status:  Discontinued          As needed  12/06/20 0834 12/06/20 0852   12/06/20 0600  ceFAZolin (ANCEF) IVPB 2g/100 mL premix        2 g 200 mL/hr over 30 Minutes Intravenous On call to O.R. 12/06/20 0306 12/06/20 0755   11/28/20 2000  DAPTOmycin (CUBICIN) 764 mg in sodium chloride 0.9 % IVPB  Status:  Discontinued        10 mg/kg  76.4 kg 230.6 mL/hr over 30 Minutes Intravenous Daily 11/28/20 0727 12/12/20 0719   11/24/20 2000  DAPTOmycin (CUBICIN) 827 mg in sodium chloride 0.9 % IVPB  Status:  Discontinued        10 mg/kg  82.7 kg 233.1 mL/hr over 30 Minutes Intravenous Daily 11/24/20 0821 11/28/20 0727   11/19/20 1000  DAPTOmycin (CUBICIN) 770 mg in sodium chloride 0.9 % IVPB  Status:  Discontinued        10 mg/kg  77 kg 230.8 mL/hr over 30 Minutes Intravenous Daily 11/19/20 0908 11/24/20 0821   11/19/20 1000  doxycycline (VIBRA-TABS) tablet 100 mg        100 mg Oral Every 12 hours 11/19/20 0908 11/23/20 2123   11/19/20 0439  vancomycin variable dose per unstable renal function (pharmacist dosing)  Status:  Discontinued         Does not apply See admin instructions 11/19/20 0439 11/19/20 0853   11/16/20 2000  vancomycin (VANCOREADY) IVPB 1250 mg/250 mL  Status:  Discontinued        1,250 mg 166.7 mL/hr over 90 Minutes Intravenous Every 24 hours 11/15/20 1933 11/16/20 1108   11/16/20 1200  vancomycin (VANCOCIN) IVPB 1000 mg/200 mL premix  Status:  Discontinued        1,000 mg 200 mL/hr over 60 Minutes Intravenous Every 12 hours 11/16/20 1108 11/19/20 0439   11/16/20 0745  metroNIDAZOLE (FLAGYL) IVPB 500 mg        500 mg 100 mL/hr over 60 Minutes Intravenous  Once 11/16/20 0741 11/16/20 0907   11/15/20 2237  vancomycin (VANCOCIN) 500 MG powder       Note to Pharmacy: Marinus Maw   : cabinet override      11/15/20 2237 11/16/20 1044   11/15/20 2000  ceFEPIme (MAXIPIME) 2 g in sodium chloride 0.9 % 100 mL IVPB  Status:  Discontinued        2 g 200 mL/hr over 30 Minutes Intravenous Every 12 hours 11/15/20 1933 11/16/20  1212   11/15/20 1945  vancomycin (VANCOREADY) IVPB 1500 mg/300 mL  Status:  Discontinued        1,500 mg 150 mL/hr over 120 Minutes Intravenous  Once 11/15/20 1933 11/15/20 1939   11/15/20 1945  vancomycin (VANCOCIN) IVPB 1000 mg/200 mL premix       "And" Linked Group Details   1,000 mg 200 mL/hr over 60 Minutes Intravenous  Once 11/15/20 1939 11/15/20 2231   11/15/20 1945  vancomycin (VANCOREADY) IVPB 500 mg/100 mL       "And" Linked Group Details   500 mg 100 mL/hr over 60 Minutes Intravenous  Once 11/15/20 1939 11/16/20 0002       Time spent: 20 minutes    Erin Hearing ANP  [facility] 7 am - 330 pm/M-F for direct patient care and secure chat Please refer to Amion for contact info 39  days

## 2020-12-25 NOTE — Progress Notes (Signed)
Nutrition Follow-up  DOCUMENTATION CODES:   Non-severe (moderate) malnutrition in context of social or environmental circumstances  INTERVENTION:   Increase to Boost Breeze po TID, each supplement provides 250 kcal and 9 grams of protein  Afternoon snack daily  Transition pt to room service w/ assist   D/c Ensure  Continue Magic cup BID with meals, each supplement provides 290 kcal and 9 grams of protein  Continue MVI with minerals daily  NUTRITION DIAGNOSIS:  Moderate Malnutrition related to social / environmental circumstances (IVDU) as evidenced by mild fat depletion,moderate fat depletion,mild muscle depletion,moderate muscle depletion. - ongoing  GOAL:  Patient will meet greater than or equal to 90% of their needs - progressing  MONITOR:  PO intake,Supplement acceptance,Labs,Weight trends,Skin,I & O's  REASON FOR ASSESSMENT:  Consult Assessment of nutrition requirement/status,Poor PO  ASSESSMENT:  51 YOF admitted for severe sepsis w/ MRSA acute organ dysfunction. Septic emboli to both lungs, kidneys, R should, L ankle. Hx of IVDU, acute resp failure, tachypnea, AKI, cellulitis on L ankle, chronic constipation.  02/20 - SLP eval, recommendation for full liquid diet  02/23 - SLP eval, Dysphagia 1 diet with thin liquids 02/26 - diet advanced to dysphagia 2, thin liquids 3/3- cortrak tube removed; s/p MRI of rt shoulder showed R subdeltoid abscess and reactive marrow edema 3/4- s/p BSE- advanced to regular diet with thin liquids  3/5- s/p I&D of R shoulder subdeltoid abscess w/ multiple cavities 3/10 R shoulder drain removed 3/14 repeat echocardiogram revealed persistent tricuspid valve endocarditis  No PO intake documented since last RD assessment. Pt reports appetite is mostly fair, though mentions she occasionally misses meals due to oversleeping. Will transition pt to room service appropriate with assist to help with this issue. Pt states that she enjoys Boost  Supplements, but would like for Ensure to be discontinued. Pt agreeable to continuing [employer]. Pt would like daily afternoon snack.  UOP: documented today  Medications: colace, ferrous sulfate, folvite mvi with minerals, protonix, miralax, senokot, senokot-s, thiamine Labs reviewed.  Diet Order:   Diet Order            Diet regular Room service appropriate? Yes with Assist; Fluid consistency: Thin  Diet effective now                 EDUCATION NEEDS:   No education needs have been identified at this time  Skin:  Skin Assessment: Skin Integrity Issues: Skin Integrity Issues:: Incisions Incisions: closed incision R shoulder  Last BM:  3/22  Height:   Ht Readings from Last 1 Encounters:  11/16/20 5\' 7"  (1.702 m)    Weight:   Wt Readings from Last 1 Encounters:  12/16/20 66.6 kg    Ideal Body Weight:  61.4 kg  BMI:  Body mass index is 23 kg/m.  Estimated Nutritional Needs:   Kcal:  2100-2300  Protein:  115-130 grams  Fluid:  > 2 L    12/18/20, MS, RD, LDN RD pager number and weekend/on-call pager number located in [place].

## 2020-12-25 NOTE — Progress Notes (Signed)
Patient seen and examined again today No new complaints no fever overnight.  Some soreness on the right shoulder area She is alert and oriented x3, nonfocal on exam Reports she has been ambulating.  She is doing well on room air. I have reviewed NP's assessment plan and discussed with her and agrees with the following addendum  Issue being addressed  79 old female with history of IVDA, tobacco abuse, malnutrition presents with myalgias and cough, diagnosed with sepsis in the ED subsequently needing admission to ICU for bacteremia tricuspid valve endocarditis septic emboli to lung kidneys shoulder girdle on the left.  Seen by CT surgery and infectious disease orthopedic. She has had a right shoulder subdeltoid abscess drainage.  Severe sepsis due to MRSA bacteremia Septic emboli to both lungs, kidneys right shoulder.left ankle Right subdeltoid abscess status post drainage of right subdeltoid abscess by orthopedic, off drain Tricuspid valve endocarditis-IVDA -Patient was seen by ID, CT surgery, infectious and pulmonary during this admission.   Continue daptomycin as per ID monitor CK.  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 Pain management: With NSAIDs ibuprofen, methadone being weaned down, continue symptomatic pain relief  Acute blood loss anemia/symptomatic anemia needing blood transfusions this admission hemoglobin is stable Migraine headache Acute hypoxic respiratory failure-resolved Nonoliguric AKI/ATN/acute cystitis withOUT hematuria-overall stable Moderate protein calorie malnutrition: On thiamine folate multivitamins ferrous sulfate and dietary supplement Constipation GERD: On PPI Hypertension-controlled on amlodipine and clonidine. Debility/deconditioning  Ongoing PT OT

## 2020-12-26 NOTE — Progress Notes (Addendum)
[facility] PROGRESS NOTE  Anne Bautista WVP:[id number] DOB: [birth year] DOA: 11/15/2020 PCP: Patient, No Pcp Per  Status:  Remains inpatient appropriate because:Unsafe d/c plan and IV treatments appropriate due to intensity of illness or inability to take PO   Dispo: The patient is from: Home              Anticipated d/c is to: SNF recommended by PT due to physical debility              Barriers to discharge: Lack of funding-currently Medicaid potential, history of substance abuse.  Patient can discharge on 3/31 after receiving IV dose of oritavancin followed by several more weeks of oral linezolid if she can afford this medication.  Of note prior to admission patient was residing at the [facility] [address].  **As of 3/21 patient now states she will be returning to the [facility] and she states her mother will be staying there with her.  Offered to contact patient's mother to update her on plans regarding procurement of medications and outpatient appointments but patient declined.**              Patient currently is not medically stable to d/c.  Patient will remain on IV daptomycin until 3/31 then she will transition to PO Zyvox for 2 weeks-she will require 1x dose oritavancin prior to dc on 3/21  CM notified and plan is to provide MATCH to pay 100% for Zyvox   Difficult to place patient No   Level of care: Med-Surg  Code Status: Full Family Communication: Patient only DVT prophylaxis: SQ Heparin Vaccination status: Unknown  Foley catheter: No  HPI: 35 year old female with history of IV drug use, tobacco use, depression came to the hospital with myalgias and cough, he was septic on arrival admitted to the ICU.  She was found to have MRSA bacteremia, tricuspid valve endocarditis as well as septic emboli to lungs, kidneys.  Infectious disease and cardiothoracic surgery consulted.  Hospital course complicated by worsening right shoulder pain found to have septic arthritis,  with cultures showing yeast   Subjective: Orts improved pain with the addition of scheduled low-dose ibuprofen along with Voltaren gel.  Updated once again on discharge date of 3/31 and plans to finish taper of methadone after discharge.  Patient agreeable to this plan.  Objective: Vitals:   12/26/20 0056 12/26/20 0345  BP: 129/74 119/68  Pulse: 81 82  Resp: 18 18  Temp: 97.7 F (36.5 C) 97.7 F (36.5 C)  SpO2: 99% 99%    Intake/Output Summary (Last 24 hours) at 12/26/2020 0828 Last data filed at 12/26/2020 0100 Gross per 24 hour  Intake 261.68 ml  Output 600 ml  Net -338.32 ml   Filed Weights   12/01/20 0328 12/02/20 0300 12/16/20 2100  Weight: 74 kg 75.2 kg 66.6 kg    Exam:  Constitutional: Alert, calm, no acute distress Respiratory: Anterior lung sounds clear, room air Heart: Pulse regular and nontachycardic, extremities warm to touch with adequate capillary refill.  Grade 3/5 systolic murmur left sternal border fourth intercostal space.   Abdomen: LBM 3/22, soft nontender with normoactive bowel sounds Musculoskeletal: Right shoulder Neurologic: CN 2-12 grossly intact.  Psychiatric: Alert and oriented x3.  Normal affect.   Assessment/Plan: Acute problems: Severe sepsis due to MRSA bacteremia/ -Complicated by septic emboli to both lungs and kidneys, right shoulder possibly septic left ankle (MRI refused by patient).  She also has a right subdeltoid abscess. Left ankle improving, no  further issues, no pain -ID, cardiothoracic surgery, orthopedic surgery following -Status post incision and drainage of right shoulder subdeltoid abscess with multiple cavities on 3/5 by Dr. Lucia Gaskins. Drain dc'd by ortho -Continue daptomycin until date of discharge.  Plan discharge on 3/31 to complete 2 weeks of oral linezolid after given 1x dose of IV Oritavancin -CK has remained stable on daptomycin  -Last blood cultures negative on 12/10/2020, PICC line placed 2/28.  Tricuspid valve  endocarditis -Cardiothoracic surgery evaluated patient early in the hospitalization for possible angio VAC debridement.  -Echocardiogram on 3/14 revealed persistent tricuspid valve endocarditis but due to location not amenable to angio vac.  Dr. Kipp Brood recommends following up in the outpatient setting once patient "clean" to discuss surgical intervention.  She is hemodynamically stable without any evidence of acute heart failure. -Recent chest x-ray unremarkable no evidence of heart failure.  BNP stable in the 450 range  Acute blood loss anemia, iron deficiency anemia  with episode of symptomatic anemia -post operatively, received a total of 2U pRBC, one on 3/6 and one on 3/7 -Hemoglobin had drifted downward to 6.8 on 3/18 and patient was given 2 units of PRBCs with improvement of hemoglobin to 8.6 -Given IV Iron on 3/21 per attending.  Hemoglobin on 3/22 has increased from 8.6-10.6.  Creatinine stable at 1.26.  Sodium stable at 136 -Begin daily oral iron replacement with Senokot at bedtime.  Septic emboli to left ankle and right shoulder -Status post drainage of right subdeltoid abscess -Drain removed by orthopedic team on 3/10 with documentation that sutures to remain in place for an additional week -Continues to have pain in the right shoulder.  Encouraged to perform AROM frequently to avoid frozen shoulder -Continue ibuprofen 400 mg scheduled twice daily-using lower dose regimen given mildly abnormal creatinine with a GFR of 53-57 recently-repeat labs in a.m. -We will also add Voltaren gel 4 times daily to right shoulder  Migraine headache -Patient reports recent nausea secondary to migraine holocord of the weekend and normally talks Excedrin Migraine which has been ordered as of 3/14.  Fever -Has been having waxing and waning febrile state throughout the admission without any new sources of infection determined.  IV drug use w/ Heroin, cocaine use, marijuana, tobacco  -cessation was  encouraged.   -UDS was positive for cocaine.   -Hep C and HIV are negative.  RPR is negative. -Is on methadone and she would like to continue this after discharge.  Did not wish to transition to Suboxone after discharge. -3/21 Attending spoke at length w/ patient and plan is to taper Suboxone prior to dc; as of 3/21 dose decr to 10 mg BID; current plan is to taper down every 5 days; next change will be 3/26 to 10 mg daily for an additional 5 days then likely will require 5 mg daily for 3 to 5 days after discharge -Methadone can interact with discharge antibiotic linezolid and cause QTC prolongation therefore patient will need to have EKG obtained prior to discharge -[facility] staff note has given patient contact information for MAT facilities in the area that she is responsible for contacting to arrange for outpatient follow-up  Acute hypoxic respiratory failure, narcotic withdrawal, bilateral pulmonary septic emboli  -hypoxia resolved, remains on room air   Nonoliguric AKI with ATN  -creatinine peaked at 2.8, fluctuating but overall stable, 1.4 today   Debility/deconditioning PT note 3/22 Pt independent in room and ambulating to the nurses station without assistance per unit staff. HEP provided on 3/18 and  pt reports performing independently in room between therapy sessions. Endurance is mildly decreased however pt still able to ambulate ~550' without an AD or DOE this session. Encouraged ambulation in room once an hour and a longer walk for exercise 2-3 times a day. Pt agreeable to physical therapy signing off at this time as acute PT goals are met. If needs change, please reconsult.   OT NOTE 3/22:     Per pt and staff, pt has been managing ADLs/mobility in room without AD and without assist including toileting, bathing, dressing. Pt denies any significant balance issues during mobility. Session focused on carryover of HEP with self limiting behaviors continually noted. Pt demo 2 UE HEP with 5  reps each and returned back to bed. Pt reports soreness after 10 reps of theraband exercises. Educated pt on alternating resistive exercises with AROM exercises to further progress shoulder ROM/strength within tolerance, as well as incorporating R UE use during daily tasks as pt is R-hand dominant. Advised pt that continued R shoulder exercise is needed to avoid stiffness and continued pain. Pt verbalized understanding and no further skilled OT services needed at acute level. OT to sign off. Please reconsult if needs change.      Moderate protein calorie malnutrition Nutrition Problem: Moderate Malnutrition Etiology: social / environmental circumstances (IVDU) Signs/Symptoms: mild fat depletion,moderate fat depletion,mild muscle depletion,moderate muscle depletion Interventions: Ensure Enlive (each supplement provides 350kcal and 20 grams of protein),MVI Estimated body mass index is 23 kg/m as calculated from the following:   Height as of this encounter: '5\' 7"'  (1.702 m).   Weight as of this encounter: 66.6 kg.   Wounds: Incision (Closed) 12/06/20 Shoulder Right (Active)  Date First Assessed/Time First Assessed: 12/06/20 0914   Location: Shoulder  Location Orientation: Right    Assessments 12/06/2020  8:56 AM 12/25/2020  8:05 PM  Dressing Type Adhesive bandage None  Dressing Clean;Dry;Intact Clean;Dry;Intact  Site / Wound Assessment Dressing in place / Unable to assess Clean;Dry  Drainage Amount None --     No Linked orders to display     Other problems: Hypertension -continue current regimen  Acute cystitis without hematuria  -MRSA in cultures, she is on antibiotics  Data Reviewed: Basic Metabolic Panel: Recent Labs  Lab 12/20/20 0223 12/21/20 0500 12/23/20 0518  NA 133* 130* 136  K 3.5 3.9 3.7  CL 99 100 103  CO2 '24 24 24  ' GLUCOSE 97 96 98  BUN '14 14 15  ' CREATININE 1.33* 1.31* 1.26*  CALCIUM 8.4* 8.8* 9.1  MG 1.9  --   --    Liver Function Tests: No results for  input(s): AST, ALT, ALKPHOS, BILITOT, PROT, ALBUMIN in the last 168 hours. No results for input(s): LIPASE, AMYLASE in the last 168 hours. No results for input(s): AMMONIA in the last 168 hours. CBC: Recent Labs  Lab 12/20/20 0223 12/23/20 0518  WBC 10.4 7.6  HGB 8.6* 10.6*  HCT 25.7* 32.9*  MCV 81.8 83.9  PLT 300 247   Cardiac Enzymes: Recent Labs  Lab 12/22/20 0500  CKTOTAL 11*   BNP (last 3 results) Recent Labs    11/27/20 0430 11/28/20 0505 12/10/20 1600  BNP 670.1* 455.5* 458.5*    ProBNP (last 3 results) No results for input(s): PROBNP in the last 8760 hours.  CBG: No results for input(s): GLUCAP in the last 168 hours.  No results found for this or any previous visit (from the past 240 hour(s)).   Studies: No results found.  Scheduled Meds: .  sodium chloride   Intravenous Once  . amLODipine  10 mg Oral Daily  . chlorhexidine  15 mL Mouth Rinse BID  . Chlorhexidine Gluconate Cloth  6 each Topical Daily  . cloNIDine  0.1 mg Oral BID  . diclofenac Sodium  4 g Topical QID  . dicyclomine  20 mg Oral TID AC & HS  . docusate sodium  100 mg Oral BID  . feeding supplement  1 Container Oral TID BM  . ferrous sulfate  325 mg Oral Q breakfast  . folic acid  1 mg Oral Daily  . ibuprofen  400 mg Oral BID WC  . mouth rinse  15 mL Mouth Rinse q12n4p  . methadone  10 mg Oral Q12H  . multivitamin with minerals  1 tablet Oral Daily  . pantoprazole  40 mg Oral Daily  . polyethylene glycol  17 g Oral BID  . QUEtiapine  25 mg Oral BID  . senna  2 tablet Oral Daily  . senna-docusate  1 tablet Oral QHS  . sodium chloride flush  10-40 mL Intracatheter Q12H  . thiamine  100 mg Oral Daily   Continuous Infusions: . sodium chloride 250 mL (12/25/20 2232)  . DAPTOmycin (CUBICIN)  IV Stopped (12/25/20 2312)  . lactated ringers 10 mL/hr at 12/17/20 2351    Principal Problem:   Endocarditis of tricuspid valve Active Problems:   Severe sepsis due to methicillin resistant  Staphylococcus aureus (MRSA) with acute organ dysfunction (HCC)   Poor dentition   Malnutrition (HCC)   Thrombocytopenia (HCC)   Tobacco dependence   Acute cystitis without hematuria   Malnutrition of moderate degree   Abdominal pain   Left foot pain   Septic embolism (HCC)   Staphylococcal arthritis of right shoulder ([place])   MRSA bacteremia   Acute on chronic respiratory failure with hypoxia (HCC)   Sepsis ([place])   Iron deficiency anemia   Consultants: Infectious disease Cardiothoracic surgery Orthopedic surgery  Procedures:  2D echocardiogram  Anesthesia on 3/3  Irrigation and debridement right shoulder abscess on 3/5  Antibiotics: Anti-infectives (From admission, onward)   Start     Dose/Rate Route Frequency Ordered Stop   12/22/20 0000  linezolid (ZYVOX) 600 MG tablet        600 mg Oral 2 times daily 12/22/20 0830     12/19/20 2000  DAPTOmycin (CUBICIN) 650 mg in sodium chloride 0.9 % IVPB        650 mg 226 mL/hr over 30 Minutes Intravenous Daily 12/19/20 0900 01/19/21 2359   12/12/20 2000  DAPTOmycin (CUBICIN) 750 mg in sodium chloride 0.9 % IVPB  Status:  Discontinued        750 mg 230 mL/hr over 30 Minutes Intravenous Daily 12/12/20 0719 12/19/20 0900   12/09/20 1200  anidulafungin (ERAXIS) 100 mg in sodium chloride 0.9 % 100 mL IVPB  Status:  Discontinued        100 mg 78 mL/hr over 100 Minutes Intravenous Every 24 hours 12/08/20 1109 12/09/20 1013   12/08/20 1200  anidulafungin (ERAXIS) 200 mg in sodium chloride 0.9 % 200 mL IVPB        200 mg 78 mL/hr over 200 Minutes Intravenous  Once 12/08/20 1109 12/08/20 1724   12/06/20 0833  vancomycin (VANCOCIN) powder  Status:  Discontinued          As needed 12/06/20 0834 12/06/20 0852   12/06/20 0600  ceFAZolin (ANCEF) IVPB 2g/100 mL premix        2  g 200 mL/hr over 30 Minutes Intravenous On call to O.R. 12/06/20 0306 12/06/20 0755   11/28/20 2000  DAPTOmycin (CUBICIN) 764 mg in sodium chloride 0.9 % IVPB  Status:   Discontinued        10 mg/kg  76.4 kg 230.6 mL/hr over 30 Minutes Intravenous Daily 11/28/20 0727 12/12/20 0719   11/24/20 2000  DAPTOmycin (CUBICIN) 827 mg in sodium chloride 0.9 % IVPB  Status:  Discontinued        10 mg/kg  82.7 kg 233.1 mL/hr over 30 Minutes Intravenous Daily 11/24/20 0821 11/28/20 0727   11/19/20 1000  DAPTOmycin (CUBICIN) 770 mg in sodium chloride 0.9 % IVPB  Status:  Discontinued        10 mg/kg  77 kg 230.8 mL/hr over 30 Minutes Intravenous Daily 11/19/20 0908 11/24/20 0821   11/19/20 1000  doxycycline (VIBRA-TABS) tablet 100 mg        100 mg Oral Every 12 hours 11/19/20 0908 11/23/20 2123   11/19/20 0439  vancomycin variable dose per unstable renal function (pharmacist dosing)  Status:  Discontinued         Does not apply See admin instructions 11/19/20 0439 11/19/20 0853   11/16/20 2000  vancomycin (VANCOREADY) IVPB 1250 mg/250 mL  Status:  Discontinued        1,250 mg 166.7 mL/hr over 90 Minutes Intravenous Every 24 hours 11/15/20 1933 11/16/20 1108   11/16/20 1200  vancomycin (VANCOCIN) IVPB 1000 mg/200 mL premix  Status:  Discontinued        1,000 mg 200 mL/hr over 60 Minutes Intravenous Every 12 hours 11/16/20 1108 11/19/20 0439   11/16/20 0745  metroNIDAZOLE (FLAGYL) IVPB 500 mg        500 mg 100 mL/hr over 60 Minutes Intravenous  Once 11/16/20 0741 11/16/20 0907   11/15/20 2237  vancomycin (VANCOCIN) 500 MG powder       Note to Pharmacy: Marinus Maw   : cabinet override      11/15/20 2237 11/16/20 1044   11/15/20 2000  ceFEPIme (MAXIPIME) 2 g in sodium chloride 0.9 % 100 mL IVPB  Status:  Discontinued        2 g 200 mL/hr over 30 Minutes Intravenous Every 12 hours 11/15/20 1933 11/16/20 1212   11/15/20 1945  vancomycin (VANCOREADY) IVPB 1500 mg/300 mL  Status:  Discontinued        1,500 mg 150 mL/hr over 120 Minutes Intravenous  Once 11/15/20 1933 11/15/20 1939   11/15/20 1945  vancomycin (VANCOCIN) IVPB 1000 mg/200 mL premix       "And" Linked  Group Details   1,000 mg 200 mL/hr over 60 Minutes Intravenous  Once 11/15/20 1939 11/15/20 2231   11/15/20 1945  vancomycin (VANCOREADY) IVPB 500 mg/100 mL       "And" Linked Group Details   500 mg 100 mL/hr over 60 Minutes Intravenous  Once 11/15/20 1939 11/16/20 0002       Time spent: 20 minutes  Attending note Patient seen and examined this morning physical examination chart reviewed and independently   43 old female with history of IVDA, tobacco abuse, malnutrition presents with myalgias and cough, diagnosed with sepsis in the ED subsequently needing admission to ICU for bacteremia tricuspid valve endocarditis septic emboli to lung kidneys shoulder girdle on the left.  Seen by CT surgery and infectious disease orthopedic. She has had a right shoulder subdeltoid abscess drainage.  Severe sepsis due to MRSA bacteremia Septic emboli to both lungs, kidneys  right shoulder.left ankle Right subdeltoid abscess status post drainage of right subdeltoid abscess by orthopedic, off drain Tricuspid valve endocarditis-IVDA -Patient was seen by ID, CT surgery, infectious and pulmonary during this admission.   Continue daptomycin as per ID monitor CK.  Noted as above plan for transition to Zyvox on discharge  IV drug use with heroin cocaine use marijuana use-cessation has been advised. Pain management: With NSAIDs ibuprofen, methadone being weaned down, continue symptomatic pain relief  Acute blood loss anemia/symptomatic anemia needing blood transfusions this admission hemoglobin is stable Migraine headache Acute hypoxic respiratory failure-resolved Nonoliguric AKI/ATN/acute cystitis withOUT hematuria-overall stable Moderate protein calorie malnutrition: On thiamine folate multivitamins ferrous sulfate and dietary supplement Constipation GERD: On PPI Hypertension-controlled on amlodipine and clonidine. Debility/deconditioning  Ongoing PT OT   Erin Hearing ANP  [facility] 7  am - 330 pm/M-F for direct patient care and secure chat Please refer to Amion for contact info 40  days

## 2020-12-27 LAB — CBC WITH DIFFERENTIAL/PLATELET
Abs Immature Granulocytes: 0.03 10*3/uL (ref 0.00–0.07)
Basophils Absolute: 0 10*3/uL (ref 0.0–0.1)
Basophils Relative: 1 %
Eosinophils Absolute: 0.1 10*3/uL (ref 0.0–0.5)
Eosinophils Relative: 1 %
HCT: 28.5 % — ABNORMAL LOW (ref 36.0–46.0)
Hemoglobin: 9.2 g/dL — ABNORMAL LOW (ref 12.0–15.0)
Immature Granulocytes: 0 %
Lymphocytes Relative: 32 %
Lymphs Abs: 2.6 10*3/uL (ref 0.7–4.0)
MCH: 27.2 pg (ref 26.0–34.0)
MCHC: 32.3 g/dL (ref 30.0–36.0)
MCV: 84.3 fL (ref 80.0–100.0)
Monocytes Absolute: 0.6 10*3/uL (ref 0.1–1.0)
Monocytes Relative: 8 %
Neutro Abs: 4.9 10*3/uL (ref 1.7–7.7)
Neutrophils Relative %: 58 %
Platelets: 266 10*3/uL (ref 150–400)
RBC: 3.38 MIL/uL — ABNORMAL LOW (ref 3.87–5.11)
RDW: 16.5 % — ABNORMAL HIGH (ref 11.5–15.5)
WBC: 8.3 10*3/uL (ref 4.0–10.5)
nRBC: 0 % (ref 0.0–0.2)

## 2020-12-27 LAB — COMPREHENSIVE METABOLIC PANEL
ALT: 24 U/L (ref 0–44)
AST: 26 U/L (ref 15–41)
Albumin: 2.2 g/dL — ABNORMAL LOW (ref 3.5–5.0)
Alkaline Phosphatase: 163 U/L — ABNORMAL HIGH (ref 38–126)
Anion gap: 7 (ref 5–15)
BUN: 13 mg/dL (ref 6–20)
CO2: 24 mmol/L (ref 22–32)
Calcium: 9.1 mg/dL (ref 8.9–10.3)
Chloride: 104 mmol/L (ref 98–111)
Creatinine, Ser: 1.25 mg/dL — ABNORMAL HIGH (ref 0.44–1.00)
GFR, Estimated: 57 mL/min — ABNORMAL LOW (ref >60)
Glucose, Bld: 104 mg/dL — ABNORMAL HIGH (ref 70–99)
Potassium: 4 mmol/L (ref 3.5–5.1)
Sodium: 135 mmol/L (ref 135–145)
Total Bilirubin: 0.4 mg/dL (ref 0.3–1.2)
Total Protein: 8.9 g/dL — ABNORMAL HIGH (ref 6.5–8.1)

## 2020-12-27 MED ORDER — ENOXAPARIN SODIUM 40 MG/0.4ML ~~LOC~~ SOLN
40.0000 mg | SUBCUTANEOUS | Status: DC
  Administered 2020-12-27 – 2020-12-28 (×2): 40 mg via SUBCUTANEOUS
  Filled 2020-12-27 (×3): qty 0.4

## 2020-12-27 MED ORDER — METHADONE HCL 10 MG PO TABS
10.0000 mg | ORAL_TABLET | Freq: Every day | ORAL | Status: DC
  Administered 2020-12-27 – 2020-12-28 (×2): 10 mg via ORAL
  Filled 2020-12-27 (×2): qty 1

## 2020-12-27 NOTE — Progress Notes (Signed)
PROGRESS NOTE    Anne CoulterLaura Bautista  AVW:[id number] DOB: 01/14/1986 DOA: 11/15/2020 PCP: Patient, No Pcp Per   Chief Complaint  Patient presents with  . Generalized Body Aches  Brief Narrative: [age] old female with history of IVDA, tobacco abuse, malnutrition presents with myalgias and cough, diagnosed with sepsis in the ED subsequently needing admission to ICU for bacteremia tricuspid valve endocarditis septic emboli to lung kidneys shoulder girdle on the left.  Seen by CT surgery and infectious disease orthopedic.She has had a right shoulder subdeltoid abscess drainage.  Subjective:  Overnight isolated fever but patient had been on multiple blankets.  Routine labs pending. Patient has no new complaints.  No shoulder pain. Reports she has been ambulating.  Assessment & Plan: Severe sepsis due to MRSA bacteremia Septic emboli to both lungs, kidneys right shoulder,left ankle Right subdeltoid abscess status post drainage Tricuspid valve endocarditis-IVDA -Patient was seen by ID, CT surgery, infectious and pulmonary during this admission.   As per ID remains on daptomycin until date of discharge.  Potentially planning for discharge on 3/31st to complete 2 weeks of oral linezolid after giving 1 dose of oritavancin.  Monitor CK level.  Monitor labs, overnight isolated temperature, WE WILL monitor and if has recurrence obtain blood culture-she has had waxing and waning fevers episodes without any source of infection.  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 Pain management: With NSAIDs ibuprofen, methadone being weaned down on 10 mg twice daily with plan to change to 10 mg daily 3/26-for additional 5 days and will likely require 5 mg daily for 35 days after discharge, continue symptomatic pain relief   Acute blood loss anemia/symptomatic anemia/iron deficiency anemia needing blood transfusions this admission hemoglobin is stable 8-10 gm, was 6.8 on 3/18. Con iron  supplementation and monitor H&H.    Migraine headache-stable. Acute hypoxic respiratory failure-resolved. Nonoliguric AKI/ATN/acute cystitis withOUT hematuria-overall stable. Moderate protein calorie malnutrition: On thiamine folate multivitamins ferrous sulfate and dietary supplement Constipation GERD: On PPI Hypertension-controlled on amlodipine and clonidine. Poor dentition Thrombocytopenia-moniotr  Tobacco dependence-cessation advised Debility/deconditioning :Ongoing PT OT  Diet Order            Diet regular Room service appropriate? Yes with Assist; Fluid consistency: Thin  Diet effective now                 Nutrition Problem: Moderate Malnutrition Etiology: social / environmental circumstances (IVDU) Signs/Symptoms: mild fat depletion,moderate fat depletion,mild muscle depletion,moderate muscle depletion Interventions: Ensure Enlive (each supplement provides 350kcal and 20 grams of protein),MVI Patient's Body mass index is 23 kg/m.  DVT prophylaxis: Place and maintain sequential compression device Start: 12/20/20 0808 SCDs Start: 11/16/20 1257 patient has been refusing chemical prophylaxis, counseled and reordered Lovenox 3/26. Code Status:   Code Status: Full Code  Family Communication: plan of care discussed with patient at bedside.  Status is: Inpatient Remains inpatient appropriate because:Unsafe d/c plan and IV treatments appropriate due to intensity of illness or inability to take PO  Dispo: The patient is from: Home              Anticipated d/c is to: Home              Patient currently is not medically stable to d/c.   Difficult to place patient No  Unresulted Labs (From admission, onward)          Start     Ordered   12/29/20 0500  Basic metabolic panel  Every Monday,  R     Question:  Specimen collection method  Answer:  Unit=Unit collect   12/26/20 0921   12/27/20 0500  Comprehensive metabolic panel  Tomorrow morning,   R       Question:  Specimen  collection method  Answer:  Unit=Unit collect   12/26/20 1333   12/22/20 0500  CK  Weekly,   R     Question:  Specimen collection method  Answer:  Lab=Lab collect   12/20/20 0947        Medications reviewed:  Scheduled Meds: . sodium chloride   Intravenous Once  . amLODipine  10 mg Oral Daily  . chlorhexidine  15 mL Mouth Rinse BID  . Chlorhexidine Gluconate Cloth  6 each Topical Daily  . cloNIDine  0.1 mg Oral BID  . diclofenac Sodium  4 g Topical QID  . dicyclomine  20 mg Oral TID AC & HS  . docusate sodium  100 mg Oral BID  . feeding supplement  1 Container Oral TID BM  . ferrous sulfate  325 mg Oral Q breakfast  . folic acid  1 mg Oral Daily  . ibuprofen  400 mg Oral BID WC  . mouth rinse  15 mL Mouth Rinse q12n4p  . methadone  10 mg Oral Q12H  . multivitamin with minerals  1 tablet Oral Daily  . pantoprazole  40 mg Oral Daily  . polyethylene glycol  17 g Oral BID  . QUEtiapine  25 mg Oral BID  . senna  2 tablet Oral Daily  . senna-docusate  1 tablet Oral QHS  . sodium chloride flush  10-40 mL Intracatheter Q12H  . thiamine  100 mg Oral Daily   Continuous Infusions: . sodium chloride 250 mL (12/26/20 2036)  . DAPTOmycin (CUBICIN)  IV Stopped (12/26/20 2121)  . lactated ringers 10 mL/hr at 12/17/20 2351    Consultants:see note  Procedures:see note  Antimicrobials: Anti-infectives (From admission, onward)   Start     Dose/Rate Route Frequency Ordered Stop   12/22/20 0000  linezolid (ZYVOX) 600 MG tablet        600 mg Oral 2 times daily 12/22/20 0830     12/19/20 2000  DAPTOmycin (CUBICIN) 650 mg in sodium chloride 0.9 % IVPB        650 mg 226 mL/hr over 30 Minutes Intravenous Daily 12/19/20 0900 01/19/21 2359   12/12/20 2000  DAPTOmycin (CUBICIN) 750 mg in sodium chloride 0.9 % IVPB  Status:  Discontinued        750 mg 230 mL/hr over 30 Minutes Intravenous Daily 12/12/20 0719 12/19/20 0900   12/09/20 1200  anidulafungin (ERAXIS) 100 mg in sodium chloride 0.9 %  100 mL IVPB  Status:  Discontinued        100 mg 78 mL/hr over 100 Minutes Intravenous Every 24 hours 12/08/20 1109 12/09/20 1013   12/08/20 1200  anidulafungin (ERAXIS) 200 mg in sodium chloride 0.9 % 200 mL IVPB        200 mg 78 mL/hr over 200 Minutes Intravenous  Once 12/08/20 1109 12/08/20 1724   12/06/20 0833  vancomycin (VANCOCIN) powder  Status:  Discontinued          As needed 12/06/20 0834 12/06/20 0852   12/06/20 0600  ceFAZolin (ANCEF) IVPB 2g/100 mL premix        2 g 200 mL/hr over 30 Minutes Intravenous On call to O.R. 12/06/20 0306 12/06/20 0755   11/28/20 2000  DAPTOmycin (CUBICIN) 764 mg in  sodium chloride 0.9 % IVPB  Status:  Discontinued        10 mg/kg  76.4 kg 230.6 mL/hr over 30 Minutes Intravenous Daily 11/28/20 0727 12/12/20 0719   11/24/20 2000  DAPTOmycin (CUBICIN) 827 mg in sodium chloride 0.9 % IVPB  Status:  Discontinued        10 mg/kg  82.7 kg 233.1 mL/hr over 30 Minutes Intravenous Daily 11/24/20 0821 11/28/20 0727   11/19/20 1000  DAPTOmycin (CUBICIN) 770 mg in sodium chloride 0.9 % IVPB  Status:  Discontinued        10 mg/kg  77 kg 230.8 mL/hr over 30 Minutes Intravenous Daily 11/19/20 0908 11/24/20 0821   11/19/20 1000  doxycycline (VIBRA-TABS) tablet 100 mg        100 mg Oral Every 12 hours 11/19/20 0908 11/23/20 2123   11/19/20 0439  vancomycin variable dose per unstable renal function (pharmacist dosing)  Status:  Discontinued         Does not apply See admin instructions 11/19/20 0439 11/19/20 0853   11/16/20 2000  vancomycin (VANCOREADY) IVPB 1250 mg/250 mL  Status:  Discontinued        1,250 mg 166.7 mL/hr over 90 Minutes Intravenous Every 24 hours 11/15/20 1933 11/16/20 1108   11/16/20 1200  vancomycin (VANCOCIN) IVPB 1000 mg/200 mL premix  Status:  Discontinued        1,000 mg 200 mL/hr over 60 Minutes Intravenous Every 12 hours 11/16/20 1108 11/19/20 0439   11/16/20 0745  metroNIDAZOLE (FLAGYL) IVPB 500 mg        500 mg 100 mL/hr over 60  Minutes Intravenous  Once 11/16/20 0741 11/16/20 0907   11/15/20 2237  vancomycin (VANCOCIN) 500 MG powder       Note to Pharmacy: Ihor Dow   : cabinet override      11/15/20 2237 11/16/20 1044   11/15/20 2000  ceFEPIme (MAXIPIME) 2 g in sodium chloride 0.9 % 100 mL IVPB  Status:  Discontinued        2 g 200 mL/hr over 30 Minutes Intravenous Every 12 hours 11/15/20 1933 11/16/20 1212   11/15/20 1945  vancomycin (VANCOREADY) IVPB 1500 mg/300 mL  Status:  Discontinued        1,500 mg 150 mL/hr over 120 Minutes Intravenous  Once 11/15/20 1933 11/15/20 1939   11/15/20 1945  vancomycin (VANCOCIN) IVPB 1000 mg/200 mL premix       "And" Linked Group Details   1,000 mg 200 mL/hr over 60 Minutes Intravenous  Once 11/15/20 1939 11/15/20 2231   11/15/20 1945  vancomycin (VANCOREADY) IVPB 500 mg/100 mL       "And" Linked Group Details   500 mg 100 mL/hr over 60 Minutes Intravenous  Once 11/15/20 1939 11/16/20 0002     Culture/Microbiology    Component Value Date/Time   SDES URINE, CLEAN CATCH 12/11/2020 1038   SPECREQUEST DAPTOMYCIN 12/11/2020 1038   CULT  12/11/2020 1038    NO GROWTH Performed at [facility], [address] [place], [place] [address]    REPTSTATUS 12/12/2020 FINAL 12/11/2020 1038    Other culture-see note  Objective: Vitals: Today's Vitals   12/26/20 1936 12/26/20 2314 12/27/20 0258 12/27/20 0557  BP: 107/69 120/70 125/73 110/70  Pulse: 93 90 (!) 111 88  Resp: 17 17 17 18   Temp: 98.5 F (36.9 C) 99 F (37.2 C) (!) 101.5 F (38.6 C) 98.3 F (36.8 C)  TempSrc: Oral Oral Oral Oral  SpO2: 100%  98% 99% 99%  Weight:      Height:      PainSc: 0-No pain 0-No pain 0-No pain     Intake/Output Summary (Last 24 hours) at 12/27/2020 0737 Last data filed at 12/27/2020 0316 Gross per 24 hour  Intake 511.88 ml  Output --  Net 511.88 ml   Filed Weights   12/01/20 0328 12/02/20 0300 12/16/20 2100  Weight: 74 kg 75.2 kg 66.6 kg   Weight change:    Intake/Output from previous day: 03/25 0701 - 03/26 0700 In: 511.9 [P.O.:360; IV Piggyback:151.9] Out: -  Intake/Output this shift: No intake/output data recorded. Filed Weights   12/01/20 0328 12/02/20 0300 12/16/20 2100  Weight: 74 kg 75.2 kg 66.6 kg    Examination: General exam:AAOx3, thin frail older than her stated age, on room air. HEENT:Oral mucosa moist, Ear/Nose WNL grossly,dentition normal. Respiratory system:Bilaterally diminished,no use of accessory muscle, non tender. Cardiovascular system:S1 & S2 +, regular, No JVD. Gastrointestinal system:Abdomen soft, NT,ND, BS+. Nervous System:Alert, awake, moving extremities and grossly nonfocal. Extremities:No edema, distal peripheral pulses palpable.  Skin:No rashes,no icterus. QQV:ZDGLOV muscle bulk,tone, power.  Data Reviewed: I have personally reviewed following labs and imaging studies CBC: Recent Labs  Lab 12/23/20 0518 12/27/20 0707  WBC 7.6 8.3  NEUTROABS  --  4.9  HGB 10.6* 9.2*  HCT 32.9* 28.5*  MCV 83.9 84.3  PLT 247 266   Basic Metabolic Panel: Recent Labs  Lab 12/21/20 0500 12/23/20 0518  NA 130* 136  K 3.9 3.7  CL 100 103  CO2 24 24  GLUCOSE 96 98  BUN 14 15  CREATININE 1.31* 1.26*  CALCIUM 8.8* 9.1   GFR: Estimated Creatinine Clearance: 60 mL/min (A) (by C-G formula based on SCr of 1.26 mg/dL (H)). Liver Function Tests: No results for input(s): AST, ALT, ALKPHOS, BILITOT, PROT, ALBUMIN in the last 168 hours. No results for input(s): LIPASE, AMYLASE in the last 168 hours. No results for input(s): AMMONIA in the last 168 hours. Coagulation Profile: No results for input(s): INR, PROTIME in the last 168 hours. Cardiac Enzymes: Recent Labs  Lab 12/22/20 0500  CKTOTAL 11*   BNP (last 3 results) No results for input(s): PROBNP in the last 8760 hours. HbA1C: No results for input(s): HGBA1C in the last 72 hours. CBG: No results for input(s): GLUCAP in the last 168 hours. Lipid  Profile: No results for input(s): CHOL, HDL, LDLCALC, TRIG, CHOLHDL, LDLDIRECT in the last 72 hours. Thyroid Function Tests: No results for input(s): TSH, T4TOTAL, FREET4, T3FREE, THYROIDAB in the last 72 hours. Anemia Panel: No results for input(s): VITAMINB12, FOLATE, FERRITIN, TIBC, IRON, RETICCTPCT in the last 72 hours. Sepsis Labs: No results for input(s): PROCALCITON, LATICACIDVEN in the last 168 hours.  No results found for this or any previous visit (from the past 240 hour(s)).   Radiology Studies: No results found.   LOS: 41 days   Lanae Boast, MD [facility]  12/27/2020, 7:37 AM

## 2020-12-27 NOTE — Progress Notes (Signed)
MEWS is driven by temperature and heart rate. Assessed Anne Bautista. Anne Bautista is  stable stated "Just a lot of blankets" Administered tylenol. Will continue to monitor

## 2020-12-28 NOTE — Progress Notes (Signed)
PROGRESS NOTE    Anne Bautista  UVO:[id number] DOB: [birth year] DOA: 11/15/2020 PCP: Patient, No Pcp Per   Chief Complaint  Patient presents with  . Generalized Body Aches  Brief Narrative: 21 old female with history of IVDA, tobacco abuse, malnutrition presents with myalgias and cough, diagnosed with sepsis in the ED subsequently needing admission to ICU for bacteremia tricuspid valve endocarditis septic emboli to lung kidneys shoulder girdle on the left.  Seen by CT surgery and infectious disease orthopedic.She has had a right shoulder subdeltoid abscess drainage. She has had prolonged hospitalization due to ongoing need for IV antibiotics/unsafe d/c on iv antibiotics.  Subjective: Low-grade temperature 100.7. Saturating well on room air, vitals stable Patient had low-grade fever but again nursing reports patient had lots of blankets.  Patient reports she uses a lot of blankets as she feels cold.  She denies cough joint pain chest pain nausea vomiting abdominal pain.  Assessment & Plan: Severe sepsis due to MRSA bacteremia Septic emboli to both lungs, kidneys right shoulder,left ankle Right subdeltoid abscess status post drainage Tricuspid valve endocarditis-IVDA -Patient was seen by ID, CT surgery, infectious and pulmonary during this admission.   As per ID remains on daptomycin until date of discharge.  Potentially planning for discharge on 3/31st to complete 2 weeks of oral linezolid after giving 1 dose of oritavancin.  Monitor CK level.  Has had intermittent fever- low grade temp this am-resolved.Monitor.  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 Pain management: With NSAIDs ibuprofen, methadone being weaned down on 10 mg twice daily with plan to change to 10 mg daily 3/26-for additional 5 days and will likely require 5 mg daily for 35 days after discharge, continue symptomatic pain relief   Acute blood loss anemia/symptomatic anemia/iron  deficiency anemia needing blood transfusions this admission hemoglobin is stable 8-10 gm, was 6.8 on 3/18. Con iron supplementation and monitor H&H.    Migraine headache-stable. Acute hypoxic respiratory failure-resolved. Nonoliguric AKI/ATN/acute cystitis withOUT hematuria-overall stable. Moderate protein calorie malnutrition: On thiamine folate multivitamins ferrous sulfate and dietary supplement Constipation GERD: On PPI Hypertension-controlled on amlodipine and clonidine. Poor dentition Thrombocytopenia-moniotr  Tobacco dependence-cessation advised Debility/deconditioning :Ongoing PT OT  Diet Order            Diet regular Room service appropriate? Yes with Assist; Fluid consistency: Thin  Diet effective now                 Nutrition Problem: Moderate Malnutrition Etiology: social / environmental circumstances (IVDU) Signs/Symptoms: mild fat depletion,moderate fat depletion,mild muscle depletion,moderate muscle depletion Interventions: Ensure Enlive (each supplement provides 350kcal and 20 grams of protein),MVI Patient's Body mass index is 23 kg/m.  DVT prophylaxis: enoxaparin (LOVENOX) injection 40 mg Start: 12/27/20 1000 Place and maintain sequential compression device Start: 12/20/20 0808 SCDs Start: 11/16/20 1257 patient has been refusing chemical prophylaxis, counseled and reordered Lovenox 3/26. Code Status:   Code Status: Full Code  Family Communication: plan of care discussed with patient at bedside.  Status is: Inpatient Remains inpatient appropriate because:Unsafe d/c plan and IV treatments appropriate due to intensity of illness or inability to take PO  Dispo: The patient is from: Home              Anticipated d/c is to: Home              Patient currently is not medically stable to d/c.   Difficult to place patient No  Unresulted Labs (From admission, onward)  Start     Ordered   12/29/20 0500  Basic metabolic panel  Every Monday,   R      Question:  Specimen collection method  Answer:  Unit=Unit collect   12/26/20 0921   12/22/20 0500  CK  Weekly,   R     Question:  Specimen collection method  Answer:  Lab=Lab collect   12/20/20 0947        Medications reviewed:  Scheduled Meds: . sodium chloride   Intravenous Once  . amLODipine  10 mg Oral Daily  . chlorhexidine  15 mL Mouth Rinse BID  . Chlorhexidine Gluconate Cloth  6 each Topical Daily  . cloNIDine  0.1 mg Oral BID  . diclofenac Sodium  4 g Topical QID  . dicyclomine  20 mg Oral TID AC & HS  . docusate sodium  100 mg Oral BID  . enoxaparin (LOVENOX) injection  40 mg Subcutaneous Q24H  . feeding supplement  1 Container Oral TID BM  . ferrous sulfate  325 mg Oral Q breakfast  . folic acid  1 mg Oral Daily  . ibuprofen  400 mg Oral BID WC  . mouth rinse  15 mL Mouth Rinse q12n4p  . methadone  10 mg Oral Daily  . multivitamin with minerals  1 tablet Oral Daily  . pantoprazole  40 mg Oral Daily  . polyethylene glycol  17 g Oral BID  . QUEtiapine  25 mg Oral BID  . senna  2 tablet Oral Daily  . sodium chloride flush  10-40 mL Intracatheter Q12H  . thiamine  100 mg Oral Daily   Continuous Infusions: . sodium chloride 250 mL (12/26/20 2036)  . DAPTOmycin (CUBICIN)  IV 650 mg (12/27/20 2111)  . lactated ringers 10 mL/hr at 12/17/20 2351    Consultants:see note  Procedures:see note  Antimicrobials: Anti-infectives (From admission, onward)   Start     Dose/Rate Route Frequency Ordered Stop   12/22/20 0000  linezolid (ZYVOX) 600 MG tablet        600 mg Oral 2 times daily 12/22/20 0830     12/19/20 2000  DAPTOmycin (CUBICIN) 650 mg in sodium chloride 0.9 % IVPB        650 mg 226 mL/hr over 30 Minutes Intravenous Daily 12/19/20 0900 01/19/21 2359   12/12/20 2000  DAPTOmycin (CUBICIN) 750 mg in sodium chloride 0.9 % IVPB  Status:  Discontinued        750 mg 230 mL/hr over 30 Minutes Intravenous Daily 12/12/20 0719 12/19/20 0900   12/09/20 1200   anidulafungin (ERAXIS) 100 mg in sodium chloride 0.9 % 100 mL IVPB  Status:  Discontinued        100 mg 78 mL/hr over 100 Minutes Intravenous Every 24 hours 12/08/20 1109 12/09/20 1013   12/08/20 1200  anidulafungin (ERAXIS) 200 mg in sodium chloride 0.9 % 200 mL IVPB        200 mg 78 mL/hr over 200 Minutes Intravenous  Once 12/08/20 1109 12/08/20 1724   12/06/20 0833  vancomycin (VANCOCIN) powder  Status:  Discontinued          As needed 12/06/20 0834 12/06/20 0852   12/06/20 0600  ceFAZolin (ANCEF) IVPB 2g/100 mL premix        2 g 200 mL/hr over 30 Minutes Intravenous On call to O.R. 12/06/20 0306 12/06/20 0755   11/28/20 2000  DAPTOmycin (CUBICIN) 764 mg in sodium chloride 0.9 % IVPB  Status:  Discontinued  10 mg/kg  76.4 kg 230.6 mL/hr over 30 Minutes Intravenous Daily 11/28/20 0727 12/12/20 0719   11/24/20 2000  DAPTOmycin (CUBICIN) 827 mg in sodium chloride 0.9 % IVPB  Status:  Discontinued        10 mg/kg  82.7 kg 233.1 mL/hr over 30 Minutes Intravenous Daily 11/24/20 0821 11/28/20 0727   11/19/20 1000  DAPTOmycin (CUBICIN) 770 mg in sodium chloride 0.9 % IVPB  Status:  Discontinued        10 mg/kg  77 kg 230.8 mL/hr over 30 Minutes Intravenous Daily 11/19/20 0908 11/24/20 0821   11/19/20 1000  doxycycline (VIBRA-TABS) tablet 100 mg        100 mg Oral Every 12 hours 11/19/20 0908 11/23/20 2123   11/19/20 0439  vancomycin variable dose per unstable renal function (pharmacist dosing)  Status:  Discontinued         Does not apply See admin instructions 11/19/20 0439 11/19/20 0853   11/16/20 2000  vancomycin (VANCOREADY) IVPB 1250 mg/250 mL  Status:  Discontinued        1,250 mg 166.7 mL/hr over 90 Minutes Intravenous Every 24 hours 11/15/20 1933 11/16/20 1108   11/16/20 1200  vancomycin (VANCOCIN) IVPB 1000 mg/200 mL premix  Status:  Discontinued        1,000 mg 200 mL/hr over 60 Minutes Intravenous Every 12 hours 11/16/20 1108 11/19/20 0439   11/16/20 0745  metroNIDAZOLE  (FLAGYL) IVPB 500 mg        500 mg 100 mL/hr over 60 Minutes Intravenous  Once 11/16/20 0741 11/16/20 0907   11/15/20 2237  vancomycin (VANCOCIN) 500 MG powder       Note to Pharmacy: Ihor Dow   : cabinet override      11/15/20 2237 11/16/20 1044   11/15/20 2000  ceFEPIme (MAXIPIME) 2 g in sodium chloride 0.9 % 100 mL IVPB  Status:  Discontinued        2 g 200 mL/hr over 30 Minutes Intravenous Every 12 hours 11/15/20 1933 11/16/20 1212   11/15/20 1945  vancomycin (VANCOREADY) IVPB 1500 mg/300 mL  Status:  Discontinued        1,500 mg 150 mL/hr over 120 Minutes Intravenous  Once 11/15/20 1933 11/15/20 1939   11/15/20 1945  vancomycin (VANCOCIN) IVPB 1000 mg/200 mL premix       "And" Linked Group Details   1,000 mg 200 mL/hr over 60 Minutes Intravenous  Once 11/15/20 1939 11/15/20 2231   11/15/20 1945  vancomycin (VANCOREADY) IVPB 500 mg/100 mL       "And" Linked Group Details   500 mg 100 mL/hr over 60 Minutes Intravenous  Once 11/15/20 1939 11/16/20 0002     Culture/Microbiology    Component Value Date/Time   SDES URINE, CLEAN CATCH 12/11/2020 1038   SPECREQUEST DAPTOMYCIN 12/11/2020 1038   CULT  12/11/2020 1038    NO GROWTH Performed at [facility], [address], [place], [place] [address]    REPTSTATUS 12/12/2020 FINAL 12/11/2020 1038    Other culture-see note  Objective: Vitals: Today's Vitals   12/27/20 2156 12/27/20 2300 12/28/20 0318 12/28/20 0724  BP:  126/69 (!) 124/109 130/82  Pulse:  94 (!) 106 (!) 118  Resp:   19 18  Temp:  99 F (37.2 C) 100.3 F (37.9 C) (!) 100.7 F (38.2 C)  TempSrc:  Oral Oral Oral  SpO2:  100% 98% 98%  Weight:      Height:      PainSc:  3  Asleep Asleep    No intake or output data in the 24 hours ending 12/28/20 0730 Filed Weights   12/01/20 0328 12/02/20 0300 12/16/20 2100  Weight: 74 kg 75.2 kg 66.6 kg   Weight change:   Intake/Output from previous day: No intake/output data recorded. Intake/Output this  shift: No intake/output data recorded. Filed Weights   12/01/20 0328 12/02/20 0300 12/16/20 2100  Weight: 74 kg 75.2 kg 66.6 kg    Examination: General exam: AAOx3, older for her age,NAD, weak appearing. HEENT:Oral mucosa moist, Ear/Nose WNL grossly, dentition normal. Respiratory system: bilaterally clear,no wheezing or crackles,no use of accessory muscle Cardiovascular system: S1 & S2 +, No JVD,. Gastrointestinal system: Abdomen soft, NT,ND, BS+ Nervous System:Alert, awake, moving extremities and grossly nonfocal Extremities: No edema, distal peripheral pulses palpable.  Skin: No rashes,no icterus. MSK: Normal muscle bulk,tone, power  Data Reviewed: I have personally reviewed following labs and imaging studies CBC: Recent Labs  Lab 12/23/20 0518 12/27/20 0707  WBC 7.6 8.3  NEUTROABS  --  4.9  HGB 10.6* 9.2*  HCT 32.9* 28.5*  MCV 83.9 84.3  PLT 247 266   Basic Metabolic Panel: Recent Labs  Lab 12/23/20 0518 12/27/20 0707  NA 136 135  K 3.7 4.0  CL 103 104  CO2 24 24  GLUCOSE 98 104*  BUN 15 13  CREATININE 1.26* 1.25*  CALCIUM 9.1 9.1   GFR: Estimated Creatinine Clearance: 60.5 mL/min (A) (by C-G formula based on SCr of 1.25 mg/dL (H)). Liver Function Tests: Recent Labs  Lab 12/27/20 0707  AST 26  ALT 24  ALKPHOS 163*  BILITOT 0.4  PROT 8.9*  ALBUMIN 2.2*   No results for input(s): LIPASE, AMYLASE in the last 168 hours. No results for input(s): AMMONIA in the last 168 hours. Coagulation Profile: No results for input(s): INR, PROTIME in the last 168 hours. Cardiac Enzymes: Recent Labs  Lab 12/22/20 0500  CKTOTAL 11*   BNP (last 3 results) No results for input(s): PROBNP in the last 8760 hours. HbA1C: No results for input(s): HGBA1C in the last 72 hours. CBG: No results for input(s): GLUCAP in the last 168 hours. Lipid Profile: No results for input(s): CHOL, HDL, LDLCALC, TRIG, CHOLHDL, LDLDIRECT in the last 72 hours. Thyroid Function  Tests: No results for input(s): TSH, T4TOTAL, FREET4, T3FREE, THYROIDAB in the last 72 hours. Anemia Panel: No results for input(s): VITAMINB12, FOLATE, FERRITIN, TIBC, IRON, RETICCTPCT in the last 72 hours. Sepsis Labs: No results for input(s): PROCALCITON, LATICACIDVEN in the last 168 hours.  No results found for this or any previous visit (from the past 240 hour(s)).   Radiology Studies: No results found.   LOS: 42 days   Anne Boastamesh Jairon Ripberger, MD [facility]  12/28/2020, 7:30 AM

## 2020-12-29 LAB — BASIC METABOLIC PANEL
Anion gap: 9 (ref 5–15)
BUN: 10 mg/dL (ref 6–20)
CO2: 22 mmol/L (ref 22–32)
Calcium: 9.1 mg/dL (ref 8.9–10.3)
Chloride: 100 mmol/L (ref 98–111)
Creatinine, Ser: 1.2 mg/dL — ABNORMAL HIGH (ref 0.44–1.00)
GFR, Estimated: >60 mL/min (ref >60)
Glucose, Bld: 115 mg/dL — ABNORMAL HIGH (ref 70–99)
Potassium: 3.6 mmol/L (ref 3.5–5.1)
Sodium: 131 mmol/L — ABNORMAL LOW (ref 135–145)

## 2020-12-29 LAB — CK: Total CK: 10 U/L — ABNORMAL LOW (ref 38–234)

## 2020-12-29 MED ORDER — SODIUM CHLORIDE 0.9 % IV SOLN
12.5000 mg | Freq: Once | INTRAVENOUS | Status: AC
  Administered 2020-12-29: 12.5 mg via INTRAVENOUS
  Filled 2020-12-29: qty 0.5

## 2020-12-29 MED ORDER — IBUPROFEN 200 MG PO TABS: 200.0000 mg | ORAL_TABLET | Freq: Four times a day (QID) | ORAL | Status: DC | PRN

## 2020-12-29 MED ORDER — METHADONE HCL 10 MG PO TABS
5.0000 mg | ORAL_TABLET | Freq: Every day | ORAL | Status: DC
  Administered 2020-12-29: 5 mg via ORAL
  Filled 2020-12-29: qty 1

## 2020-12-29 NOTE — Social Work (Addendum)
Update 6:40pm  EDCSW received update from [employer] that Pt is no longer at last known address. CSW updated Cheri Guppy      EDCSW was contacted to assist with locating Pt in the community. [employer] was notified.  Details of case and description of Pt were given. They will update. CSW will Product manager as details are gathered.

## 2020-12-29 NOTE — Progress Notes (Signed)
Patient was resting in her room when nurse went to reassess around 1545 patient was resting calmly, and was cooperative. CNA went to check patient's vitals around 1700 and found the room to be empty. Checked bathroom. Patient appeared to have left the unit without notifying any staff. Nurse notified charge nurse, unit director, [facility], security and patient's attending provider have been notified.

## 2020-12-29 NOTE — Progress Notes (Deleted)
[facility] PROGRESS NOTE  Anne Bautista JYN:[id number] DOB: August 21, 1986 DOA: 11/15/2020 PCP: Patient, No Pcp Per  Status:  Remains inpatient appropriate because:Unsafe d/c plan and IV treatments appropriate due to intensity of illness or inability to take PO   Dispo: The patient is from: Home              Anticipated d/c is to: SNF recommended by PT due to physical debility              Barriers to discharge: Lack of funding-currently Medicaid potential, history of substance abuse.  Patient can discharge on 3/31 after receiving IV dose of oritavancin followed by several more weeks of oral linezolid if she can afford this medication.  Of note prior to admission patient was residing at the [facility] [address].  **As of 3/21 patient now states she will be returning to the [facility] and she states her mother will be staying there with her.  Offered to contact patient's mother to update her on plans regarding procurement of medications and outpatient appointments but patient declined.**              Patient currently is not medically stable to d/c.  Patient will remain on IV daptomycin until 4/18 although she can discharge on 3/31 if able to afford oral linezolid.  As of 3/21 we have confirmed with TOC that out of pocket fee would be around 75 hours.  CM notified and plan is to provide MATCH to pay 100% for this prescription.   Difficult to place patient No   Level of care: Med-Surg  Code Status: Full Family Communication: Patient only DVT prophylaxis: SQ Heparin Vaccination status: Unknown  Foley catheter: No  HPI: 35 year old female with history of IV drug use, tobacco use, depression came to the hospital with myalgias and cough, he was septic on arrival admitted to the ICU.  She was found to have MRSA bacteremia, tricuspid valve endocarditis as well as septic emboli to lungs, kidneys.  Infectious disease and cardiothoracic surgery consulted.  Hospital course complicated by  worsening right shoulder pain found to have septic arthritis, with cultures showing yeast   Subjective: Reported nausea w/o abd pain  Objective: Vitals:   12/28/20 1231 12/28/20 1658  BP: 99/60 122/84  Pulse: 76 83  Resp: 18 18  Temp: 98 F (36.7 C) 98 F (36.7 C)  SpO2: 98% 98%   No intake or output data in the 24 hours ending 12/29/20 0812 Filed Weights   12/01/20 0328 12/02/20 0300 12/16/20 2100  Weight: 74 kg 75.2 kg 66.6 kg    Exam:  Constitutional: Calm, NAD Respiratory: cta, ra Heart: Regular, non tachy,grade 3/5 systolic murmur left sternal border fourth intercostal space.   Abdomen: LBM 3/21, nausea reported-no abd pain upon exam, BS + Musculoskeletal:  right shoulder UNREMARKABLE Neurologic: CN 2-12 grossly intact.  No focal neurological deficits and ambulates independently Psychiatric: A and O x 3, pleasant   Assessment/Plan: Acute problems: Severe sepsis due to MRSA bacteremia/ -Complicated by septic emboli to both lungs and kidneys, right shoulder possibly septic left ankle (MRI refused by patient).  She also has a right subdeltoid abscess. Left ankle improving, no further issues, no pain -ID, cardiothoracic surgery, orthopedic surgery following -Status post incision and drainage of right shoulder subdeltoid abscess with multiple cavities on 3/5 by Dr. Lucia Gaskins. Drain dc'd by ortho -Continue daptomycin until date of discharge.  Plan discharge date is  3/31 to complete 2 weeks of  oral linezolid.   -CK has remained stable on daptomycin  -Last blood cultures negative on 12/10/2020, PICC line placed 2/28.  Tricuspid valve endocarditis -Cardiothoracic surgery evaluated patient early in the hospitalization for possible angio VAC debridement.  -Echocardiogram on 3/14 revealed persistent tricuspid valve endocarditis but due to location not amenable to angio vac.  Dr. Kipp Brood recommends following up in the outpatient setting once patient "clean" to discuss surgical  intervention.  She is hemodynamically stable without any evidence of acute heart failure. -Recent chest x-ray unremarkable no evidence of heart failure.  BNP stable in the 450 range  Acute blood loss anemia, iron deficiency anemia  with episode of symptomatic anemia -post operatively, received a total of 2U pRBC, one on 3/6 and one on 3/7 -Hemoglobin had drifted downward to 6.8 on 3/18 and patient was given 2 units of PRBCs with improvement of hemoglobin to 8.6 -Given IV Iron on 3/21 per attending.  Hemoglobin on 3/22 has increased from 8.6-10.6.  Creatinine stable at 1.26.  Sodium stable at 136 -Begin daily oral iron replacement with Senokot at bedtime.  Septic emboli to left ankle and right shoulder -Status post drainage of right subdeltoid abscess -Drain removed by orthopedic team on 3/10 with documentation that sutures to remain in place for an additional week -Continues to have pain in the right shoulder.  Encouraged to perform AROM frequently to avoid frozen shoulder -Repeatedly states wishes to know weaned off of methadone therefore I will begin ibuprofen 400 mg scheduled twice daily-using lower dose regimen given mildly abnormal creatinine with a GFR of 53-57 recently -We will also add Voltaren gel 4 times daily to right shoulder  Migraine headache -Patient reports recent nausea secondary to migraine holocord of the weekend and normally talks Excedrin Migraine which has been ordered as of 3/14.  Fever -Has been having waxing and waning febrile state throughout the admission without any new sources of infection determined. -recurred again- refused wu including blood cx's  IV drug use w/ Heroin, cocaine use, marijuana, tobacco  -cessation was encouraged.   -UDS was positive for cocaine.   -Hep C and HIV are negative.  RPR is negative. -Is on methadone and she would like to continue this after discharge.  Did not wish to transition to Suboxone after discharge. -3/21 Attending spoke  at length w/ patient and plan is to taper Suboxone prior to dc; as of 3/21 dose decr to 10 mg BID; current plan is to taper down every 5 days; next change will be 3/26 to 10 mg daily for an additional 5 days then likely will require 5 mg daily for 3 to 5 days after discharge -Methadone can interact with discharge antibiotic linezolid and cause QTC prolongation therefore patient will need to have EKG obtained prior to discharge -[facility] staff note has given patient contact information for MAT facilities in the area that she is responsible for contacting to arrange for outpatient follow-up  Acute hypoxic respiratory failure, narcotic withdrawal, bilateral pulmonary septic emboli  -hypoxia resolved, remains on room air   Nonoliguric AKI with ATN  -creatinine peaked at 2.8, fluctuating but overall stable, 1.4 today   Debility/deconditioning PT note 3/22 Pt independent in room and ambulating to the nurses station without assistance per unit staff. HEP provided on 3/18 and pt reports performing independently in room between therapy sessions. Endurance is mildly decreased however pt still able to ambulate ~550' without an AD or DOE this session. Encouraged ambulation in room once an hour and a longer  walk for exercise 2-3 times a day. Pt agreeable to physical therapy signing off at this time as acute PT goals are met. If needs change, please reconsult.   OT NOTE 3/22:     Per pt and staff, pt has been managing ADLs/mobility in room without AD and without assist including toileting, bathing, dressing. Pt denies any significant balance issues during mobility. Session focused on carryover of HEP with self limiting behaviors continually noted. Pt demo 2 UE HEP with 5 reps each and returned back to bed. Pt reports soreness after 10 reps of theraband exercises. Educated pt on alternating resistive exercises with AROM exercises to further progress shoulder ROM/strength within tolerance, as well as incorporating R  UE use during daily tasks as pt is R-hand dominant. Advised pt that continued R shoulder exercise is needed to avoid stiffness and continued pain. Pt verbalized understanding and no further skilled OT services needed at acute level. OT to sign off. Please reconsult if needs change.      Moderate protein calorie malnutrition Nutrition Problem: Moderate Malnutrition Etiology: social / environmental circumstances (IVDU) Signs/Symptoms: mild fat depletion,moderate fat depletion,mild muscle depletion,moderate muscle depletion Interventions: Ensure Enlive (each supplement provides 350kcal and 20 grams of protein),MVI Estimated body mass index is 23 kg/m as calculated from the following:   Height as of this encounter: '5\' 7"'  (1.702 m).   Weight as of this encounter: 66.6 kg.   Wounds: Incision (Closed) 12/06/20 Shoulder Right (Active)  Date First Assessed/Time First Assessed: 12/06/20 0914   Location: Shoulder  Location Orientation: Right    Assessments 12/06/2020  8:56 AM 12/27/2020  8:00 PM  Dressing Type Adhesive bandage --  Dressing Clean;Dry;Intact --  Site / Wound Assessment Dressing in place / Unable to assess Dry;Clean  Drainage Amount None None     No Linked orders to display     Other problems: Hypertension -continue current regimen  Acute cystitis without hematuria  -MRSA in cultures, she is on antibiotics  Data Reviewed: Basic Metabolic Panel: Recent Labs  Lab 12/23/20 0518 12/27/20 0707  NA 136 135  K 3.7 4.0  CL 103 104  CO2 24 24  GLUCOSE 98 104*  BUN 15 13  CREATININE 1.26* 1.25*  CALCIUM 9.1 9.1   Liver Function Tests: Recent Labs  Lab 12/27/20 0707  AST 26  ALT 24  ALKPHOS 163*  BILITOT 0.4  PROT 8.9*  ALBUMIN 2.2*   No results for input(s): LIPASE, AMYLASE in the last 168 hours. No results for input(s): AMMONIA in the last 168 hours. CBC: Recent Labs  Lab 12/23/20 0518 12/27/20 0707  WBC 7.6 8.3  NEUTROABS  --  4.9  HGB 10.6* 9.2*  HCT  32.9* 28.5*  MCV 83.9 84.3  PLT 247 266   Cardiac Enzymes: No results for input(s): CKTOTAL, CKMB, CKMBINDEX, TROPONINI in the last 168 hours. BNP (last 3 results) Recent Labs    11/27/20 0430 11/28/20 0505 12/10/20 1600  BNP 670.1* 455.5* 458.5*    ProBNP (last 3 results) No results for input(s): PROBNP in the last 8760 hours.  CBG: No results for input(s): GLUCAP in the last 168 hours.  No results found for this or any previous visit (from the past 240 hour(s)).   Studies: No results found.  Scheduled Meds: . sodium chloride   Intravenous Once  . amLODipine  10 mg Oral Daily  . chlorhexidine  15 mL Mouth Rinse BID  . Chlorhexidine Gluconate Cloth  6 each Topical Daily  . cloNIDine  0.1 mg Oral BID  . diclofenac Sodium  4 g Topical QID  . dicyclomine  20 mg Oral TID AC & HS  . docusate sodium  100 mg Oral BID  . enoxaparin (LOVENOX) injection  40 mg Subcutaneous Q24H  . feeding supplement  1 Container Oral TID BM  . ferrous sulfate  325 mg Oral Q breakfast  . folic acid  1 mg Oral Daily  . ibuprofen  400 mg Oral BID WC  . mouth rinse  15 mL Mouth Rinse q12n4p  . methadone  10 mg Oral Daily  . multivitamin with minerals  1 tablet Oral Daily  . pantoprazole  40 mg Oral Daily  . polyethylene glycol  17 g Oral BID  . QUEtiapine  25 mg Oral BID  . senna  2 tablet Oral Daily  . sodium chloride flush  10-40 mL Intracatheter Q12H  . thiamine  100 mg Oral Daily   Continuous Infusions: . sodium chloride 250 mL (12/26/20 2036)  . DAPTOmycin (CUBICIN)  IV 650 mg (12/28/20 2208)  . lactated ringers 10 mL/hr at 12/17/20 2351    Principal Problem:   Endocarditis of tricuspid valve Active Problems:   Severe sepsis due to methicillin resistant Staphylococcus aureus (MRSA) with acute organ dysfunction (HCC)   Poor dentition   Malnutrition (HCC)   Thrombocytopenia (HCC)   Tobacco dependence   Acute cystitis without hematuria   Malnutrition of moderate degree    Abdominal pain   Left foot pain   Septic embolism (HCC)   Staphylococcal arthritis of right shoulder ([place])   MRSA bacteremia   Acute on chronic respiratory failure with hypoxia (HCC)   Sepsis ([place])   Iron deficiency anemia   Consultants: Infectious disease Cardiothoracic surgery Orthopedic surgery  Procedures:  2D echocardiogram  Anesthesia on 3/3  Irrigation and debridement right shoulder abscess on 3/5  Antibiotics: Anti-infectives (From admission, onward)   Start     Dose/Rate Route Frequency Ordered Stop   12/22/20 0000  linezolid (ZYVOX) 600 MG tablet        600 mg Oral 2 times daily 12/22/20 0830     12/19/20 2000  DAPTOmycin (CUBICIN) 650 mg in sodium chloride 0.9 % IVPB        650 mg 226 mL/hr over 30 Minutes Intravenous Daily 12/19/20 0900 01/19/21 2359   12/12/20 2000  DAPTOmycin (CUBICIN) 750 mg in sodium chloride 0.9 % IVPB  Status:  Discontinued        750 mg 230 mL/hr over 30 Minutes Intravenous Daily 12/12/20 0719 12/19/20 0900   12/09/20 1200  anidulafungin (ERAXIS) 100 mg in sodium chloride 0.9 % 100 mL IVPB  Status:  Discontinued        100 mg 78 mL/hr over 100 Minutes Intravenous Every 24 hours 12/08/20 1109 12/09/20 1013   12/08/20 1200  anidulafungin (ERAXIS) 200 mg in sodium chloride 0.9 % 200 mL IVPB        200 mg 78 mL/hr over 200 Minutes Intravenous  Once 12/08/20 1109 12/08/20 1724   12/06/20 0833  vancomycin (VANCOCIN) powder  Status:  Discontinued          As needed 12/06/20 0834 12/06/20 0852   12/06/20 0600  ceFAZolin (ANCEF) IVPB 2g/100 mL premix        2 g 200 mL/hr over 30 Minutes Intravenous On call to O.R. 12/06/20 0306 12/06/20 0755   11/28/20 2000  DAPTOmycin (CUBICIN) 764 mg in sodium chloride 0.9 % IVPB  Status:  Discontinued  10 mg/kg  76.4 kg 230.6 mL/hr over 30 Minutes Intravenous Daily 11/28/20 0727 12/12/20 0719   11/24/20 2000  DAPTOmycin (CUBICIN) 827 mg in sodium chloride 0.9 % IVPB  Status:  Discontinued        10  mg/kg  82.7 kg 233.1 mL/hr over 30 Minutes Intravenous Daily 11/24/20 0821 11/28/20 0727   11/19/20 1000  DAPTOmycin (CUBICIN) 770 mg in sodium chloride 0.9 % IVPB  Status:  Discontinued        10 mg/kg  77 kg 230.8 mL/hr over 30 Minutes Intravenous Daily 11/19/20 0908 11/24/20 0821   11/19/20 1000  doxycycline (VIBRA-TABS) tablet 100 mg        100 mg Oral Every 12 hours 11/19/20 0908 11/23/20 2123   11/19/20 0439  vancomycin variable dose per unstable renal function (pharmacist dosing)  Status:  Discontinued         Does not apply See admin instructions 11/19/20 0439 11/19/20 0853   11/16/20 2000  vancomycin (VANCOREADY) IVPB 1250 mg/250 mL  Status:  Discontinued        1,250 mg 166.7 mL/hr over 90 Minutes Intravenous Every 24 hours 11/15/20 1933 11/16/20 1108   11/16/20 1200  vancomycin (VANCOCIN) IVPB 1000 mg/200 mL premix  Status:  Discontinued        1,000 mg 200 mL/hr over 60 Minutes Intravenous Every 12 hours 11/16/20 1108 11/19/20 0439   11/16/20 0745  metroNIDAZOLE (FLAGYL) IVPB 500 mg        500 mg 100 mL/hr over 60 Minutes Intravenous  Once 11/16/20 0741 11/16/20 0907   11/15/20 2237  vancomycin (VANCOCIN) 500 MG powder       Note to Pharmacy: Marinus Maw   : cabinet override      11/15/20 2237 11/16/20 1044   11/15/20 2000  ceFEPIme (MAXIPIME) 2 g in sodium chloride 0.9 % 100 mL IVPB  Status:  Discontinued        2 g 200 mL/hr over 30 Minutes Intravenous Every 12 hours 11/15/20 1933 11/16/20 1212   11/15/20 1945  vancomycin (VANCOREADY) IVPB 1500 mg/300 mL  Status:  Discontinued        1,500 mg 150 mL/hr over 120 Minutes Intravenous  Once 11/15/20 1933 11/15/20 1939   11/15/20 1945  vancomycin (VANCOCIN) IVPB 1000 mg/200 mL premix       "And" Linked Group Details   1,000 mg 200 mL/hr over 60 Minutes Intravenous  Once 11/15/20 1939 11/15/20 2231   11/15/20 1945  vancomycin (VANCOREADY) IVPB 500 mg/100 mL       "And" Linked Group Details   500 mg 100 mL/hr over 60  Minutes Intravenous  Once 11/15/20 1939 11/16/20 0002       Time spent: 20 minutes    Erin Hearing ANP  [facility] 7 am - 330 pm/M-F for direct patient care and secure chat Please refer to Amion for contact info 43  days

## 2020-12-29 NOTE — Progress Notes (Signed)
Unit director spoke with social worker, patient no longer has a room at the motel she was previously staying at.

## 2020-12-29 NOTE — Progress Notes (Addendum)
[facility] PROGRESS NOTE  Anne Bautista YWV:[id number] DOB: [birth year] DOA: 11/15/2020 PCP: Patient, No Pcp Per  Status:  Remains inpatient appropriate because:Unsafe d/c plan and IV treatments appropriate due to intensity of illness or inability to take PO   Dispo: The patient is from: Home              Anticipated d/c is to: SNF recommended by PT due to physical debility              Barriers to discharge: Lack of funding-currently Medicaid potential, history of substance abuse.  Patient can discharge on 3/31 after receiving IV dose of oritavancin followed by several more weeks of oral linezolid if she can afford this medication.  Of note prior to admission patient was residing at the [facility] [address].  **As of 3/21 patient now states she will be returning to the [facility] and she states her mother will be staying there with her.  Offered to contact patient's mother to update her on plans regarding procurement of medications and outpatient appointments but patient declined.**              Patient currently is not medically stable to d/c.  Patient will remain on IV daptomycin until 4/18 although she can discharge on 3/31 if able to afford oral linezolid.  As of 3/21 we have confirmed with TOC that out of pocket fee would be around 75 hours.  CM notified and plan is to provide MATCH to pay 100% for this prescription.   Difficult to place patient No  Level of care: Med-Surg  Attending addendum 5:24 pm Case discussed with NP this am-patent was having low grade temp,did routine labs,blood culture, UA. She was refusing blood culture. I was informed by RN patient is not found in room and last seen an hour ago around 3.45-4 pm in unit with boyfriend and she appears to have ELOPED. I came to room and no belongings in room I came to unit and discussed with staffs.Discussed with charge RN in unit and RN has Activated  And contacted security (in unit now) /police.She has left with  PICC line and is danger of using it for drugs. We contacted the no listed for boyfriend not able to contact. I had discussed with this patient on multiple occasions- not to leave against medical advice and had discussed not to use illicit drugs and we had discussed risk of health hazards including but not limited to overdose,sepsis and death from drugs and she has verbalized understanding. Every effort is being made to contact her to retrieve the picc line and remove it and provide her with appropriate medical care.  Code Status: Full Family Communication: Patient only DVT prophylaxis: SQ Heparin Vaccination status: Unknown  Foley catheter: No  HPI: 35 year old female with history of IV drug use, tobacco use, depression came to the hospital with myalgias and cough, he was septic on arrival admitted to the ICU.  She was found to have MRSA bacteremia, tricuspid valve endocarditis as well as septic emboli to lungs, kidneys.  Infectious disease and cardiothoracic surgery consulted.  Hospital course complicated by worsening right shoulder pain found to have septic arthritis, with cultures showing yeast   Subjective: Awake.  Complaining of nausea today.  No other symptoms reported.  Suspect this may be related to scheduled ibuprofen so plans are to discontinue this and allow for as needed usage only.  No emesis or abdominal pain.  Objective: Vitals:   12/28/20  1658 12/29/20 0837  BP: 122/84 134/79  Pulse: 83 (!) 106  Resp: 18 16  Temp: 98 F (36.7 C) (!) 100.6 F (38.1 C)  SpO2: 98%    No intake or output data in the 24 hours ending 12/29/20 1204 Filed Weights   12/01/20 0328 12/02/20 0300 12/16/20 2100  Weight: 74 kg 75.2 kg 66.6 kg    Exam:  Constitutional: Awake, no acute distress Respiratory: Lungs clear to auscultation, room air Heart: No tachycardia normotensive grade 3/5 systolic murmur left sternal border fourth intercostal space.   Abdomen: LBM 3/27, not eating this morning  secondary to nausea abdomen nontender with normoactive bowel sounds Neurologic: CN 2-12 grossly intact.  No focal neurological deficits and ambulates independently Psychiatric: Alert and oriented x3.  Pleasant but clearly not feeling well secondary to nausea  Assessment/Plan: Acute problems: Severe sepsis due to MRSA bacteremia/ -Complicated by septic emboli to both lungs and kidneys, right shoulder possibly septic left ankle (MRI refused by patient).  She also has a right subdeltoid abscess. Left ankle improving, no further issues, no pain -ID, cardiothoracic surgery, orthopedic surgery following -Status post incision and drainage of right shoulder subdeltoid abscess with multiple cavities on 3/5 by Dr. Lucia Gaskins. Drain dc'd by ortho -Continue daptomycin until date of discharge.  Plan discharge date is  3/31 to complete 2 weeks of oral linezolid.   -CK has remained stable on daptomycin  -Last blood cultures negative on 12/10/2020, PICC line placed 2/28.  Tricuspid valve endocarditis -Cardiothoracic surgery evaluated patient early in the hospitalization for possible angio VAC debridement.  -Echocardiogram on 3/14 revealed persistent tricuspid valve endocarditis but due to location not amenable to angio vac.  Dr. Kipp Brood recommends following up in the outpatient setting once patient "clean" to discuss surgical intervention.  She is hemodynamically stable without any evidence of acute heart failure. -Recent chest x-ray unremarkable no evidence of heart failure.  BNP stable in the 450 range  Nausea -Suspect related to scheduled ibuprofen despite concurrent administration of PPI -We will change ibuprofen to as needed -Give one-time dose of IV Phenergan  Acute blood loss anemia, iron deficiency anemia  with episode of symptomatic anemia -post operatively, received a total of 2U pRBC, one on 3/6 and one on 3/7 -Hemoglobin had drifted downward to 6.8 on 3/18 and patient was given 2 units of PRBCs with  improvement of hemoglobin to 8.6 -Given IV Iron on 3/21 per attending.  Hemoglobin on 3/22 has increased from 8.6-10.6.  Creatinine stable at 1.26.  Sodium stable at 136 -Begin daily oral iron replacement with Senokot at bedtime.  Septic emboli to left ankle and right shoulder -Status post drainage of right subdeltoid abscess -Drain removed by orthopedic team on 3/10 with documentation that sutures to remain in place for an additional week -Continues to have pain in the right shoulder.  Encouraged to perform AROM frequently to avoid frozen shoulder -Repeatedly states wishes to know weaned off of methadone therefore I will begin ibuprofen 400 mg scheduled twice daily-using lower dose regimen given mildly abnormal creatinine with a GFR of 53-57 recently -We will also add Voltaren gel 4 times daily to right shoulder  Migraine headache -Patient reports recent nausea secondary to migraine holocord of the weekend and normally talks Excedrin Migraine which has been ordered as of 3/14.  Fever -Has been having waxing and waning febrile state throughout the admission without any new sources of infection determined. -On 3/28 patient had another subtle spike in temperature up to 100.6 F.  Attending physician concerned and recommended obtaining blood cultures urinalysis and culture as well.  Patient has refused work-up.  IV drug use w/ Heroin, cocaine use, marijuana, tobacco  -cessation was encouraged.   -UDS was positive for cocaine.   -Hep C and HIV are negative.  RPR is negative. -Is on methadone and she would like to continue this after discharge.  Did not wish to transition to Suboxone after discharge. -3/21 Attending spoke at length w/ patient and plan is to taper Suboxone prior to dc; as of 3/21 dose decr to 10 mg BID; current plan is to taper down every 5 days; next change will be 3/26 to 10 mg daily for an additional 5 days then likely will require 5 mg daily for 3 to 5 days after  discharge -Methadone can interact with discharge antibiotic linezolid and cause QTC prolongation therefore patient will need to have EKG obtained prior to discharge -TOC staff note has given patient contact information for MAT facilities in the area that she is responsible for contacting to arrange for outpatient follow-up  Acute hypoxic respiratory failure, narcotic withdrawal, bilateral pulmonary septic emboli  -hypoxia resolved, remains on room air   Nonoliguric AKI with ATN  -creatinine peaked at 2.8, fluctuating but overall stable, 1.4 today   Debility/deconditioning PT note 3/22 Pt independent in room and ambulating to the nurses station without assistance per unit staff. HEP provided on 3/18 and pt reports performing independently in room between therapy sessions. Endurance is mildly decreased however pt still able to ambulate ~550' without an AD or DOE this session. Encouraged ambulation in room once an hour and a longer walk for exercise 2-3 times a day. Pt agreeable to physical therapy signing off at this time as acute PT goals are met. If needs change, please reconsult.   OT NOTE 3/22:     Per pt and staff, pt has been managing ADLs/mobility in room without AD and without assist including toileting, bathing, dressing. Pt denies any significant balance issues during mobility. Session focused on carryover of HEP with self limiting behaviors continually noted. Pt demo 2 UE HEP with 5 reps each and returned back to bed. Pt reports soreness after 10 reps of theraband exercises. Educated pt on alternating resistive exercises with AROM exercises to further progress shoulder ROM/strength within tolerance, as well as incorporating R UE use during daily tasks as pt is R-hand dominant. Advised pt that continued R shoulder exercise is needed to avoid stiffness and continued pain. Pt verbalized understanding and no further skilled OT services needed at acute level. OT to sign off. Please reconsult  if needs change.      Moderate protein calorie malnutrition Nutrition Problem: Moderate Malnutrition Etiology: social / environmental circumstances (IVDU) Signs/Symptoms: mild fat depletion,moderate fat depletion,mild muscle depletion,moderate muscle depletion Interventions: Ensure Enlive (each supplement provides 350kcal and 20 grams of protein),MVI Estimated body mass index is 23 kg/m as calculated from the following:   Height as of this encounter: 5' 7" (1.702 m).   Weight as of this encounter: 66.6 kg.   Wounds: Incision (Closed) 12/06/20 Shoulder Right (Active)  Date First Assessed/Time First Assessed: 12/06/20 0914   Location: Shoulder  Location Orientation: Right    Assessments 12/06/2020  8:56 AM 12/27/2020  8:00 PM  Dressing Type Adhesive bandage --  Dressing Clean;Dry;Intact --  Site / Wound Assessment Dressing in place / Unable to assess Dry;Clean  Drainage Amount None None     No Linked orders to display       Other problems: Hypertension -continue current regimen  Acute cystitis without hematuria  -MRSA in cultures, she is on antibiotics  Data Reviewed: Basic Metabolic Panel: Recent Labs  Lab 12/23/20 0518 12/27/20 0707 12/29/20 1044  NA 136 135 131*  K 3.7 4.0 3.6  CL 103 104 100  CO2 _0 GLUCOSE 98 104* 115*  BUN _1 CREATININE 1.26* 1.25* 1.20*  CALCIUM 9.1 9.1 9.1   Liver Function Tests: Recent Labs  Lab 12/27/20 0707  AST 26  ALT 24  ALKPHOS 163*  BILITOT 0.4  PROT 8.9*  ALBUMIN 2.2*   No results for input(s): LIPASE, AMYLASE in the last 168 hours. No results for input(s): AMMONIA in the last 168 hours. CBC: Recent Labs  Lab 12/23/20 0518 12/27/20 0707  WBC 7.6 8.3  NEUTROABS  --  4.9  HGB 10.6* 9.2*  HCT 32.9* 28.5*  MCV 83.9 84.3  PLT 247 266   Cardiac Enzymes: Recent Labs  Lab 12/29/20 1044  CKTOTAL 10*   BNP (last 3 results) Recent Labs    11/27/20 0430 11/28/20 0505 12/10/20 1600  BNP 670.1* 455.5*  458.5*    ProBNP (last 3 results) No results for input(s): PROBNP in the last 8760 hours.  CBG: No results for input(s): GLUCAP in the last 168 hours.  No results found for this or any previous visit (from the past 240 hour(s)).   Studies: No results found.  Scheduled Meds: . sodium chloride   Intravenous Once  . amLODipine  10 mg Oral Daily  . chlorhexidine  15 mL Mouth Rinse BID  . Chlorhexidine Gluconate Cloth  6 each Topical Daily  . cloNIDine  0.1 mg Oral BID  . diclofenac Sodium  4 g Topical QID  . dicyclomine  20 mg Oral TID AC & HS  . docusate sodium  100 mg Oral BID  . enoxaparin (LOVENOX) injection  40 mg Subcutaneous Q24H  . feeding supplement  1 Container Oral TID BM  . ferrous sulfate  325 mg Oral Q breakfast  . folic acid  1 mg Oral Daily  . mouth rinse  15 mL Mouth Rinse q12n4p  . methadone  5 mg Oral Daily  . multivitamin with minerals  1 tablet Oral Daily  . pantoprazole  40 mg Oral Daily  . polyethylene glycol  17 g Oral BID  . QUEtiapine  25 mg Oral BID  . senna  2 tablet Oral Daily  . sodium chloride flush  10-40 mL Intracatheter Q12H  . thiamine  100 mg Oral Daily   Continuous Infusions: . sodium chloride 250 mL (12/26/20 2036)  . DAPTOmycin (CUBICIN)  IV 650 mg (12/28/20 2208)  . lactated ringers 10 mL/hr at 12/17/20 2351  . promethazine (PHENERGAN) injection 12.5 mg (12/29/20 1151)    Principal Problem:   Endocarditis of tricuspid valve Active Problems:   Severe sepsis due to methicillin resistant Staphylococcus aureus (MRSA) with acute organ dysfunction (HCC)   Poor dentition   Malnutrition (HCC)   Thrombocytopenia (HCC)   Tobacco dependence   Acute cystitis without hematuria   Malnutrition of moderate degree   Abdominal pain   Left foot pain   Septic embolism (HCC)   Staphylococcal arthritis of right shoulder (HCC)   MRSA bacteremia   Acute on chronic respiratory failure with hypoxia ([place])   Sepsis ([place])   Iron deficiency  anemia   Consultants: Infectious disease Cardiothoracic surgery Orthopedic surgery  Procedures:  2D echocardiogram  Anesthesia on 3/3  Irrigation and debridement right shoulder abscess on 3/5  Antibiotics: Anti-infectives (From admission, onward)   Start     Dose/Rate Route Frequency Ordered Stop   12/22/20 0000  linezolid (ZYVOX) 600 MG tablet        600 mg Oral 2 times daily 12/22/20 0830     12/19/20 2000  DAPTOmycin (CUBICIN) 650 mg in sodium chloride 0.9 % IVPB        650 mg 226 mL/hr over 30 Minutes Intravenous Daily 12/19/20 0900 01/19/21 2359   12/12/20 2000  DAPTOmycin (CUBICIN) 750 mg in sodium chloride 0.9 % IVPB  Status:  Discontinued        750 mg 230 mL/hr over 30 Minutes Intravenous Daily 12/12/20 0719 12/19/20 0900   12/09/20 1200  anidulafungin (ERAXIS) 100 mg in sodium chloride 0.9 % 100 mL IVPB  Status:  Discontinued        100 mg 78 mL/hr over 100 Minutes Intravenous Every 24 hours 12/08/20 1109 12/09/20 1013   12/08/20 1200  anidulafungin (ERAXIS) 200 mg in sodium chloride 0.9 % 200 mL IVPB        200 mg 78 mL/hr over 200 Minutes Intravenous  Once 12/08/20 1109 12/08/20 1724   12/06/20 0833  vancomycin (VANCOCIN) powder  Status:  Discontinued          As needed 12/06/20 0834 12/06/20 0852   12/06/20 0600  ceFAZolin (ANCEF) IVPB 2g/100 mL premix        2 g 200 mL/hr over 30 Minutes Intravenous On call to O.R. 12/06/20 0306 12/06/20 0755   11/28/20 2000  DAPTOmycin (CUBICIN) 764 mg in sodium chloride 0.9 % IVPB  Status:  Discontinued        10 mg/kg  76.4 kg 230.6 mL/hr over 30 Minutes Intravenous Daily 11/28/20 0727 12/12/20 0719   11/24/20 2000  DAPTOmycin (CUBICIN) 827 mg in sodium chloride 0.9 % IVPB  Status:  Discontinued        10 mg/kg  82.7 kg 233.1 mL/hr over 30 Minutes Intravenous Daily 11/24/20 0821 11/28/20 0727   11/19/20 1000  DAPTOmycin (CUBICIN) 770 mg in sodium chloride 0.9 % IVPB  Status:  Discontinued        10 mg/kg  77 kg 230.8  mL/hr over 30 Minutes Intravenous Daily 11/19/20 0908 11/24/20 0821   11/19/20 1000  doxycycline (VIBRA-TABS) tablet 100 mg        100 mg Oral Every 12 hours 11/19/20 0908 11/23/20 2123   11/19/20 0439  vancomycin variable dose per unstable renal function (pharmacist dosing)  Status:  Discontinued         Does not apply See admin instructions 11/19/20 0439 11/19/20 0853   11/16/20 2000  vancomycin (VANCOREADY) IVPB 1250 mg/250 mL  Status:  Discontinued        1,250 mg 166.7 mL/hr over 90 Minutes Intravenous Every 24 hours 11/15/20 1933 11/16/20 1108   11/16/20 1200  vancomycin (VANCOCIN) IVPB 1000 mg/200 mL premix  Status:  Discontinued        1,000 mg 200 mL/hr over 60 Minutes Intravenous Every 12 hours 11/16/20 1108 11/19/20 0439   11/16/20 0745  metroNIDAZOLE (FLAGYL) IVPB 500 mg        500 mg 100 mL/hr over 60 Minutes Intravenous  Once 11/16/20 0741 11/16/20 0907   11/15/20 2237  vancomycin (VANCOCIN) 500 MG powder       Note to Pharmacy: Peel, [name]   : cabinet override      11/15/20 2237 11/16/20 1044     11/15/20 2000  ceFEPIme (MAXIPIME) 2 g in sodium chloride 0.9 % 100 mL IVPB  Status:  Discontinued        2 g 200 mL/hr over 30 Minutes Intravenous Every 12 hours 11/15/20 1933 11/16/20 1212   11/15/20 1945  vancomycin (VANCOREADY) IVPB 1500 mg/300 mL  Status:  Discontinued        1,500 mg 150 mL/hr over 120 Minutes Intravenous  Once 11/15/20 1933 11/15/20 1939   11/15/20 1945  vancomycin (VANCOCIN) IVPB 1000 mg/200 mL premix       "And" Linked Group Details   1,000 mg 200 mL/hr over 60 Minutes Intravenous  Once 11/15/20 1939 11/15/20 2231   11/15/20 1945  vancomycin (VANCOREADY) IVPB 500 mg/100 mL       "And" Linked Group Details   500 mg 100 mL/hr over 60 Minutes Intravenous  Once 11/15/20 1939 11/16/20 0002       Time spent: 20 minutes    Allison Ellis ANP  [facility] 7 am - 330 pm/M-F for direct patient care and secure chat Please refer to Amion for  contact info 43  days  

## 2020-12-29 NOTE — Progress Notes (Signed)
Pharmacy Antibiotic Note  Anne Bautista is a 35 y.o. female admitted on 11/15/2020 with Sepsis 2/2 MRSA IE with septic emboli to both lungs, kidneys, R shoulder and possibly L ankle.,.  Pharmacy has been consulted for Daptomycin dosing.  Renal function has remained stable with SCr in the 1.2-1.4 range. CK today is 10 - so no concern for ADE related to Daptomycin. The dose was adjusted on 3/18 due to an updated weight. Will continue the current dosing and monitor.   Cefepime 2/12 >> 2/13 Vancomycin 2/12 >> 2/16 Dapto 2/16>>4/18 Doxy 2/16>>2/20  2/17: CK 280 2/20: CK 45 2/27: CK 23 3/13: CK 13 3/21: CK 11 3/28: CK 10  2/12Bcx: 3/3 MRSA  2/12 COVIDPCR:neg 2/12 UCx: > 100K MRSA 2/14 Bcx: 1/4 MRSA 2/14 R shoulder fluid: MRSA 2/18 Bcx negative 3/5 Wcx: rare GPC 3/9 BCx >> NGf 3/9 UCx >>negF  Plan: -Dapto for MRSA TV IE 10mg /kg/24h -CK weekly on Mondays  Height: 5\' 7"  (170.2 cm) Weight: 66.6 kg (146 lb 13.2 oz) IBW/kg (Calculated) : 61.6  Temp (24hrs), Avg:98.9 F (37.2 C), Min:98 F (36.7 C), Max:100.6 F (38.1 C)  Recent Labs  Lab 12/23/20 0518 12/27/20 0707 12/29/20 1044  WBC 7.6 8.3  --   CREATININE 1.26* 1.25* 1.20*    Estimated Creatinine Clearance: 63 mL/min (A) (by C-G formula based on SCr of 1.2 mg/dL (H)).    No Known Allergies  Ruel Dimmick A. 12/29/20, PharmD, BCPS, FNKF Clinical Pharmacist [place] Please utilize Amion for appropriate phone number to reach the unit pharmacist [facility])

## 2020-12-29 NOTE — Progress Notes (Signed)
Notified SW on call of patient leaving without order and having PICC. SW to send [employer] to last known address.

## 2020-12-30 NOTE — Discharge Summary (Signed)
Physician Discharge Summary  Anne Bautista ZTI:[id number] DOB: [birth year] DOA: 11/15/2020  PCP: Patient, No Pcp Per  Admit date: 11/15/2020 Discharge date: 12/30/2020  Time spent: 60 minutes  Eloped with PICC line in place/AMA  Recommendations for Outpatient Follow-up:  1. Patient left AMA/eloped therefore no formal discharge recommendations or instructions given to the patient. 2. Patient was scheduled to complete her daptomycin IV on 3/31 and this was to be followed by linezolid 600 twice daily for 14 days.  If patient represents to the hospital for admission please keep this in mind when developing a plan of care.   Discharge Diagnoses:  Principal Problem:   Endocarditis of tricuspid valve Active Problems:   Severe sepsis due to methicillin resistant Staphylococcus aureus (MRSA) with acute organ dysfunction (HCC)   Poor dentition   Malnutrition (HCC)   Thrombocytopenia (HCC)   Tobacco dependence   Acute cystitis without hematuria   Malnutrition of moderate degree   Abdominal pain   Left foot pain   Septic embolism (HCC)   Staphylococcal arthritis of right shoulder (HCC)   MRSA bacteremia   Acute on chronic respiratory failure with hypoxia (HCC)   Sepsis (HCC)   Iron deficiency anemia  SEPSIS RESOLVED  Discharge Condition: Stable  Diet recommendation: Continue regular diet as during admission  Filed Weights   12/01/20 0328 12/02/20 0300 12/16/20 2100  Weight: 74 kg 75.2 kg 66.6 kg    History of present illness:  35 year old female with history of IV drug use, tobacco use, depression came to the hospital with myalgias and cough, he was septic on arrival admitted to the ICU. She was found to have MRSA bacteremia, tricuspid valve endocarditis as well as septic emboli to lungs, kidneys. Infectious disease and cardiothoracic surgery consulted. Hospital course complicated by worsening right shoulder pain found to have septic arthritis  Patient remained clinically stable and  was having intermittent issues of nocturnal fevers with follow-up cultures otherwise negative including blood and urine cultures.  As of date of elopement plan was to continue IV daptomycin through 3/31 and patient was to receive a dose of IV oritavancin prior to discharge.  This was to be followed by 2 weeks of twice daily linezolid.  On date of elopement patient had been complaining of nausea without abdominal pain or vomiting.  Because she had spiked a low-grade fever during the night blood cultures, urinalysis and culture were ordered but patient refused.  Patient was last assessed on the nursing unit at 1545 and she was resting calmly and cooperative.  CNA went to the patient's room around 1700 to check vitals and room was empty.  It appears patient left the unit without notifying staff.  Unfortunately PICC line remains in place.  Please see nursing notes regarding documentation of notification of appropriate supervisory staff.  Security was also notified.  Later because patient left with PICC line in place social worker assisted in calling [employer]  They went to patient and boyfriend's last known address but they were not there.  Unfortunately unable to locate patient in an attempt to get her to return briefly to the hospital to remove PICC line.  My attending physician was notified of above.  Both of [place] have discussed with the patient on multiple occasions that it was not appropriate to leave [place] or to use illicit drugs.  We also emphasized not tampering with the PICC line due to risk of overdose, sepsis or death from drugs and each time she verbalized understanding.  Hospital Course:  Acute problems: Severe sepsis due to MRSA bacteremia/ -Complicated by septic emboli to both lungs and kidneys, right shoulder possibly septic left ankle (MRI refused by patient). She also has a right subdeltoid abscess. Left ankle improving, no further issues, no pain -ID,  cardiothoracic surgery, orthopedic surgery following -Status post incision and drainage of right shoulder subdeltoid abscess with multiple cavities on 3/5 by Dr. Lucia Gaskins. Drain dc'd by ortho -Continue daptomycin until date of discharge.  Plan discharge date is  3/31 to complete 2 weeks of oral linezolid.   -CK has remained stable on daptomycin  -Last blood cultures negative on 12/10/2020, PICC line placed 2/28.  Tricuspid valve endocarditis -Cardiothoracic surgery evaluated patient early in the hospitalization for possible angio VAC debridement.  -Echocardiogram on 3/14 revealed persistent tricuspid valve endocarditis but due to location not amenable to angio vac.  Dr. Kipp Brood recommends following up in the outpatient setting once patient "clean" to discuss surgical intervention.  She is hemodynamically stable without any evidence of acute heart failure. -Recent chest x-ray unremarkable no evidence of heart failure.  BNP stable in the 450 range  Acute blood loss anemia, iron deficiency anemia with episode of symptomatic anemia -post operatively, received a total of 2U pRBC, one on 3/6 and one on 3/7 -Hemoglobin had drifted downward to 6.8 on 3/18 and patient was given 2 units of PRBCs with improvement of hemoglobin to 8.6 -Given IV Iron on 3/21 per attending.  Hemoglobin on 3/22 has increased from 8.6-10.6.  Creatinine stable at 1.26.  Sodium stable at 136 -Begin daily oral iron replacement with Senokot at bedtime.  Septic emboli to left ankle and right shoulder -Status post drainage of right subdeltoid abscess -Drain removed by orthopedic team on 3/10 with documentation that sutures to remain in place for an additional week -Continues to have pain in the right shoulder.  Encouraged to perform AROM frequently to avoid frozen shoulder -Repeatedly states wishes to know weaned off of methadone therefore I will begin ibuprofen 400 mg scheduled twice daily-using lower dose regimen given mildly  abnormal creatinine with a GFR of 53-57 recently -We will also add Voltaren gel 4 times daily to right shoulder  Migraine headache -Patient reports recent nausea secondary to migraine holocord of the weekend and normally talks Excedrin Migraine which has been ordered as of 3/14.  Fever -Has been having waxing and waning febrile state throughout the admission without any new sources of infection determined.  IV drug use w/ Heroin, cocaine use, marijuana, tobacco -cessation was encouraged.  -UDS was positive for cocaine.  -Hep C and HIV are negative. RPR is negative. -Is on methadone and she would like to continue this after discharge.  Did not wish to transition to Suboxone after discharge. -3/21 Attending spoke at length w/ patient and plan is to taper Suboxone prior to dc; as of 3/21 dose decr to 10 mg BID; current plan is to taper down every 5 days; next change will be 3/26 to 10 mg daily for an additional 5 days then likely will require 5 mg daily for 3 to 5 days after discharge -Methadone can interact with discharge antibiotic linezolid and cause QTC prolongation therefore patient will need to have EKG obtained prior to discharge -[facility] staff note has given patient contact information for MAT facilities in the area that she is responsible for contacting to arrange for outpatient follow-up  Acute hypoxic respiratory failure, narcotic withdrawal, bilateral pulmonary septic emboli -hypoxia resolved, remains on room air  Nonoliguric AKI with ATN -creatinine peaked at 2.8, fluctuating but overall stable, 1.4 today  Debility/deconditioning PT note 3/22 Pt independent in room and ambulating to the nurses station without assistance per unit staff. HEP provided on 3/18 and pt reports performing independently in room between therapy sessions. Endurance is mildly decreased however pt still able to ambulate ~550' without an AD or DOE this session. Encouraged ambulation in room once an  hour and a longer walk for exercise 2-3 times a day. Pt agreeable to physical therapy signing off at this time as acute PT goals are met. If needs change, please reconsult.  OT NOTE 3/22:    Per pt and staff, pt has been managing ADLs/mobility in room without AD and without assist including toileting, bathing, dressing. Pt denies any significant balance issues during mobility. Session focused on carryover of HEP with self limiting behaviors continually noted. Pt demo 2 UE HEP with 5 reps each and returned back to bed. Pt reports soreness after 10 reps of theraband exercises. Educated pt on alternating resistive exercises with AROM exercises to further progress shoulder ROM/strength within tolerance, as well as incorporating R UE use during daily tasks as pt is R-hand dominant. Advised pt that continued R shoulder exercise is needed to avoid stiffness and continued pain. Pt verbalized understanding and no further skilled OT services needed at acute level. OT to sign off. Please reconsult if needs change.     Moderate protein calorie malnutrition Nutrition Problem: Moderate Malnutrition Etiology: social / environmental circumstances (IVDU) Signs/Symptoms: mild fat depletion,moderate fat depletion,mild muscle depletion,moderate muscle depletion Interventions: Ensure Enlive (each supplement provides 350kcal and 20 grams of protein),MVI Estimated body mass index is 23 kg/m as calculated from the following:   Height as of this encounter: '5\' 7"'  (1.702 m).   Weight as of this encounter: 66.6 kg.   Wounds:     Incision (Closed) 12/06/20 Shoulder Right (Active)  Date First Assessed/Time First Assessed: 12/06/20 0914   Location: Shoulder  Location Orientation: Right    Assessments 12/06/2020  8:56 AM 12/24/2020  8:13 PM  Dressing Type Adhesive bandage None  Dressing Clean;Dry;Intact Clean;Dry;Intact  Site / Wound Assessment Dressing in place / Unable to assess Dry;Clean  Margins -- Attached  edges (approximated)  Closure -- Skin glue  Drainage Amount None None     No Linked orders to display     Other problems: Hypertension -continue current regimen  Acute cystitis without hematuria -MRSA in cultures, she is on antibiotics    Procedures:  2D echocardiogram  Anesthesia on 3/3  Irrigation and debridement right shoulder abscess on 3/5   Consultations: Infectious disease Cardiothoracic surgery Orthopedic surgery  Discharge Exam: Vitals:   12/29/20 0837 12/29/20 1255  BP: 134/79 122/70  Pulse: (!) 106 94  Resp: 16 16  Temp: (!) 100.6 F (38.1 C) 98.9 F (37.2 C)  SpO2:  100%   **Physical exam based on exam completed earlier in the day prior to elopement. Constitutional: Awakens, reporting nausea without abdominal pain Respiratory: Lungs clear to auscultation stable on room air Heart: No tachycardia normotensive grade 3/5 systolic murmur left sternal border fourth intercostal space.   Abdomen: LBM 3/27, tolerating diet well.  Abdomen unremarkable Musculoskeletal: No pain with AROM of right shoulder Neurologic: CN 2-12 grossly intact.  No focal neurological deficits and ambulates independently Psychiatric: Alert and oriented x3.  Affect appropriate and pleasant.  Discharge Instructions  **Discharge medications were completed prior to patient eloping.  This was done in anticipation of  planned discharge date on 3/31.  Please note that patient left before receiving any discharge instructions and definitely was not given any prescriptions since she eloped.  Allergies as of 12/29/2020   No Known Allergies     Medication List    TAKE these medications   acetaminophen 325 MG tablet Commonly known as: TYLENOL Take 2 tablets (650 mg total) by mouth every 4 (four) hours as needed for mild pain (or Fever >/= 101).   linezolid 600 MG tablet Commonly known as: ZYVOX Take 1 tablet (600 mg total) by mouth 2 (two) times daily.   methadone 10 MG  tablet Commonly known as: DOLOPHINE Take 1 tablet (10 mg total) by mouth every 8 (eight) hours as needed for severe pain.      No Known Allergies  Follow-up Information    [facility]. Go on 12/25/2020.   Why: You are scheduled for hospital follow up on 01/29/2021 at 1:50 pm at the clinic.  Please arrive 15 minutes early to the scheduled appointment. Contact information: [place] [address] [phone]       [facility] Follow up.   Why: Please call and schedule an initial appointment with the clinic - Call [phone].       Lajuana Matte, MD Follow up.   Specialty: Cardiothoracic Surgery Contact information: [place] [address] [phone]                The results of significant diagnostics from this hospitalization (including imaging, microbiology, ancillary and laboratory) are listed below for reference.    Significant Diagnostic Studies: MR SHOULDER RIGHT WO CONTRAST  Result Date: 12/04/2020 CLINICAL DATA:  Shoulder pain EXAM: MRI OF THE RIGHT SHOULDER WITHOUT CONTRAST TECHNIQUE: Multiplanar, multisequence MR imaging of the shoulder was performed. No intravenous contrast was administered. COMPARISON:  None. FINDINGS: Bones/Joint/Cartilage There is heterogeneous T2 hyperintense signal with T1 hypointensity seen at the lateral corner of the humeral head. No osseous fracture however seen. There is a small glenohumeral joint effusion with edema around the inferior capsule. There TICU are surface however appears to be maintained. Ligaments Suboptimally visualized Muscles and Tendons There is diffuse edema seen within the muscles surrounding the shoulder. Within the subcoracoid subdeltoid bursa there is multilocular complex fluid collection with scattered debris that appears to encompass the deltoid musculature and extend posteriorly. There  is significant increased signal seen throughout the deltoid musculature. Soft tissue Subcutaneous edema is seen surrounding the shoulder IMPRESSION: Findings which could be suggestive of osteomyelitis versus reactive marrow seen through the lateral corner of the humeral head. No osseous fracture. Multilocular complex collection seen within the subdeltoid recess extending around the deltoid musculature posteriorly which could be due to infectious/inflammatory bursitis Diffuse muscular edema/myositis surrounding the shoulder. Electronically Signed   By: Prudencio Pair M.D.   On: 12/04/2020 17:20   DG CHEST PORT 1 VIEW  Result Date: 12/10/2020 CLINICAL DATA:  Dyspnea EXAM: PORTABLE CHEST 1 VIEW COMPARISON:  11/25/2020 FINDINGS: Persistent bilateral consolidative and nodular pulmonary opacities with improved aeration compared to the prior study. Possible small left pleural effusion. No pneumothorax. Stable cardiomediastinal contours. Left PICC line tip overlies cavoatrial junction. IMPRESSION: Persistent bilateral pulmonary opacities with improvement in aeration. Electronically Signed   By: Macy Mis M.D.   On: 12/10/2020 15:55   DG Abd Portable 1V  Result Date: 12/01/2020 CLINICAL DATA:  Abdominal pain. EXAM: PORTABLE ABDOMEN -  1 VIEW COMPARISON:  CT of the abdomen and pelvis on 11/15/2020 FINDINGS: No evidence of bowel obstruction, ileus or signs of free air. No abnormal calcifications. Feeding tube is present with the tip located in the expected position of the distal stomach/proximal duodenum. IMPRESSION: No acute findings. The tip of the feeding tube is in the expected position of the distal stomach/proximal duodenum. Electronically Signed   By: Aletta Edouard M.D.   On: 12/01/2020 09:18   ECHOCARDIOGRAM LIMITED  Result Date: 12/15/2020    ECHOCARDIOGRAM LIMITED REPORT   Patient Name:   Anne Bautista Date of Exam: 12/15/2020 Medical Rec #:  [id number]   Height:       67.0 in Accession #:    [id number]   Weight:       165.8 lb Date of Birth:  Jun 06, 1986    BSA:          1.867 m Patient Age:    56 years    BP:           132 /71 mmHg Patient Gender: F           HR:           93 bpm. Exam Location:  Inpatient Procedure: Limited Echo, Limited Color Doppler and Color Doppler Indications:    Endocarditis  History:        Patient has prior history of Echocardiogram examinations, most                 recent 11/17/2020. Signs/Symptoms:vegetation.  Sonographer:    Luisa Hart RDCS Referring Phys: [id number] Leisa Lenz Renate Danh  Sonographer Comments: Technically challenging study due to limited acoustic windows. IMPRESSIONS  1. Left ventricular ejection fraction, by estimation, is 60 to 65%. The left ventricle has normal function.  2. There is a large mobile density on the Tricuspid valve -c/w endocarditis . Suggest TEE if clinically indicated. The study is not significantly changed from the previous study .     Marland Kitchen Tricuspid valve regurgitation is moderate to severe. FINDINGS  Left Ventricle: Left ventricular ejection fraction, by estimation, is 60 to 65%. The left ventricle has normal function. Pericardium: There is no evidence of pericardial effusion. Tricuspid Valve: There is a large mobile density on the Tricuspid valve -c/w endocarditis . Suggest TEE if clinically indicated. The study is not significantly changed from the previous study. Tricuspid valve regurgitation is moderate to severe.  LV Volumes (MOD) LV vol d, MOD A2C: 91.3 ml LV vol d, MOD A4C: 85.1 ml LV vol s, MOD A2C: 50.4 ml LV vol s, MOD A4C: 29.5 ml LV SV MOD A2C:     40.9 ml LV SV MOD A4C:     85.1 ml LV SV MOD BP:      50.4 ml LEFT ATRIUM           Index       RIGHT ATRIUM           Index LA Vol (A2C): 44.4 ml 23.78 ml/m RA Area:     17.00 cm                                   RA Volume:   47.70 ml  25.55 ml/m  TRICUSPID VALVE TR Peak grad:   14.4 mmHg TR Vmax:        190.00 cm/s Mertie Moores MD Electronically signed by Mertie Moores MD Signature Date/Time:  12/15/2020/5:13:49  PM    Final    [place] EKG SITE RITE  Result Date: 12/01/2020 If [facility] image not attached, placement could not be confirmed due to current cardiac rhythm.   Microbiology: No results found for this or any previous visit (from the past 240 hour(s)).   Labs: Basic Metabolic Panel: Recent Labs  Lab 12/27/20 0707 12/29/20 1044  NA 135 131*  K 4.0 3.6  CL 104 100  CO2 24 22  GLUCOSE 104* 115*  BUN 13 10  CREATININE 1.25* 1.20*  CALCIUM 9.1 9.1   Liver Function Tests: Recent Labs  Lab 12/27/20 0707  AST 26  ALT 24  ALKPHOS 163*  BILITOT 0.4  PROT 8.9*  ALBUMIN 2.2*   No results for input(s): LIPASE, AMYLASE in the last 168 hours. No results for input(s): AMMONIA in the last 168 hours. CBC: Recent Labs  Lab 12/27/20 0707  WBC 8.3  NEUTROABS 4.9  HGB 9.2*  HCT 28.5*  MCV 84.3  PLT 266   Cardiac Enzymes: Recent Labs  Lab 12/29/20 1044  CKTOTAL 10*   BNP: BNP (last 3 results) Recent Labs    11/27/20 0430 11/28/20 0505 12/10/20 1600  BNP 670.1* 455.5* 458.5*    ProBNP (last 3 results) No results for input(s): PROBNP in the last 8760 hours.  CBG: No results for input(s): GLUCAP in the last 168 hours.     Signed:  Erin Hearing ANP [facility] 12/30/2020, 7:31 AM

## 2021-01-28 ENCOUNTER — Other Ambulatory Visit: Payer: Self-pay | Admitting: Thoracic Surgery (Cardiothoracic Vascular Surgery)

## 2021-01-28 DIAGNOSIS — I079 Rheumatic tricuspid valve disease, unspecified: Secondary | ICD-10-CM

## 2021-01-28 NOTE — Progress Notes (Deleted)
Patient ID: Anne Bautista, female   DOB: 1986/04/25, 35 y.o.   MRN: [id number]   After hospitalization 2/12-3/29/2022 and leaving AMA.  From discharge summary: Recommendations for Outpatient Follow-up:  1. Patient left AMA/eloped therefore no formal discharge recommendations or instructions given to the patient. 2. Patient was scheduled to complete her daptomycin IV on 3/31 and this was to be followed by linezolid 600 twice daily for 14 days.  If patient represents to the hospital for admission please keep this in mind when developing a plan of care.   Discharge Diagnoses:  Principal Problem:   Endocarditis of tricuspid valve Active Problems:   Severe sepsis due to methicillin resistant Staphylococcus aureus (MRSA) with acute organ dysfunction (HCC)   Poor dentition   Malnutrition (HCC)   Thrombocytopenia (HCC)   Tobacco dependence   Acute cystitis without hematuria   Malnutrition of moderate degree   Abdominal pain   Left foot pain   Septic embolism (HCC)   Staphylococcal arthritis of right shoulder (HCC)   MRSA bacteremia   Acute on chronic respiratory failure with hypoxia (HCC)   Sepsis (HCC)   Iron deficiency anemia  History of present illness:  35 year old female with history of IV drug use, tobacco use, depression came to the hospital with myalgias and cough, he was septic on arrival admitted to the ICU. She was found to have MRSA bacteremia, tricuspid valve endocarditis as well as septic emboli to lungs, kidneys. Infectious disease and cardiothoracic surgery consulted. Hospital course complicated by worsening right shoulder pain found to have septic arthritis  Patient remained clinically stable and was having intermittent issues of nocturnal fevers with follow-up cultures otherwise negative including blood and urine cultures.  As of date of elopement plan was to continue IV daptomycin through 3/31 and patient was to receive a dose of IV oritavancin prior to discharge.  This  was to be followed by 2 weeks of twice daily linezolid.  On date of elopement patient had been complaining of nausea without abdominal pain or vomiting.  Because she had spiked a low-grade fever during the night blood cultures, urinalysis and culture were ordered but patient refused.  Patient was last assessed on the nursing unit at 1545 and she was resting calmly and cooperative.  CNA went to the patient's room around 1700 to check vitals and room was empty.  It appears patient left the unit without notifying staff.  Unfortunately PICC line remains in place.  Please see nursing notes regarding documentation of notification of appropriate supervisory staff.  Security was also notified.  Later because patient left with PICC line in place social worker assisted in calling [employer]  They went to patient and boyfriend's last known address but they were not there.  Unfortunately unable to locate patient in an attempt to get her to return briefly to the hospital to remove PICC line.  My attending physician was notified of above.  Both of [place] have discussed with the patient on multiple occasions that it was not appropriate to leave [place] or to use illicit drugs.  We also emphasized not tampering with the PICC line due to risk of overdose, sepsis or death from drugs and each time she verbalized understanding.  Hospital Course:  Acute problems: Severe sepsis due to MRSA bacteremia/ -Complicated by septic emboli to both lungs and kidneys, right shoulder possibly septic left ankle (MRI refused by patient). She also has a right subdeltoid abscess. Left ankle improving, no further issues, no pain -ID,  cardiothoracic surgery, orthopedic surgery following -Status post incision and drainage of right shoulder subdeltoid abscess with multiple cavities on 3/5 by Dr. Lucia Gaskins. Drain dc'd by ortho -Continue daptomycin until date of discharge. Plan discharge date is3/31 to complete 2 weeks  of oral linezolid.  -CK has remained stable on daptomycin  -Last blood cultures negative on 12/10/2020, PICC line placed 2/28.  Tricuspid valve endocarditis -Cardiothoracic surgery evaluated patient early in the hospitalization for possible angio VAC debridement.  -Echocardiogram on 3/14 revealed persistent tricuspid valve endocarditis but due to location not amenable to angio vac. Dr. Kipp Brood recommends following up in the outpatient setting once patient "clean" to discuss surgical intervention. She is hemodynamically stable without any evidence of acute heart failure. -Recent chest x-ray unremarkable no evidence of heart failure. BNP stable in the 450 range  Acute blood loss anemia, iron deficiency anemiawith episode of symptomatic anemia -post operatively, received a total of 2U pRBC, one on 3/6 and one on 3/7 -Hemoglobin had drifted downward to 6.8 on 3/18 and patient was given 2 units of PRBCs with improvement of hemoglobin to 8.6 -Given IV Iron on 3/21 per attending. Hemoglobin on 3/22 has increased from 8.6-10.6. Creatinine stable at 1.26. Sodium stable at 136 -Begin daily oral iron replacement with Senokot at bedtime.  Septic emboli to left ankle and right shoulder -Status post drainage of right subdeltoid abscess -Drain removed by orthopedic team on 3/10 with documentation that sutures to remain in place for an additional week -Continues to have pain in the right shoulder. Encouraged to perform AROM frequently to avoid frozen shoulder -Repeatedly states wishes to know weaned off of methadone therefore I will begin ibuprofen 400 mg scheduled twice daily-using lower dose regimen given mildly abnormal creatinine with a GFR of 53-57 recently -We will also add Voltaren gel 4 times daily to right shoulder  Migraine headache -Patient reports recent nausea secondary to migraine holocord of the weekend and normally talks Excedrin Migraine which has been ordered as of  3/14.  Fever -Has been having waxing and waning febrile state throughout the admission without any new sources of infection determined.  IV drug use w/ Heroin, cocaine use, marijuana, tobacco -cessation was encouraged.  -UDS was positive for cocaine.  -Hep C and HIV are negative. RPR is negative. -Is on methadone and she would like to continue this after discharge. Did not wish to transition to Suboxone after discharge. -3/21 Attending spoke at length w/ patient and plan is to taper Suboxone prior to dc; as of 3/21 dose decr to 10 mg BID; current plan is to taper down every 5 days; next change will be 3/26to 10 mg daily for an additional 5 days then likely will require 5 mg daily for 3 to 5 days after discharge -Methadone can interact with discharge antibiotic linezolid and cause QTC prolongation therefore patient will need to have EKG obtained prior to discharge -[facility] staff note has given patient contact information for MAT facilities in the area that she is responsible for contacting to arrange for outpatient follow-up  Acute hypoxic respiratory failure, narcotic withdrawal, bilateral pulmonary septic emboli -hypoxia resolved, remains on room air   Nonoliguric AKI with ATN -creatinine peaked at 2.8, fluctuating but overall stable, 1.4 today  Debility/deconditioning PT note 3/22 Pt independent in room and ambulating to the nurses station without assistance per unit staff. HEP provided on 3/18 and pt reports performing independently in room between therapy sessions. Endurance is mildly decreased however pt still able to ambulate ~550' without  an AD or DOE this session. Encouraged ambulation in room once an hour and a longer walk for exercise 2-3 times a day. Pt agreeable to physical therapy signing off at this time as acute PT goals are met. If needs change, please reconsult.

## 2021-01-29 ENCOUNTER — Inpatient Hospital Stay: Payer: Self-pay | Admitting: Physician Assistant

## 2021-01-30 ENCOUNTER — Ambulatory Visit: Payer: Self-pay | Admitting: Thoracic Surgery (Cardiothoracic Vascular Surgery)

## 2021-02-20 ENCOUNTER — Ambulatory Visit: Payer: Self-pay | Admitting: Thoracic Surgery (Cardiothoracic Vascular Surgery)

## 2021-03-27 ENCOUNTER — Ambulatory Visit: Payer: Self-pay | Admitting: Thoracic Surgery (Cardiothoracic Vascular Surgery)

## 2021-05-04 DEATH — deceased

## 2021-10-15 IMAGING — DX DG CHEST 1V PORT
1 series · 1 of 1 positions shown · non-contrast
Comparison: 11/20/2020

CLINICAL DATA: Pneumonia.

EXAM:
PORTABLE CHEST 1 VIEW

[chest ap]
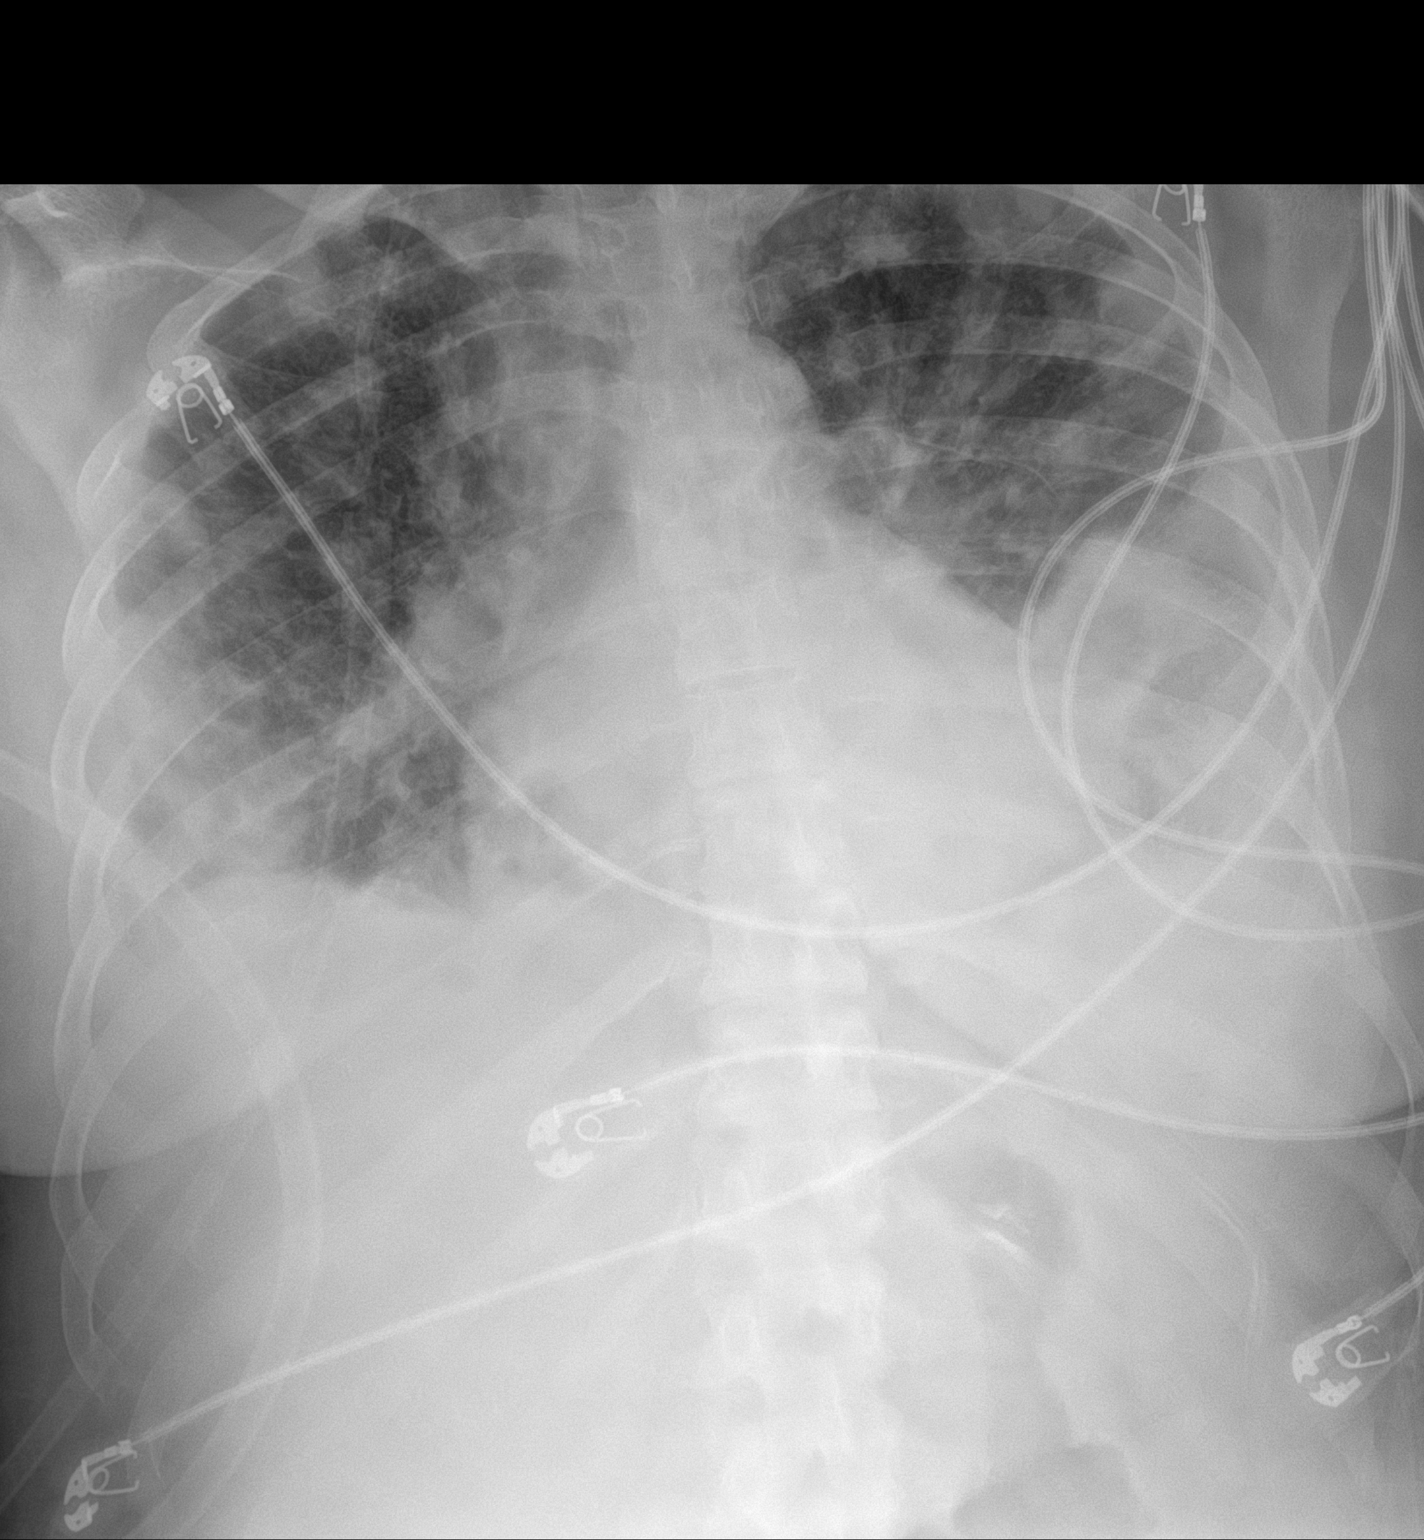

[1 of 1 positions shown; findings below may reference images not displayed]

FINDINGS: Cardiomediastinal silhouette is unchanged.

Diffuse bilateral nodular opacities within both lungs are not
significantly changed.

Slightly increased bilateral LOWER lung consolidation/atelectasis
noted.

Trace bilateral pleural effusions are noted.

There is no evidence of pneumothorax or acute bony abnormality.
IMPRESSION: 1. Slightly increased bilateral LOWER lung consolidation/atelectasis
and trace bilateral pleural effusions.
2. Unchanged diffuse bilateral nodular opacities suspicious for
septic emboli.

## 2021-10-18 IMAGING — US US EXTREM LOW*L* COMPLETE
1 series · 8 of 8 positions shown · non-contrast
Comparison: None.

CLINICAL DATA: Anterior left foot, possible effusion, septic
arthritis

EXAM:
LEFT LOWER EXTREMITY SOFT TISSUE ULTRASOUND COMPLETE
TECHNIQUE: Ultrasound examination was performed including evaluation of the
muscles, tendons, joint, and adjacent soft tissues.

[Series 1: us complete joint space structures low left · 8 of 8 slices shown]
[im 1/8]
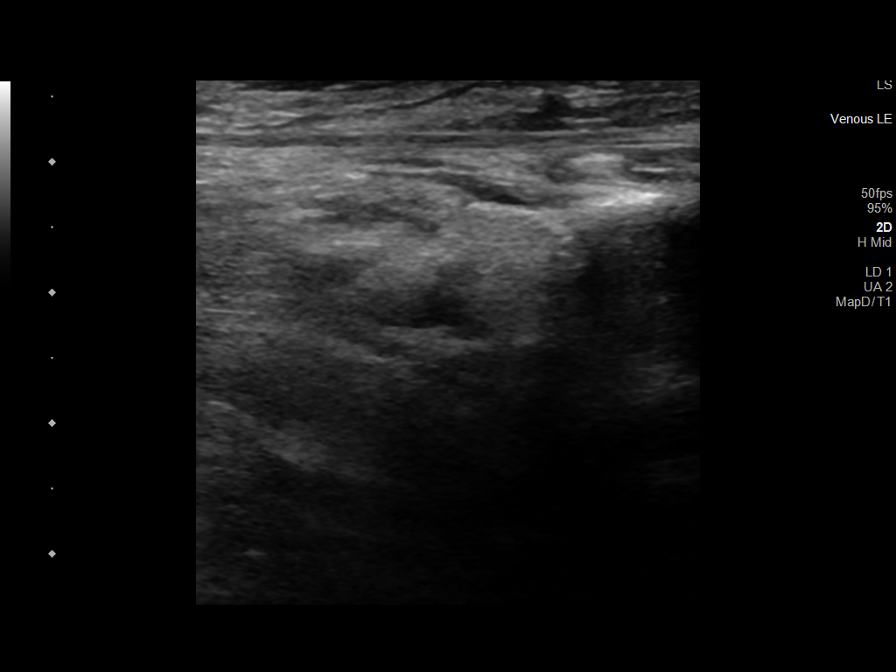
[im 2/8]
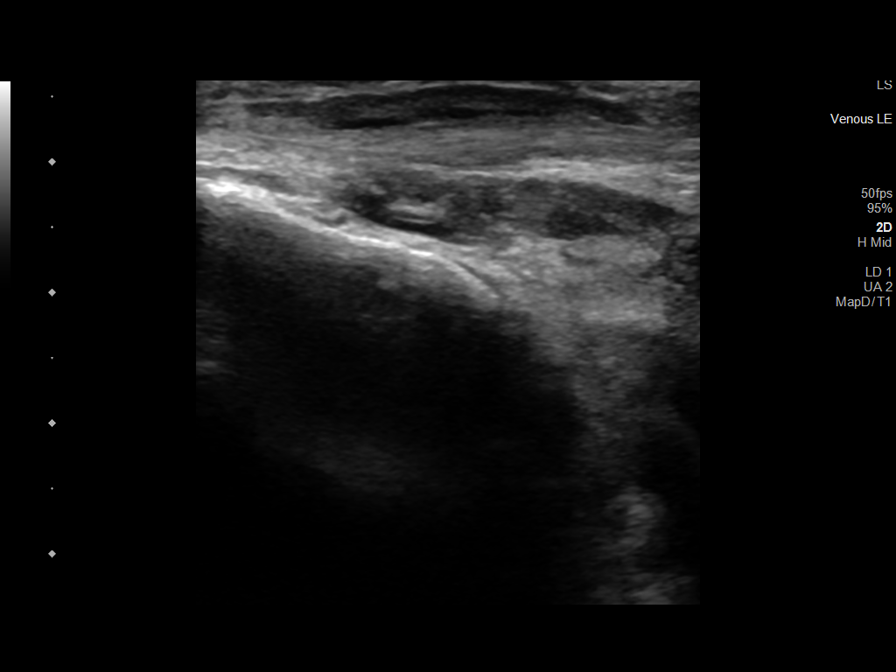
[im 3/8]
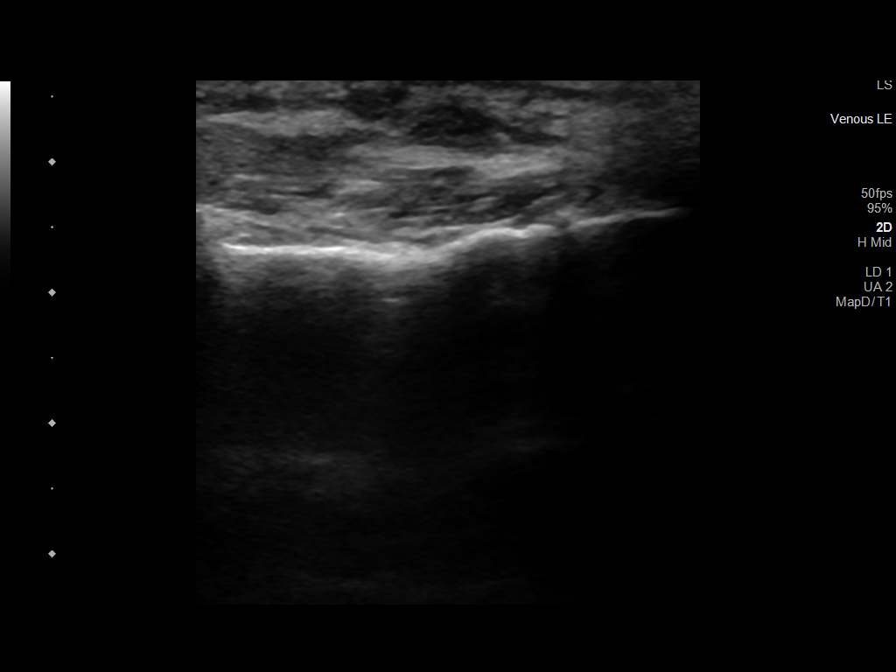
[im 4/8]
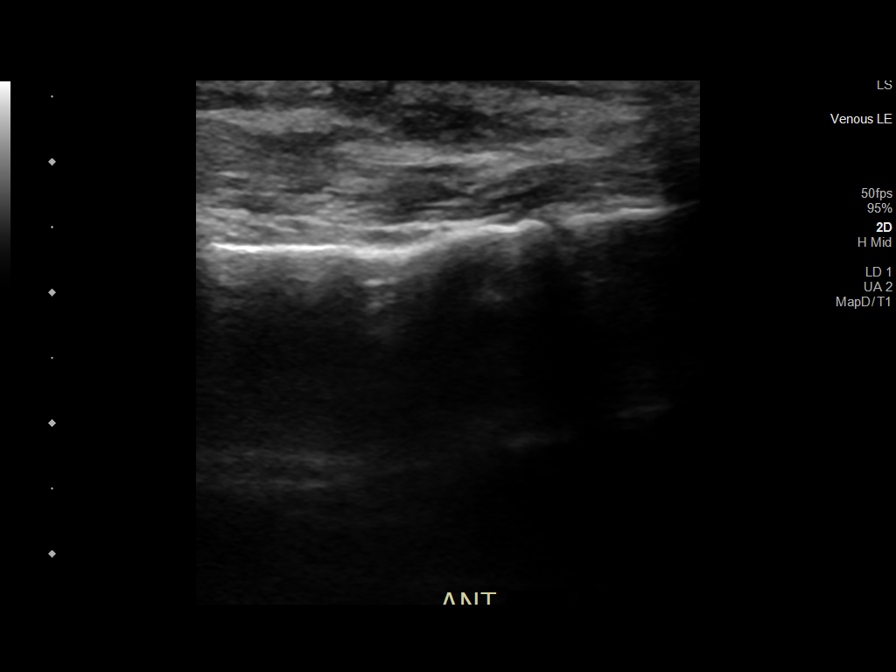
[im 5/8]
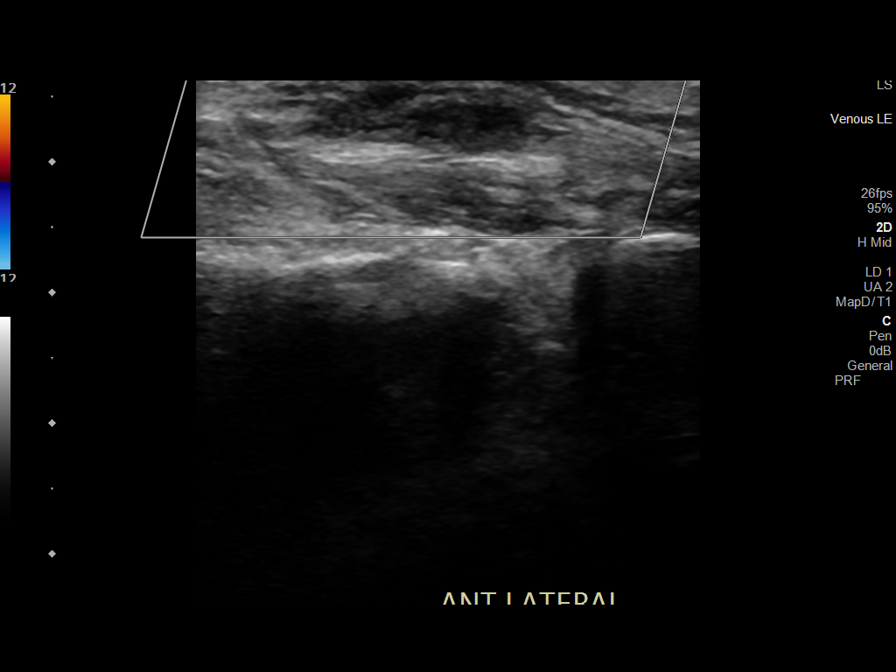
[im 6/8]
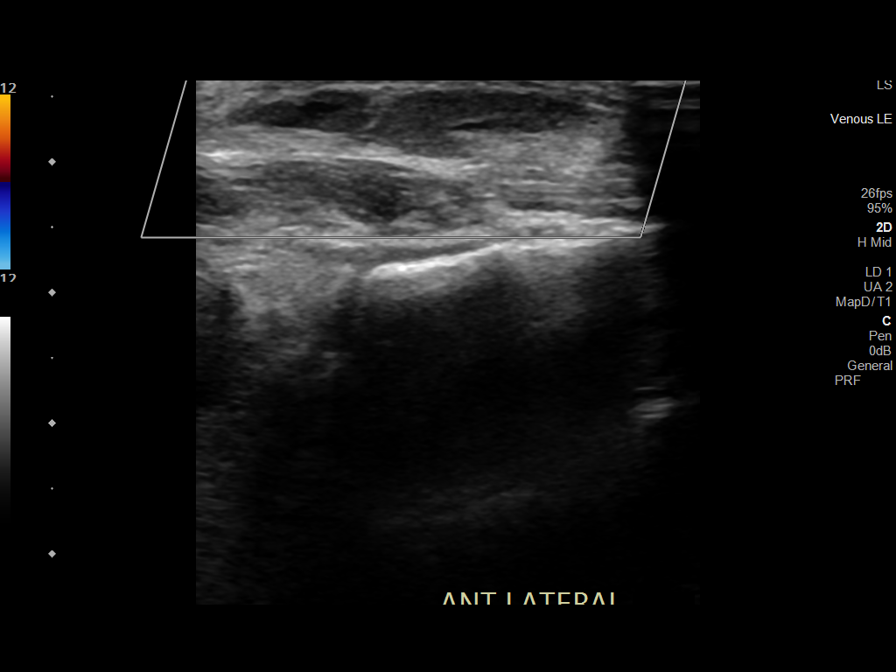
[im 7/8]
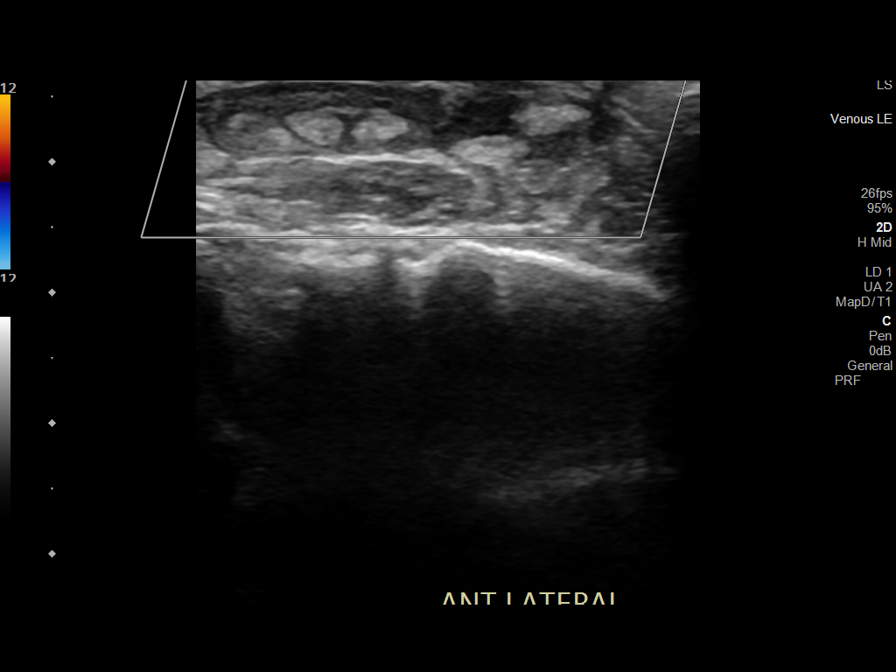
[im 8/8]
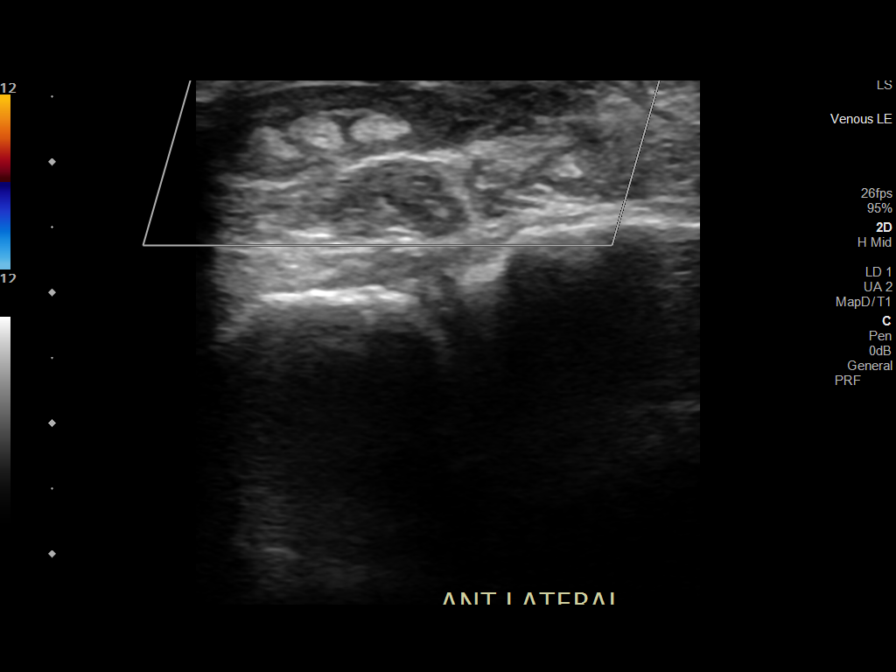

[8 of 8 positions shown; findings below may reference images not displayed]

FINDINGS: Targeted grayscale and color Doppler ultrasound evaluation of the
indicated area of concern along the anterolateral left foot at a
site of focal swelling and erythema.

There is some mild phlegmonous changes centered in the subcutaneous
tissues along the anterolateral left foot. Mild skin thickening is
present as well. No focal organized collection is seen. Changes
appear confined to the fascial layer superficial to the tendons and
musculature. Few imaged joint spaces are free of sizable effusion or
fluid distension.
IMPRESSION: Mild phlegmonous changes in skin thickening along the anterolateral
left foot at a site of focal swelling and erythema. No organized
fluid collection.

## 2021-10-23 IMAGING — DX DG ABD PORTABLE 1V
1 series · 1 of 1 positions shown · non-contrast
Comparison: CT of the abdomen and pelvis on 11/15/2020

CLINICAL DATA: Abdominal pain.

EXAM:
PORTABLE ABDOMEN - 1 VIEW

[abdomen kub]
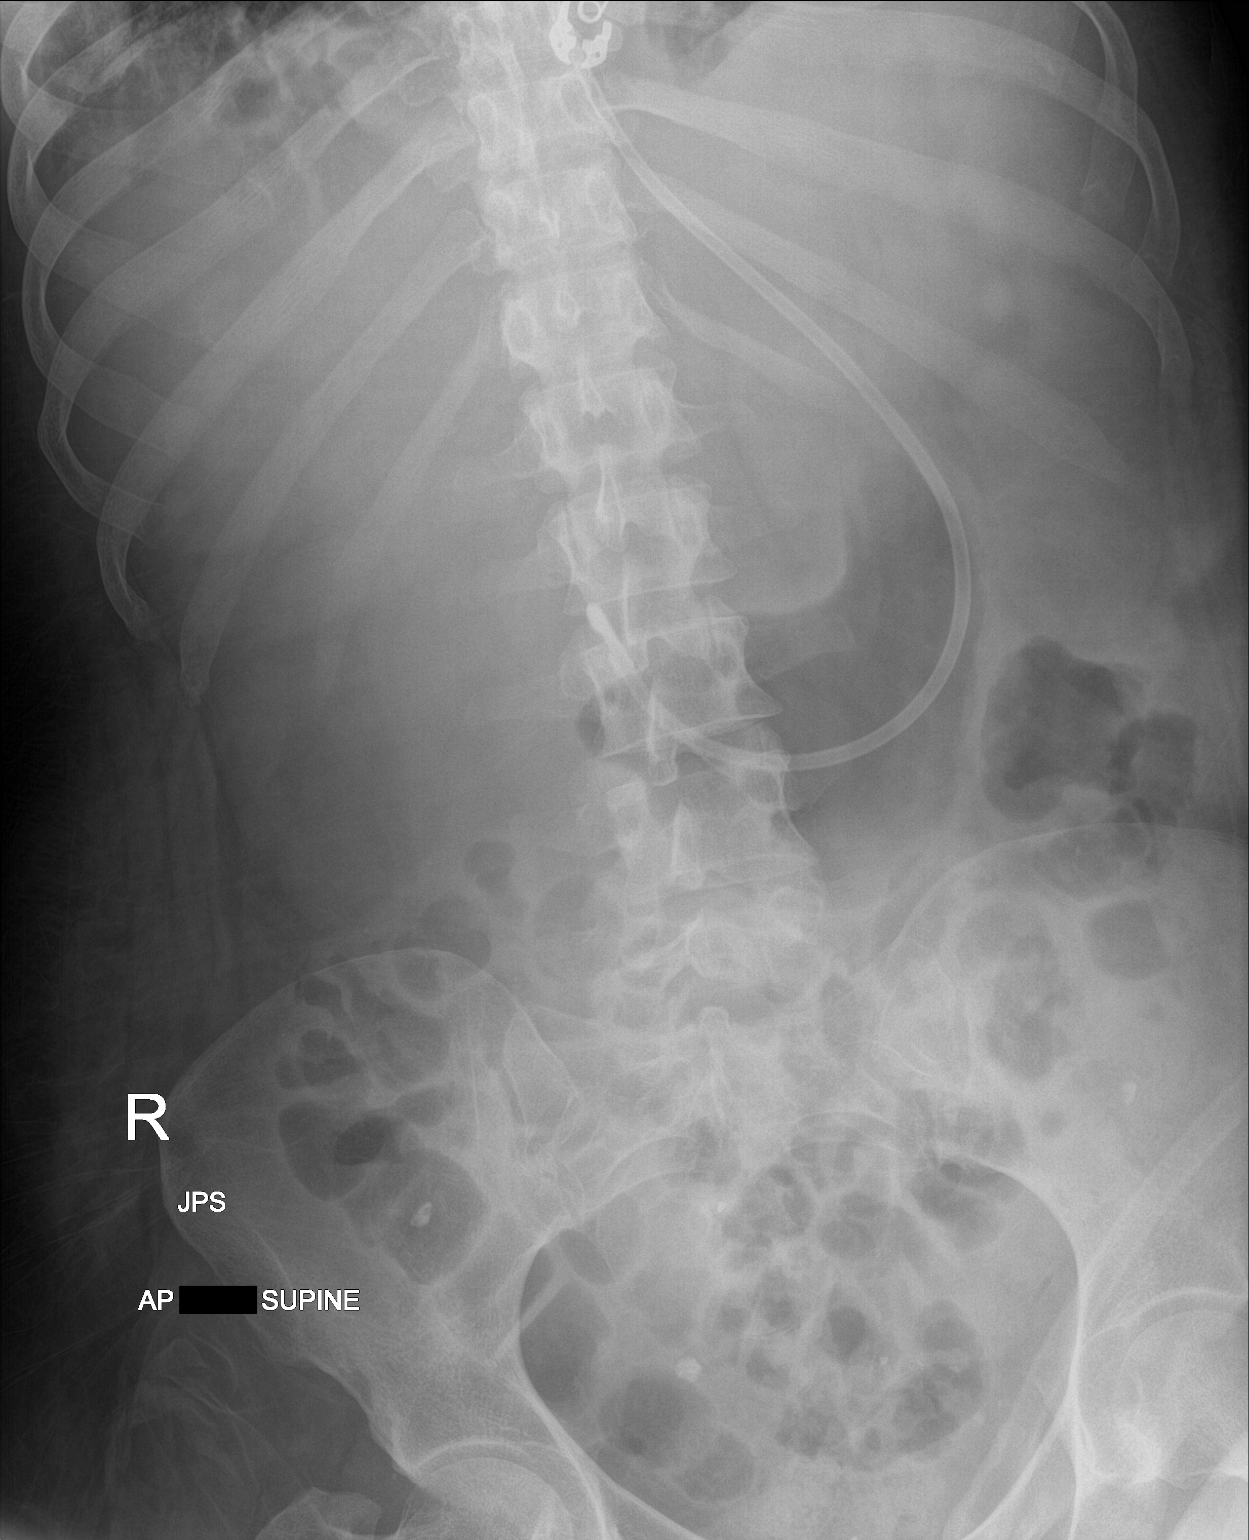

[1 of 1 positions shown; findings below may reference images not displayed]

FINDINGS: No evidence of bowel obstruction, ileus or signs of free air. No
abnormal calcifications. Feeding tube is present with the tip
located in the expected position of the distal stomach/proximal
duodenum.
IMPRESSION: No acute findings. The tip of the feeding tube is in the expected
position of the distal stomach/proximal duodenum.
